# Patient Record
Sex: Male | Born: 1955 | Race: White | Hispanic: No | Marital: Married | Smoking: Current every day smoker
Health system: Southern US, Community
[De-identification: ages and names within clinical notes are randomized; demographics above are authoritative.]

## PROBLEM LIST (undated history)

## (undated) DIAGNOSIS — J45909 Unspecified asthma, uncomplicated: Secondary | ICD-10-CM

## (undated) DIAGNOSIS — G8929 Other chronic pain: Secondary | ICD-10-CM

## (undated) DIAGNOSIS — K219 Gastro-esophageal reflux disease without esophagitis: Secondary | ICD-10-CM

## (undated) DIAGNOSIS — I480 Paroxysmal atrial fibrillation: Secondary | ICD-10-CM

## (undated) DIAGNOSIS — R490 Dysphonia: Secondary | ICD-10-CM

## (undated) DIAGNOSIS — J449 Chronic obstructive pulmonary disease, unspecified: Secondary | ICD-10-CM

## (undated) DIAGNOSIS — D735 Infarction of spleen: Secondary | ICD-10-CM

## (undated) DIAGNOSIS — N433 Hydrocele, unspecified: Secondary | ICD-10-CM

## (undated) DIAGNOSIS — R001 Bradycardia, unspecified: Secondary | ICD-10-CM

## (undated) DIAGNOSIS — K635 Polyp of colon: Secondary | ICD-10-CM

## (undated) DIAGNOSIS — I236 Thrombosis of atrium, auricular appendage, and ventricle as current complications following acute myocardial infarction: Secondary | ICD-10-CM

## (undated) DIAGNOSIS — I2109 ST elevation (STEMI) myocardial infarction involving other coronary artery of anterior wall: Secondary | ICD-10-CM

## (undated) DIAGNOSIS — E785 Hyperlipidemia, unspecified: Secondary | ICD-10-CM

## (undated) DIAGNOSIS — L821 Other seborrheic keratosis: Secondary | ICD-10-CM

## (undated) DIAGNOSIS — K2270 Barrett's esophagus without dysplasia: Secondary | ICD-10-CM

## (undated) DIAGNOSIS — I5020 Unspecified systolic (congestive) heart failure: Secondary | ICD-10-CM

## (undated) DIAGNOSIS — R251 Tremor, unspecified: Secondary | ICD-10-CM

## (undated) DIAGNOSIS — I2510 Atherosclerotic heart disease of native coronary artery without angina pectoris: Secondary | ICD-10-CM

## (undated) DIAGNOSIS — I4891 Unspecified atrial fibrillation: Secondary | ICD-10-CM

## (undated) DIAGNOSIS — M791 Myalgia, unspecified site: Secondary | ICD-10-CM

## (undated) DIAGNOSIS — I739 Peripheral vascular disease, unspecified: Secondary | ICD-10-CM

## (undated) DIAGNOSIS — M5136 Other intervertebral disc degeneration, lumbar region: Secondary | ICD-10-CM

## (undated) DIAGNOSIS — M549 Dorsalgia, unspecified: Secondary | ICD-10-CM

## (undated) DIAGNOSIS — F341 Dysthymic disorder: Secondary | ICD-10-CM

## (undated) DIAGNOSIS — M1990 Unspecified osteoarthritis, unspecified site: Secondary | ICD-10-CM

## (undated) DIAGNOSIS — M51369 Other intervertebral disc degeneration, lumbar region without mention of lumbar back pain or lower extremity pain: Secondary | ICD-10-CM

## (undated) DIAGNOSIS — L570 Actinic keratosis: Secondary | ICD-10-CM

## (undated) DIAGNOSIS — F17200 Nicotine dependence, unspecified, uncomplicated: Secondary | ICD-10-CM

## (undated) DIAGNOSIS — I499 Cardiac arrhythmia, unspecified: Secondary | ICD-10-CM

## (undated) DIAGNOSIS — I255 Ischemic cardiomyopathy: Secondary | ICD-10-CM

## (undated) HISTORY — DX: Peripheral vascular disease, unspecified: I73.9

## (undated) HISTORY — DX: Unspecified systolic (congestive) heart failure: I50.20

## (undated) HISTORY — DX: Infarction of spleen: D73.5

## (undated) HISTORY — DX: Dorsalgia, unspecified: M54.9

## (undated) HISTORY — DX: Actinic keratosis: L57.0

## (undated) HISTORY — DX: Other intervertebral disc degeneration, lumbar region without mention of lumbar back pain or lower extremity pain: M51.369

## (undated) HISTORY — PX: COLONOSCOPY: SHX174

## (undated) HISTORY — DX: Atherosclerotic heart disease of native coronary artery without angina pectoris: I25.10

## (undated) HISTORY — DX: Ischemic cardiomyopathy: I25.5

## (undated) HISTORY — DX: Myalgia, unspecified site: M79.10

## (undated) HISTORY — DX: Hyperlipidemia, unspecified: E78.5

## (undated) HISTORY — DX: Barrett's esophagus without dysplasia: K22.70

## (undated) HISTORY — DX: Dysphonia: R49.0

## (undated) HISTORY — DX: Polyp of colon: K63.5

## (undated) HISTORY — DX: Hydrocele, unspecified: N43.3

## (undated) HISTORY — DX: Nicotine dependence, unspecified, uncomplicated: F17.200

## (undated) HISTORY — DX: Tremor, unspecified: R25.1

## (undated) HISTORY — DX: Unspecified asthma, uncomplicated: J45.909

## (undated) HISTORY — DX: Chronic obstructive pulmonary disease, unspecified: J44.9

## (undated) HISTORY — PX: NASAL SINUS SURGERY: SHX719

## (undated) HISTORY — DX: Dysthymic disorder: F34.1

## (undated) HISTORY — DX: Other seborrheic keratosis: L82.1

## (undated) HISTORY — DX: Gastro-esophageal reflux disease without esophagitis: K21.9

## (undated) HISTORY — DX: Other chronic pain: G89.29

## (undated) HISTORY — DX: Unspecified atrial fibrillation: I48.91

## (undated) HISTORY — DX: Other intervertebral disc degeneration, lumbar region: M51.36

## (undated) HISTORY — DX: Bradycardia, unspecified: R00.1

## (undated) HISTORY — PX: AORTOILIAC BYPASS: SHX6417

## (undated) HISTORY — DX: Unspecified osteoarthritis, unspecified site: M19.90

---
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
## 2005-04-07 ENCOUNTER — Emergency Department: Payer: Self-pay | Admitting: Unknown Physician Specialty

## 2017-01-17 HISTORY — PX: SPLENECTOMY: SUR1306

## 2017-02-06 DIAGNOSIS — R578 Other shock: Secondary | ICD-10-CM | POA: Insufficient documentation

## 2017-02-06 DIAGNOSIS — E872 Acidosis, unspecified: Secondary | ICD-10-CM | POA: Insufficient documentation

## 2017-02-08 DIAGNOSIS — S3609XA Other injury of spleen, initial encounter: Secondary | ICD-10-CM | POA: Insufficient documentation

## 2017-05-20 ENCOUNTER — Encounter (INDEPENDENT_AMBULATORY_CARE_PROVIDER_SITE_OTHER): Payer: Self-pay

## 2017-05-20 ENCOUNTER — Encounter: Payer: Self-pay | Admitting: Gastroenterology

## 2017-05-20 ENCOUNTER — Ambulatory Visit (INDEPENDENT_AMBULATORY_CARE_PROVIDER_SITE_OTHER): Payer: Non-veteran care | Admitting: Gastroenterology

## 2017-05-20 ENCOUNTER — Other Ambulatory Visit: Payer: Self-pay

## 2017-05-20 VITALS — BP 113/62 | HR 73 | Temp 98.7°F | Ht 75.0 in | Wt 178.4 lb

## 2017-05-20 DIAGNOSIS — R1013 Epigastric pain: Secondary | ICD-10-CM | POA: Diagnosis not present

## 2017-05-20 NOTE — Progress Notes (Addendum)
[name], MD [address], [place], [place], [place], [address] [address], [place], [place], [place], [address] Phone: [phone]  Fax: [phone]  Consultation  Referring Provider:     No ref. provider found Primary Care Physician:  [facility] [place] Primary Gastroenterologist:  Dr. Bonna Gains         Reason for Referral:     Abdominal Pain  Date of Consultation:  05/20/2017         HPI:   Brian Mcclure is a 61 y.o. male presents with abdominal pain of 4 month duration. It is located in the midpepigastric region, 3/10 in severity (8/10 when admitted to the hospital in Aug), dull in quality. Pt. 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 The pain was severe just prior to that admission but now he describes more of a dyspepsia intermittently with large meals. He takes one zantac at night that helps. No N/V, weight loss, melena, hematochezia, dysphagia and no alarm symptoms. 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 Had a colonoscopy 11 yrs ago that he states was normal. Reports a brother diagnosed with rectal or anal cancer.   Past Medical History:  Diagnosis Date  . Actinic keratosis   . Atrial fibrillation ([place])   . Chronic back pain   . COPD (chronic obstructive pulmonary disease) ([place])   . Degeneration of lumbar intervertebral disc   . Dysphonia   . Dysthymia   . GERD (gastroesophageal reflux disease)   . Hydrocele   . Hyperlipidemia   . Myalgia   . Osteoarthritis    C-spine  . Peripheral vascular disease ([place])   . Reactive airway disease   . Seborrheic keratosis   . Tobacco use disorder   . Tremor     History reviewed. No pertinent surgical history.  Prior to Admission medications   Medication Sig Start Date End Date Taking? Authorizing Provider  Cholecalciferol  (VITAMIN D-3) 1000 units CAPS Take by mouth 2 (two) times daily.   Yes [provider]  metoprolol tartrate (LOPRESSOR) 25 MG tablet Take 25 mg by mouth 2 (two) times daily.   Yes [provider]  varenicline (CHANTIX) 1 MG tablet Take 1 mg by mouth 2 (two) times daily.   Yes [provider]  acetaminophen (TYLENOL) 325 MG tablet Take 650 mg by mouth every 8 (eight) hours as needed.    [provider]  flecainide (TAMBOCOR) 100 MG tablet Take 100 mg by mouth 2 (two) times daily.    [provider]  gabapentin (NEURONTIN) 100 MG capsule Take 100 mg by mouth 3 (three) times daily.    [provider]  pravastatin (PRAVACHOL) 40 MG tablet Take 40 mg by mouth daily.    [provider]  ranitidine (ZANTAC) 150 MG tablet Take 150 mg by mouth 2 (two) times daily.    [provider]  rosuvastatin (CRESTOR) 40 MG tablet Take 40 mg by mouth daily.    [provider]    History reviewed. No pertinent family history.   Social History  Substance Use Topics  . Smoking status: Current Every Day Smoker    Packs/day: 1.00    Types: Cigarettes  . Smokeless tobacco: Never Used  . Alcohol use Yes     Comment: rare  Allergies as of 05/20/2017 - Review Complete 05/20/2017  Allergen Reaction Noted  . Atorvastatin  05/20/2017  . Flunisolide  05/20/2017  . Nicotine polacrilex [nicotine]  05/20/2017  . Pravastatin  05/20/2017  . Wellbutrin [bupropion]  05/20/2017    Review of Systems:    All systems reviewed and negative except where noted in HPI.   Physical Exam:  Vital signs in last 24 hours: @VSRANGES @   General:   Pleasant, cooperative in NAD Head:  Normocephalic and atraumatic. Eyes:   No icterus.   Conjunctiva pink. PERRLA. Ears:  Normal auditory acuity. Neck:  Supple; no masses or thyroidomegaly Lungs: Respirations even and unlabored. Lungs clear to auscultation bilaterally.   No wheezes, crackles, or rhonchi.   Heart:  Regular rate and rhythm;  Without murmur, clicks, rubs or gallops Abdomen:  Soft, nondistended, nontender. Normal bowel sounds. No appreciable masses or hepatomegaly.  No rebound or guarding.  Rectal:  Not performed. Msk:  Symmetrical without gross deformities.  Strength 5/5 b/l LE  Extremities:  Without edema, cyanosis or clubbing. Neurologic:  Alert and oriented x3;  grossly normal neurologically. Skin:  Intact without significant lesions or rashes. Abdominal scar from surgery present Cervical Nodes:  No significant cervical adenopathy. Psych:  Alert and cooperative. Normal affect.  LAB RESULTS: No results for input(s): WBC, HGB, HCT, PLT in the last 72 hours. BMET No results for input(s): NA, K, CL, CO2, GLUCOSE, BUN, CREATININE, CALCIUM in the last 72 hours. LFT No results for input(s): PROT, ALBUMIN, AST, ALT, ALKPHOS, BILITOT, BILIDIR, IBILI in the last 72 hours. PT/INR No results for input(s): LABPROT, INR in the last 72 hours.  Scanned note from referring provider reviewed along with labs that show normal Hgb and liver enzymes on 04/13/17 STUDIES: No results found.    Impression / Plan:   Brian Mcclure is a 61 y.o. y/o male with symptoms of dyspepsia and recent splenectomy due to splenic infarct and bleeding in July 2018 with severe abdominal pain at that time during presentation that has improved since then  Pt is due for screening colonoscopy and an EGD to evaluate his dyspepsia and chronic reflux can be scheduled with his colonoscopy He is agreeable with this plan and our staff will work on getting authorization from the [place] for these. It has been 3 months since his surgery. We will allow for another 1-2 months post surgery to prevent any complications given his recent surgery.  No alarm symptoms present Denies NSAID use, encouraged to continue to avoid Continue zantac and educated on lifestyle modifications to prevent reflux Will order stool for H Pylori in the  meantime as well   Thank you for involving me in the care of this patient.      Virgel Manifold, MD  05/20/2017, 11:32 AM

## 2017-05-21 ENCOUNTER — Other Ambulatory Visit
Admission: RE | Admit: 2017-05-21 | Discharge: 2017-05-21 | Disposition: A | Discharge status: Home / Self Care | Payer: No Typology Code available for payment source | Source: Ambulatory Visit | Attending: Gastroenterology | Admitting: Gastroenterology

## 2017-05-21 DIAGNOSIS — R1013 Epigastric pain: Secondary | ICD-10-CM | POA: Insufficient documentation

## 2017-05-23 LAB — H. PYLORI ANTIGEN, STOOL: H. PYLORI STOOL AG, EIA: NEGATIVE

## 2017-05-28 ENCOUNTER — Telehealth: Payer: Self-pay

## 2017-05-28 NOTE — Telephone Encounter (Signed)
Tried contacting pt to inform of results. No voicemail available to leave message.

## 2017-05-28 NOTE — Telephone Encounter (Signed)
-----   Message from Virgel Manifold, MD sent at 05/27/2017  8:01 AM EST ----- Please let patient know, H. Pylori testing was negative. Colonoscopy and EGD in 3 months as planned.

## 2017-06-09 NOTE — Telephone Encounter (Signed)
Spoke with pt regarding results. Will need to submit "additional services form" to the [place] for approval for EGD and Colonoscopy.

## 2017-06-16 NOTE — Telephone Encounter (Signed)
-----   Message from Virgel Manifold, MD sent at 05/27/2017  8:01 AM EST ----- Please let patient know, H. Pylori testing was negative. Colonoscopy and EGD in 3 months as planned.

## 2017-06-16 NOTE — Telephone Encounter (Signed)
Pt notified. Waiting on [place] approval to schedule procedures.

## 2017-06-21 ENCOUNTER — Ambulatory Visit (INDEPENDENT_AMBULATORY_CARE_PROVIDER_SITE_OTHER): Payer: Non-veteran care | Admitting: Gastroenterology

## 2017-06-21 ENCOUNTER — Encounter: Payer: Self-pay | Admitting: Gastroenterology

## 2017-06-21 ENCOUNTER — Other Ambulatory Visit
Admission: RE | Admit: 2017-06-21 | Discharge: 2017-06-21 | Disposition: A | Discharge status: Home / Self Care | Payer: Non-veteran care | Source: Ambulatory Visit | Attending: Gastroenterology | Admitting: Gastroenterology

## 2017-06-21 ENCOUNTER — Encounter (INDEPENDENT_AMBULATORY_CARE_PROVIDER_SITE_OTHER): Payer: Self-pay

## 2017-06-21 VITALS — BP 115/65 | HR 51 | Temp 97.5°F | Ht 75.0 in | Wt 178.4 lb

## 2017-06-21 DIAGNOSIS — R1013 Epigastric pain: Secondary | ICD-10-CM

## 2017-06-21 DIAGNOSIS — R1084 Generalized abdominal pain: Secondary | ICD-10-CM | POA: Diagnosis not present

## 2017-06-21 LAB — CBC WITH DIFFERENTIAL/PLATELET
BASOS ABS: 0.1 10*3/uL (ref 0–0.1)
Basophils Relative: 1 %
EOS PCT: 4 %
Eosinophils Absolute: 0.2 10*3/uL (ref 0–0.7)
HEMATOCRIT: 49.1 % (ref 40.0–52.0)
Hemoglobin: 16.4 g/dL (ref 13.0–18.0)
LYMPHS ABS: 2.8 10*3/uL (ref 1.0–3.6)
LYMPHS PCT: 40 %
MCH: 29.1 pg (ref 26.0–34.0)
MCHC: 33.4 g/dL (ref 32.0–36.0)
MCV: 87.2 fL (ref 80.0–100.0)
MONO ABS: 0.9 10*3/uL (ref 0.2–1.0)
Monocytes Relative: 12 %
NEUTROS ABS: 3 10*3/uL (ref 1.4–6.5)
Neutrophils Relative %: 43 %
PLATELETS: 238 10*3/uL (ref 150–440)
RBC: 5.63 MIL/uL (ref 4.40–5.90)
RDW: 16.9 % — AB (ref 11.5–14.5)
WBC: 7.1 10*3/uL (ref 3.8–10.6)

## 2017-06-21 LAB — COMPREHENSIVE METABOLIC PANEL
ALBUMIN: 4.3 g/dL (ref 3.5–5.0)
ALT: 15 U/L — ABNORMAL LOW (ref 17–63)
ANION GAP: 10 (ref 5–15)
AST: 17 U/L (ref 15–41)
Alkaline Phosphatase: 73 U/L (ref 38–126)
BUN: 13 mg/dL (ref 6–20)
CHLORIDE: 105 mmol/L (ref 101–111)
CO2: 25 mmol/L (ref 22–32)
Calcium: 9.4 mg/dL (ref 8.9–10.3)
Creatinine, Ser: 0.92 mg/dL (ref 0.61–1.24)
GFR calc Af Amer: >60 mL/min (ref >60)
GFR calc non Af Amer: >60 mL/min (ref >60)
GLUCOSE: 107 mg/dL — AB (ref 65–99)
POTASSIUM: 4.6 mmol/L (ref 3.5–5.1)
SODIUM: 140 mmol/L (ref 135–145)
TOTAL PROTEIN: 7.5 g/dL (ref 6.5–8.1)
Total Bilirubin: 0.7 mg/dL (ref 0.3–1.2)

## 2017-06-21 LAB — PROTIME-INR
INR: 1
PROTHROMBIN TIME: 13.1 s (ref 11.4–15.2)

## 2017-06-21 LAB — SEDIMENTATION RATE: SED RATE: 3 mm/h (ref 0–20)

## 2017-06-21 NOTE — Progress Notes (Signed)
[name], MD [address] [place], [place], [place], [address] [address], [place], [place], [place], [address] Phone: [phone]  Fax: [phone] Follow-up  Referring Provider:     [facility] [place] Physician:  [facility] [place] Primary Gastroenterologist:  Virgel Manifold, MD        Reason for Consultation:     Abdominal pain          HPI:   Brian Mcclure is a 61 y.o. male presents for follow-up of abdominal pain.  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  Patient was last seen in our clinic on November 1, and that time pain was already improving compared to his hospital admission.  Plan was to schedule his screening colonoscopy since last one was 11 years ago, and also an EGD to evaluate dyspepsia.  Patient states pain is much improved since the hospital admission.  Normal appetite, no weight loss, no nausea or vomiting, no altered bowel habits or blood in stool. Reports abdominal pain after eating, eats small meals. No heartburn. Describes it as a dull midepigastric 5/10 pain since July 2018. No pain prior to this splenic infarct episode. No fever/ chills or dysphagia  Past Medical History:  Diagnosis Date  . Actinic keratosis   . Atrial fibrillation ([place])   . Chronic back pain   . COPD (chronic obstructive pulmonary disease) ([place])   . Degeneration of lumbar intervertebral disc   . Dysphonia   . Dysthymia   . GERD (gastroesophageal reflux disease)   . Hydrocele   . Hyperlipidemia   . Myalgia   . Osteoarthritis    C-spine  . Peripheral vascular disease ([place])   . Reactive airway disease   . Seborrheic keratosis   . Tobacco use disorder   . Tremor     No past surgical history on file.  Prior to Admission medications   Medication Sig Start Date End Date Taking? Authorizing Provider  acetaminophen  (TYLENOL) 325 MG tablet Take 650 mg by mouth every 8 (eight) hours as needed.    [provider]  Cholecalciferol (VITAMIN D-3) 1000 units CAPS Take by mouth 2 (two) times daily.    [provider]  flecainide (TAMBOCOR) 100 MG tablet Take 100 mg by mouth 2 (two) times daily.    [provider]  gabapentin (NEURONTIN) 100 MG capsule Take 100 mg by mouth 3 (three) times daily.    [provider]  metoprolol tartrate (LOPRESSOR) 25 MG tablet Take 25 mg by mouth 2 (two) times daily.    [provider]  pravastatin (PRAVACHOL) 40 MG tablet Take 40 mg by mouth daily.    [provider]  ranitidine (ZANTAC) 150 MG tablet Take 150 mg by mouth 2 (two) times daily.    [provider]  rosuvastatin (CRESTOR) 40 MG tablet Take 40 mg by mouth daily.    [provider]  varenicline (CHANTIX) 1 MG tablet Take 1 mg by mouth 2 (two) times daily.    [provider]    No family history on file.   Social History   Tobacco Use  . Smoking status: Current Every Day Smoker    Packs/day: 1.00    Types: Cigarettes  . Smokeless tobacco: Never Used  Substance Use Topics  . Alcohol use: Yes    Comment: rare   .  Drug use: No    Allergies as of 06/21/2017 - Review Complete 05/20/2017  Allergen Reaction Noted  . Atorvastatin  05/20/2017  . Flunisolide  05/20/2017  . Nicotine polacrilex [nicotine]  05/20/2017  . Pravastatin  05/20/2017  . Wellbutrin [bupropion]  05/20/2017    Review of Systems:    All systems reviewed and negative except where noted in HPI.   Physical Exam:  Vital signs in last 24 hours: Vitals:   06/21/17 0834  BP: 115/65  Pulse: (!) 51  Temp: (!) 97.5 F (36.4 C)  TempSrc: Oral  Weight: 80.9 kg (178 lb 6.4 oz)  Height: 6\' 3"  (1.905 m)     General:   Pleasant, cooperative in NAD Head:  Normocephalic and atraumatic. Eyes:   No icterus.   Conjunctiva pink. PERRLA. Ears:  Normal auditory  acuity. Neck:  Supple; no masses or thyroidomegaly Lungs: Respirations even and unlabored. Lungs clear to auscultation bilaterally.   No wheezes, crackles, or rhonchi.  Heart:  Regular rate and rhythm;  Without murmur, clicks, rubs or gallops Abdomen:  Soft, nondistended, nontender. Normal bowel sounds. No appreciable masses or hepatomegaly.  No rebound or guarding.  Neurologic:  Alert and oriented x3;  grossly normal neurologically. Skin:  Intact without significant lesions or rashes. Cervical Nodes:  No significant cervical adenopathy. Psych:  Alert and cooperative. Normal affect.  LAB RESULTS: Scanned lab reports from July 2018 reviewed.  STUDIES: CT abdomen scan reports reviewed from July 2018.   Impression / Plan:   Brian Mcclure is a 62 y.o. y/o male with dull midepigastric abdominal pain, since his splenic infarct in July 2018 with much improved pain since splenectomy in July 2018.  His abdominal pain could be postsurgical Patient is awaiting paperwork from the [place] for approval of EGD and colonoscopy We will also obtain CT scan prior to the procedures given his splenectomy in July 2018, with CT scan on July 29, after splenectomy, showing chronic mild colonic thickening at the hepatic flexure. We will also update lab work since we do not have one since July 2018. Patient does not have any altered bowel habits or blood in stool. Stool for H. pylori has been negative. If EGD and colonoscopy are negative, and pain continues, can consider ultrasound with mesenteric Doppler to evaluate abdominal vasculature, or CTA. Patient agreeable with above plan.  I have discussed alternative options, risks & benefits,  which include, but are not limited to, bleeding, infection, perforation,respiratory complication & drug reaction.  The patient agrees with this plan & written consent will be obtained.     Thank you for involving me in the care of this patient.       Virgel Manifold, MD   06/21/2017, 8:35 AM

## 2017-06-21 NOTE — Addendum Note (Signed)
Addended by: Peggye Ley on: 06/21/2017 10:47 AM   Modules accepted: Orders, SmartSet

## 2017-06-21 NOTE — Addendum Note (Signed)
Addended by: Peggye Ley on: 06/21/2017 09:39 AM   Modules accepted: Orders

## 2017-06-23 ENCOUNTER — Encounter: Payer: Self-pay | Admitting: *Deleted

## 2017-06-23 ENCOUNTER — Encounter: Payer: Self-pay | Admitting: Anesthesiology

## 2017-06-23 ENCOUNTER — Other Ambulatory Visit: Payer: Self-pay

## 2017-06-24 ENCOUNTER — Telehealth: Payer: Self-pay

## 2017-06-24 ENCOUNTER — Telehealth: Payer: Self-pay | Admitting: Gastroenterology

## 2017-06-24 NOTE — Telephone Encounter (Signed)
Pt was concerned that you wanted the CT scan prior to procedures. CT scan is on Monday and procedures on on Wednesday. He doesn't have 4 wheel drive and doesn't feel comfortable driving in the snow to the hospital. I advised him they will not cancel the CT scan due to the snow. Please advise if you want the CT scan first or does it matter?

## 2017-06-24 NOTE — Telephone Encounter (Signed)
Pts voicemail has bot been set up yet unable to leave message.  Will try again later today.  Thanks [employer]

## 2017-06-24 NOTE — Telephone Encounter (Signed)
Dr. Michele Mcalpine pt Patient left voice message that he is having a CT scan Monday and with the snow what should he do? Please call

## 2017-06-24 NOTE — Telephone Encounter (Signed)
-----   Message from Virgel Manifold, MD sent at 06/24/2017 12:08 PM EST ----- Please let patient know his labwork did not show any anemia, inflammatory marker was normal. We can proceed with CT, and procedure as planned.

## 2017-06-25 ENCOUNTER — Other Ambulatory Visit: Payer: Self-pay

## 2017-06-25 DIAGNOSIS — R1084 Generalized abdominal pain: Secondary | ICD-10-CM

## 2017-06-28 ENCOUNTER — Ambulatory Visit: Admission: RE | Admit: 2017-06-28 | Payer: No Typology Code available for payment source | Source: Ambulatory Visit

## 2017-06-30 ENCOUNTER — Ambulatory Visit: Admit: 2017-06-30 | Payer: No Typology Code available for payment source | Admitting: Gastroenterology

## 2017-06-30 SURGERY — ESOPHAGOGASTRODUODENOSCOPY (EGD) WITH PROPOFOL
Anesthesia: Choice

## 2017-07-07 ENCOUNTER — Ambulatory Visit
Admission: RE | Admit: 2017-07-07 | Discharge: 2017-07-07 | Disposition: A | Discharge status: Home / Self Care | Payer: No Typology Code available for payment source | Source: Ambulatory Visit | Attending: Gastroenterology | Admitting: Gastroenterology

## 2017-07-07 DIAGNOSIS — Z9081 Acquired absence of spleen: Secondary | ICD-10-CM | POA: Diagnosis not present

## 2017-07-07 DIAGNOSIS — K429 Umbilical hernia without obstruction or gangrene: Secondary | ICD-10-CM | POA: Diagnosis not present

## 2017-07-07 DIAGNOSIS — D175 Benign lipomatous neoplasm of intra-abdominal organs: Secondary | ICD-10-CM | POA: Insufficient documentation

## 2017-07-07 DIAGNOSIS — R1084 Generalized abdominal pain: Secondary | ICD-10-CM | POA: Insufficient documentation

## 2017-07-07 MED ORDER — IOPAMIDOL (ISOVUE-300) INJECTION 61%
100.0000 mL | Freq: Once | INTRAVENOUS | Status: AC | PRN
  Administered 2017-07-07: 100 mL via INTRAVENOUS

## 2017-07-09 ENCOUNTER — Telehealth: Payer: Self-pay

## 2017-07-09 NOTE — Telephone Encounter (Signed)
Patient has been informed results as follows:   1. CT scan but there are the following findings that need follow up.   2. Areas of fat necrosis were seen and are likely related to his recent surgery related to his spleen. He should follow up with General surgery in this regard.   3. 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 These are benign lesions. However, it needs follow up with surgery to see if this would require resection in the future or can be followed over time with imaging. If he is following with surgeons at the [facility], they would be the one to see him in this regard.   4. Please contact his surgeons and forward them his CT report and referral for the "fat necrosis" and "lipoma" seen on his CT scan please.   I will forward results to patients PCP Dr. Lou Miner at [facility] for their office to arrange surgery follow up.  Dr. Lou Miner  Fax# [phone] Office# [phone]

## 2017-07-09 NOTE — Telephone Encounter (Signed)
Pt notified of the above results.  Results have been faxed to his PCP to arrange surgery follow up.  Thanks [employer]

## 2017-07-09 NOTE — Telephone Encounter (Signed)
  Results requested for CT please.  Patient informed me that he doesn't think the Plen-Vue bowel prep is working as it should.  He was scheduled today for his colonoscopy.  He said that he was just getting loose stools.  I explained that this is what is supposed to be happening and he should continue with the second prep.  He insisted that its not working and wanted to try a different prep.  Since he is having problems with Plen Vue Iwhich works similar to the [employer], I will call in Golytely or Nulytely which is simpler for him.  Pt has been explained how to go about the new prep and I will call it in to the pharmacy.  Thanks [employer]

## 2017-07-09 NOTE — Telephone Encounter (Signed)
Rx for Golytely has been called in to pharmacy with instructions as follows:  Begin drinking on January 16th evening time drink 8 oz every 32min until bowels are clear.  Thanks [employer]

## 2017-07-30 ENCOUNTER — Telehealth: Payer: Self-pay

## 2017-07-30 NOTE — Telephone Encounter (Signed)
[facility] to confirm they received patients CT Results and message regarding results. Fax was received.

## 2017-08-02 ENCOUNTER — Telehealth: Payer: Self-pay | Admitting: Gastroenterology

## 2017-08-02 NOTE — Telephone Encounter (Signed)
Brian Mcclure WITH THE [facility] CALLED, ASKING FOR TO CALL HIM AT [phone].THIS IS A CALL CENTER,SO ASK FOR Brian Mcclure.

## 2017-08-04 NOTE — Telephone Encounter (Signed)
Was on hold for 10 min. Will try again later.

## 2017-08-05 ENCOUNTER — Ambulatory Visit
Admission: RE | Admit: 2017-08-05 | Discharge: 2017-08-05 | Disposition: A | Discharge status: Home / Self Care | Payer: Non-veteran care | Source: Ambulatory Visit | Attending: Gastroenterology | Admitting: Gastroenterology

## 2017-08-05 ENCOUNTER — Ambulatory Visit: Payer: Non-veteran care | Admitting: Anesthesiology

## 2017-08-05 ENCOUNTER — Encounter
Admission: RE | Disposition: A | Discharge status: Home / Self Care | Payer: Self-pay | Source: Ambulatory Visit | Attending: Gastroenterology

## 2017-08-05 ENCOUNTER — Encounter: Payer: Self-pay | Admitting: *Deleted

## 2017-08-05 DIAGNOSIS — Z79899 Other long term (current) drug therapy: Secondary | ICD-10-CM | POA: Diagnosis not present

## 2017-08-05 DIAGNOSIS — K210 Gastro-esophageal reflux disease with esophagitis: Secondary | ICD-10-CM | POA: Diagnosis not present

## 2017-08-05 DIAGNOSIS — Z800 Family history of malignant neoplasm of digestive organs: Secondary | ICD-10-CM | POA: Diagnosis not present

## 2017-08-05 DIAGNOSIS — K649 Unspecified hemorrhoids: Secondary | ICD-10-CM | POA: Diagnosis not present

## 2017-08-05 DIAGNOSIS — K3189 Other diseases of stomach and duodenum: Secondary | ICD-10-CM | POA: Diagnosis not present

## 2017-08-05 DIAGNOSIS — Z888 Allergy status to other drugs, medicaments and biological substances status: Secondary | ICD-10-CM | POA: Diagnosis not present

## 2017-08-05 DIAGNOSIS — K5730 Diverticulosis of large intestine without perforation or abscess without bleeding: Secondary | ICD-10-CM

## 2017-08-05 DIAGNOSIS — D125 Benign neoplasm of sigmoid colon: Secondary | ICD-10-CM | POA: Diagnosis not present

## 2017-08-05 DIAGNOSIS — F17210 Nicotine dependence, cigarettes, uncomplicated: Secondary | ICD-10-CM | POA: Diagnosis not present

## 2017-08-05 DIAGNOSIS — R1084 Generalized abdominal pain: Secondary | ICD-10-CM

## 2017-08-05 DIAGNOSIS — Z1211 Encounter for screening for malignant neoplasm of colon: Secondary | ICD-10-CM

## 2017-08-05 DIAGNOSIS — I4891 Unspecified atrial fibrillation: Secondary | ICD-10-CM | POA: Insufficient documentation

## 2017-08-05 DIAGNOSIS — I739 Peripheral vascular disease, unspecified: Secondary | ICD-10-CM | POA: Insufficient documentation

## 2017-08-05 DIAGNOSIS — J449 Chronic obstructive pulmonary disease, unspecified: Secondary | ICD-10-CM | POA: Diagnosis not present

## 2017-08-05 DIAGNOSIS — K228 Other specified diseases of esophagus: Secondary | ICD-10-CM

## 2017-08-05 DIAGNOSIS — K2950 Unspecified chronic gastritis without bleeding: Secondary | ICD-10-CM | POA: Diagnosis not present

## 2017-08-05 DIAGNOSIS — K635 Polyp of colon: Secondary | ICD-10-CM

## 2017-08-05 DIAGNOSIS — K2289 Other specified disease of esophagus: Secondary | ICD-10-CM

## 2017-08-05 HISTORY — PX: COLONOSCOPY WITH PROPOFOL: SHX5780

## 2017-08-05 HISTORY — PX: ESOPHAGOGASTRODUODENOSCOPY (EGD) WITH PROPOFOL: SHX5813

## 2017-08-05 HISTORY — DX: Cardiac arrhythmia, unspecified: I49.9

## 2017-08-05 SURGERY — COLONOSCOPY WITH PROPOFOL
Anesthesia: General

## 2017-08-05 MED ORDER — LIDOCAINE HCL (PF) 2 % IJ SOLN
INTRAMUSCULAR | Status: AC
  Filled 2017-08-05: qty 10

## 2017-08-05 MED ORDER — PROPOFOL 10 MG/ML IV BOLUS
INTRAVENOUS | Status: DC | PRN
  Administered 2017-08-05: 39.3 mg via INTRAVENOUS

## 2017-08-05 MED ORDER — PROPOFOL 500 MG/50ML IV EMUL
INTRAVENOUS | Status: DC | PRN
  Administered 2017-08-05: 120 ug/kg/min via INTRAVENOUS

## 2017-08-05 MED ORDER — SODIUM CHLORIDE 0.9 % IV SOLN
INTRAVENOUS | Status: DC
  Administered 2017-08-05 (×2): via INTRAVENOUS

## 2017-08-05 MED ORDER — PROPOFOL 500 MG/50ML IV EMUL
INTRAVENOUS | Status: AC
  Filled 2017-08-05: qty 50

## 2017-08-05 NOTE — H&P (Signed)
[name], MD [address] [place], [place], [place], [address] [address], [place], [place], [place], [address] Phone: [phone]  Fax: [phone]  Primary Care Physician:  [place]   Pre-Procedure History & Physical: HPI:  Brian Mcclure is a 62 y.o. male is here for an EGD and colonoscopy.   Past Medical History:  Diagnosis Date  . Actinic keratosis   . Atrial fibrillation ([place])   . Chronic back pain   . COPD (chronic obstructive pulmonary disease) ([place])   . Degeneration of lumbar intervertebral disc   . Dysphonia   . Dysrhythmia    Atrial Fibrillation  . Dysthymia   . GERD (gastroesophageal reflux disease)   . Hydrocele   . Hyperlipidemia   . Myalgia   . Osteoarthritis    C-spine  . Peripheral vascular disease ([place])   . Reactive airway disease   . Seborrheic keratosis   . Tobacco use disorder   . Tremor     Past Surgical History:  Procedure Laterality Date  . [facility]  . COLONOSCOPY    . NASAL SINUS SURGERY     several  . SPLENECTOMY  01/2017    Prior to Admission medications   Medication Sig Start Date End Date Taking? Authorizing Provider  Cholecalciferol (VITAMIN D-3) 1000 units CAPS Take by mouth 2 (two) times daily.   Yes [provider]  metoprolol tartrate (LOPRESSOR) 25 MG tablet Take 25 mg by mouth 2 (two) times daily as needed (Takes when his atrial fibrillation 'acts up'). Takes prn   Yes [provider]  ranitidine (ZANTAC) 150 MG tablet Take 150 mg by mouth 2 (two) times daily. Takes prn   Yes [provider]  varenicline (CHANTIX) 1 MG tablet Take 1 mg by mouth 2 (two) times daily.    [provider]    Allergies as of 06/28/2017 - Review Complete 06/23/2017  Allergen Reaction Noted  . Statins Other (See Comments) 06/21/2017  . Flunisolide  05/20/2017  . Nicotine polacrilex [nicotine]  05/20/2017  . Wellbutrin [bupropion]  05/20/2017     History reviewed. No pertinent family history.  Social History   Socioeconomic History  . Marital status: Married    Spouse name: Not on file  . Number of children: Not on file  . Years of education: Not on file  . Highest education level: Not on file  Social Needs  . Financial resource strain: Not on file  . Food insecurity - worry: Not on file  . Food insecurity - inability: Not on file  . Transportation needs - medical: Not on file  . Transportation needs - non-medical: Not on file  Occupational History  . Not on file  Tobacco Use  . Smoking status: Current Every Day Smoker    Packs/day: 1.00    Years: 40.00    Pack years: 40.00    Types: Cigarettes  . Smokeless tobacco: Never Used  Substance and Sexual Activity  . Alcohol use: Yes    Comment: rare - 1-2x/yr  . Drug use: No  . Sexual activity: Yes  Other Topics Concern  . Not on file  Social History Narrative  . Not on file    Review of Systems: See HPI, otherwise negative ROS  Physical Exam: BP 130/70   Pulse (!) 57   Temp (!) 97.5 F (36.4 C) (Tympanic)   Resp 14   Ht 6\' 3"  (1.905 m)   Wt 173 lb (  78.5 kg)   SpO2 100%   BMI 21.62 kg/m  General:   Alert,  pleasant and cooperative in NAD Head:  Normocephalic and atraumatic. Neck:  Supple; no masses or thyromegaly. Lungs:  Clear throughout to auscultation, normal respiratory effort.    Heart:  +S1, +S2, Regular rate and rhythm, No edema. Abdomen:  Soft, nontender and nondistended. Normal bowel sounds, without guarding, and without rebound.   Neurologic:  Alert and  oriented x4;  grossly normal neurologically.  Impression/Plan: Brian Mcclure is here for an EGD and colonoscopy to be performed for abdominal pain   Risks, benefits, limitations, and alternatives regarding  colonoscopy have been reviewed with the patient.  Questions have been answered.  All parties agreeable.   Virgel Manifold, MD  08/05/2017, 8:09 AM

## 2017-08-05 NOTE — Anesthesia Postprocedure Evaluation (Signed)
Anesthesia Post Note  Patient: Brian Mcclure  Procedure(s) Performed: COLONOSCOPY WITH PROPOFOL (N/A ) ESOPHAGOGASTRODUODENOSCOPY (EGD) WITH PROPOFOL (N/A )  Patient location during evaluation: Endoscopy Anesthesia Type: General Level of consciousness: awake and alert Pain management: pain level controlled Vital Signs Assessment: post-procedure vital signs reviewed and stable Respiratory status: spontaneous breathing and respiratory function stable Cardiovascular status: stable Anesthetic complications: no     Last Vitals:  Vitals:   08/05/17 0723 08/05/17 0925  BP: 130/70   Pulse: (!) 57   Resp: 14   Temp: (!) 36.4 C (!) (P) 36.2 C  SpO2: 100%     Last Pain:  Vitals:   08/05/17 0925  TempSrc: (P) Tympanic                 Yuvan Medinger K

## 2017-08-05 NOTE — Op Note (Signed)
[facility] Patient Name: Brian Mcclure Procedure Date: 08/05/2017 8:13 AM MRN: [id number] Account #: [id number] Date of Birth: 1955-12-13 Admit Type: Outpatient Age: 62 Room: [facility] ENDO ROOM 3 Gender: Male Note Status: Finalized Procedure:            Upper GI endoscopy Indications:          Epigastric abdominal pain Providers:            Alfredia Desanctis B. Bonna Gains MD, MD Referring MD:         [facility], MD (Referring MD) Medicines:            Monitored Anesthesia Care Complications:        No immediate complications. Procedure:            Pre-Anesthesia Assessment:                       - The risks and benefits of the procedure and the                        sedation options and risks were discussed with the                        patient. All questions were answered and informed                        consent was obtained.                       - Patient identification and proposed procedure were                        verified prior to the procedure.                       - ASA Grade Assessment: III - A patient with severe                        systemic disease.                       After obtaining informed consent, the endoscope was                        passed under direct vision. Throughout the procedure,                        the patient's blood pressure, pulse, and oxygen                        saturations were monitored continuously. The Endoscope                        was introduced through the mouth, and advanced to the                        second part of duodenum. The upper GI endoscopy was                        accomplished with ease. The patient tolerated the  procedure well. Findings:      The Z-line was irregular and was found 37 cm from the incisors. Biopsies       were taken with a cold forceps for histology.      The examined esophagus was normal. This was biopsied with a cold forceps        for histology.      One tongue of salmon-colored mucosa was present. The maximum       longitudinal extent of these esophageal mucosal changes was 1 cm in       length. Biopsies were taken with a cold forceps for histology.      One localized, non-bleeding erosion was found in the gastric antrum.       There were no stigmata of recent bleeding. Biopsies were taken with a       cold forceps for histology.      Localized mild mucosal changes characterized by congestion were found in       the duodenal bulb. Biopsies were taken with a cold forceps for histology.      The second portion of the duodenum was normal. Impression:           - Z-line irregular, 37 cm from the incisors. Biopsied.                       - Normal esophagus. Biopsied.                       - Salmon-colored mucosa. Biopsied.                       - Non-bleeding erosive gastropathy. Biopsied.                       - Mucosal changes in the duodenum. Biopsied.                       - Normal second portion of the duodenum. Recommendation:       - Await pathology results.                       - Advance diet as tolerated.                       - Continue present medications.                       - Return to my office as previously scheduled.                       - Follow an antireflux regimen.                       - The findings and recommendations were discussed with                        the patient.                       - The findings and recommendations were discussed with                        the patient's family. Procedure Code(s):    --- Professional ---  25053, Esophagogastroduodenoscopy, flexible, transoral;                        with biopsy, single or multiple Diagnosis Code(s):    --- Professional ---                       K22.8, Other specified diseases of esophagus                       K31.89, Other diseases of stomach and duodenum                       R10.13, Epigastric  pain CPT copyright 2016 American Medical Association. All rights reserved. The codes documented in this report are preliminary and upon coder review may  be revised to meet current compliance requirements.  [name], MD Margretta Sidle B. Bonna Gains MD, MD 08/05/2017 8:39:38 AM This report has been signed electronically. Number of Addenda: 0 Note Initiated On: 08/05/2017 8:13 AM      [facility]

## 2017-08-05 NOTE — Anesthesia Post-op Follow-up Note (Signed)
Anesthesia QCDR form completed.        

## 2017-08-05 NOTE — Anesthesia Preprocedure Evaluation (Addendum)
Anesthesia Evaluation  Patient identified by MRN, date of birth, ID band Patient awake    Reviewed: Allergy & Precautions, NPO status , Patient's Chart, lab work & pertinent test results, reviewed documented beta blocker date and time   History of Anesthesia Complications Negative for: history of anesthetic complications  Airway Mallampati: II       Dental   Pulmonary COPD, Current Smoker,           Cardiovascular (-) hypertension+ Peripheral Vascular Disease  (-) Past MI + dysrhythmias Atrial Fibrillation (-) Valvular Problems/Murmurs     Neuro/Psych neg Seizures Depression    GI/Hepatic Neg liver ROS, GERD  Medicated,  Endo/Other  neg diabetes  Renal/GU negative Renal ROS     Musculoskeletal   Abdominal   Peds  Hematology   Anesthesia Other Findings   Reproductive/Obstetrics                            Anesthesia Physical Anesthesia Plan  ASA: III  Anesthesia Plan: General   Post-op Pain Management:    Induction: Intravenous  PONV Risk Score and Plan: 1 and Propofol infusion and TIVA  Airway Management Planned: Nasal Cannula  Additional Equipment:   Intra-op Plan:   Post-operative Plan:   Informed Consent: I have reviewed the patients History and Physical, chart, labs and discussed the procedure including the risks, benefits and alternatives for the proposed anesthesia with the patient or authorized representative who has indicated his/her understanding and acceptance.     Plan Discussed with:   Anesthesia Plan Comments:         Anesthesia Quick Evaluation

## 2017-08-05 NOTE — Op Note (Signed)
[facility] Patient Name: Brian Mcclure Procedure Date: 08/05/2017 8:12 AM MRN: [id number] Account #: [id number] Date of Birth: 1955-12-09 Admit Type: Outpatient Age: 62 Room: [facility] ENDO ROOM 3 Gender: Male Note Status: Finalized Procedure:            Colonoscopy Indications:          Screening for colorectal malignant neoplasm. Brother                        with rectal or anal cancer Providers:            Munir Victorian B. Bonna Gains MD, MD Referring MD:         [facility], MD (Referring MD) Medicines:            Monitored Anesthesia Care Complications:        No immediate complications. Procedure:            Pre-Anesthesia Assessment:                       - ASA Grade Assessment: III - A patient with severe                        systemic disease.                       - Prior to the procedure, a History and Physical was                        performed, and patient medications, allergies and                        sensitivities were reviewed. The patient's tolerance of                        previous anesthesia was reviewed.                       - The risks and benefits of the procedure and the                        sedation options and risks were discussed with the                        patient. All questions were answered and informed                        consent was obtained.                       - Patient identification and proposed procedure were                        verified prior to the procedure by the physician, the                        nurse, the anesthesiologist, the anesthetist and the                        technician. The procedure was verified in the procedure  room.                       After obtaining informed consent, the colonoscope was                        passed under direct vision. Throughout the procedure,                        the patient's blood pressure, pulse, and oxygen                         saturations were monitored continuously. The                        Colonoscope was introduced through the anus and                        advanced to the the cecum, identified by appendiceal                        orifice and ileocecal valve. The colonoscopy was                        performed with ease. The patient tolerated the                        procedure well. The quality of the bowel preparation                        was fair. Findings:      The perianal and digital rectal examinations were normal.      A 5 mm polyp was found in the sigmoid colon. The polyp was sessile.       Polypectomy was attempted, initially using a cold snare. Polyp resection       was incomplete with this device. This intervention then required a       different device and polypectomy technique. The polyp was removed with a       cold biopsy forceps. Resection and retrieval were complete.      Multiple small-mouthed diverticula were found in the sigmoid colon.      Non-bleeding hemorrhoids were found during endoscopy. The hemorrhoids       were small.      The exam was otherwise without abnormality.      The retroflexed view of the distal rectum and anal verge was normal and       showed no anal or rectal abnormalities. Impression:           - Preparation of the colon was fair.                       - One 5 mm polyp in the sigmoid colon, removed with a                        cold biopsy forceps. Resected and retrieved.                       - Diverticulosis in the sigmoid colon.                       -  Non-bleeding hemorrhoids.                       - The examination was otherwise normal.                       - The distal rectum and anal verge are normal on                        retroflexion view. Recommendation:       - Discharge patient to home (with escort).                       - Advance diet as tolerated.                       - Continue present medications.                        - Await pathology results.                       - Repeat colonoscopy date to be determined after                        pending pathology results are reviewed for                        surveillance. (Pt has family history of rectal/anal                        cancer and surveillance interval will atleast be 5 yrs)                       - The findings and recommendations were discussed with                        the patient.                       - The findings and recommendations were discussed with                        the patient's family.                       - Return to primary care physician as previously                        scheduled. Procedure Code(s):    --- Professional ---                       [phone], Colonoscopy, flexible; with biopsy, single or                        multiple Diagnosis Code(s):    --- Professional ---                       Z12.11, Encounter for screening for malignant neoplasm                        of colon  K64.9, Unspecified hemorrhoids                       D12.5, Benign neoplasm of sigmoid colon                       K57.30, Diverticulosis of large intestine without                        perforation or abscess without bleeding CPT copyright 2016 American Medical Association. All rights reserved. The codes documented in this report are preliminary and upon coder review may  be revised to meet current compliance requirements.  [name], MD Margretta Sidle B. Bonna Gains MD, MD 08/05/2017 9:27:16 AM This report has been signed electronically. Number of Addenda: 0 Note Initiated On: 08/05/2017 8:12 AM Scope Withdrawal Time: 0 hours 27 minutes 25 seconds  Total Procedure Duration: 0 hours 39 minutes 10 seconds  Estimated Blood Loss: Estimated blood loss: none.      [facility]

## 2017-08-05 NOTE — Transfer of Care (Signed)
Immediate Anesthesia Transfer of Care Note  Patient: Brian Mcclure  Procedure(s) Performed: COLONOSCOPY WITH PROPOFOL (N/A ) ESOPHAGOGASTRODUODENOSCOPY (EGD) WITH PROPOFOL (N/A )  Patient Location: PACU and Endoscopy Unit  Anesthesia Type:General  Level of Consciousness: sedated  Airway & Oxygen Therapy: Patient Spontanous Breathing  Post-op Assessment: Post -op Vital signs reviewed and stable  Post vital signs: stable  Last Vitals:  Vitals:   08/05/17 0723  BP: 130/70  Pulse: (!) 57  Resp: 14  Temp: (!) 36.4 C  SpO2: 100%    Last Pain:  Vitals:   08/05/17 0723  TempSrc: Tympanic         Complications: No apparent anesthesia complications

## 2017-08-06 ENCOUNTER — Encounter: Payer: Self-pay | Admitting: Gastroenterology

## 2017-08-06 LAB — SURGICAL PATHOLOGY

## 2017-09-13 ENCOUNTER — Ambulatory Visit: Payer: No Typology Code available for payment source | Admitting: Gastroenterology

## 2017-09-13 ENCOUNTER — Encounter (INDEPENDENT_AMBULATORY_CARE_PROVIDER_SITE_OTHER): Payer: Self-pay

## 2017-09-13 ENCOUNTER — Other Ambulatory Visit: Payer: Self-pay

## 2017-09-13 ENCOUNTER — Encounter: Payer: Self-pay | Admitting: Gastroenterology

## 2017-09-13 ENCOUNTER — Ambulatory Visit (INDEPENDENT_AMBULATORY_CARE_PROVIDER_SITE_OTHER): Payer: Non-veteran care | Admitting: Gastroenterology

## 2017-09-13 VITALS — BP 122/63 | HR 63 | Temp 98.1°F | Ht 75.0 in | Wt 170.8 lb

## 2017-09-13 DIAGNOSIS — R1013 Epigastric pain: Secondary | ICD-10-CM

## 2017-09-13 DIAGNOSIS — D175 Benign lipomatous neoplasm of intra-abdominal organs: Secondary | ICD-10-CM | POA: Diagnosis not present

## 2017-09-13 DIAGNOSIS — K2270 Barrett's esophagus without dysplasia: Secondary | ICD-10-CM

## 2017-09-13 DIAGNOSIS — G8929 Other chronic pain: Secondary | ICD-10-CM

## 2017-09-13 NOTE — Patient Instructions (Addendum)
1.F/U 6 months,  2. Buy bed wedge, and follow below recommendations for acid reflux. If heartburn or acid reflux continue, please inform us 3. Discuss having a Mesenteric Angiogram or Mesenteric Doppler with your Vascular Surgeon or PCP on your next appointment 4. Follow up with General surgery as scheduled in regard to the lipoma seen on CT in your small bowel, and the fat necrosis seen as well 5. See the FODMAP diet handout given to you, and avoid HIGH FODMAP foods to see if helps with your bloating  Gastroesophageal Reflux Disease, Adult Normally, food travels down the esophagus and stays in the stomach to be digested. If a person has gastroesophageal reflux disease (GERD), food and stomach acid move back up into the esophagus. When this happens, the esophagus becomes sore and swollen (inflamed). Over time, GERD can make small holes (ulcers) in the lining of the esophagus. Follow these instructions at home: Diet  Follow a diet as told by your doctor. You may need to avoid foods and drinks such as: ? Coffee and tea (with or without caffeine). ? Drinks that contain alcohol. ? Energy drinks and sports drinks. ? Carbonated drinks or sodas. ? Chocolate and cocoa. ? Peppermint and mint flavorings. ? Garlic and onions. ? Horseradish. ? Spicy and acidic foods, such as peppers, chili powder, curry powder, vinegar, hot sauces, and BBQ sauce. ? Citrus fruit juices and citrus fruits, such as oranges, lemons, and limes. ? Tomato-based foods, such as red sauce, chili, salsa, and pizza with red sauce. ? Fried and fatty foods, such as donuts, french fries, potato chips, and high-fat dressings. ? High-fat meats, such as hot dogs, rib eye steak, sausage, ham, and bacon. ? High-fat dairy items, such as whole milk, butter, and cream cheese.  Eat small meals often. Avoid eating large meals.  Avoid drinking large amounts of liquid with your meals.  Avoid eating meals during the 2-3 hours before  bedtime.  Avoid lying down right after you eat.  Do not exercise right after you eat. General instructions  Pay attention to any changes in your symptoms.  Take over-the-counter and prescription medicines only as told by your doctor. Do not take aspirin, ibuprofen, or other NSAIDs unless your doctor says it is okay.  Do not use any tobacco products, including cigarettes, chewing tobacco, and e-cigarettes. If you need help quitting, ask your doctor.  Wear loose clothes. Do not wear anything tight around your waist.  Raise (elevate) the head of your bed about 6 inches (15 cm).  Try to lower your stress. If you need help doing this, ask your doctor.  If you are overweight, lose an amount of weight that is healthy for you. Ask your doctor about a safe weight loss goal.  Keep all follow-up visits as told by your doctor. This is important. Contact a doctor if:  You have new symptoms.  You lose weight and you do not know why it is happening.  You have trouble swallowing, or it hurts to swallow.  You have wheezing or a cough that keeps happening.  Your symptoms do not get better with treatment.  You have a hoarse voice. Get help right away if:  You have pain in your arms, neck, jaw, teeth, or back.  You feel sweaty, dizzy, or light-headed.  You have chest pain or shortness of breath.  You throw up (vomit) and your throw up looks like blood or coffee grounds.  You pass out (faint).  Your poop (stool) is bloody  or black.  You cannot swallow, drink, or eat. This information is not intended to replace advice given to you by your health care provider. Make sure you discuss any questions you have with your health care provider. Document Released: 12/23/2007 Document Revised: 12/12/2015 Document Reviewed: 10/31/2014 Elsevier Interactive Patient Education  [employer].

## 2017-09-13 NOTE — Progress Notes (Signed)
Brian Antigua, MD [address]  [place], [place] [address]  Main: [phone]  Fax: [phone]   Primary Care Physician: [facility] [place]  Primary Gastroenterologist:  Dr. Vonda Mcclure  Chief Complaint  Patient presents with  . Follow-up    changes in esophageal tissues    HPI: Brian Mcclure is a 62 y.o. male who presents for follow-up of abdominal pain.  Abdominal pain has resolved.  At this time he reports bloating, flatulence, and belching after eating a heavy meal.  Reports heartburn 1-2 times a week.  No dysphagia.  No weight loss.  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  At that time he had severe abdominal pain when he went to the hospital.  Since then he has been followed up in our clinic due to abdominal pain after meals, dull midepigastric, 5/10.  This has resolved at this time and current symptoms are as per above.  He underwent EGD and colonoscopy on August 05, 2017.   Colonoscopy had a fair prep, showed a 5 mm sigmoid polyp that was removed and showed tubular adenoma, diverticulosis and internal hemorrhoids, otherwise normal.  EGD showed, a gastric erosion, duodenal bulb congestion, 1 cm salmon colored mucosa in the esophagus.  Biopsies showed minimal chronic gastritis, no H. 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    Current Outpatient Medications  Medication Sig Dispense Refill  . Cholecalciferol (VITAMIN D-3) 1000 units CAPS Take by mouth 2 (two) times daily.    . metoprolol tartrate (LOPRESSOR) 25 MG tablet Take 25 mg by mouth 2 (two) times daily as needed (Takes when his atrial fibrillation 'acts up'). Takes prn    . ranitidine (ZANTAC) 150 MG tablet Take 150 mg by mouth 2 (two) times daily. Takes prn     No current  facility-administered medications for this visit.     Allergies as of 09/13/2017 - Review Complete 09/13/2017  Allergen Reaction Noted  . Statins Other (See Comments) 06/21/2017  . Flunisolide Other (See Comments) 05/20/2017  . Nicotine polacrilex [nicotine] Other (See Comments) 05/20/2017  . Wellbutrin [bupropion] Other (See Comments) 05/20/2017    ROS:  General: Negative for anorexia, weight loss, fever, chills, fatigue, weakness. ENT: Negative for hoarseness, difficulty swallowing , nasal congestion. CV: Negative for chest pain, angina, palpitations, dyspnea on exertion, peripheral edema.  Respiratory: Negative for dyspnea at rest, dyspnea on exertion, cough, sputum, wheezing.  GI: See history of present illness. GU:  Negative for dysuria, hematuria, urinary incontinence, urinary frequency, nocturnal urination.  Endo: Negative for unusual weight change.    Physical Examination:   BP 122/63   Pulse 63   Temp 98.1 F (36.7 C) (Oral)   Ht 6\' 3"  (1.905 m)   Wt 170 lb 12.8 oz (77.5 kg)   BMI 21.35 kg/m   General: Well-nourished, well-developed in no acute distress.  Eyes: No icterus. Conjunctivae pink. Mouth: Oropharyngeal mucosa moist and pink , no lesions erythema or exudate. Neck: Supple, Trachea midline Pulmonary: CTA bilaterally, normal respiratory effort Abdomen: Bowel sounds are normal, nontender, nondistended, no hepatosplenomegaly or masses, no abdominal bruits or hernia , no rebound or guarding.   Extremities: No lower extremity edema. No clubbing or deformities. Neuro: Alert and oriented x 3.  Grossly intact. Skin: Warm and dry, no jaundice.   Psych: Alert and  cooperative, normal mood and affect.   Labs: CMP     Component Value Date/Time   NA 140 06/21/2017 1033   K 4.6 06/21/2017 1033   CL 105 06/21/2017 1033   CO2 25 06/21/2017 1033   GLUCOSE 107 (H) 06/21/2017 1033   BUN 13 06/21/2017 1033   CREATININE 0.92 06/21/2017 1033   CALCIUM 9.4 06/21/2017  1033   PROT 7.5 06/21/2017 1033   ALBUMIN 4.3 06/21/2017 1033   AST 17 06/21/2017 1033   ALT 15 (L) 06/21/2017 1033   ALKPHOS 73 06/21/2017 1033   BILITOT 0.7 06/21/2017 1033   GFRNONAA >60 06/21/2017 1033   GFRAA >60 06/21/2017 1033   Lab Results  Component Value Date   WBC 7.1 06/21/2017   HGB 16.4 06/21/2017   HCT 49.1 06/21/2017   MCV 87.2 06/21/2017   PLT 238 06/21/2017    Imaging Studies: No results found.  Assessment and Plan:   Brian Mcclure is a 62 y.o. y/o male here for follow-up of abdominal pain  Abdominal pain is resolved at this time The patient reports postprandial symptoms, which include belching, bloating, flatulence Would recommend evaluation for mesenteric ischemia with mesenteric Doppler or mesenteric angiogram. Patient follows up with vascular surgery at the [facility], due to history of ?Stenting for claudication I have asked him to inquire about ordering mesenteric angiogram or Doppler at the [facility] vascular surgeon or his PCP can order this He is seeing his vascular surgeon in 2 days, and will discuss this with them If he is unable to get this done there, we can order this outside the [place]  If this is negative, his symptoms might be related to high 5 map diet Handout for low fodmap versus high fodmap foods given to the patient, and he was asked to avoid high fodmap foods and see if it improves symptoms  He is hyper-astute to his symptoms, but so far the workup has not revealed any alarming findings.  And he was reassured about this.  He is following up with general surgery at the [place], in regard to the fat necrosis seen on his last CT scan.  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  He is not having any obstructive symptoms at this time.  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 Patient does alter his medications, and alter his medical care on his own We  discussed the finding of Barrett's, with no dysplasia, and discussed the risks of transformation into cancer is low, in the absence of dysplasia at this time.  However, we discussed that should be well controlled to avoid further any symptoms of acid reflux or heartburn progression. Patient would like to avoid PPI at this time, does not want to take it even for short-term He would rather take Zantac, which she is not taking at this time either We extensively discussed acid reflux lifestyle modifications, including buying a bed wedge Patient is willing to take Zantac for a few weeks to months.  He will start this now Heartburn is only occurring 1-2 times a week, if this worsens or does not improve he was asked to call us  We have discussed options of further surveillance with biopsies in 2-3 years versus no further surveillance.  He chooses to undergo EGD with repeat biopsies in 2-3 years.  His next colonoscopy is recommended in 3-5 years due to fair prep seen on last  procedure  Dr Brian Mcclure

## 2017-10-08 ENCOUNTER — Other Ambulatory Visit: Payer: Self-pay

## 2017-10-08 ENCOUNTER — Emergency Department: Payer: Non-veteran care

## 2017-10-08 ENCOUNTER — Emergency Department
Admission: EM | Admit: 2017-10-08 | Discharge: 2017-10-08 | Disposition: A | Discharge status: Home / Self Care | Payer: Non-veteran care | Attending: Emergency Medicine | Admitting: Emergency Medicine

## 2017-10-08 DIAGNOSIS — I4891 Unspecified atrial fibrillation: Secondary | ICD-10-CM

## 2017-10-08 DIAGNOSIS — Y939 Activity, unspecified: Secondary | ICD-10-CM | POA: Diagnosis not present

## 2017-10-08 DIAGNOSIS — J449 Chronic obstructive pulmonary disease, unspecified: Secondary | ICD-10-CM | POA: Insufficient documentation

## 2017-10-08 DIAGNOSIS — F17210 Nicotine dependence, cigarettes, uncomplicated: Secondary | ICD-10-CM | POA: Insufficient documentation

## 2017-10-08 DIAGNOSIS — W19XXXA Unspecified fall, initial encounter: Secondary | ICD-10-CM | POA: Insufficient documentation

## 2017-10-08 DIAGNOSIS — R42 Dizziness and giddiness: Secondary | ICD-10-CM | POA: Insufficient documentation

## 2017-10-08 DIAGNOSIS — R002 Palpitations: Secondary | ICD-10-CM | POA: Diagnosis present

## 2017-10-08 DIAGNOSIS — Y929 Unspecified place or not applicable: Secondary | ICD-10-CM | POA: Diagnosis not present

## 2017-10-08 DIAGNOSIS — Z79899 Other long term (current) drug therapy: Secondary | ICD-10-CM | POA: Diagnosis not present

## 2017-10-08 DIAGNOSIS — I480 Paroxysmal atrial fibrillation: Secondary | ICD-10-CM | POA: Insufficient documentation

## 2017-10-08 DIAGNOSIS — Y999 Unspecified external cause status: Secondary | ICD-10-CM | POA: Diagnosis not present

## 2017-10-08 LAB — CBC
HCT: 50.3 % (ref 40.0–52.0)
Hemoglobin: 16.7 g/dL (ref 13.0–18.0)
MCH: 30.3 pg (ref 26.0–34.0)
MCHC: 33.2 g/dL (ref 32.0–36.0)
MCV: 91.4 fL (ref 80.0–100.0)
PLATELETS: 278 10*3/uL (ref 150–440)
RBC: 5.51 MIL/uL (ref 4.40–5.90)
RDW: 15.3 % — AB (ref 11.5–14.5)
WBC: 13.2 10*3/uL — AB (ref 3.8–10.6)

## 2017-10-08 LAB — PROTIME-INR
INR: 0.98
Prothrombin Time: 12.9 seconds (ref 11.4–15.2)

## 2017-10-08 LAB — COMPREHENSIVE METABOLIC PANEL
ALBUMIN: 4.1 g/dL (ref 3.5–5.0)
ALT: 13 U/L — ABNORMAL LOW (ref 17–63)
ANION GAP: 8 (ref 5–15)
AST: 18 U/L (ref 15–41)
Alkaline Phosphatase: 69 U/L (ref 38–126)
BILIRUBIN TOTAL: 0.5 mg/dL (ref 0.3–1.2)
BUN: 14 mg/dL (ref 6–20)
CALCIUM: 9.5 mg/dL (ref 8.9–10.3)
CO2: 27 mmol/L (ref 22–32)
CREATININE: 1.09 mg/dL (ref 0.61–1.24)
Chloride: 104 mmol/L (ref 101–111)
GFR calc Af Amer: >60 mL/min (ref >60)
GFR calc non Af Amer: >60 mL/min (ref >60)
Glucose, Bld: 118 mg/dL — ABNORMAL HIGH (ref 65–99)
POTASSIUM: 4.3 mmol/L (ref 3.5–5.1)
SODIUM: 139 mmol/L (ref 135–145)
Total Protein: 7.3 g/dL (ref 6.5–8.1)

## 2017-10-08 LAB — TROPONIN I

## 2017-10-08 MED ORDER — METOPROLOL TARTRATE 5 MG/5ML IV SOLN
5.0000 mg | Freq: Once | INTRAVENOUS | Status: AC
  Administered 2017-10-08: 5 mg via INTRAVENOUS
  Filled 2017-10-08: qty 5

## 2017-10-08 MED ORDER — METOPROLOL TARTRATE 5 MG/5ML IV SOLN
2.5000 mg | Freq: Once | INTRAVENOUS | Status: AC
  Administered 2017-10-08: 2.5 mg via INTRAVENOUS
  Filled 2017-10-08: qty 5

## 2017-10-08 MED ORDER — METOPROLOL TARTRATE 25 MG PO TABS
25.0000 mg | ORAL_TABLET | Freq: Once | ORAL | Status: AC
  Administered 2017-10-08: 25 mg via ORAL
  Filled 2017-10-08: qty 1

## 2017-10-08 MED ORDER — SODIUM CHLORIDE 0.9 % IV BOLUS (SEPSIS)
1000.0000 mL | Freq: Once | INTRAVENOUS | Status: AC
  Administered 2017-10-08: 1000 mL via INTRAVENOUS

## 2017-10-08 NOTE — ED Notes (Signed)
Patient left for CT scan. 

## 2017-10-08 NOTE — Discharge Instructions (Addendum)
As we discussed if you remain in atrial fibrillation tomorrow or develop any shortness of breath or any chest pain please return to the emergency department immediately.

## 2017-10-08 NOTE — ED Notes (Signed)
Report given to Leanora Cover RN

## 2017-10-08 NOTE — ED Provider Notes (Signed)
[facility] Emergency Department Provider Note  Time seen: 3:49 PM  I have reviewed the triage vital signs and the nursing notes.   HISTORY  Chief Complaint Fall    HPI Brian Mcclure is a 62 y.o. male with a past medical history of paroxysmal atrial fibrillation, COPD, gastric reflux, hyperlipidemia, tobacco use, presents to the emergency department after a fall.  According to the patient several hours ago he states he was feeling palpitations like he might be going into atrial fibrillation, states he stood up again walking and felt very dizzy lightheaded, remembers reaching for the door and then remembers hearing his head hit the ground.  Denies any nausea or vomiting.  States he has felt a little lightheaded and continues to feel palpitations.  Denies any chest pain.  Denies trouble breathing.  Patient states he knows he is now in atrial fibrillation, states it comes and goes last time it occurred was several months ago.  States he usually takes metoprolol 25 mg tablet and that is enough to get him back out of atrial fibrillation per patient.  He has not taken any metoprolol today.  Denies any weakness or numbness confusion or slurred speech.  Overall patient appears extremely well, no distress answering questions without difficulty.   Past Medical History:  Diagnosis Date  . Actinic keratosis   . Atrial fibrillation ([place])   . Chronic back pain   . COPD (chronic obstructive pulmonary disease) ([place])   . Degeneration of lumbar intervertebral disc   . Dysphonia   . Dysrhythmia    Atrial Fibrillation  . Dysthymia   . GERD (gastroesophageal reflux disease)   . Hydrocele   . Hyperlipidemia   . Myalgia   . Osteoarthritis    C-spine  . Peripheral vascular disease ([place])   . Reactive airway disease   . Seborrheic keratosis   . Tobacco use disorder   . Tremor     Patient Active Problem List   Diagnosis Date Noted  . Abdominal pain, generalized   .  Stomach irritation   . Columnar epithelial-lined lower esophagus   . Special screening for malignant neoplasms, colon   . Polyp of sigmoid colon   . Diverticulosis of large intestine without diverticulitis   . Hemorrhoids without complication   . Splenic rupture 02/08/2017  . Hemorrhagic shock ([place]) 02/06/2017  . Hypocalcemia 02/06/2017  . Lactic acidosis 02/06/2017    Past Surgical History:  Procedure Laterality Date  . [facility]  [facility]    . COLONOSCOPY WITH PROPOFOL N/A 08/05/2017   Procedure: COLONOSCOPY WITH PROPOFOL;  Surgeon: Virgel Manifold, MD;  Location: [facility] ENDOSCOPY;  Service: Endoscopy;  Laterality: N/A;  . ESOPHAGOGASTRODUODENOSCOPY (EGD) WITH PROPOFOL N/A 08/05/2017   Procedure: ESOPHAGOGASTRODUODENOSCOPY (EGD) WITH PROPOFOL;  Surgeon: Virgel Manifold, MD;  Location: [facility] ENDOSCOPY;  Service: Endoscopy;  Laterality: N/A;  . NASAL SINUS SURGERY     several  . SPLENECTOMY  01/2017    Prior to Admission medications   Medication Sig Start Date End Date Taking? Authorizing Provider  Cholecalciferol (VITAMIN D-3) 1000 units CAPS Take by mouth 2 (two) times daily.    [provider]  metoprolol tartrate (LOPRESSOR) 25 MG tablet Take 25 mg by mouth 2 (two) times daily as needed (Takes when his atrial fibrillation 'acts up'). Takes prn    [provider]  ranitidine (ZANTAC) 150 MG tablet Take 150 mg by mouth 2 (two) times daily. Takes prn  [provider]    Allergies  Allergen Reactions  . Statins Other (See Comments)    Leg Cramps  . Flunisolide Other (See Comments)    didn't work  . Nicotine Polacrilex [Nicotine] Other (See Comments)    didn't help stop smoking  . Wellbutrin [Bupropion] Other (See Comments)    didn't help stop smoking, somnolent    No family history on file.  Social History Social History   Tobacco Use  . Smoking status: Current Every Day Smoker    Packs/day: 1.00     Years: 40.00    Pack years: 40.00    Types: Cigarettes  . Smokeless tobacco: Never Used  Substance Use Topics  . Alcohol use: Yes    Comment: rare - 1-2x/yr  . Drug use: No    Review of Systems Constitutional: Brief LOC. Eyes: Negative for visual complaints ENT: Negative for recent illness Cardiovascular: Negative for chest pain.  Positive for palpitations. Respiratory: Negative for shortness of breath. Gastrointestinal: Negative for abdominal pain, vomiting  Genitourinary: Negative for urinary compaints Musculoskeletal: Right-sided head pain, right buttock pain. Skin: Negative for skin complaints  Neurological: Mild headache All other ROS negative  ____________________________________________   PHYSICAL EXAM:  VITAL SIGNS: ED Triage Vitals  Enc Vitals Group     BP 10/08/17 1523 109/76     Pulse Rate 10/08/17 1523 (!) 139     Resp 10/08/17 1523 18     Temp 10/08/17 1523 98.7 F (37.1 C)     Temp src --      SpO2 10/08/17 1523 99 %     Weight 10/08/17 1538 173 lb (78.5 kg)     Height 10/08/17 1538 6\' 3"  (1.905 m)     Head Circumference --      Peak Flow --      Pain Score 10/08/17 1537 3     Pain Loc --      Pain Edu? --      Excl. in [place]? --     Constitutional: Alert and oriented. Well appearing and in no distress. Eyes: Normal exam ENT   Head: Normocephalic.  No hematomas erythematous abrasions lacerations noted.  Right-sided had tenderness to palpation.   Mouth/Throat: Mucous membranes are moist. Cardiovascular: Irregular rhythm, rate around 130 bpm. Respiratory: Normal respiratory effort without tachypnea nor retractions. Breath sounds are clear Gastrointestinal: Soft and nontender. No distention. Musculoskeletal: Nontender with normal range of motion in all extremities. No lower extremity tenderness or edema. Neurologic:  Normal speech and language. No gross focal neurologic deficits  Skin:  Skin is warm, dry and intact.  Psychiatric: Mood and  affect are normal.   ____________________________________________    EKG  EKG reviewed and interpreted by myself shows atrial fibrillation with rapid ventricular response at 139 bpm with a narrow QRS, normal axis, normal intervals, nonspecific ST changes.  ____________________________________________    RADIOLOGY  CT head negative  ____________________________________________   INITIAL IMPRESSION / ASSESSMENT AND PLAN / ED COURSE  Pertinent labs & imaging results that were available during my care of the patient were reviewed by me and considered in my medical decision making (see chart for details).  Patient presents to the emergency department after a fall with PVCs/palpitations found to be in rapid atrial fibrillation.  Differential would include arrhythmia, syncope, orthostatic syncope, head trauma leading to syncope, concussion, ICH.  We will obtain a CT scan of the head to rule out ICH, check labs, dose IV metoprolol, IV fluids and continue to  closely monitor in the emergency department.  Patient agreeable to plan of care.  Received 5 mg of IV metoprolol which brought the rate down to approximately 100 120 bpm, and additional 2.5 mg IV as well as 25 mg orally has brought the rate down anywhere between 80 and 105 bpm he remains in atrial fibrillation but will occasionally go through episodes of sinus rhythm and then goes back in atrial fibrillation.  I discussed admission to the hospital, patient adamantly wishes to go home.  States he always goes in and out of atrial fibrillation, that was not even the reason he came to the hospital he was just concerned about his head.  CT head is negative.  He states if he remains in atrial fibrillation tomorrow he will call his doctor or return to the emergency department.  Heart rate currently 80-100 bpm, variable rate.  ____________________________________________   FINAL CLINICAL IMPRESSION(S) / ED DIAGNOSES  Atrial fibrillation with  rapid ventricular response Closed head injury    Harvest Dark, MD 10/08/17 1919

## 2017-10-08 NOTE — ED Notes (Signed)
Patient c/o mild pain on right elbow and right hip.

## 2017-10-08 NOTE — ED Triage Notes (Signed)
Says fell after dizziness episode about 12 noon today.  Hit head on table.  Says he was aware after. Says he has history of a fib  And only takes med if it happens. Did not take med this time.  Is more concerned about his head

## 2017-10-08 NOTE — ED Notes (Signed)
Patient is calm, cooperative at this time. Family at bedside. NAD.

## 2017-11-19 NOTE — Telephone Encounter (Signed)
Unable to reach [place].

## 2018-03-14 ENCOUNTER — Ambulatory Visit: Payer: No Typology Code available for payment source | Admitting: Gastroenterology

## 2018-05-29 ENCOUNTER — Emergency Department: Payer: No Typology Code available for payment source

## 2018-05-29 ENCOUNTER — Inpatient Hospital Stay
Admission: EM | Admit: 2018-05-29 | Discharge: 2018-06-01 | DRG: 390 | Disposition: A | Discharge status: Home / Self Care | Payer: No Typology Code available for payment source | Attending: Surgery | Admitting: Surgery

## 2018-05-29 ENCOUNTER — Other Ambulatory Visit: Payer: Self-pay

## 2018-05-29 ENCOUNTER — Encounter: Payer: Self-pay | Admitting: Emergency Medicine

## 2018-05-29 DIAGNOSIS — E785 Hyperlipidemia, unspecified: Secondary | ICD-10-CM | POA: Diagnosis present

## 2018-05-29 DIAGNOSIS — K56609 Unspecified intestinal obstruction, unspecified as to partial versus complete obstruction: Principal | ICD-10-CM | POA: Diagnosis present

## 2018-05-29 DIAGNOSIS — J449 Chronic obstructive pulmonary disease, unspecified: Secondary | ICD-10-CM | POA: Diagnosis present

## 2018-05-29 DIAGNOSIS — I739 Peripheral vascular disease, unspecified: Secondary | ICD-10-CM | POA: Diagnosis present

## 2018-05-29 DIAGNOSIS — Z9081 Acquired absence of spleen: Secondary | ICD-10-CM

## 2018-05-29 DIAGNOSIS — I480 Paroxysmal atrial fibrillation: Secondary | ICD-10-CM | POA: Diagnosis present

## 2018-05-29 DIAGNOSIS — F17210 Nicotine dependence, cigarettes, uncomplicated: Secondary | ICD-10-CM | POA: Diagnosis present

## 2018-05-29 DIAGNOSIS — D72829 Elevated white blood cell count, unspecified: Secondary | ICD-10-CM | POA: Diagnosis present

## 2018-05-29 DIAGNOSIS — Z888 Allergy status to other drugs, medicaments and biological substances status: Secondary | ICD-10-CM

## 2018-05-29 DIAGNOSIS — K219 Gastro-esophageal reflux disease without esophagitis: Secondary | ICD-10-CM | POA: Diagnosis present

## 2018-05-29 DIAGNOSIS — Z79899 Other long term (current) drug therapy: Secondary | ICD-10-CM | POA: Diagnosis not present

## 2018-05-29 LAB — COMPREHENSIVE METABOLIC PANEL
ALT: 13 U/L (ref 0–44)
AST: 15 U/L (ref 15–41)
Albumin: 4.6 g/dL (ref 3.5–5.0)
Alkaline Phosphatase: 73 U/L (ref 38–126)
Anion gap: 12 (ref 5–15)
BILIRUBIN TOTAL: 1.2 mg/dL (ref 0.3–1.2)
BUN: 14 mg/dL (ref 8–23)
CO2: 31 mmol/L (ref 22–32)
CREATININE: 0.92 mg/dL (ref 0.61–1.24)
Calcium: 10 mg/dL (ref 8.9–10.3)
Chloride: 99 mmol/L (ref 98–111)
GFR calc Af Amer: >60 mL/min (ref >60)
Glucose, Bld: 119 mg/dL — ABNORMAL HIGH (ref 70–99)
Potassium: 4.1 mmol/L (ref 3.5–5.1)
Sodium: 142 mmol/L (ref 135–145)
TOTAL PROTEIN: 7.9 g/dL (ref 6.5–8.1)

## 2018-05-29 LAB — CBC
HEMATOCRIT: 47.9 % (ref 39.0–52.0)
HEMATOCRIT: 45.1 % (ref 39.0–52.0)
HEMOGLOBIN: 14.4 g/dL (ref 13.0–17.0)
Hemoglobin: 16.1 g/dL (ref 13.0–17.0)
MCH: 31 pg (ref 26.0–34.0)
MCH: 30.7 pg (ref 26.0–34.0)
MCHC: 33.6 g/dL (ref 30.0–36.0)
MCHC: 31.9 g/dL (ref 30.0–36.0)
MCV: 92.3 fL (ref 80.0–100.0)
MCV: 96.2 fL (ref 80.0–100.0)
Platelets: 253 10*3/uL (ref 150–400)
Platelets: 237 10*3/uL (ref 150–400)
RBC: 5.19 MIL/uL (ref 4.22–5.81)
RBC: 4.69 MIL/uL (ref 4.22–5.81)
RDW: 14.1 % (ref 11.5–15.5)
RDW: 14.4 % (ref 11.5–15.5)
WBC: 12.1 10*3/uL — ABNORMAL HIGH (ref 4.0–10.5)
WBC: 13.9 10*3/uL — ABNORMAL HIGH (ref 4.0–10.5)
nRBC: 0 % (ref 0.0–0.2)
nRBC: 0 % (ref 0.0–0.2)

## 2018-05-29 LAB — LIPASE, BLOOD: Lipase: 32 U/L (ref 11–51)

## 2018-05-29 MED ORDER — INFLUENZA VAC SPLIT QUAD 0.5 ML IM SUSY: 0.5000 mL | PREFILLED_SYRINGE | INTRAMUSCULAR | Status: DC

## 2018-05-29 MED ORDER — PANTOPRAZOLE SODIUM 40 MG IV SOLR
40.0000 mg | Freq: Every day | INTRAVENOUS | Status: DC
  Administered 2018-05-29 – 2018-05-31 (×3): 40 mg via INTRAVENOUS
  Filled 2018-05-29 (×3): qty 40

## 2018-05-29 MED ORDER — IOPAMIDOL (ISOVUE-300) INJECTION 61%
30.0000 mL | Freq: Once | INTRAVENOUS | Status: AC | PRN
  Administered 2018-05-29: 30 mL via ORAL

## 2018-05-29 MED ORDER — ONDANSETRON HCL 4 MG/2ML IJ SOLN
4.0000 mg | Freq: Once | INTRAMUSCULAR | Status: AC
  Administered 2018-05-29: 4 mg via INTRAVENOUS
  Filled 2018-05-29: qty 2

## 2018-05-29 MED ORDER — SODIUM CHLORIDE 0.9 % IV BOLUS
500.0000 mL | Freq: Once | INTRAVENOUS | Status: AC
  Administered 2018-05-29: 500 mL via INTRAVENOUS

## 2018-05-29 MED ORDER — SODIUM CHLORIDE 0.9 % IV BOLUS
1000.0000 mL | Freq: Once | INTRAVENOUS | Status: AC
  Administered 2018-05-29: 1000 mL via INTRAVENOUS

## 2018-05-29 MED ORDER — ONDANSETRON HCL 4 MG/2ML IJ SOLN: 4.0000 mg | Freq: Four times a day (QID) | INTRAMUSCULAR | Status: DC | PRN

## 2018-05-29 MED ORDER — IOPAMIDOL (ISOVUE-300) INJECTION 61%
100.0000 mL | Freq: Once | INTRAVENOUS | Status: AC | PRN
  Administered 2018-05-29: 100 mL via INTRAVENOUS

## 2018-05-29 MED ORDER — MORPHINE SULFATE (PF) 4 MG/ML IV SOLN
4.0000 mg | Freq: Once | INTRAVENOUS | Status: AC
  Administered 2018-05-29: 4 mg via INTRAVENOUS
  Filled 2018-05-29: qty 1

## 2018-05-29 MED ORDER — MORPHINE SULFATE (PF) 4 MG/ML IV SOLN: 4.0000 mg | INTRAVENOUS | Status: DC | PRN

## 2018-05-29 MED ORDER — ENOXAPARIN SODIUM 40 MG/0.4ML ~~LOC~~ SOLN
40.0000 mg | SUBCUTANEOUS | Status: DC
  Administered 2018-05-29 – 2018-05-31 (×3): 40 mg via SUBCUTANEOUS
  Filled 2018-05-29 (×3): qty 0.4

## 2018-05-29 MED ORDER — SODIUM CHLORIDE 0.9 % IV BOLUS: 1000.0000 mL | Freq: Once | INTRAVENOUS | Status: DC

## 2018-05-29 MED ORDER — METOPROLOL TARTRATE 5 MG/5ML IV SOLN: 5.0000 mg | Freq: Four times a day (QID) | INTRAVENOUS | Status: DC | PRN

## 2018-05-29 MED ORDER — DEXTROSE IN LACTATED RINGERS 5 % IV SOLN
INTRAVENOUS | Status: DC
  Administered 2018-05-29 – 2018-06-01 (×9): via INTRAVENOUS

## 2018-05-29 MED ORDER — KETOROLAC TROMETHAMINE 30 MG/ML IJ SOLN: 30.0000 mg | Freq: Four times a day (QID) | INTRAMUSCULAR | Status: DC | PRN

## 2018-05-29 MED ORDER — METOPROLOL TARTRATE 25 MG PO TABS: 12.5000 mg | ORAL_TABLET | Freq: Two times a day (BID) | ORAL | Status: DC

## 2018-05-29 MED ORDER — PROCHLORPERAZINE MALEATE 10 MG PO TABS
10.0000 mg | ORAL_TABLET | Freq: Four times a day (QID) | ORAL | Status: DC | PRN
  Filled 2018-05-29: qty 1

## 2018-05-29 MED ORDER — ONDANSETRON 4 MG PO TBDP: 4.0000 mg | ORAL_TABLET | Freq: Four times a day (QID) | ORAL | Status: DC | PRN

## 2018-05-29 MED ORDER — PROCHLORPERAZINE EDISYLATE 10 MG/2ML IJ SOLN
5.0000 mg | Freq: Four times a day (QID) | INTRAMUSCULAR | Status: DC | PRN
  Filled 2018-05-29: qty 2

## 2018-05-29 NOTE — ED Provider Notes (Signed)
[facility] Emergency Department Provider Note  Time seen: 8:03 AM  I have reviewed the triage vital signs and the nursing notes.   HISTORY  Chief Complaint Abdominal Pain    HPI Brian Mcclure is a 62 y.o. male with a past medical history approximately atrial fibrillation, COPD, gastric reflux, hyperlipidemia, presents to the emergency department for abdominal pain nausea vomiting.  According to the patient since yesterday evening he began experiencing mid abdominal pain.  Patient states a history of similar pains in the past, with no known cause.  States he has been worked up by GI medicine for the same including endoscopies colonoscopies and CT scans with no definitive findings per patient.  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  His last bowel movement was yesterday morning and it was small per patient.  States he has been nauseated all night but began vomiting this morning.  Denies any fever.  Denies any dysuria or hematuria but does state darker appearing urine.   Past Medical History:  Diagnosis Date  . Actinic keratosis   . Atrial fibrillation ([place])   . Chronic back pain   . COPD (chronic obstructive pulmonary disease) ([place])   . Degeneration of lumbar intervertebral disc   . Dysphonia   . Dysrhythmia    Atrial Fibrillation  . Dysthymia   . GERD (gastroesophageal reflux disease)   . Hydrocele   . Hyperlipidemia   . Myalgia   . Osteoarthritis    C-spine  . Peripheral vascular disease ([place])   . Reactive airway disease   . Seborrheic keratosis   . Tobacco use disorder   . Tremor     Patient Active Problem List   Diagnosis Date Noted  . Abdominal pain, generalized   . Stomach irritation   . Columnar epithelial-lined lower esophagus   . Special screening for malignant neoplasms, colon   . Polyp of sigmoid colon   . Diverticulosis of large intestine without  diverticulitis   . Hemorrhoids without complication   . Splenic rupture 02/08/2017  . Hemorrhagic shock ([place]) 02/06/2017  . Hypocalcemia 02/06/2017  . Lactic acidosis 02/06/2017    Past Surgical History:  Procedure Laterality Date  . [facility]  . [facility]    . COLONOSCOPY WITH PROPOFOL N/A 08/05/2017   Procedure: COLONOSCOPY WITH PROPOFOL;  Surgeon: Virgel Manifold, MD;  Location: [facility] ENDOSCOPY;  Service: Endoscopy;  Laterality: N/A;  . ESOPHAGOGASTRODUODENOSCOPY (EGD) WITH PROPOFOL N/A 08/05/2017   Procedure: ESOPHAGOGASTRODUODENOSCOPY (EGD) WITH PROPOFOL;  Surgeon: Virgel Manifold, MD;  Location: [facility] ENDOSCOPY;  Service: Endoscopy;  Laterality: N/A;  . NASAL SINUS SURGERY     several  . SPLENECTOMY  01/2017    Prior to Admission medications   Medication Sig Start Date End Date Taking? Authorizing Provider  Cholecalciferol (VITAMIN D-3) 1000 units CAPS Take by mouth 2 (two) times daily.    [provider]  metoprolol tartrate (LOPRESSOR) 25 MG tablet Take 25 mg by mouth 2 (two) times daily as needed (Takes when his atrial fibrillation 'acts up'). Takes prn    [provider]  ranitidine (ZANTAC) 150 MG tablet Take 150 mg by mouth 2 (two) times daily. Takes prn    [provider]    Allergies  Allergen Reactions  . Statins Other (See Comments)    Leg Cramps  . Flunisolide Other (See Comments)    didn't work  .  Nicotine Polacrilex [Nicotine] Other (See Comments)    didn't help stop smoking  . Wellbutrin [Bupropion] Other (See Comments)    didn't help stop smoking, somnolent    No family history on file.  Social History Social History   Tobacco Use  . Smoking status: Current Every Day Smoker    Packs/day: 1.00    Years: 40.00    Pack years: 40.00    Types: Cigarettes  . Smokeless tobacco: Never Used  Substance Use Topics  . Alcohol use: Yes    Comment: rare - 1-2x/yr  . Drug use: No    Review of  Systems Constitutional: Negative for fever. Cardiovascular: Negative for chest pain. Respiratory: Negative for shortness of breath. Gastrointestinal: Mid abdominal pain.  Positive for nausea vomiting.  Negative for diarrhea. Genitourinary: Negative for urinary compaints Musculoskeletal: Negative for musculoskeletal complaints Skin: Negative for skin complaints  Neurological: Negative for headache All other ROS negative  ____________________________________________   PHYSICAL EXAM:  VITAL SIGNS: ED Triage Vitals  Enc Vitals Group     BP 05/29/18 0743 124/68     Pulse Rate 05/29/18 0743 78     Resp 05/29/18 0743 18     Temp 05/29/18 0743 98.1 F (36.7 C)     Temp Source 05/29/18 0743 Oral     SpO2 05/29/18 0743 98 %     Weight 05/29/18 0739 173 lb 1 oz (78.5 kg)     Height --      Head Circumference --      Peak Flow --      Pain Score 05/29/18 0739 8     Pain Loc --      Pain Edu? --      Excl. in [place]? --    Constitutional: Alert and oriented. Well appearing and in no distress. Eyes: Normal exam ENT   Head: Normocephalic and atraumatic.   Mouth/Throat: Mucous membranes are moist. Cardiovascular: Normal rate, regular rhythm. No murmur Respiratory: Normal respiratory effort without tachypnea nor retractions. Breath sounds are clear  Gastrointestinal: Soft and nontender. No distention.  Musculoskeletal: Nontender with normal range of motion in all extremities.  Neurologic:  Normal speech and language. No gross focal neurologic deficits Skin:  Skin is warm, dry and intact.  Psychiatric: Mood and affect are normal.   ____________________________________________    EKG  EKG reviewed and interpreted by myself shows a normal sinus rhythm at 58 bpm with a narrow QRS, normal intervals, normal axis, no obvious concerning ST changes, electrical interference.  ____________________________________________    RADIOLOGY  CT scan consistent with small bowel obstruction  with transition point in the left upper quadrant.  ____________________________________________   INITIAL IMPRESSION / ASSESSMENT AND PLAN / ED COURSE  Pertinent labs & imaging results that were available during my care of the patient were reviewed by me and considered in my medical decision making (see chart for details).  Patient presents to the emergency department for abdominal pain nausea vomiting.  Differential is quite broad would include colitis, diverticulitis, gastritis, gastroenteritis given his recent surgery the differential would also include small bowel obstruction, internal hernia.  We will check labs, treat with IV fluids pain and nausea medication we will likely obtain CT imaging to further evaluate and rule out intra-abdominal pathology.  Patient states he has had this abdominal pain previously with a large work-up per patient that showed no findings.  Patient has proximal atrial fibrillation but is currently in normal sinus rhythm on the monitor.  CT shows a  small bowel obstruction with transition point in the left upper quadrant.  Patient had a abdominal hernia repair performed at the [facility]: [facility] 4 weeks ago.  We will attempt to transfer back to the [place].  We will place an NG tube.  [facility] and are on diversion.  We will admit the patient locally.  I discussed the patient with Dr. Dahlia Byes of general surgery who will be admitting to his service. ____________________________________________   FINAL CLINICAL IMPRESSION(S) / ED DIAGNOSES  Abdominal pain Nausea vomiting Small bowel obstruction   Harvest Dark, MD 05/29/18 1135

## 2018-05-29 NOTE — H&P (Signed)
Patient ID: Brian Mcclure, male   DOB: 05-13-1956, 62 y.o.   MRN: [id number]  HPI Brian Mcclure is a 62 y.o. male to the emergency room complaining of abdominal pain nausea and vomiting.  He reports that the pain is intermittent moderate intensity and colicky in nature.  Pain is diffuse.  No specific alleviating or aggravating factors.  Of note the patient had a laparoscopic ventral hernia repair with mesh at the [place].  So had a open splenectomy for a blunt trauma about a year ago.  Have a history of paroxysmal A. fib, COPD and GERD.  He is not anticoagulated.  He is able to perform more than 4 METS of activity without any shortness of breath or chest pain.  No fevers no chills no weight loss.  He does report that his urine is darker than usual. He also has a history of peripheral vascular disease and has an aortobi-iliac stent CT scan personal review showing evidence of dilated loops of small bowel is decompressed with normal bowel distally.  There is no free air there is no evidence of internal hernia or closed loop obstruction.  There is no pneumatosis.  There Is a transition point in the left upper quadrant NG tube was inserted and he feels much better. D/w Dr. Kerman Passey in detail. White count is 12 normal H&H.  Normal CMP.  HPI  Past Medical History:  Diagnosis Date  . Actinic keratosis   . Atrial fibrillation ([place])   . Chronic back pain   . COPD (chronic obstructive pulmonary disease) ([place])   . Degeneration of lumbar intervertebral disc   . Dysphonia   . Dysrhythmia    Atrial Fibrillation  . Dysthymia   . GERD (gastroesophageal reflux disease)   . Hydrocele   . Hyperlipidemia   . Myalgia   . Osteoarthritis    C-spine  . Peripheral vascular disease ([place])   . Reactive airway disease   . Seborrheic keratosis   . Tobacco use disorder   . Tremor     Past Surgical History:  Procedure Laterality Date  . [facility]  . [facility]    .  COLONOSCOPY WITH PROPOFOL N/A 08/05/2017   Procedure: COLONOSCOPY WITH PROPOFOL;  Surgeon: Virgel Manifold, MD;  Location: [facility] ENDOSCOPY;  Service: Endoscopy;  Laterality: N/A;  . ESOPHAGOGASTRODUODENOSCOPY (EGD) WITH PROPOFOL N/A 08/05/2017   Procedure: ESOPHAGOGASTRODUODENOSCOPY (EGD) WITH PROPOFOL;  Surgeon: Virgel Manifold, MD;  Location: [facility] ENDOSCOPY;  Service: Endoscopy;  Laterality: N/A;  . NASAL SINUS SURGERY     several  . SPLENECTOMY  01/2017    No family history on file.  Social History Social History   Tobacco Use  . Smoking status: Current Every Day Smoker    Packs/day: 1.00    Years: 40.00    Pack years: 40.00    Types: Cigarettes  . Smokeless tobacco: Never Used  Substance Use Topics  . Alcohol use: Yes    Comment: rare - 1-2x/yr  . Drug use: No    Allergies  Allergen Reactions  . Statins Other (See Comments)    Leg Cramps  . Flunisolide Other (See Comments)    didn't work  . Nicotine Polacrilex [Nicotine] Other (See Comments)    didn't help stop smoking  . Wellbutrin [Bupropion] Other (See Comments)    didn't help stop smoking, somnolent    Current Facility-Administered Medications  Medication Dose Route Frequency Provider Last Rate Last Dose  . sodium  chloride 0.9 % bolus 1,000 mL  1,000 mL Intravenous Once Harvest Dark, MD 999 mL/hr at 05/29/18 1132 1,000 mL at 05/29/18 1132  . sodium chloride 0.9 % bolus 1,000 mL  1,000 mL Intravenous Once Adanya Sosinski, Marjory Lies, MD       Current Outpatient Medications  Medication Sig Dispense Refill  . acetaminophen (TYLENOL) 325 MG tablet Take 650 mg by mouth every 4 (four) hours as needed for pain.    Marland Kitchen ibuprofen (ADVIL,MOTRIN) 400 MG tablet Take 400 mg by mouth every 6 (six) hours as needed for pain.    . metoprolol tartrate (LOPRESSOR) 25 MG tablet Take 12.5 mg by mouth 2 (two) times daily.     . ondansetron (ZOFRAN) 8 MG tablet Take 4-8 mg by mouth every 8 (eight) hours as needed for nausea/vomiting.     Marland Kitchen oxyCODONE (OXY IR/ROXICODONE) 5 MG immediate release tablet Take 5 mg by mouth 4 (four) times daily as needed for severe pain.    . rosuvastatin (CRESTOR) 20 MG tablet Take 10 mg by mouth at bedtime.     . senna (SENOKOT) 8.6 MG TABS tablet Take 2 tablets by mouth 2 (two) times daily as needed for constipation.       Review of Systems Full ROS  was asked and was negative except for the information on the HPI  Physical Exam Blood pressure 134/76, pulse 67, temperature 98.1 F (36.7 C), temperature source Oral, resp. rate 15, weight 78.5 kg, SpO2 99 %. CONSTITUTIONAL: NAD EYES: Pupils are equal, round, and reactive to light, Sclera are non-icteric. EARS, NOSE, MOUTH AND THROAT: The oropharynx is clear. The oral mucosa is pink and moist. Hearing is intact to voice. LYMPH NODES:  Lymph nodes in the neck are normal. RESPIRATORY:  Lungs are clear. There is normal respiratory effort, with equal breath sounds bilaterally, and without pathologic use of accessory muscles. CARDIOVASCULAR: Heart is regular without murmurs, gallops, or rubs. GI: The abdomen is  soft, nontender, and nondistended. There are no palpable masses. There is no hepatosplenomegaly. There are normal bowel sounds in all quadrants. Previous laparotomy scar and laparoscopy scars. No peritonitis GU: Rectal deferred.   MUSCULOSKELETAL: Normal muscle strength and tone. No cyanosis or edema.   SKIN: Turgor is good and there are no pathologic skin lesions or ulcers. NEUROLOGIC: Motor and sensation is grossly normal. Cranial nerves are grossly intact. PSYCH:  Oriented to person, place and time. Affect is normal.  Data Reviewed  I have personally reviewed the patient's imaging, laboratory findings and medical records.    Assessment/Plan 62 year old male with signs and symptoms consistent with bowel obstruction confirmed by CT scan.  Patient had a recent laparoscopic surgery with mesh insertion.  We will admit to the hospital and  hydrate him with crystalloids.  We will place an NG tube and perform serial abdominal exams as well as a x-rays in the morning.  No need for surgical intervention at this time.  Also discussed with the patient that if he does not improve he may need surgical intervention but given that he is got a recent mesh insertion and on recent surgery which is still in the worst timing to operate on him. Hopefully we will avoid an operation because it may lead to potential complications down the line. Extensive counseling provided. Caroleen Hamman, MD FACS General Surgeon 05/29/2018, 12:27 PM

## 2018-05-29 NOTE — ED Notes (Signed)
Wife leaving but son is at bedside. Wants [name] to have her number just in case. Clarene Critchley [phone]

## 2018-05-29 NOTE — ED Notes (Signed)
Pt not able to keep contrast down. MD ok with CT without contrast. CT made aware.

## 2018-05-29 NOTE — ED Triage Notes (Signed)
C/O abdominal pain and emesis.  States abdominal pain started yesterday evening at around 1730. Awoke this morning with vomiting.

## 2018-05-29 NOTE — ED Notes (Signed)
MD at bedside. 

## 2018-05-29 NOTE — ED Notes (Signed)
ED TO INPATIENT HANDOFF REPORT  Name/Age/Gender Brian Mcclure Pulse 62 y.o. male  Code Status   Home/SNF/Other Home  Chief Complaint Abdominal Pain  Level of Care/Admitting Diagnosis ED Disposition    ED Disposition Condition [facility] Area: [place] [[id number]]  Level of Care: Med-Surg [16]  Diagnosis: SBO (small bowel obstruction) [facility]) [[id number]]  Admitting Physician: Jules Husbands [[id number]]  Attending Physician: Jules Husbands [[id number]]  Estimated length of stay: 3 - 4 days  Certification:: I certify this patient will need inpatient services for at least 2 midnights  PT Class (Do Not Modify): Inpatient [101]  PT Acc Code (Do Not Modify): Private [1]       Medical History Past Medical History:  Diagnosis Date  . Actinic keratosis   . Atrial fibrillation ([place])   . Chronic back pain   . COPD (chronic obstructive pulmonary disease) ([place])   . Degeneration of lumbar intervertebral disc   . Dysphonia   . Dysrhythmia    Atrial Fibrillation  . Dysthymia   . GERD (gastroesophageal reflux disease)   . Hydrocele   . Hyperlipidemia   . Myalgia   . Osteoarthritis    C-spine  . Peripheral vascular disease ([place])   . Reactive airway disease   . Seborrheic keratosis   . Tobacco use disorder   . Tremor     Allergies Allergies  Allergen Reactions  . Statins Other (See Comments)    Leg Cramps  . Flunisolide Other (See Comments)    didn't work  . Nicotine Polacrilex [Nicotine] Other (See Comments)    didn't help stop smoking  . Wellbutrin [Bupropion] Other (See Comments)    didn't help stop smoking, somnolent    IV Location/Drains/Wounds Patient Lines/Drains/Airways Status   Active Line/Drains/Airways    Name:   Placement date:   Placement time:   Site:   Days:   Peripheral IV 05/29/18 Left Antecubital   05/29/18    0813    Antecubital   less than 1   NG/OG Tube Nasogastric 14 Fr. Left nare Xray;Aucultation   05/29/18    1106     Left nare   less than 1          Labs/Imaging Results for orders placed or performed during the hospital encounter of 05/29/18 (from the past 48 hour(s))  CBC     Status: Abnormal   Collection Time: 05/29/18  8:07 AM  Result Value Ref Range   WBC 12.1 (H) 4.0 - 10.5 K/uL   RBC 5.19 4.22 - 5.81 MIL/uL   Hemoglobin 16.1 13.0 - 17.0 g/dL   HCT 47.9 39.0 - 52.0 %   MCV 92.3 80.0 - 100.0 fL   MCH 31.0 26.0 - 34.0 pg   MCHC 33.6 30.0 - 36.0 g/dL   RDW 14.1 11.5 - 15.5 %   Platelets 253 150 - 400 K/uL   nRBC 0.0 0.0 - 0.2 %    Comment: Performed at [facility], [address] [place], [place] [address]  Comprehensive metabolic panel     Status: Abnormal   Collection Time: 05/29/18  8:07 AM  Result Value Ref Range   Sodium 142 135 - 145 mmol/L   Potassium 4.1 3.5 - 5.1 mmol/L   Chloride 99 98 - 111 mmol/L   CO2 31 22 - 32 mmol/L   Glucose, Bld 119 (H) 70 - 99 mg/dL   BUN 14 8 - 23 mg/dL   Creatinine, Ser  0.92 0.61 - 1.24 mg/dL   Calcium 10.0 8.9 - 10.3 mg/dL   Total Protein 7.9 6.5 - 8.1 g/dL   Albumin 4.6 3.5 - 5.0 g/dL   AST 15 15 - 41 U/L   ALT 13 0 - 44 U/L   Alkaline Phosphatase 73 38 - 126 U/L   Total Bilirubin 1.2 0.3 - 1.2 mg/dL   GFR calc non Af Amer >60 >60 mL/min   GFR calc Af Amer >60 >60 mL/min    Comment: (NOTE) The eGFR has been calculated using the CKD EPI equation. This calculation has not been validated in all clinical situations. eGFR's persistently <60 mL/min signify possible Chronic Kidney Disease.    Anion gap 12 5 - 15    Comment: Performed at [facility], [address] [place], [place] [address]  Lipase, blood     Status: None   Collection Time: 05/29/18  8:07 AM  Result Value Ref Range   Lipase 32 11 - 51 U/L    Comment: Performed at [facility], [address] [place], [place] [address]   Ct Abdomen Pelvis W Contrast  Result Date: 05/29/2018 CLINICAL DATA:  Patient with abdominal pain and vomiting.  Recent surgery to repair ventral wall hernia. EXAM: CT ABDOMEN AND PELVIS WITH CONTRAST TECHNIQUE: Multidetector CT imaging of the abdomen and pelvis was performed using the standard protocol following bolus administration of intravenous contrast. CONTRAST:  145m ISOVUE-300 IOPAMIDOL (ISOVUE-300) INJECTION 61% COMPARISON:  CT abdomen pelvis 07/07/2017 FINDINGS: Lower chest: Normal heart size. Dependent atelectasis bilateral lower lobes. No pleural effusion. Hepatobiliary: Liver is normal in size and contour. No focal hepatic lesions identified. Gallbladder is unremarkable. No intrahepatic or extrahepatic biliary ductal dilatation. Suspected focal adenomyomatosis of the gallbladder (image 32; series 5). Pancreas: Unremarkable Spleen: Status post splenectomy. Adrenals/Urinary Tract: Normal adrenal glands. Kidneys enhance symmetrically with contrast. Atrophy interpolar region right kidney. No hydronephrosis. Urinary bladder is unremarkable. Stomach/Bowel: The stomach, duodenum and proximal small bowel is fluid and gas distended. There is transition to decompressed small bowel within the left upper quadrant (image 39; series 2). The mid and distal small bowel is decompressed. Small amount of stool within the colon. Normal appendix. No free fluid or free intraperitoneal air. Vascular/Lymphatic: Normal caliber abdominal aorta. Aorta bi-iliac graft in place. No retroperitoneal lymphadenopathy. Reproductive: Heterogeneous prostate. Other: Interval placement of mesh within the anterior abdominal wall. Musculoskeletal: Lumbar spine degenerative changes. No aggressive or acute appearing osseous lesions. IMPRESSION: Findings compatible with small bowel obstruction with transition to decompressed small bowel within the left upper quadrant. Electronically Signed   By: DLovey NewcomerM.D.   On: 05/29/2018 10:02   Dg Abd Portable 1 View  Result Date: 05/29/2018 CLINICAL DATA:  Nasogastric tube placement. EXAM: PORTABLE ABDOMEN -  1 VIEW COMPARISON:  CT, 05/29/2018 at 9:30 a.m. FINDINGS: Nasogastric tube passes below the diaphragm into the proximal to mid stomach, well positioned. Dilated loops of small bowel are noted in the left upper quadrant. IMPRESSION: Well-positioned nasogastric tube. Electronically Signed   By: DLajean ManesM.D.   On: 05/29/2018 12:06    Pending Labs Unresulted Labs (From admission, onward)    Start     Ordered   05/29/18 0803  Urinalysis, Routine w reflex microscopic  Once,   STAT     05/29/18 0803   Signed and Held  HIV antibody (Routine Testing)  Once,   R     Signed and Held   Signed and Held  CBC  (  enoxaparin (LOVENOX)    CrCl >/= 30 ml/min)  Once,   R    Comments:  Baseline for enoxaparin therapy IF NOT ALREADY DRAWN.  Notify MD if PLT < 100 K.    Signed and Held   Signed and Held  Creatinine, serum  (enoxaparin (LOVENOX)    CrCl >/= 30 ml/min)  Once,   R    Comments:  Baseline for enoxaparin therapy IF NOT ALREADY DRAWN.    Signed and Held   Signed and Held  Creatinine, serum  (enoxaparin (LOVENOX)    CrCl >/= 30 ml/min)  Weekly,   R    Comments:  while on enoxaparin therapy    Signed and Held   Signed and Held  Comprehensive metabolic panel  Tomorrow morning,   R     Signed and Held   Signed and Held  Magnesium  Tomorrow morning,   R     Signed and Held   Signed and Held  Phosphorus  Tomorrow morning,   R     Signed and Held   Signed and Held  CBC  Tomorrow morning,   R     Signed and Held          Vitals/Pain Today's Vitals   05/29/18 1100 05/29/18 1109 05/29/18 1130 05/29/18 1200  BP: 134/76  120/72 129/65  Pulse: 67  65 61  Resp: 15  (!) 22 17  Temp:      TempSrc:      SpO2: 99%  98% 100%  Weight:      PainSc:  5       Isolation Precautions No active isolations  Medications Medications  sodium chloride 0.9 % bolus 1,000 mL (0 mLs Intravenous Stopped 05/29/18 0908)  ondansetron (ZOFRAN) injection 4 mg (4 mg Intravenous Given 05/29/18 0814)  morphine 4  MG/ML injection 4 mg (4 mg Intravenous Given 05/29/18 0814)  iopamidol (ISOVUE-300) 61 % injection 30 mL (30 mLs Oral Contrast Given 05/29/18 0820)  iopamidol (ISOVUE-300) 61 % injection 100 mL (100 mLs Intravenous Contrast Given 05/29/18 0925)  morphine 4 MG/ML injection 4 mg (4 mg Intravenous Given 05/29/18 1012)  sodium chloride 0.9 % bolus 1,000 mL (1,000 mLs Intravenous Bolus 05/29/18 1132)    Mobility walks

## 2018-05-29 NOTE — ED Notes (Addendum)
Pt wife at bedside. Pt taken to CT

## 2018-05-30 ENCOUNTER — Inpatient Hospital Stay: Payer: No Typology Code available for payment source

## 2018-05-30 LAB — COMPREHENSIVE METABOLIC PANEL
ALBUMIN: 3.4 g/dL — AB (ref 3.5–5.0)
ALK PHOS: 57 U/L (ref 38–126)
ALT: 10 U/L (ref 0–44)
AST: 12 U/L — ABNORMAL LOW (ref 15–41)
Anion gap: 5 (ref 5–15)
BILIRUBIN TOTAL: 0.8 mg/dL (ref 0.3–1.2)
BUN: 15 mg/dL (ref 8–23)
CO2: 34 mmol/L — AB (ref 22–32)
CREATININE: 0.81 mg/dL (ref 0.61–1.24)
Calcium: 8.5 mg/dL — ABNORMAL LOW (ref 8.9–10.3)
Chloride: 105 mmol/L (ref 98–111)
GFR calc Af Amer: >60 mL/min (ref >60)
GFR calc non Af Amer: >60 mL/min (ref >60)
GLUCOSE: 106 mg/dL — AB (ref 70–99)
Potassium: 3.7 mmol/L (ref 3.5–5.1)
Sodium: 144 mmol/L (ref 135–145)
Total Protein: 5.7 g/dL — ABNORMAL LOW (ref 6.5–8.1)

## 2018-05-30 LAB — CBC
HEMATOCRIT: 41.1 % (ref 39.0–52.0)
HEMOGLOBIN: 13 g/dL (ref 13.0–17.0)
MCH: 30.3 pg (ref 26.0–34.0)
MCHC: 31.6 g/dL (ref 30.0–36.0)
MCV: 95.8 fL (ref 80.0–100.0)
Platelets: 206 10*3/uL (ref 150–400)
RBC: 4.29 MIL/uL (ref 4.22–5.81)
RDW: 14.3 % (ref 11.5–15.5)
WBC: 11.7 10*3/uL — ABNORMAL HIGH (ref 4.0–10.5)
nRBC: 0 % (ref 0.0–0.2)

## 2018-05-30 LAB — MAGNESIUM: Magnesium: 2.4 mg/dL (ref 1.7–2.4)

## 2018-05-30 LAB — PHOSPHORUS: PHOSPHORUS: 2.6 mg/dL (ref 2.5–4.6)

## 2018-05-30 NOTE — Progress Notes (Signed)
[facility] Progress Note     Subjective: No acute events overnight. He notes that his abdominal pain has resolved since NGT placement. Minimal nausea this morning. Has been NPO. NGT with >2,000 ccs since placement. Does note small amount of flatus and diarrhea this morning.   Objective: Vital signs in last 24 hours: Temp:  [97.5 F (36.4 C)-98.7 F (37.1 C)] 97.5 F (36.4 C) (11/11 0408) Pulse Rate:  [48-67] 52 (11/11 0408) Resp:  [14-22] 22 (11/11 0408) BP: (116-138)/(62-76) 122/69 (11/11 0408) SpO2:  [95 %-100 %] 96 % (11/11 0408) Last BM Date: 05/29/18  Intake/Output from previous day: 11/10 0701 - 11/11 0700 In: 4089.8 [I.V.:3589.8; IV Piggyback:500] Out: 2290 [Emesis/NG output:2290] Intake/Output this shift: Total I/O In: 433.9 [I.V.:433.9] Out: 50 [Emesis/NG output:50]  PE: Gen:  Alert, NAD, pleasant Card:  Regular rate and rhythm, pedal pulses 2+ BL Pulm:  Normal effort, clear to auscultation bilaterally Abd: Soft, non-tender, non-distended, bowel sounds present in all 4 quadrants, no HSM, incisions C/D/I Skin: warm and dry, no rashes  Psych: A&Ox3   Lab Results:  Recent Labs    05/29/18 1405 05/30/18 0442  WBC 13.9* 11.7*  HGB 14.4 13.0  HCT 45.1 41.1  PLT 237 206   BMET Recent Labs    05/29/18 0807 05/30/18 0442  NA 142 144  K 4.1 3.7  CL 99 105  CO2 31 34*  GLUCOSE 119* 106*  BUN 14 15  CREATININE 0.92 0.81  CALCIUM 10.0 8.5*   PT/INR No results for input(s): LABPROT, INR in the last 72 hours. CMP     Component Value Date/Time   NA 144 05/30/2018 0442   K 3.7 05/30/2018 0442   CL 105 05/30/2018 0442   CO2 34 (H) 05/30/2018 0442   GLUCOSE 106 (H) 05/30/2018 0442   BUN 15 05/30/2018 0442   CREATININE 0.81 05/30/2018 0442   CALCIUM 8.5 (L) 05/30/2018 0442   PROT 5.7 (L) 05/30/2018 0442   ALBUMIN 3.4 (L) 05/30/2018 0442   AST 12 (L) 05/30/2018 0442   ALT 10 05/30/2018 0442   ALKPHOS 57 05/30/2018 0442   BILITOT 0.8  05/30/2018 0442   GFRNONAA >60 05/30/2018 0442   GFRAA >60 05/30/2018 0442   Lipase     Component Value Date/Time   LIPASE 32 05/29/2018 0807       Studies/Results: Ct Abdomen Pelvis W Contrast  Result Date: 05/29/2018 CLINICAL DATA:  Patient with abdominal pain and vomiting. Recent surgery to repair ventral wall hernia. EXAM: CT ABDOMEN AND PELVIS WITH CONTRAST TECHNIQUE: Multidetector CT imaging of the abdomen and pelvis was performed using the standard protocol following bolus administration of intravenous contrast. CONTRAST:  193mL ISOVUE-300 IOPAMIDOL (ISOVUE-300) INJECTION 61% COMPARISON:  CT abdomen pelvis 07/07/2017 FINDINGS: Lower chest: Normal heart size. Dependent atelectasis bilateral lower lobes. No pleural effusion. Hepatobiliary: Liver is normal in size and contour. No focal hepatic lesions identified. Gallbladder is unremarkable. No intrahepatic or extrahepatic biliary ductal dilatation. Suspected focal adenomyomatosis of the gallbladder (image 32; series 5). Pancreas: Unremarkable Spleen: Status post splenectomy. Adrenals/Urinary Tract: Normal adrenal glands. Kidneys enhance symmetrically with contrast. Atrophy interpolar region right kidney. No hydronephrosis. Urinary bladder is unremarkable. Stomach/Bowel: The stomach, duodenum and proximal small bowel is fluid and gas distended. There is transition to decompressed small bowel within the left upper quadrant (image 39; series 2). The mid and distal small bowel is decompressed. Small amount of stool within the colon. Normal appendix. No free fluid or free intraperitoneal air. Vascular/Lymphatic:  Normal caliber abdominal aorta. Aorta bi-iliac graft in place. No retroperitoneal lymphadenopathy. Reproductive: Heterogeneous prostate. Other: Interval placement of mesh within the anterior abdominal wall. Musculoskeletal: Lumbar spine degenerative changes. No aggressive or acute appearing osseous lesions. IMPRESSION: Findings compatible  with small bowel obstruction with transition to decompressed small bowel within the left upper quadrant. Electronically Signed   By: Lovey Newcomer M.D.   On: 05/29/2018 10:02   Dg Abd Portable 1 View  Result Date: 05/29/2018 CLINICAL DATA:  Nasogastric tube placement. EXAM: PORTABLE ABDOMEN - 1 VIEW COMPARISON:  CT, 05/29/2018 at 9:30 a.m. FINDINGS: Nasogastric tube passes below the diaphragm into the proximal to mid stomach, well positioned. Dilated loops of small bowel are noted in the left upper quadrant. IMPRESSION: Well-positioned nasogastric tube. Electronically Signed   By: Lajean Manes M.D.   On: 05/29/2018 12:06   Dg Abd Portable 2v  Result Date: 05/30/2018 CLINICAL DATA:  Follow up small bowel obstruction EXAM: PORTABLE ABDOMEN - 2 VIEW COMPARISON:  05/29/2018 FINDINGS: Nasogastric catheter is again noted within the stomach. Postsurgical changes are again seen. Scattered large and small bowel gas is noted. No obstructive changes are seen. No free air is noted. No bony abnormality is seen. IMPRESSION: Nasogastric catheter in satisfactory position. Resolution of previously seen small bowel dilatation. No free air is noted. Electronically Signed   By: Inez Catalina M.D.   On: 05/30/2018 07:48    Anti-infectives: Anti-infectives (From admission, onward)   None       Assessment/Plan  Small Bowel Obstruction (ICD 10: K56.52) Brian Mcclure is a 62 y.o. 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   - NPO, IVF  - NGT with >2,000 ccs out since placement, would like to see output slow prior to removal  - Continue to monitor on-going abdominal examination and bowel function  - KUB showing improvement.   - No  indication for emergent surgical intervention at this time. Patient understands this could always be a possibility should need arise. Will try to avoid given recent surgical history.   - Leukocytosis improved this morning, trend  - Will follow electrolytes (BMP) while NPO.   - Mobilize  - DVT Prophylaxis   Edison Simon , PA-C [facility] 05/30/2018, 9:11 AM [phone] M-F: 7am - 4pm

## 2018-05-30 NOTE — Progress Notes (Signed)
Initial Nutrition Assessment  DOCUMENTATION CODES:   Not applicable  INTERVENTION:   RD will monitor for diet advancement vs the need for nutrition support   NUTRITION DIAGNOSIS:   Inadequate oral intake related to altered GI function as evidenced by NPO status.  GOAL:   Patient will meet greater than or equal to 90% of their needs  MONITOR:   Diet advancement, Labs, Weight trends, Skin, I & O's  REASON FOR ASSESSMENT:   Malnutrition Screening Tool    ASSESSMENT:   62 y.o. 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   Met with pt in room today. Pt reports he is feeling much better today. Pt reports improved abdominal pain. Pt denies any nausea or vomiting. Pt reports passing some flatus today. NGT in place with 2280m output x 24 hrs. Patient pulling and twisting tube while RD in room as he reports it is hurting his throat; RN notified. Pt reports the last meal he ate was on Saturday; pt ate 2 hotdogs from the gas station. Per chart, pt is weight stable pta. RD will monitor for diet advancement vs the need for nutrition support. Pt reports he is willing to drink nutritional supplements when diet advanced.   Medications reviewed and include: lovenox, protonix, LRS w/ 5% dextrose '@125ml'$ /hr  Labs reviewed: K 3.7 wnl, P 2.6 wnl, Mg 2.4 wnl Wbc- 11.7(H)  NUTRITION - FOCUSED PHYSICAL EXAM:    Most Recent Value  Orbital Region  No depletion  Upper Arm Region  No depletion  Thoracic and Lumbar Region  No depletion  Buccal Region  No depletion  Temple Region  No depletion  Clavicle Bone Region  No depletion  Clavicle and Acromion Bone Region  No depletion  Scapular Bone Region  No depletion  Dorsal Hand  No depletion  Patellar Region  Mild depletion   Anterior Thigh Region  No depletion  Posterior Calf Region  No depletion  Edema (RD Assessment)  None  Hair  Reviewed  Eyes  Reviewed  Mouth  Reviewed  Skin  Reviewed  Nails  Reviewed     Diet Order:   Diet Order            Diet NPO time specified Except for: Ice Chips  Diet effective now             EDUCATION NEEDS:   Education needs have been addressed  Skin:  Skin Assessment: Reviewed RN Assessment  Last BM:  11/10  Height:   Ht Readings from Last 1 Encounters:  05/30/18 '6\' 3"'$  (1.905 m)    Weight:   Wt Readings from Last 1 Encounters:  05/29/18 78.5 kg    Ideal Body Weight:  89 kg  BMI:  Body mass index is 21.63 kg/m.  Estimated Nutritional Needs:   Kcal:  2000-2300kcal/day   Protein:  94-110g/day   Fluid:  >1.9L/day   CKoleen DistanceMS, RD, LDN Pager #- [phone]#- [phone] Hours Pager: [phone]

## 2018-05-30 NOTE — Progress Notes (Signed)
   05/30/18 1100  Clinical Encounter Type  Visited With Patient  Visit Type Initial;Spiritual support  Recommendations Follow-up, as requested.  Spiritual Encounters  Spiritual Needs Emotional   Chaplain and patient had a conversation about his [employer] service and his patience with his medical issue. Patient is missing Veteran's Day celebrations.Chaplain provided active listening and offered thanks for the patient's [place] service.

## 2018-05-30 NOTE — Care Management (Signed)
Noted that consent to transfer to [facility] and clinical were faxed to [facility] 11/10.  Hard copy and receipt of confirmation are on chart

## 2018-05-31 LAB — CBC
HCT: 39.1 % (ref 39.0–52.0)
Hemoglobin: 12.5 g/dL — ABNORMAL LOW (ref 13.0–17.0)
MCH: 30.7 pg (ref 26.0–34.0)
MCHC: 32 g/dL (ref 30.0–36.0)
MCV: 96.1 fL (ref 80.0–100.0)
Platelets: 189 10*3/uL (ref 150–400)
RBC: 4.07 MIL/uL — ABNORMAL LOW (ref 4.22–5.81)
RDW: 14.1 % (ref 11.5–15.5)
WBC: 9.1 10*3/uL (ref 4.0–10.5)
nRBC: 0 % (ref 0.0–0.2)

## 2018-05-31 LAB — BASIC METABOLIC PANEL
Anion gap: 8 (ref 5–15)
BUN: 14 mg/dL (ref 8–23)
CHLORIDE: 110 mmol/L (ref 98–111)
CO2: 30 mmol/L (ref 22–32)
CREATININE: 0.88 mg/dL (ref 0.61–1.24)
Calcium: 8.3 mg/dL — ABNORMAL LOW (ref 8.9–10.3)
GFR calc Af Amer: >60 mL/min (ref >60)
GFR calc non Af Amer: >60 mL/min (ref >60)
GLUCOSE: 106 mg/dL — AB (ref 70–99)
Potassium: 4.2 mmol/L (ref 3.5–5.1)
SODIUM: 144 mmol/L (ref 135–145)

## 2018-05-31 LAB — HIV ANTIBODY (ROUTINE TESTING W REFLEX): HIV Screen 4th Generation wRfx: NONREACTIVE

## 2018-05-31 MED ORDER — BOOST / RESOURCE BREEZE PO LIQD CUSTOM
1.0000 | Freq: Three times a day (TID) | ORAL | Status: DC
  Administered 2018-05-31 – 2018-06-01 (×4): 1 via ORAL

## 2018-05-31 NOTE — Progress Notes (Addendum)
SURGICAL PROGRESS NOTE (cpt: [phone])  Patient seen and examined as described below with surgical PA-C, Ardell Isaacs.  Assessment/Plan: (ICD-10's: K45.52) 62 y.o. 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  - NG tube removed this morning             - okay with clear liquids diet, decrease IV fluids             - monitor ongoing bowel function and abdominal exam              - medical management comorbidities  - smoking cessation encouraged             - ambulation encouraged              - DVT prophylaxis  I have personally reviewed the patient's chart, evaluated/examined the patient, proposed the recommended management, and discussed these recommendations with the patient to his expressed satisfaction as well as with patient's RN.  -- Marilynne Drivers. Rosana Hoes, MD, [place]: [facility] - Partnering for exceptional care. Office: [phone]      SURGICAL PROGRESS NOTE (cpt [phone])  Hospital Day(s): 2.   Post op day(s):  Marland Kitchen   Interval History: Patient seen and examined, no acute events or new complaints overnight. Patient reports he continues to have resolution in his abdominal pain, denies nausea or emesis. Has been passing flatus a "few times every hour." Mobilizing. NGT output slowed to 50 ccs in last 12 hours  Review of Systems:  Constitutional: denies fever, chills  HEENT: denies cough or congestion  Respiratory: denies any shortness of breath  Cardiovascular: denies chest pain or palpitations  Gastrointestinal: denies abdominal pain, N/V, or diarrhea/and bowel function as per  interval history Genitourinary: denies burning with urination or urinary frequency Musculoskeletal: denies pain, decreased motor or sensation Integumentary: denies any other rashes or skin discolorations Neurological: denies HA or vision/hearing changes   Vital signs in last 24 hours: [min-max] current  Temp:  [97.7 F (36.5 C)-98.2 F (36.8 C)] 98 F (36.7 C) (11/12 0447) Pulse Rate:  [43-54] 43 (11/12 0447) Resp:  [18-20] 18 (11/12 0447) BP: (125-136)/(69-72) 130/72 (11/12 0447) SpO2:  [97 %-98 %] 97 % (11/12 0447)     Height: 6\' 3"  (190.5 cm) Weight: 78.5 kg     Intake/Output this shift:  No intake/output data recorded.   Intake/Output last 2 shifts:  @IOLAST2SHIFTS @   Physical Exam:  Constitutional: alert, cooperative and no distress  HENT: normocephalic without obvious abnormality, NGT in place  Eyes: EOM's grossly intact and symmetric  Respiratory: breathing non-labored at rest  Gastrointestinal: soft, non-tender, and non-distended, previous well healed laparoscopic and midline laparotomy incisions Musculoskeletal: UE and LE FROM, no edema or wounds, motor and sensation grossly intact, NT   Labs:  CBC Latest Ref Rng & Units 05/31/2018 05/30/2018 05/29/2018  WBC 4.0 - 10.5 K/uL 9.1 11.7(H) 13.9(H)  Hemoglobin 13.0 - 17.0 g/dL 12.5(L) 13.0 14.4  Hematocrit 39.0 - 52.0 % 39.1 41.1 45.1  Platelets 150 - 400 K/uL 189 206 237   CMP Latest Ref Rng & Units 05/31/2018 05/30/2018 05/29/2018  Glucose 70 - 99 mg/dL 106(H) 106(H) 119(H)  BUN 8 - 23 mg/dL  14 15 14   Creatinine 0.61 - 1.24 mg/dL 0.88 0.81 0.92  Sodium 135 - 145 mmol/L 144 144 142  Potassium 3.5 - 5.1 mmol/L 4.2 3.7 4.1  Chloride 98 - 111 mmol/L 110 105 99  CO2 22 - 32 mmol/L 30 34(H) 31  Calcium 8.9 - 10.3 mg/dL 8.3(L) 8.5(L) 10.0  Total Protein 6.5 - 8.1 g/dL - 5.7(L) 7.9  Total Bilirubin 0.3 - 1.2 mg/dL - 0.8 1.2  Alkaline Phos 38 - 126 U/L - 57 73  AST 15 - 41 U/L - 12(L) 15  ALT 0 - 44 U/L - 10 13    Imaging studies: No new pertinent imaging studies  Assessment/Plan: (ICD-10's: K40.52) 62 y.o. 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  - Start on clears + Boost Breeze, IV fluids (wean as diet advances)             - Remove NG tube for nasogastric decompression, patient agreeable. Voiced understanding that there is always a chance we will need to re-place NGT if he develops distension or nausea/emesis             - monitor ongoing bowel function and abdominal exam  - Surgical intervention if doesn't improve was also discussed. Will try to avoid given recent surgical history.              - Will follow electrolytes (BMP) while NPO.              - ambulation encouraged              - DVT prophylaxis   All of the above findings and recommendations were discussed with the patient, patient's family, and the medical team, and all of patient's and family's questions were answered to their expressed satisfaction.  -- Edison Simon, PA-C [facility] 05/31/2018, 7:16 AM [phone] M-F: 7am - 4pm

## 2018-05-31 NOTE — Care Management (Signed)
Hassan Rowan from the [place] contacted RNCM to determine if patient still requires transfer to the [place].  Per PA patient to likely discharge on Thursday depending on progression.  This information provided to [place].  Hassan Rowan states that due to the pending discharge in 2 days, patient will remain at [facility].  Discharge summary to be faxed at discharge.

## 2018-06-01 MED ORDER — ENSURE ENLIVE PO LIQD: 237.0000 mL | Freq: Two times a day (BID) | ORAL | Status: DC

## 2018-06-01 NOTE — Progress Notes (Addendum)
[facility] Progress Note     Subjective: No acute events overnight. No complaints of abdominal pain, nausea, or emesis. Has been tolerating clear liquids without difficulty. Continues to endorse passing flatus. No BM. Mobilizing well.   Objective: Vital signs in last 24 hours: Temp:  [98.3 F (36.8 C)-98.8 F (37.1 C)] 98.5 F (36.9 C) (11/13 0434) Pulse Rate:  [44-45] 45 (11/13 0434) Resp:  [16-17] 16 (11/13 0434) BP: (111-128)/(64-75) 116/64 (11/13 0434) SpO2:  [96 %-100 %] 96 % (11/13 0434) Last BM Date: 05/28/18  Intake/Output from previous day: 11/12 0701 - 11/13 0700 In: 3406.4 [P.O.:660; I.V.:2746.4] Out: 1700 [Urine:1700] Intake/Output this shift: No intake/output data recorded.  PE: Gen:  Alert, NAD, pleasant Pulm:  Normal effort Abd: Soft, non-tender, non-distended Skin: warm and dry, no rashes  Psych: A&Ox3   Lab Results:  Recent Labs    05/30/18 0442 05/31/18 0355  WBC 11.7* 9.1  HGB 13.0 12.5*  HCT 41.1 39.1  PLT 206 189   BMET Recent Labs    05/30/18 0442 05/31/18 0355  NA 144 144  K 3.7 4.2  CL 105 110  CO2 34* 30  GLUCOSE 106* 106*  BUN 15 14  CREATININE 0.81 0.88  CALCIUM 8.5* 8.3*   PT/INR No results for input(s): LABPROT, INR in the last 72 hours. CMP     Component Value Date/Time   NA 144 05/31/2018 0355   K 4.2 05/31/2018 0355   CL 110 05/31/2018 0355   CO2 30 05/31/2018 0355   GLUCOSE 106 (H) 05/31/2018 0355   BUN 14 05/31/2018 0355   CREATININE 0.88 05/31/2018 0355   CALCIUM 8.3 (L) 05/31/2018 0355   PROT 5.7 (L) 05/30/2018 0442   ALBUMIN 3.4 (L) 05/30/2018 0442   AST 12 (L) 05/30/2018 0442   ALT 10 05/30/2018 0442   ALKPHOS 57 05/30/2018 0442   BILITOT 0.8 05/30/2018 0442   GFRNONAA >60 05/31/2018 0355   GFRAA >60 05/31/2018 0355   Lipase     Component Value Date/Time   LIPASE 32 05/29/2018 0807       Studies/Results: No results found.  Anti-infectives: Anti-infectives (From  admission, onward)   None       Assessment/Plan  Small Bowel Obstruction (ICD-10's: K33.52) 62 y.o. 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   - Advance to full liquids + Ensure, Decrease IVF  - monitor ongoing bowel function and abdominal exam             - Surgical intervention if doesn't improve was also discussed. Will try to avoid given recent surgical history.  - ambulation encouraged - DVT prophylaxis   - Discharge Planning: If tolerates fulls this morning/afternoon will advance to soft, hopefully home tomorrow  Edison Simon , PA-C [facility] 06/01/2018, 7:44 AM [phone] M-F: 7am - 4pm

## 2018-06-01 NOTE — Discharge Summary (Signed)
Discharge Summary  Patient ID: Brian Mcclure MRN: [id number] DOB/AGE: 62-Jul-1957 62 y.o.  Admit date: 05/29/2018 Discharge date: 06/01/2018  Discharge Diagnoses Small Bowel Obstruction   Consultants None  Procedures None  HPI: Brian Mcclure is a 62 y.o. male to the emergency room complaining of abdominal pain nausea and vomiting.  He reports that the pain is intermittent moderate intensity and colicky in nature.  Pain is diffuse.  No specific alleviating or aggravating factors.  Of note the patient had a laparoscopic ventral hernia repair with mesh at the [facility].  So had a open splenectomy for a blunt trauma about a year ago.  Have a history of paroxysmal A. fib, COPD and GERD. No fevers no chills no weight loss.  He does report that his urine is darker than usual. He also has a history of peripheral vascular disease and has an aortobi-iliac stent. CT scan personal review showing evidence of dilated loops of small bowel is decompressed with normal bowel distally. There is no free air there is no evidence of internal hernia or closed loop obstruction. There is no pneumatosis. There Is a transition point in the left upper quadrant  Hospital Course: Brian Mcclure was admitted to general surgery for conservative management of SBO. He had a NGT placed for 48 hours and noticed significant relief in his abdomina lpain and nausea/vomiting. His diet was advanced for the next 48 hours and he tolerated this without difficulty and continued to show clinical evidence of bowel function return. While in the hospital he continued to mobilize without difficulty. The remainder of patient's hospital course was essentially unremarkable, and discharge planning was initiated accordingly with patient safely able to be discharged home with appropriate discharge instructions and outpatient  follow-up after all of his and his family's questions were answered to their expressed  satisfaction.     Allergies as of 06/01/2018      Reactions   Statins Other (See Comments)   Leg Cramps   Flunisolide Other (See Comments)   didn't work   Nicotine Polacrilex [nicotine] Other (See Comments)   didn't help stop smoking   Wellbutrin [bupropion] Other (See Comments)   didn't help stop smoking, somnolent      Medication List    TAKE these medications   acetaminophen 325 MG tablet Commonly known as:  TYLENOL Take 650 mg by mouth every 4 (four) hours as needed for pain.   ibuprofen 400 MG tablet Commonly known as:  ADVIL,MOTRIN Take 400 mg by mouth every 6 (six) hours as needed for pain.   metoprolol tartrate 25 MG tablet Commonly known as:  LOPRESSOR Take 12.5 mg by mouth 2 (two) times daily.   ondansetron 8 MG tablet Commonly known as:  ZOFRAN Take 4-8 mg by mouth every 8 (eight) hours as needed for nausea/vomiting.   oxyCODONE 5 MG immediate release tablet Commonly known as:  Oxy IR/ROXICODONE Take 5 mg by mouth 4 (four) times daily as needed for severe pain.   rosuvastatin 20 MG tablet Commonly known as:  CRESTOR Take 10 mg by mouth at bedtime.   senna 8.6 MG Tabs tablet Commonly known as:  SENOKOT Take 2 tablets by mouth 2 (two) times daily as needed for constipation.        [place], [employer]. Call.   Specialty:  General Practice Why:  as needed for SBO follow up  Contact information: [address] [place] [address] [phone]  Jules Husbands, MD Follow up.   Specialty:  General Surgery Why:  Patient DOES NOT need to follow up with [name], this is to provide contact information Contact information: [address] [place] [address] [phone]           Signed: Edison Simon , PA-C [facility]  06/01/2018, 1:35 PM [phone] M-F: 7am - 4pm

## 2018-06-01 NOTE — Progress Notes (Signed)
Discharge instructions reviewed with patient including followup visits.  Understanding was verbalized and all questions were answered.  Patient discharged home ambulatory in stable condition.

## 2019-03-12 ENCOUNTER — Inpatient Hospital Stay (HOSPITAL_COMMUNITY): Payer: No Typology Code available for payment source

## 2019-03-12 ENCOUNTER — Emergency Department (HOSPITAL_COMMUNITY): Payer: No Typology Code available for payment source

## 2019-03-12 ENCOUNTER — Other Ambulatory Visit: Payer: Self-pay

## 2019-03-12 ENCOUNTER — Encounter (HOSPITAL_COMMUNITY)
Admission: EM | Disposition: A | Discharge status: Home / Self Care | Payer: Self-pay | Source: Home / Self Care | Attending: Cardiology

## 2019-03-12 ENCOUNTER — Encounter (HOSPITAL_COMMUNITY): Payer: Self-pay | Admitting: Emergency Medicine

## 2019-03-12 ENCOUNTER — Inpatient Hospital Stay (HOSPITAL_COMMUNITY)
Admission: EM | Admit: 2019-03-12 | Discharge: 2019-03-14 | DRG: 247 | Disposition: A | Discharge status: Home / Self Care | Payer: No Typology Code available for payment source | Attending: Cardiology | Admitting: Cardiology

## 2019-03-12 DIAGNOSIS — I236 Thrombosis of atrium, auricular appendage, and ventricle as current complications following acute myocardial infarction: Secondary | ICD-10-CM | POA: Clinically undetermined

## 2019-03-12 DIAGNOSIS — Z8249 Family history of ischemic heart disease and other diseases of the circulatory system: Secondary | ICD-10-CM

## 2019-03-12 DIAGNOSIS — M549 Dorsalgia, unspecified: Secondary | ICD-10-CM | POA: Diagnosis present

## 2019-03-12 DIAGNOSIS — Z20828 Contact with and (suspected) exposure to other viral communicable diseases: Secondary | ICD-10-CM | POA: Diagnosis present

## 2019-03-12 DIAGNOSIS — I2109 ST elevation (STEMI) myocardial infarction involving other coronary artery of anterior wall: Secondary | ICD-10-CM | POA: Diagnosis present

## 2019-03-12 DIAGNOSIS — I25119 Atherosclerotic heart disease of native coronary artery with unspecified angina pectoris: Secondary | ICD-10-CM | POA: Diagnosis present

## 2019-03-12 DIAGNOSIS — G8929 Other chronic pain: Secondary | ICD-10-CM | POA: Diagnosis present

## 2019-03-12 DIAGNOSIS — I739 Peripheral vascular disease, unspecified: Secondary | ICD-10-CM | POA: Diagnosis present

## 2019-03-12 DIAGNOSIS — Z79899 Other long term (current) drug therapy: Secondary | ICD-10-CM

## 2019-03-12 DIAGNOSIS — J449 Chronic obstructive pulmonary disease, unspecified: Secondary | ICD-10-CM | POA: Diagnosis present

## 2019-03-12 DIAGNOSIS — I2102 ST elevation (STEMI) myocardial infarction involving left anterior descending coronary artery: Secondary | ICD-10-CM | POA: Insufficient documentation

## 2019-03-12 DIAGNOSIS — K30 Functional dyspepsia: Secondary | ICD-10-CM | POA: Diagnosis not present

## 2019-03-12 DIAGNOSIS — M5136 Other intervertebral disc degeneration, lumbar region: Secondary | ICD-10-CM | POA: Diagnosis present

## 2019-03-12 DIAGNOSIS — Z955 Presence of coronary angioplasty implant and graft: Secondary | ICD-10-CM

## 2019-03-12 DIAGNOSIS — F17210 Nicotine dependence, cigarettes, uncomplicated: Secondary | ICD-10-CM | POA: Diagnosis present

## 2019-03-12 DIAGNOSIS — R001 Bradycardia, unspecified: Secondary | ICD-10-CM | POA: Diagnosis present

## 2019-03-12 DIAGNOSIS — Z888 Allergy status to other drugs, medicaments and biological substances status: Secondary | ICD-10-CM | POA: Diagnosis not present

## 2019-03-12 DIAGNOSIS — I480 Paroxysmal atrial fibrillation: Secondary | ICD-10-CM | POA: Diagnosis present

## 2019-03-12 DIAGNOSIS — E785 Hyperlipidemia, unspecified: Secondary | ICD-10-CM | POA: Diagnosis present

## 2019-03-12 DIAGNOSIS — I959 Hypotension, unspecified: Secondary | ICD-10-CM | POA: Diagnosis not present

## 2019-03-12 DIAGNOSIS — Z95820 Peripheral vascular angioplasty status with implants and grafts: Secondary | ICD-10-CM | POA: Diagnosis not present

## 2019-03-12 DIAGNOSIS — I25110 Atherosclerotic heart disease of native coronary artery with unstable angina pectoris: Secondary | ICD-10-CM | POA: Diagnosis present

## 2019-03-12 DIAGNOSIS — I2510 Atherosclerotic heart disease of native coronary artery without angina pectoris: Secondary | ICD-10-CM | POA: Diagnosis not present

## 2019-03-12 DIAGNOSIS — K219 Gastro-esophageal reflux disease without esophagitis: Secondary | ICD-10-CM | POA: Diagnosis present

## 2019-03-12 DIAGNOSIS — Z720 Tobacco use: Secondary | ICD-10-CM | POA: Diagnosis present

## 2019-03-12 HISTORY — PX: LEFT HEART CATH AND CORONARY ANGIOGRAPHY: CATH118249

## 2019-03-12 HISTORY — PX: CORONARY/GRAFT ACUTE MI REVASCULARIZATION: CATH118305

## 2019-03-12 HISTORY — DX: Paroxysmal atrial fibrillation: I48.0

## 2019-03-12 HISTORY — DX: ST elevation (STEMI) myocardial infarction involving other coronary artery of anterior wall: I21.09

## 2019-03-12 HISTORY — DX: Thrombosis of atrium, auricular appendage, and ventricle as current complications following acute myocardial infarction: I23.6

## 2019-03-12 LAB — PROTIME-INR
INR: 1 (ref 0.8–1.2)
Prothrombin Time: 12.6 seconds (ref 11.4–15.2)

## 2019-03-12 LAB — CBC WITH DIFFERENTIAL/PLATELET
Abs Immature Granulocytes: 0.05 10*3/uL (ref 0.00–0.07)
Basophils Absolute: 0.1 10*3/uL (ref 0.0–0.1)
Basophils Relative: 1 %
Eosinophils Absolute: 0.3 10*3/uL (ref 0.0–0.5)
Eosinophils Relative: 2 %
HCT: 46.2 % (ref 39.0–52.0)
Hemoglobin: 15.6 g/dL (ref 13.0–17.0)
Immature Granulocytes: 0 %
Lymphocytes Relative: 38 %
Lymphs Abs: 4.8 10*3/uL — ABNORMAL HIGH (ref 0.7–4.0)
MCH: 31.1 pg (ref 26.0–34.0)
MCHC: 33.8 g/dL (ref 30.0–36.0)
MCV: 92 fL (ref 80.0–100.0)
Monocytes Absolute: 1.5 10*3/uL — ABNORMAL HIGH (ref 0.1–1.0)
Monocytes Relative: 12 %
Neutro Abs: 6.1 10*3/uL (ref 1.7–7.7)
Neutrophils Relative %: 47 %
Platelets: 235 10*3/uL (ref 150–400)
RBC: 5.02 MIL/uL (ref 4.22–5.81)
RDW: 13.4 % (ref 11.5–15.5)
WBC: 12.8 10*3/uL — ABNORMAL HIGH (ref 4.0–10.5)
nRBC: 0 % (ref 0.0–0.2)

## 2019-03-12 LAB — LIPID PANEL
Cholesterol: 104 mg/dL (ref 0–200)
HDL: 31 mg/dL — ABNORMAL LOW (ref >40)
LDL Cholesterol: 59 mg/dL (ref 0–99)
Total CHOL/HDL Ratio: 3.4 RATIO
Triglycerides: 70 mg/dL (ref <150)
VLDL: 14 mg/dL (ref 0–40)

## 2019-03-12 LAB — COMPREHENSIVE METABOLIC PANEL
ALT: 17 U/L (ref 0–44)
AST: 19 U/L (ref 15–41)
Albumin: 4.1 g/dL (ref 3.5–5.0)
Alkaline Phosphatase: 59 U/L (ref 38–126)
Anion gap: 15 (ref 5–15)
BUN: 17 mg/dL (ref 8–23)
CO2: 18 mmol/L — ABNORMAL LOW (ref 22–32)
Calcium: 9.5 mg/dL (ref 8.9–10.3)
Chloride: 107 mmol/L (ref 98–111)
Creatinine, Ser: 1.16 mg/dL (ref 0.61–1.24)
GFR calc Af Amer: >60 mL/min (ref >60)
GFR calc non Af Amer: >60 mL/min (ref >60)
Glucose, Bld: 149 mg/dL — ABNORMAL HIGH (ref 70–99)
Potassium: 3.6 mmol/L (ref 3.5–5.1)
Sodium: 140 mmol/L (ref 135–145)
Total Bilirubin: 0.7 mg/dL (ref 0.3–1.2)
Total Protein: 6.7 g/dL (ref 6.5–8.1)

## 2019-03-12 LAB — TROPONIN I (HIGH SENSITIVITY)
Troponin I (High Sensitivity): 79 ng/L — ABNORMAL HIGH (ref <18)
Troponin I (High Sensitivity): 1296 ng/L (ref <18)
Troponin I (High Sensitivity): 8818 ng/L (ref <18)

## 2019-03-12 LAB — HEMOGLOBIN A1C
Hgb A1c MFr Bld: 6 % — ABNORMAL HIGH (ref 4.8–5.6)
Mean Plasma Glucose: 125.5 mg/dL

## 2019-03-12 LAB — SARS CORONAVIRUS 2 BY RT PCR (HOSPITAL ORDER, PERFORMED IN ~~LOC~~ HOSPITAL LAB): SARS Coronavirus 2: NEGATIVE

## 2019-03-12 LAB — MRSA PCR SCREENING: MRSA by PCR: NEGATIVE

## 2019-03-12 LAB — APTT: aPTT: 25 seconds (ref 24–36)

## 2019-03-12 SURGERY — CORONARY/GRAFT ACUTE MI REVASCULARIZATION
Anesthesia: LOCAL

## 2019-03-12 MED ORDER — NITROGLYCERIN 0.4 MG SL SUBL: 0.4000 mg | SUBLINGUAL_TABLET | SUBLINGUAL | Status: DC | PRN

## 2019-03-12 MED ORDER — TICAGRELOR 90 MG PO TABS
ORAL_TABLET | ORAL | Status: AC
  Filled 2019-03-12: qty 2

## 2019-03-12 MED ORDER — TIROFIBAN HCL IN NACL 5-0.9 MG/100ML-% IV SOLN: 0.1500 ug/kg/min | INTRAVENOUS | Status: AC

## 2019-03-12 MED ORDER — ALUM & MAG HYDROXIDE-SIMETH 200-200-20 MG/5ML PO SUSP
30.0000 mL | Freq: Once | ORAL | Status: AC
  Administered 2019-03-12: 30 mL via ORAL
  Filled 2019-03-12: qty 30

## 2019-03-12 MED ORDER — TIROFIBAN HCL IN NACL 5-0.9 MG/100ML-% IV SOLN
INTRAVENOUS | Status: AC | PRN
  Administered 2019-03-12: 0.15 ug/kg/min via INTRAVENOUS

## 2019-03-12 MED ORDER — HEPARIN (PORCINE) IN NACL 1000-0.9 UT/500ML-% IV SOLN
INTRAVENOUS | Status: DC | PRN
  Administered 2019-03-12 (×2): 500 mL

## 2019-03-12 MED ORDER — ASPIRIN 81 MG PO CHEW
81.0000 mg | CHEWABLE_TABLET | Freq: Every day | ORAL | Status: DC
  Administered 2019-03-13 – 2019-03-14 (×2): 81 mg via ORAL
  Filled 2019-03-12 (×2): qty 1

## 2019-03-12 MED ORDER — TICAGRELOR 90 MG PO TABS
ORAL_TABLET | ORAL | Status: DC | PRN
  Administered 2019-03-12: 180 mg via ORAL

## 2019-03-12 MED ORDER — HEPARIN (PORCINE) IN NACL 1000-0.9 UT/500ML-% IV SOLN
INTRAVENOUS | Status: AC
  Filled 2019-03-12: qty 1000

## 2019-03-12 MED ORDER — SODIUM CHLORIDE 0.9% FLUSH
3.0000 mL | Freq: Two times a day (BID) | INTRAVENOUS | Status: DC
  Administered 2019-03-12 – 2019-03-13 (×2): 3 mL via INTRAVENOUS

## 2019-03-12 MED ORDER — SODIUM CHLORIDE 0.9 % IV SOLN: 250.0000 mL | INTRAVENOUS | Status: DC | PRN

## 2019-03-12 MED ORDER — TICAGRELOR 90 MG PO TABS
90.0000 mg | ORAL_TABLET | Freq: Two times a day (BID) | ORAL | Status: AC
  Administered 2019-03-12 – 2019-03-13 (×3): 90 mg via ORAL
  Filled 2019-03-12 (×3): qty 1

## 2019-03-12 MED ORDER — ASPIRIN 81 MG PO CHEW: 324.0000 mg | CHEWABLE_TABLET | Freq: Once | ORAL | Status: AC

## 2019-03-12 MED ORDER — SODIUM CHLORIDE 0.9 % WEIGHT BASED INFUSION: 1.0000 mL/kg/h | INTRAVENOUS | Status: AC

## 2019-03-12 MED ORDER — VERAPAMIL HCL 2.5 MG/ML IV SOLN
INTRAVENOUS | Status: AC
  Filled 2019-03-12: qty 2

## 2019-03-12 MED ORDER — IOHEXOL 350 MG/ML SOLN
INTRAVENOUS | Status: DC | PRN
  Administered 2019-03-12: 10:00:00 125 mL via INTRA_ARTERIAL

## 2019-03-12 MED ORDER — SODIUM CHLORIDE 0.9% FLUSH: 3.0000 mL | INTRAVENOUS | Status: DC | PRN

## 2019-03-12 MED ORDER — ACETAMINOPHEN 325 MG PO TABS: 650.0000 mg | ORAL_TABLET | ORAL | Status: DC | PRN

## 2019-03-12 MED ORDER — HEPARIN SODIUM (PORCINE) 1000 UNIT/ML IJ SOLN
INTRAMUSCULAR | Status: DC | PRN
  Administered 2019-03-12: 5000 [IU] via INTRAVENOUS

## 2019-03-12 MED ORDER — VERAPAMIL HCL 2.5 MG/ML IV SOLN
INTRAVENOUS | Status: DC | PRN
  Administered 2019-03-12: 10 mL via INTRA_ARTERIAL

## 2019-03-12 MED ORDER — ONDANSETRON HCL 4 MG/2ML IJ SOLN: 4.0000 mg | Freq: Four times a day (QID) | INTRAMUSCULAR | Status: DC | PRN

## 2019-03-12 MED ORDER — SODIUM CHLORIDE 0.9 % IV SOLN: INTRAVENOUS | Status: DC

## 2019-03-12 MED ORDER — CARVEDILOL 6.25 MG PO TABS: 6.2500 mg | ORAL_TABLET | Freq: Two times a day (BID) | ORAL | Status: DC

## 2019-03-12 MED ORDER — HEPARIN (PORCINE) IN NACL 1000-0.9 UT/500ML-% IV SOLN
INTRAVENOUS | Status: DC | PRN
  Administered 2019-03-12: 500 mL

## 2019-03-12 MED ORDER — NITROGLYCERIN 1 MG/10 ML FOR IR/CATH LAB
INTRA_ARTERIAL | Status: AC
  Filled 2019-03-12: qty 10

## 2019-03-12 MED ORDER — TIROFIBAN (AGGRASTAT) BOLUS VIA INFUSION
INTRAVENOUS | Status: DC | PRN
  Administered 2019-03-12: 2040 ug via INTRAVENOUS

## 2019-03-12 MED ORDER — ENOXAPARIN SODIUM 40 MG/0.4ML ~~LOC~~ SOLN
40.0000 mg | SUBCUTANEOUS | Status: DC
  Administered 2019-03-13: 40 mg via SUBCUTANEOUS
  Filled 2019-03-12: qty 0.4

## 2019-03-12 MED ORDER — ENOXAPARIN SODIUM 40 MG/0.4ML ~~LOC~~ SOLN: 40.0000 mg | SUBCUTANEOUS | Status: DC

## 2019-03-12 MED ORDER — LIDOCAINE HCL (PF) 1 % IJ SOLN
INTRAMUSCULAR | Status: DC | PRN
  Administered 2019-03-12: 2 mL

## 2019-03-12 MED ORDER — POTASSIUM CHLORIDE CRYS ER 20 MEQ PO TBCR
40.0000 meq | EXTENDED_RELEASE_TABLET | Freq: Once | ORAL | Status: AC
  Administered 2019-03-12: 40 meq via ORAL
  Filled 2019-03-12: qty 2

## 2019-03-12 MED ORDER — TIROFIBAN HCL IN NACL 5-0.9 MG/100ML-% IV SOLN
INTRAVENOUS | Status: AC
  Filled 2019-03-12: qty 100

## 2019-03-12 MED ORDER — SODIUM CHLORIDE 0.9 % IV SOLN
INTRAVENOUS | Status: AC | PRN
  Administered 2019-03-12: 50 mL/h via INTRAVENOUS

## 2019-03-12 MED ORDER — ROSUVASTATIN CALCIUM 20 MG PO TABS
20.0000 mg | ORAL_TABLET | Freq: Every day | ORAL | Status: DC
  Administered 2019-03-12: 20 mg via ORAL
  Filled 2019-03-12: qty 1

## 2019-03-12 MED ORDER — HEPARIN SODIUM (PORCINE) 1000 UNIT/ML IJ SOLN
INTRAMUSCULAR | Status: AC
  Filled 2019-03-12: qty 1

## 2019-03-12 MED ORDER — CHLORHEXIDINE GLUCONATE CLOTH 2 % EX PADS
6.0000 | MEDICATED_PAD | Freq: Every day | CUTANEOUS | Status: DC
  Administered 2019-03-12 – 2019-03-13 (×2): 6 via TOPICAL

## 2019-03-12 MED ORDER — HEPARIN SODIUM (PORCINE) 5000 UNIT/ML IJ SOLN
60.0000 [IU]/kg | Freq: Once | INTRAMUSCULAR | Status: AC
  Administered 2019-03-12: 09:00:00 via INTRAVENOUS

## 2019-03-12 MED ORDER — NITROGLYCERIN 1 MG/10 ML FOR IR/CATH LAB
INTRA_ARTERIAL | Status: DC | PRN
  Administered 2019-03-12: 200 ug

## 2019-03-12 MED ORDER — LIDOCAINE HCL (PF) 1 % IJ SOLN
INTRAMUSCULAR | Status: AC
  Filled 2019-03-12: qty 30

## 2019-03-12 SURGICAL SUPPLY — 17 items
BALLN SAPPHIRE 2.0X12 (BALLOONS) ×2
BALLN SAPPHIRE ~~LOC~~ 4.0X15 (BALLOONS) ×1 IMPLANT
BALLOON SAPPHIRE 2.0X12 (BALLOONS) IMPLANT
CATH 5FR JL3.5 JR4 ANG PIG MP (CATHETERS) ×1 IMPLANT
CATH VISTA GUIDE 6FR XBLAD3.5 (CATHETERS) ×1 IMPLANT
DEVICE RAD COMP TR BAND LRG (VASCULAR PRODUCTS) ×1 IMPLANT
GLIDESHEATH SLEND SS 6F .021 (SHEATH) ×1 IMPLANT
GUIDEWIRE INQWIRE 1.5J.035X260 (WIRE) IMPLANT
INQWIRE 1.5J .035X260CM (WIRE) ×2
KIT ENCORE 26 ADVANTAGE (KITS) ×1 IMPLANT
KIT HEART LEFT (KITS) ×2 IMPLANT
PACK CARDIAC CATHETERIZATION (CUSTOM PROCEDURE TRAY) ×2 IMPLANT
STENT RESOLUTE ONYX 4.0X18 (Permanent Stent) ×1 IMPLANT
SYR MEDRAD MARK 7 150ML (SYRINGE) ×2 IMPLANT
TRANSDUCER W/STOPCOCK (MISCELLANEOUS) ×2 IMPLANT
TUBING CIL FLEX 10 FLL-RA (TUBING) ×2 IMPLANT
WIRE ASAHI PROWATER 180CM (WIRE) ×1 IMPLANT

## 2019-03-12 NOTE — Consult Note (Signed)
Responded to page on pt's arrival, no family present, staff will page again if chaplain services desired.  Rev. Eloise Levels Chaplain

## 2019-03-12 NOTE — ED Triage Notes (Signed)
Pt arrives to ED with [facility] as a Code STEMI with complaints of centralized chest pain starting this morning. Patient received 324 aspirin and x2 nitro with EMS. Patient is diaphoretic, pale, lethargic, and has CP 10/10 on arrival.

## 2019-03-12 NOTE — H&P (Signed)
Physician History and Physical    Patient ID: Brian Mcclure MRN: [id number] DOB/AGE: [birth year] 63 y.o. Admit date: 03/12/2019  Primary Care Physician: [facility] New  HPI: 63 yo WM with history of paroxysmal AFib, COPD, GERD, PAD s/p aorto-iliac stent graft 2 years ago presents with acute anterior STEMI. No prior coronary history. At 7:15 am today developed acute onset severe precordial chest pain radiating to his right arm and associated with diaphoresis. EMS called. Ecg showed marked ST elevation in the anterior leads c/w STEMI. He had junctional rhythm. Given ASA and sl Ntg without relief. Given IV heparin in ED.   Patient is primarily cared for by the [place] in [place]. Active smoker. History of HLD. Father had angina and died in his 10s.   Reports taking Diltiazem for AFib. Was no metoprolol but says this didn't help.  Review of systems complete and found to be negative unless listed above  Past Medical History:  Diagnosis Date  . Actinic keratosis   . Acute ST elevation myocardial infarction (STEMI) of anterior wall ([place]) 03/12/2019  . Atrial fibrillation ([place])   . Chronic back pain   . COPD (chronic obstructive pulmonary disease) ([place])   . Degeneration of lumbar intervertebral disc   . Dysphonia   . Dysrhythmia    Atrial Fibrillation  . Dysthymia   . GERD (gastroesophageal reflux disease)   . Hydrocele   . Hyperlipidemia   . Myalgia   . Osteoarthritis    C-spine  . Peripheral vascular disease ([place])   . Reactive airway disease   . Seborrheic keratosis   . Tobacco use disorder   . Tremor     History reviewed. No pertinent family history.  Social History   Socioeconomic History  . Marital status: Married    Spouse name: Not on file  . Number of children: Not on file  . Years of education: Not on file  . Highest education level: Not on file  Occupational History  . Not on file  Social Needs  . Financial resource strain: Not  hard at all  . Food insecurity    Worry: Never true    Inability: Never true  . Transportation needs    Medical: No    Non-medical: No  Tobacco Use  . Smoking status: Current Every Day Smoker    Packs/day: 1.00    Years: 40.00    Pack years: 40.00    Types: Cigarettes  . Smokeless tobacco: Never Used  Substance and Sexual Activity  . Alcohol use: Yes    Comment: rare - 1-2x/yr  . Drug use: No  . Sexual activity: Yes  Lifestyle  . Physical activity    Days per week: 7 days    Minutes per session: 120 min  . Stress: Not at all  Relationships  . Social connections    Talks on phone: More than three times a week    Gets together: More than three times a week    Attends religious service: Patient refused    Active member of club or organization: Patient refused    Attends meetings of clubs or organizations: Patient refused    Relationship status: Married  . Intimate partner violence    Fear of current or ex partner: No    Emotionally abused: No    Physically abused: No    Forced sexual activity: No  Other Topics Concern  . Not on file  Social History Narrative  . Not on  file    Past Surgical History:  Procedure Laterality Date  . [facility]  . COLONOSCOPY    . COLONOSCOPY WITH PROPOFOL N/A 08/05/2017   Procedure: COLONOSCOPY WITH PROPOFOL;  Surgeon: Virgel Manifold, MD;  Location: [facility] ENDOSCOPY;  Service: Endoscopy;  Laterality: N/A;  . ESOPHAGOGASTRODUODENOSCOPY (EGD) WITH PROPOFOL N/A 08/05/2017   Procedure: ESOPHAGOGASTRODUODENOSCOPY (EGD) WITH PROPOFOL;  Surgeon: Virgel Manifold, MD;  Location: [facility] ENDOSCOPY;  Service: Endoscopy;  Laterality: N/A;  . NASAL SINUS SURGERY     several  . SPLENECTOMY  01/2017    Family history: Father died in 34s and had chronic angina. Mother died at age 86 and had Afib.   Medications Prior to Admission  Medication Sig Dispense Refill Last Dose  . acetaminophen (TYLENOL) 325 MG tablet Take 650  mg by mouth every 4 (four) hours as needed for pain.     Marland Kitchen ibuprofen (ADVIL,MOTRIN) 400 MG tablet Take 400 mg by mouth every 6 (six) hours as needed for pain.     . metoprolol tartrate (LOPRESSOR) 25 MG tablet Take 12.5 mg by mouth 2 (two) times daily.      . ondansetron (ZOFRAN) 8 MG tablet Take 4-8 mg by mouth every 8 (eight) hours as needed for nausea/vomiting.     Marland Kitchen oxyCODONE (OXY IR/ROXICODONE) 5 MG immediate release tablet Take 5 mg by mouth 4 (four) times daily as needed for severe pain.     . rosuvastatin (CRESTOR) 20 MG tablet Take 10 mg by mouth at bedtime.      . senna (SENOKOT) 8.6 MG TABS tablet Take 2 tablets by mouth 2 (two) times daily as needed for constipation.       Physical Exam: Blood pressure 110/66, pulse 62, temperature (!) 97.5 F (36.4 C), temperature source Axillary, resp. rate 11, height 6\' 3"  (1.905 m), weight 81.6 kg, SpO2 95 %.  Current Weight  03/12/19 81.6 kg  05/29/18 78.5 kg  10/08/17 78.5 kg    GENERAL:  Well appearing WM in a lot of pain. HEENT:  PERRL, EOMI, sclera are clear. Oropharynx is clear. NECK:  No jugular venous distention, carotid upstroke brisk and symmetric, no bruits, no thyromegaly or adenopathy LUNGS:  Clear to auscultation bilaterally CHEST:  Unremarkable HEART:  RRR,  PMI not displaced or sustained,S1 and S2 within normal limits, no S3, no S4: no clicks, no rubs, no murmurs ABD:  Soft, nontender. BS +, no masses or bruits. No hepatomegaly, no splenomegaly EXT:  2 + pulses throughout, no edema, no cyanosis no clubbing SKIN:  Warm and dry.  No rashes NEURO:  Alert and oriented x 3. Cranial nerves II through XII intact. PSYCH:  Cognitively intact    Labs:   Lab Results  Component Value Date   WBC 12.8 (H) 03/12/2019   HGB 15.6 03/12/2019   HCT 46.2 03/12/2019   MCV 92.0 03/12/2019   PLT 235 03/12/2019    Recent Labs  Lab 03/12/19 0906  NA 140  K 3.6  CL 107  CO2 18*  BUN 17  CREATININE 1.16  CALCIUM 9.5  PROT 6.7   BILITOT 0.7  ALKPHOS 59  ALT 17  AST 19  GLUCOSE 149*   Lab Results  Component Value Date   TROPONINI <0.03 10/08/2017    Lab Results  Component Value Date   CHOL 104 03/12/2019   Lab Results  Component Value Date   HDL 31 (L) 03/12/2019   Lab Results  Component  Value Date   LDLCALC 59 03/12/2019   Lab Results  Component Value Date   TRIG 70 03/12/2019   Lab Results  Component Value Date   CHOLHDL 3.4 03/12/2019   No results found for: LDLDIRECT  No results found for: PROBNP No results found for: TSH No results found for: HGBA1C  Radiology: No results found.  EKG: NSR with 4-5 mm ST elevation in the anterior leads. I have personally reviewed and interpreted this study.   ASSESSMENT AND PLAN:  1. Acute anterior STEMI. Plan emergent cardiac cath and PCI. 2. P afib. On rate control therapy. Apparently has not been on anticoagulation.  3. PAD s/p aorto-iliac stent graft 4. Tobacco abuse 5. COPD 6. Hyperlipidemia on Crestor.  7. COPD   Signed:  [place], [place]  03/12/2019, 10:21 AM

## 2019-03-12 NOTE — Progress Notes (Signed)
CRITICAL VALUE ALERT  Critical Value:  Troponin   Date & Time Notied:  03/12/19 1200  Provider Notified: [place] MD  Orders Received/Actions taken: No new orders given

## 2019-03-12 NOTE — ED Notes (Signed)
Peter Martinique, MD cardiology at bedside

## 2019-03-12 NOTE — Care Management (Signed)
[facility] to [facility].  Called to [phone] and given notification ID # [id number].

## 2019-03-12 NOTE — ED Provider Notes (Signed)
[facility]ERGENCY DEPARTMENT Provider Note   CSN: [id number] Arrival date & time: 03/12/19  [id number]     History   Chief Complaint Chief Complaint  Patient presents with  . Code STEMI    HPI Brian Mcclure is a 63 y.o. male.     Patient with hx copd, vascular disease, c/o acute onset mid chest pain this AM at 7 AM. Pain acute onset, dull, mod-severe, persistent, non radiating, not pleuritic. EMS gave asa and ntg x 2. Pain persistent, constant. Mild sob. +diaphoretic. Nausea, no vomiting. No leg pain or swelling. No cough or fever.   The history is provided by the patient and the EMS personnel.    Past Medical History:  Diagnosis Date  . Actinic keratosis   . Acute ST elevation myocardial infarction (STEMI) of anterior wall ([place]) 03/12/2019  . Atrial fibrillation ([place])   . Chronic back pain   . COPD (chronic obstructive pulmonary disease) ([place])   . Degeneration of lumbar intervertebral disc   . Dysphonia   . Dysrhythmia    Atrial Fibrillation  . Dysthymia   . GERD (gastroesophageal reflux disease)   . Hydrocele   . Hyperlipidemia   . Myalgia   . Osteoarthritis    C-spine  . Peripheral vascular disease ([place])   . Reactive airway disease   . Seborrheic keratosis   . Tobacco use disorder   . Tremor     Patient Active Problem List   Diagnosis Date Noted  . Acute ST elevation myocardial infarction (STEMI) of anterior wall ([place]) 03/12/2019  . SBO (small bowel obstruction) ([place]) 05/29/2018  . Abdominal pain, generalized   . Stomach irritation   . Columnar epithelial-lined lower esophagus   . Special screening for malignant neoplasms, colon   . Polyp of sigmoid colon   . Diverticulosis of large intestine without diverticulitis   . Hemorrhoids without complication   . Splenic rupture 02/08/2017  . Hemorrhagic shock ([place]) 02/06/2017  . Hypocalcemia 02/06/2017  . Lactic acidosis 02/06/2017    Past Surgical History:  Procedure Laterality Date   . [facility]    [facility]  . COLONOSCOPY    . COLONOSCOPY WITH PROPOFOL N/A 08/05/2017   Procedure: COLONOSCOPY WITH PROPOFOL;  Surgeon: Virgel Manifold, MD;  Location: [facility] ENDOSCOPY;  Service: Endoscopy;  Laterality: N/A;  . ESOPHAGOGASTRODUODENOSCOPY (EGD) WITH PROPOFOL N/A 08/05/2017   Procedure: ESOPHAGOGASTRODUODENOSCOPY (EGD) WITH PROPOFOL;  Surgeon: Virgel Manifold, MD;  Location: [facility] ENDOSCOPY;  Service: Endoscopy;  Laterality: N/A;  . NASAL SINUS SURGERY     several  . SPLENECTOMY  01/2017        Home Medications    Prior to Admission medications   Medication Sig Start Date End Date Taking? Authorizing Provider  acetaminophen (TYLENOL) 325 MG tablet Take 650 mg by mouth every 4 (four) hours as needed for pain. 04/27/18   [provider]  ibuprofen (ADVIL,MOTRIN) 400 MG tablet Take 400 mg by mouth every 6 (six) hours as needed for pain. 04/28/18   [provider]  metoprolol tartrate (LOPRESSOR) 25 MG tablet Take 12.5 mg by mouth 2 (two) times daily.     [provider]  ondansetron (ZOFRAN) 8 MG tablet Take 4-8 mg by mouth every 8 (eight) hours as needed for nausea/vomiting. 04/27/18   [provider]  oxyCODONE (OXY IR/ROXICODONE) 5 MG immediate release tablet Take 5 mg by mouth 4 (four) times daily as needed for severe pain. 05/01/18   [provider]  rosuvastatin (CRESTOR) 20 MG tablet Take 10 mg by mouth at bedtime.     [provider]  senna (SENOKOT) 8.6 MG TABS tablet Take 2 tablets by mouth 2 (two) times daily as needed for constipation. 04/27/18   [provider]    Family History History reviewed. No pertinent family history.  Social History Social History   Tobacco Use  . Smoking status: Current Every Day Smoker    Packs/day: 1.00    Years: 40.00    Pack years: 40.00    Types: Cigarettes  . Smokeless tobacco: Never Used  Substance Use Topics  . Alcohol use: Yes    Comment:  rare - 1-2x/yr  . Drug use: No     Allergies   Statins, Flunisolide, Nicotine polacrilex [nicotine], and Wellbutrin [bupropion]   Review of Systems Review of Systems  Constitutional: Positive for diaphoresis. Negative for fever.  HENT: Negative for sore throat.   Eyes: Negative for redness.  Respiratory: Positive for shortness of breath.   Cardiovascular: Positive for chest pain. Negative for leg swelling.  Gastrointestinal: Negative for abdominal pain and vomiting.  Genitourinary: Negative for flank pain.  Musculoskeletal: Negative for back pain and neck pain.  Skin: Negative for rash.  Neurological: Negative for headaches.  Hematological: Does not bruise/bleed easily.  Psychiatric/Behavioral: Negative for confusion.     Physical Exam Updated Vital Signs BP 103/76   Pulse (!) 36   Temp (!) 97.5 F (36.4 C) (Axillary)   Resp 18   Ht 1.905 m (6\' 3" )   Wt 81.6 kg   SpO2 99%   BMI 22.50 kg/m   Physical Exam Vitals signs and nursing note reviewed.  Constitutional:      Appearance: Normal appearance. He is well-developed.  HENT:     Head: Atraumatic.     Nose: Nose normal.     Mouth/Throat:     Mouth: Mucous membranes are moist.     Pharynx: Oropharynx is clear.  Eyes:     General: No scleral icterus.    Conjunctiva/sclera: Conjunctivae normal.     Pupils: Pupils are equal, round, and reactive to light.  Neck:     Musculoskeletal: Normal range of motion and neck supple. No neck rigidity.     Trachea: No tracheal deviation.  Cardiovascular:     Rate and Rhythm: Normal rate. Rhythm irregular.     Pulses: Normal pulses.     Heart sounds: Normal heart sounds. No murmur. No friction rub. No gallop.   Pulmonary:     Effort: Pulmonary effort is normal. No accessory muscle usage or respiratory distress.     Breath sounds: Normal breath sounds.  Abdominal:     General: Bowel sounds are normal. There is no distension.     Palpations: Abdomen is soft.     Tenderness:  There is no abdominal tenderness. There is no guarding.  Genitourinary:    Comments: No cva tenderness. Musculoskeletal:        General: No swelling or tenderness.  Skin:    General: Skin is warm and dry.     Findings: No rash.  Neurological:     Mental Status: He is alert.     Comments: Alert, speech clear.   Psychiatric:        Mood and Affect: Mood normal.      ED Treatments / Results  Labs (all labs ordered are listed, but only abnormal results are displayed) Results for orders placed or performed during the hospital  encounter of 03/12/19  CBC with Differential/Platelet  Result Value Ref Range   WBC 12.8 (H) 4.0 - 10.5 K/uL   RBC 5.02 4.22 - 5.81 MIL/uL   Hemoglobin 15.6 13.0 - 17.0 g/dL   HCT 46.2 39.0 - 52.0 %   MCV 92.0 80.0 - 100.0 fL   MCH 31.1 26.0 - 34.0 pg   MCHC 33.8 30.0 - 36.0 g/dL   RDW 13.4 11.5 - 15.5 %   Platelets 235 150 - 400 K/uL   nRBC 0.0 0.0 - 0.2 %   Neutrophils Relative % 47 %   Neutro Abs 6.1 1.7 - 7.7 K/uL   Lymphocytes Relative 38 %   Lymphs Abs 4.8 (H) 0.7 - 4.0 K/uL   Monocytes Relative 12 %   Monocytes Absolute 1.5 (H) 0.1 - 1.0 K/uL   Eosinophils Relative 2 %   Eosinophils Absolute 0.3 0.0 - 0.5 K/uL   Basophils Relative 1 %   Basophils Absolute 0.1 0.0 - 0.1 K/uL   Immature Granulocytes 0 %   Abs Immature Granulocytes 0.05 0.00 - 0.07 K/uL  Protime-INR  Result Value Ref Range   Prothrombin Time 12.6 11.4 - 15.2 seconds   INR 1.0 0.8 - 1.2  APTT  Result Value Ref Range   aPTT 25 24 - 36 seconds    EKG EKG Interpretation  Date/Time:  Sunday March 12 2019 09:02:47 EDT Ventricular Rate:  81 PR Interval:    QRS Duration: 115 QT Interval:  301 QTC Calculation: 264 R Axis:   -23 Text Interpretation:  Sinus or ectopic atrial bradycardia Premature ventricular complexes Nonspecific intraventricular conduction delay Anterior infarct, acute (LAD) Lateral leads are also involved >>> Acute MI <<< ** ** ACUTE MI / STEMI ** **  Confirmed by Lajean Saver [phone]) on 03/12/2019 9:37:54 AM   Radiology No results found.  Procedures Procedures (including critical care time)  Medications Ordered in ED Medications  0.9 %  sodium chloride infusion (has no administration in time range)  lidocaine (PF) (XYLOCAINE) 1 % injection (2 mLs Infiltration Given 03/12/19 0931)  Radial Cocktail/Verapamil only (10 mLs Intra-arterial Given 03/12/19 0931)  aspirin chewable tablet 324 mg (324 mg Oral Given by EMS 03/12/19 0913)  heparin injection 60 Units/kg ( Intravenous Given 03/12/19 0902)     Initial Impression / Assessment and Plan / ED Course  I have reviewed the triage vital signs and the nursing notes.  Pertinent labs & imaging results that were available during my care of the patient were reviewed by me and considered in my medical decision making (see chart for details).  Iv ns. Continuous pulse ox and monitor. Stat ecg. Stat labs.   Code stemi activation prior to arrival, awaiting cath lab.  Pt has had asa pta. Heparin given per pharmacy.   Cardiology stat consulted w code stemi activation.   Dr Martinique in ED with pt - patient to be taken emergently to cath lab.  Recheck pt, conscious and alert, pain improved but persists. Radial pulse 2+ bil. Chest cta.   Labs reviewed by me - hgb normal. Trop pending.   CRITICAL CARE RE: STEMI, emergent cath lab activation/stat cardiology eval/consult/cath lab.  Performed by: Mirna Mires Total critical care time: 35 minutes Critical care time was exclusive of separately billable procedures and treating other patients. Critical care was necessary to treat or prevent imminent or life-threatening deterioration. Critical care was time spent personally by me on the following activities: development of treatment plan with patient and/or surrogate  as well as nursing, discussions with consultants, evaluation of patient's response to treatment, examination of patient, obtaining history  from patient or surrogate, ordering and performing treatments and interventions, ordering and review of laboratory studies, ordering and review of radiographic studies, pulse oximetry and re-evaluation of patient's condition.   Final Clinical Impressions(s) / ED Diagnoses   Final diagnoses:  None    ED Discharge Orders    None       Lajean Saver, MD 03/12/19 [phone]

## 2019-03-13 ENCOUNTER — Encounter (HOSPITAL_COMMUNITY): Payer: Self-pay | Admitting: Cardiology

## 2019-03-13 ENCOUNTER — Inpatient Hospital Stay (HOSPITAL_COMMUNITY): Payer: No Typology Code available for payment source

## 2019-03-13 DIAGNOSIS — Z955 Presence of coronary angioplasty implant and graft: Secondary | ICD-10-CM

## 2019-03-13 DIAGNOSIS — I2109 ST elevation (STEMI) myocardial infarction involving other coronary artery of anterior wall: Secondary | ICD-10-CM

## 2019-03-13 DIAGNOSIS — R001 Bradycardia, unspecified: Secondary | ICD-10-CM | POA: Diagnosis present

## 2019-03-13 DIAGNOSIS — Z720 Tobacco use: Secondary | ICD-10-CM | POA: Diagnosis present

## 2019-03-13 DIAGNOSIS — I25110 Atherosclerotic heart disease of native coronary artery with unstable angina pectoris: Secondary | ICD-10-CM | POA: Diagnosis present

## 2019-03-13 DIAGNOSIS — I236 Thrombosis of atrium, auricular appendage, and ventricle as current complications following acute myocardial infarction: Secondary | ICD-10-CM | POA: Clinically undetermined

## 2019-03-13 DIAGNOSIS — I25119 Atherosclerotic heart disease of native coronary artery with unspecified angina pectoris: Secondary | ICD-10-CM | POA: Diagnosis present

## 2019-03-13 DIAGNOSIS — I480 Paroxysmal atrial fibrillation: Secondary | ICD-10-CM | POA: Diagnosis present

## 2019-03-13 LAB — POCT I-STAT, CHEM 8
BUN: 18 mg/dL (ref 8–23)
Calcium, Ion: 1.13 mmol/L — ABNORMAL LOW (ref 1.15–1.40)
Chloride: 108 mmol/L (ref 98–111)
Creatinine, Ser: 0.8 mg/dL (ref 0.61–1.24)
Glucose, Bld: 168 mg/dL — ABNORMAL HIGH (ref 70–99)
HCT: 41 % (ref 39.0–52.0)
Hemoglobin: 13.9 g/dL (ref 13.0–17.0)
Potassium: 3.5 mmol/L (ref 3.5–5.1)
Sodium: 141 mmol/L (ref 135–145)
TCO2: 18 mmol/L — ABNORMAL LOW (ref 22–32)

## 2019-03-13 LAB — CBC
HCT: 41 % (ref 39.0–52.0)
Hemoglobin: 13.8 g/dL (ref 13.0–17.0)
MCH: 31.4 pg (ref 26.0–34.0)
MCHC: 33.7 g/dL (ref 30.0–36.0)
MCV: 93.2 fL (ref 80.0–100.0)
Platelets: 189 10*3/uL (ref 150–400)
RBC: 4.4 MIL/uL (ref 4.22–5.81)
RDW: 13.7 % (ref 11.5–15.5)
WBC: 16.4 10*3/uL — ABNORMAL HIGH (ref 4.0–10.5)
nRBC: 0 % (ref 0.0–0.2)

## 2019-03-13 LAB — ECHOCARDIOGRAM COMPLETE
Height: 75 in
Weight: 2895.96 oz

## 2019-03-13 LAB — BASIC METABOLIC PANEL
Anion gap: 8 (ref 5–15)
BUN: 9 mg/dL (ref 8–23)
CO2: 22 mmol/L (ref 22–32)
Calcium: 8.6 mg/dL — ABNORMAL LOW (ref 8.9–10.3)
Chloride: 108 mmol/L (ref 98–111)
Creatinine, Ser: 0.94 mg/dL (ref 0.61–1.24)
GFR calc Af Amer: >60 mL/min (ref >60)
GFR calc non Af Amer: >60 mL/min (ref >60)
Glucose, Bld: 106 mg/dL — ABNORMAL HIGH (ref 70–99)
Potassium: 4.2 mmol/L (ref 3.5–5.1)
Sodium: 138 mmol/L (ref 135–145)

## 2019-03-13 LAB — POCT ACTIVATED CLOTTING TIME: Activated Clotting Time: 428 seconds

## 2019-03-13 MED ORDER — PERFLUTREN LIPID MICROSPHERE
1.0000 mL | INTRAVENOUS | Status: AC | PRN
  Administered 2019-03-13: 2 mL via INTRAVENOUS
  Filled 2019-03-13: qty 10

## 2019-03-13 MED ORDER — ENOXAPARIN SODIUM 80 MG/0.8ML ~~LOC~~ SOLN
1.0000 mg/kg | Freq: Two times a day (BID) | SUBCUTANEOUS | Status: DC
  Administered 2019-03-13 – 2019-03-14 (×2): 80 mg via SUBCUTANEOUS
  Filled 2019-03-13 (×2): qty 0.8

## 2019-03-13 MED ORDER — CLOPIDOGREL BISULFATE 75 MG PO TABS
300.0000 mg | ORAL_TABLET | Freq: Once | ORAL | Status: AC
  Administered 2019-03-14: 300 mg via ORAL
  Filled 2019-03-13: qty 4

## 2019-03-13 MED ORDER — CARVEDILOL 3.125 MG PO TABS: 3.1250 mg | ORAL_TABLET | Freq: Two times a day (BID) | ORAL | Status: DC

## 2019-03-13 MED ORDER — CLOPIDOGREL BISULFATE 75 MG PO TABS: 75.0000 mg | ORAL_TABLET | Freq: Every day | ORAL | Status: DC

## 2019-03-13 MED ORDER — WARFARIN SODIUM 7.5 MG PO TABS
7.5000 mg | ORAL_TABLET | Freq: Once | ORAL | Status: AC
  Administered 2019-03-13: 7.5 mg via ORAL
  Filled 2019-03-13: qty 1

## 2019-03-13 MED ORDER — WARFARIN - PHARMACIST DOSING INPATIENT: Freq: Every day | Status: DC

## 2019-03-13 MED ORDER — ROSUVASTATIN CALCIUM 20 MG PO TABS
40.0000 mg | ORAL_TABLET | Freq: Every day | ORAL | Status: DC
  Administered 2019-03-13: 40 mg via ORAL
  Filled 2019-03-13: qty 2

## 2019-03-13 NOTE — Progress Notes (Signed)
Pt just walked with RN. Sts he felt tired but overall well.  BP and HR are stable, he sts he has always had a low HR and episodic afib. Discussed MI, stent, Brilinta, smoking cessation, diet, and CRPII. Will refer to [place]. He does not have internet at his house.  [place] CES, ACSM 2:46 PM 03/13/2019

## 2019-03-13 NOTE — Progress Notes (Addendum)
Progress Note  Patient Name: Brian Mcclure Date of Encounter: 03/13/2019  Primary Cardiologist: No primary care provider on file.  Need to [place].  Dr. Martinique  Subjective   Resting well.  Had a little bit of indigestion yesterday.  Also notes a little dyspnea issues that may be related to Brilinta. No further anginal symptoms.  He also indicated that while he was in the setting of having his MI, he basically had a general sensation of full lack of body control and although he could hear everything was unable to see or was very relatively unconscious of things going on around him.  Inpatient Medications    Scheduled Meds: . aspirin  81 mg Oral Daily  . carvedilol  3.125 mg Oral BID WC  . Chlorhexidine Gluconate Cloth  6 each Topical Daily  . enoxaparin (LOVENOX) injection  40 mg Subcutaneous Q24H  . rosuvastatin  20 mg Oral q1800  . sodium chloride flush  3 mL Intravenous Q12H  . ticagrelor  90 mg Oral BID   Continuous Infusions: . sodium chloride Stopped (03/12/19 2323)  . sodium chloride     PRN Meds: sodium chloride, acetaminophen, nitroGLYCERIN, ondansetron (ZOFRAN) IV, sodium chloride flush   Vital Signs    Vitals:   03/13/19 0200 03/13/19 0400 03/13/19 0500 03/13/19 0600  BP: 115/63 (!) 106/59  (!) 96/47  Pulse: (!) 42 (!) 46 (!) 59 (!) 43  Resp: (!) 24 20 (!) 30 18  Temp:  98.5 F (36.9 C)    TempSrc:  Oral    SpO2: 99% 99% 99% 99%  Weight:   82.1 kg   Height:        Intake/Output Summary (Last 24 hours) at 03/13/2019 0749 Last data filed at 03/13/2019 0500 Gross per 24 hour  Intake 997.06 ml  Output 1675 ml  Net -677.94 ml   Last 3 Weights 03/13/2019 03/12/2019 03/12/2019  Weight (lbs) 181 lb 184 lb 1.4 oz 180 lb  Weight (kg) 82.1 kg 83.5 kg 81.647 kg      Telemetry    Upon initial evaluation sinus bradycardia in the 40s, with sitting up heart rate in the 50s.- Personally Reviewed  ECG    Sinus bradycardia with occasional PVCs.  Rate  42 bpm.  Evolutionary changes of anterior MI noted with minimal ST elevation, biphasic ST and T waves with T wave inversion in V2-V5.- Personally Reviewed  Physical Exam   Physical Exam  Constitutional: He is oriented to person, place, and time. He appears well-developed and well-nourished.  Resting comfortably in bed.  Appears to be in no acute distress.  HENT:  Head: Normocephalic and atraumatic.  Neck: Normal range of motion. Neck supple. No hepatojugular reflux and no JVD present. Carotid bruit is not present.  Cardiovascular: Intact distal pulses.  Occasional extrasystoles are present. Bradycardia present. PMI is not displaced. Exam reveals decreased pulses (Mildly decreased pedal pulses). Exam reveals no gallop and no friction rub.  No murmur heard. Pulmonary/Chest: Effort normal and breath sounds normal. No respiratory distress. He has no wheezes. He has no rales.  Abdominal: Soft. Bowel sounds are normal. He exhibits no distension. There is no abdominal tenderness. There is no rebound.  Musculoskeletal: Normal range of motion.        General: No edema.  Neurological: He is alert and oriented to person, place, and time.  Psychiatric: He has a normal mood and affect. His behavior is normal. Judgment and thought content normal.  Nursing note  and vitals reviewed.  Labs    High Sensitivity Troponin:   Recent Labs  Lab 03/12/19 0906 03/12/19 1054 03/12/19 1642  TROPONINIHS 79* 1,296* 8,818*      Cardiac EnzymesNo results for input(s): TROPONINI in the last 168 hours. No results for input(s): TROPIPOC in the last 168 hours.   Chemistry Recent Labs  Lab 03/12/19 0906 03/13/19 0241  NA 140 138  K 3.6 4.2  CL 107 108  CO2 18* 22  GLUCOSE 149* 106*  BUN 17 9  CREATININE 1.16 0.94  CALCIUM 9.5 8.6*  PROT 6.7  --   ALBUMIN 4.1  --   AST 19  --   ALT 17  --   ALKPHOS 59  --   BILITOT 0.7  --   GFRNONAA >60 >60  GFRAA >60 >60  ANIONGAP 15 8     Hematology Recent  Labs  Lab 03/12/19 0906 03/13/19 0241  WBC 12.8* 16.4*  RBC 5.02 4.40  HGB 15.6 13.8  HCT 46.2 41.0  MCV 92.0 93.2  MCH 31.1 31.4  MCHC 33.8 33.7  RDW 13.4 13.7  PLT 235 189    BNPNo results for input(s): BNP, PROBNP in the last 168 hours.   DDimer No results for input(s): DDIMER in the last 168 hours.   Radiology    Dg Chest Port 1 View  Result Date: 03/12/2019 CLINICAL DATA:  Chest pain. EXAM: PORTABLE CHEST 1 VIEW COMPARISON:  None FINDINGS: Heart size is accentuated by the portable AP technique. There are no focal consolidations or pleural effusions. No pulmonary edema. Surgical clips are seen in the LEFT UPPER QUADRANT of the abdomen. Remote rib fractures. IMPRESSION: No active disease. Electronically Signed   By: Nolon Nations M.D.   On: 03/12/2019 11:45    Cardiac Studies    Cardiac cath and PCI 03/12/2019: pLAD 100% (thrombotic) -- DES PCI Resolute Onyx DES 4 x 18 (~4.1 mm). EF 40-45% with Ant HK.  2D echo pending  Patient Profile     63 y.o. male with PMH notable for A. fib (apparently on diltiazem), COPD, GERD and PAD (aortoiliac stent in 2018) presented with acute anterior STEMI -> onset of chest pain 7:15 AM 03/12/2019. -> Taken to Cath Lab, found to have proximal LAD occlusion--DES PCI with resolute Onyx DES 4 mm 18 mm  Assessment & Plan    Principal Problem:   Acute ST elevation myocardial infarction (STEMI) of anterior wall (HCC) Active Problems:   Coronary artery disease involving native coronary artery of native heart with unstable angina pectoris (HCC)   Presence of drug coated stent in LAD coronary artery   PAF (paroxysmal atrial fibrillation) (HCC)   Tobacco abuse     Acute ST elevation myocardial infarction (STEMI) of anterior wall (HCC) ->Coronary artery disease involving native coronary artery of native heart with unstable angina pectoris (HCC) ->  Presence of drug coated stent in LAD coronary artery  Currently on aspirin and Brilinta -> we  will need to determine the A. fib burden -> had not recently been on anticoagulation.  On moderate dose Crestor, will increase to 40 mg.  Currently written for 6.25 mg Coreg, but with bradycardia and hypotension, will hold beta-blocker for now. I suspect that he may not tolerate beta-blocker.->  Hold parameters in place. ->  He tells me needing low-dose metoprolol because his heart rate going to the 40s.  Anticipate stopping beta-blocker for now.  Seems euvolemic despite mildly reduced EF.  Will monitor.  2D  echo pending    PAF (paroxysmal atrial fibrillation) (HCC) -> not sure of his A. fib burden.  May consider outpatient event monitor  to better delineate his A. fib burden. ->  He is currently on aspirin and Brilinta, however may need to consider DOAC and conversion to Plavix if there is evidence of A. fib.  This patients CHA2DS2-VASc Score and unadjusted Ischemic Stroke Rate (% per year) is equal to 2.2 % stroke rate/year from a score of 2 Above score calculated as 1 point each if present [CHF, HTN, DM, Vascular=MI/PAD/Aortic Plaque, Age if 65-74, or Male] Above score calculated as 2 points each if present [Age > 75, or Stroke/TIA/TE]    Tobacco abuse -tobacco use counseling provided  Current lipid panel shows LDL of 59.  Continue with Crestor but increase to 40 mg peri-infarct.  Reevaluate in the outpatient setting.  If stable today with heart rate and blood pressure, can consider transfer to telemetry.   For questions or updates, please contact [place] Please consult [email or web address] for contact info under    --Addendum: Echocardiogram reviewed shows small LV apical thrombus.  In light of the new finding of LV thrombus, we will go ahead and start treatment dose Lovenox tonight along with warfarin.  We will convert from Brilinta to Plavix tomorrow with 300 mg load.  After 1 month would stop aspirin and continue Plavix plus warfarin.     Signed, Glenetta Hew, MD   03/13/2019, 7:49 AM

## 2019-03-13 NOTE — Progress Notes (Signed)
  Echocardiogram 2D Echocardiogram with definity has been performed.  Numan Zylstra L Androw 03/13/2019, 8:49 AM

## 2019-03-13 NOTE — Assessment & Plan Note (Signed)
pLAD 100% - DES PCI - Resolute Oxynx DES 4 x 18

## 2019-03-13 NOTE — Progress Notes (Signed)
ANTICOAGULATION CONSULT NOTE - Initial Consult  Pharmacy Consult for Lovenox and Warfarin  Indication: LV Thrombus and h/o PAF  Allergies  Allergen Reactions  . Statins Other (See Comments)    Leg Cramps  . Flunisolide Other (See Comments)    didn't work  . Nicotine Polacrilex [Nicotine] Other (See Comments)    didn't help stop smoking  . Wellbutrin [Bupropion] Other (See Comments)    Caused somnolence (and didn't help to stop smoking)    Patient Measurements: Height: 6\' 3"  (190.5 cm) Weight: 181 lb (82.1 kg) IBW/kg (Calculated) : 84.5 Heparin Dosing Weight: 82.1 kg  Vital Signs: Temp: 98 F (36.7 C) (08/24 1706) Temp Source: Oral (08/24 1706) BP: 105/64 (08/24 1706) Pulse Rate: 53 (08/24 1706)  Labs: Recent Labs    03/12/19 0906 03/12/19 0937 03/12/19 1054 03/12/19 1642 03/13/19 0241  HGB 15.6 13.9  --   --  13.8  HCT 46.2 41.0  --   --  41.0  PLT 235  --   --   --  189  APTT 25  --   --   --   --   LABPROT 12.6  --   --   --   --   INR 1.0  --   --   --   --   CREATININE 1.16 0.80  --   --  0.94  TROPONINIHS 79*  --  1,296* 8,818*  --     Estimated Creatinine Clearance: 93.4 mL/min (by C-G formula based on SCr of 0.94 mg/dL).   Medical History: Past Medical History:  Diagnosis Date  . Actinic keratosis   . Acute ST elevation myocardial infarction (STEMI) of anterior wall ([place]) 03/12/2019   Prox LAD 100% thrombotic --> 4 x 18 Resolute Onyx DES  . Chronic back pain   . COPD (chronic obstructive pulmonary disease) ([place])   . Coronary artery disease involving native coronary artery of native heart with unstable angina pectoris ([place]) 03/13/2019   pLAD 100% - DES PCI - Resolute Oxynx DES 4 x 18; otw minimal CAD  . Degeneration of lumbar intervertebral disc   . Dysphonia   . GERD (gastroesophageal reflux disease)   . Hydrocele   . Hyperlipidemia   . Myalgia   . Osteoarthritis    C-spine  . PAF (paroxysmal atrial fibrillation) ([place])   . Peripheral  vascular disease ([place])   . Reactive airway disease   . Seborrheic keratosis   . Tobacco use disorder   . Tremor     Medications:  Medications Prior to Admission  Medication Sig Dispense Refill Last Dose  . Ca Carbonate-Mag Hydroxide (ROLAIDS PO) Take 1 tablet by mouth at bedtime as needed (heartburn/acid reflux).   couple nights ago  . cholecalciferol (VITAMIN D) 25 MCG (1000 UT) tablet Take 1,000 Units by mouth 2 (two) times daily.   03/11/2019 at pm  . diclofenac sodium (VOLTAREN) 1 % GEL Apply 1 application topically daily as needed (pain).   few weeks ago  . diltiazem (TIAZAC) 120 MG 24 hr capsule Take 120 mg by mouth every morning.   03/11/2019 at am  . oxymetazoline (AFRIN) 0.05 % nasal spray Place 1 spray into both nostrils 2 (two) times daily as needed for congestion.   few days ago  . rosuvastatin (CRESTOR) 20 MG tablet Take 10 mg by mouth at bedtime.    03/11/2019 at pm  . Simethicone (GAS-X PO) Take 1 tablet by mouth 3 (three) times daily as needed (heartburn).  couple days ago   Scheduled:  . aspirin  81 mg Oral Daily  . Chlorhexidine Gluconate Cloth  6 each Topical Daily  . [START ON 03/14/2019] clopidogrel  300 mg Oral Once  . [START ON 03/14/2019] clopidogrel  75 mg Oral Daily  . enoxaparin (LOVENOX) injection  40 mg Subcutaneous Q24H  . rosuvastatin  40 mg Oral q1800  . sodium chloride flush  3 mL Intravenous Q12H  . ticagrelor  90 mg Oral BID    Assessment: 63 y.o male admitted 03/12/19 with acute onset severe precordial chest pain.  Echocardiogram reviewed shows small LV apical thrombus. Pharmacy consulted to start Lovenox and Warfarin for LV thrombus and h/o PAF.   Patient was not on anticoagulation PTA.  CBC wnl.  Scr 0.94 Lovenox 40mg  sq dose  given 8/24 at 0930   Goal of Therapy:  Anti-Xa level 0.6-1 units/ml 4hrs after LMWH dose given  Monitor platelets by anticoagulation protocol: Yes   Plan:  Lovenox 80 mg SQ q12h  Warfarin 7.5 mg po x1 tonight Daily  Protime /INR  Monitor CBC q72 hours  Thank you for allowing pharmacy to be part of this patients care team.  Nicole Cella, RPh Clinical Pharmacist Please check AMION for all [facility] phone numbers After 10:00 PM, call [place] [phone] 03/13/2019,7:22 PM

## 2019-03-14 ENCOUNTER — Encounter (HOSPITAL_COMMUNITY): Payer: Self-pay | Admitting: Physician Assistant

## 2019-03-14 DIAGNOSIS — Z720 Tobacco use: Secondary | ICD-10-CM

## 2019-03-14 DIAGNOSIS — I236 Thrombosis of atrium, auricular appendage, and ventricle as current complications following acute myocardial infarction: Secondary | ICD-10-CM

## 2019-03-14 DIAGNOSIS — Z955 Presence of coronary angioplasty implant and graft: Secondary | ICD-10-CM

## 2019-03-14 DIAGNOSIS — I480 Paroxysmal atrial fibrillation: Secondary | ICD-10-CM

## 2019-03-14 DIAGNOSIS — I25110 Atherosclerotic heart disease of native coronary artery with unstable angina pectoris: Secondary | ICD-10-CM

## 2019-03-14 DIAGNOSIS — R001 Bradycardia, unspecified: Secondary | ICD-10-CM

## 2019-03-14 LAB — PROTIME-INR
INR: 1.1 (ref 0.8–1.2)
Prothrombin Time: 13.9 seconds (ref 11.4–15.2)

## 2019-03-14 MED ORDER — COUMADIN BOOK
Freq: Once | Status: AC
  Administered 2019-03-14: 1
  Filled 2019-03-14: qty 1

## 2019-03-14 MED ORDER — ROSUVASTATIN CALCIUM 40 MG PO TABS: 40.0000 mg | ORAL_TABLET | Freq: Every day | ORAL | 6 refills | Status: DC

## 2019-03-14 MED ORDER — NITROGLYCERIN 0.4 MG SL SUBL: 0.4000 mg | SUBLINGUAL_TABLET | SUBLINGUAL | 12 refills | Status: AC | PRN

## 2019-03-14 MED ORDER — ASPIRIN EC 81 MG PO TBEC: 81.0000 mg | DELAYED_RELEASE_TABLET | Freq: Every day | ORAL | 0 refills | Status: AC

## 2019-03-14 MED ORDER — CLOPIDOGREL BISULFATE 75 MG PO TABS: 75.0000 mg | ORAL_TABLET | Freq: Every day | ORAL | 11 refills | Status: DC

## 2019-03-14 MED ORDER — WARFARIN SODIUM 7.5 MG PO TABS: 7.5000 mg | ORAL_TABLET | Freq: Once | ORAL | Status: DC

## 2019-03-14 MED ORDER — ENOXAPARIN SODIUM 80 MG/0.8ML ~~LOC~~ SOLN: 1.0000 mg/kg | Freq: Two times a day (BID) | SUBCUTANEOUS | 0 refills | Status: DC

## 2019-03-14 MED ORDER — WARFARIN SODIUM 5 MG PO TABS: ORAL_TABLET | ORAL | 0 refills | Status: DC

## 2019-03-14 MED FILL — ASPIRIN LOW DOSE 81 MG TBEC: 81 | 30 days supply | Qty: 30 | Fill #0

## 2019-03-14 MED FILL — NITROGLYCERIN 0.4 MG TAB SL: 0.4 | 8 days supply | Qty: 25 | Fill #0

## 2019-03-14 MED FILL — WARFARIN SODIUM 5 MG TABLET: 5 | 23 days supply | Qty: 30 | Fill #0

## 2019-03-14 MED FILL — ENOXAPARIN SODIUM 80 MG/0.8: 80 | 14 days supply | Qty: 22 | Fill #0

## 2019-03-14 MED FILL — ROSUVASTATIN CALCIUM 40 MG: 40 | 30 days supply | Qty: 30 | Fill #0

## 2019-03-14 MED FILL — CLOPIDOGREL 75 MG TABLET: 75 | 30 days supply | Qty: 30 | Fill #0

## 2019-03-14 NOTE — Progress Notes (Signed)
Pt QTC on EKG this am 561 (from 410 on previous EKG).  Dr. Jeannette Corpus notified via [place].  Will continue to monitor pt closely.

## 2019-03-14 NOTE — TOC Initial Note (Signed)
Transition of Care [facility]) - Initial/Assessment Note    Patient Details  Name: Brian Mcclure MRN: [id number] Date of Birth: 01/27/1956  Transition of Care [facility]) CM/SW Contact:    Bethena Roys, RN Phone Number: 03/14/2019, 11:39 AM  Clinical Narrative: Pt presented for [facility]- plan for home on Plavix, Coumadin and Lovenox injections. CM discussed with patient regarding medications via [place]. Medications have been filled here via the TOC-Pharmacy. CM will send information to [facility]. No further needs from CM at this time.               Expected Discharge Plan: Home/Self Care Barriers to Discharge: No Barriers Identified   Patient Goals and CMS Choice Patient states their goals for this hospitalization and ongoing recovery are:: "to return home with wife"   Choice offered to / list presented to : NA  Expected Discharge Plan and Services Expected Discharge Plan: Home/Self Care In-house Referral: NA Discharge Planning Services: CM Consult Post Acute Care Choice: NA Living arrangements for the past 2 months: Single Family Home Expected Discharge Date: 03/14/19               [facility] Arranged: NA    Prior Living Arrangements/Services Living arrangements for the past 2 months: Single Family Home Lives with:: Spouse Patient language and need for interpreter reviewed:: Yes Do you feel safe going back to the place where you live?: Yes      Need for Family Participation in Patient Care: Yes (Comment) Care giver support system in place?: Yes (comment)   Criminal Activity/Legal Involvement Pertinent to Current Situation/Hospitalization: No - Comment as needed  Activities of Daily Living Home Assistive Devices/Equipment: Walker (specify type)(seated) ADL Screening (condition at time of admission) Patient's cognitive ability adequate to safely complete daily activities?: Yes Is the patient deaf or have difficulty hearing?: Yes(left ear) Does the  patient have difficulty seeing, even when wearing glasses/contacts?: No Does the patient have difficulty concentrating, remembering, or making decisions?: No Patient able to express need for assistance with ADLs?: No Does the patient have difficulty dressing or bathing?: No Independently performs ADLs?: Yes (appropriate for developmental age) Communication: Independent Dressing (OT): Independent Grooming: Independent Feeding: Independent Bathing: Independent Toileting: Independent In/Out Bed: Independent Walks in Home: Independent Does the patient have difficulty walking or climbing stairs?: No Weakness of Legs: None Weakness of Arms/Hands: None  Permission Sought/Granted Permission sought to share information with : Family Supports, Chartered certified accountant granted to share information with : Yes, Verbal Permission Granted     Permission granted to share info w AGENCY: [place]        Emotional Assessment Appearance:: Appears stated age Attitude/Demeanor/Rapport: Engaged Affect (typically observed): Accepting Orientation: : Oriented to Situation, Oriented to  Time, Oriented to Place, Oriented to Self Alcohol / Substance Use: Not Applicable Psych Involvement: No (comment)  Admission diagnosis:  code stemi Patient Active Problem List   Diagnosis Date Noted  . Coronary artery disease involving native coronary artery of native heart with unstable angina pectoris ([place]) 03/13/2019  . Presence of drug coated stent in LAD coronary artery 03/13/2019  . Tobacco abuse 03/13/2019  . Left ventricular thrombus following MI ([place]) 03/13/2019  . Sinus bradycardia, persistent 03/13/2019  . PAF (paroxysmal atrial fibrillation) ([place])   . Acute ST elevation myocardial infarction (STEMI) of anterior wall ([place]) 03/12/2019  . SBO (small bowel obstruction) ([place]) 05/29/2018  . Columnar epithelial-lined lower esophagus   . Special screening for malignant  neoplasms, colon    . Polyp of sigmoid colon   . Diverticulosis of large intestine without diverticulitis   . Hypocalcemia 02/06/2017   PCP:  [facility] [place], [place] - [address] [place] [address] Phone: [phone] Fax: [phone]  [facility] [place], [place] - [address] [place] [address] Phone: [phone] Fax: [phone]   Social Determinants of Health (SDOH) Interventions    Readmission Risk Interventions No flowsheet data found.

## 2019-03-14 NOTE — Progress Notes (Addendum)
[place] for Lovenox and Warfarin  Indication: LV Thrombus and h/o PAF  Allergies  Allergen Reactions  . Statins Other (See Comments)    Leg Cramps  . Flunisolide Other (See Comments)    didn't work  . Nicotine Polacrilex [Nicotine] Other (See Comments)    didn't help stop smoking  . Wellbutrin [Bupropion] Other (See Comments)    Caused somnolence (and didn't help to stop smoking)    Patient Measurements: Height: 6\' 3"  (190.5 cm) Weight: 178 lb 12.8 oz (81.1 kg) IBW/kg (Calculated) : 84.5 Heparin Dosing Weight: 82.1 kg  Vital Signs: Temp: 98.2 F (36.8 C) (08/25 0533) Temp Source: Oral (08/25 0533) BP: 108/64 (08/25 0533) Pulse Rate: 51 (08/25 0533)  Labs: Recent Labs    03/12/19 0906 03/12/19 0937 03/12/19 1054 03/12/19 1642 03/13/19 0241 03/14/19 0315  HGB 15.6 13.9  --   --  13.8  --   HCT 46.2 41.0  --   --  41.0  --   PLT 235  --   --   --  189  --   APTT 25  --   --   --   --   --   LABPROT 12.6  --   --   --   --  13.9  INR 1.0  --   --   --   --  1.1  CREATININE 1.16 0.80  --   --  0.94  --   TROPONINIHS 79*  --  1,296* 8,818*  --   --     Estimated Creatinine Clearance: 92.3 mL/min (by C-G formula based on SCr of 0.94 mg/dL).   Assessment: 63 y.o male admitted 03/12/19 with acute onset severe precordial chest pain.  Echocardiogram reviewed shows small LV apical thrombus. Pharmacy consulted to start Lovenox and Warfarin for LV thrombus and h/o PAF.   Patient was not on anticoagulation PTA.   INR 1.1 today after 1 dose of warfarin   Goal of Therapy:  Anti-Xa level 0.6-1 units/ml 4hrs after LMWH dose given  Monitor platelets by anticoagulation protocol: Yes  INR 2 to 3   Plan:  Lovenox 80 mg SQ q12h  Repeat Warfarin 7.5 mg po x1 tonight Daily Protime /INR  Monitor CBC q72 hours  Warfarin and Lovenox education completed  Thank you Anette Guarneri, PharmD [phone] 03/14/2019,9:01 AM

## 2019-03-14 NOTE — Discharge Summary (Addendum)
Discharge Summary    Patient ID: Brian Mcclure MRN: [id number]; DOB: 09/07/1955  Admit date: 03/12/2019 Discharge date: 03/14/2019  Primary Care Provider: [facility] [place]  Primary Cardiologist: Dr. Martinique (He is planning to establish care at [facility] until likely follow up at [facility] office)  Discharge Diagnoses    Principal Problem:   Acute ST elevation myocardial infarction (STEMI) of anterior wall [facility]) Active Problems:   Coronary artery disease involving native coronary artery of native heart with unstable angina pectoris ([place])   Presence of drug coated stent in LAD coronary artery   PAF (paroxysmal atrial fibrillation) (H[facility])   Tobacco abuse   Left ventricular thrombus following MI ([facility])   Sinus bradycardia, persistent   Allergies Allergies  Allergen Reactions  . Statins Other (See Comments)    Leg Cramps  . Flunisolide Other (See Comments)    didn't work  . Nicotine Polacrilex [Nicotine] Other (See Comments)    didn't help stop smoking  . Wellbutrin [Bupropion] Other (See Comments)    Caused somnolence (and didn't help to stop smoking)    Diagnostic Studies/Procedures    Echocardiogram 03/13/2019  1. The left ventricle has mild-moderately reduced systolic function, with an ejection fraction of 40-45%. The cavity size was normal. There is apical septal, inferior and inferolateral akinesis. There is mid and apical anterior and mid anteroseptal  akinesis. Small, fixed thrombus on the apical wall of the left ventricle. Left ventricular diastolic Doppler parameters are consistent with pseudonormalization. Normal left ventricular filling pressures  2. The right ventricle has normal systolic function. The cavity was normal. There is no increase in right ventricular wall thickness. Right ventricular systolic pressure could not be assessed.  3. Left atrial size was mildly dilated.  4. The aortic valve is grossly normal. Mild sclerosis of the aortic valve.  Poorly visualized but likely trileaflet.  5. The aorta is normal unless otherwise noted.  6. The inferior vena cava was normal in size with <50% respiratory variability.  Coronary/Graft Acute MI Revascularization  03/12/2019  LEFT HEART CATH AND CORONARY ANGIOGRAPHY  Conclusion    Prox LAD to Mid LAD lesion is 100% stenosed.  There is moderate left ventricular systolic dysfunction.  LV end diastolic pressure is mildly elevated.  The left ventricular ejection fraction is 35-45% by visual estimate.   1. Single vessel occlusive CAD involving the proximal LAD 2. Moderate LV dysfunction EF 40-45% 3. Mildly elevated LVEDP 4. Successful PCI with stenting of the proximal LAD with DES  Plan; will continue IV aggrastat for 2 hours. DAPT for one year. Smoking cessation.     Diagnostic Dominance: Right   History of Present Illness     63 yo WM with history of paroxysmal AFib, COPD, GERD, PAD s/p aorto-iliac stent graft 2 years ago and tobacco smoking presented with acute anterior STEMI.  No prior coronary history. Reported taking Diltiazem for AFib. Was no metoprolol but says this didn't help. Not on anticoagulation. Patient is primarily followed by the [facility] in [place]. Father had angina and died in his 53s. He is active smoker.   He developed acute onset chest pain radiating to his right arm with associated diaphoresis. EKG was called and found to have ST elevated in anterior leads. He had junctional rhythm. Given ASA and SL nitro without relief. Taken to cath alb.   Hospital Course     Consultants: None  1. STEMI - Cath showed single vessel occlusive CAD involving the proximal LAD s/p successful PCI with  stenting with DES. Moderate LV dysfunction EF 40-45%.  Mildly elevated LVEDP. He was treated with ASA and Brillinta. However, Brililnta changed to Plavix with loading dose 2nd to small LV apical thrombus. Triple therapy for 1 month and then stop aspirin and continue Plavix and  warfarin. Ambulated well. CRPII at [facility].   2. LV thrombus - Placed on coumadin. INR 1.1. He will be discharged on warfarin with Lovenox bridge. INR check on Friday at [place] office. He will try to establish long term care at [place]. Echo in few months.  3. Chronic Systolic CHF - Echo shoed  LVEF 40-45%. He is euvolemic. No BB given bradycardia. BP soft to add ACE/ARB. Consider as outpatient. Advise low sodium diet.   4. HLD - 03/12/2019: Cholesterol 104; HDL 31; LDL Cholesterol 59; Triglycerides 70; VLDL 14  - Crestor increase to 40mg  this admission. Hx of statin intolerance but willing to try higher dose.   5. Sinus Bradycardia - Junctional rhythm on arrival. HR was in 40s post PCI which improved to 50s on following day. Ambulated well. Held home Cardizem. Avoid AV blocking agent.   6. PAF - Patient reported hx of afib and on Cardizem which held 2nd to bradycardia. Not on anticoaguauation prior to arrival.  Started coumadin for LV thrombus. No arrhythmias while here. May consider outpatient monitor but he has [employer] insurance.   7. Tobacco abuse - Encouraged cessation. Education given.  He will follow up in [place] until established care at [facility].   Discharge Vitals Blood pressure 108/64, pulse (!) 51, temperature 98.2 F (36.8 C), temperature source Oral, resp. rate 15, height 6\' 3"  (1.905 m), weight 81.1 kg, SpO2 98 %.  Filed Weights   03/12/19 1830 03/13/19 0500 03/14/19 0533  Weight: 83.5 kg 82.1 kg 81.1 kg   Physical Exam  Constitutional: He is well-developed, well-nourished, and in no distress.  HENT:  Head: Normocephalic and atraumatic.  Eyes: Pupils are equal, round, and reactive to light. Conjunctivae are normal.  Neck: Normal range of motion. Neck supple.  Cardiovascular: Normal rate and regular rhythm.  Pulmonary/Chest: Effort normal and breath sounds normal.  Abdominal: Soft. Bowel sounds are normal.  Musculoskeletal: Normal range of motion.  Neurological: He is  alert.  Skin: Skin is warm and dry.  Psychiatric: Affect normal.   Labs & Radiologic Studies    CBC Recent Labs    03/12/19 0906 03/12/19 0937 03/13/19 0241  WBC 12.8*  --  16.4*  NEUTROABS 6.1  --   --   HGB 15.6 13.9 13.8  HCT 46.2 41.0 41.0  MCV 92.0  --  93.2  PLT 235  --  99991111   Basic Metabolic Panel Recent Labs    03/12/19 0906 03/12/19 0937 03/13/19 0241  NA 140 141 138  K 3.6 3.5 4.2  CL 107 108 108  CO2 18*  --  22  GLUCOSE 149* 168* 106*  BUN 17 18 9   CREATININE 1.16 0.80 0.94  CALCIUM 9.5  --  8.6*   Liver Function Tests Recent Labs    03/12/19 0906  AST 19  ALT 17  ALKPHOS 59  BILITOT 0.7  PROT 6.7  ALBUMIN 4.1   No results for input(s): LIPASE, AMYLASE in the last 72 hours. High Sensitivity Troponin:   Recent Labs  Lab 03/12/19 0906 03/12/19 1054 03/12/19 1642  TROPONINIHS 79* 1,296* 8,818*    Hemoglobin A1C Recent Labs    03/12/19 1054  HGBA1C 6.0*   Fasting Lipid Panel Recent  Labs    03/12/19 0906  CHOL 104  HDL 31*  LDLCALC 59  TRIG 70  CHOLHDL 3.4   _____________  Dg Chest Port 1 View  Result Date: 03/12/2019 CLINICAL DATA:  Chest pain. EXAM: PORTABLE CHEST 1 VIEW COMPARISON:  None FINDINGS: Heart size is accentuated by the portable AP technique. There are no focal consolidations or pleural effusions. No pulmonary edema. Surgical clips are seen in the LEFT UPPER QUADRANT of the abdomen. Remote rib fractures. IMPRESSION: No active disease. Electronically Signed   By: Nolon Nations M.D.   On: 03/12/2019 11:45   Disposition   Pt is being discharged home today in good condition.  Follow-up Plans & Appointments    Follow-up Information    Theora Gianotti, NP. Go on 03/28/2019.   Specialties: Nurse Practitioner, Cardiology, Radiology Why: @10am  for hospital follow up   Contact information: [place] [address] [phone]        [place]. Go on  03/17/2019.   Specialty: Cardiology Why: for INR check @10 :30am Contact information: [address] [place] [phone]         Discharge Instructions    Amb Referral to Cardiac Rehabilitation   Complete by: As directed    Diagnosis:  Coronary Stents STEMI PTCA     After initial evaluation and assessments completed: Virtual Based Care may be provided alone or in conjunction with Phase 2 Cardiac Rehab based on patient barriers.: Yes      Discharge Medications   Allergies as of 03/14/2019      Reactions   Statins Other (See Comments)   Leg Cramps   Flunisolide Other (See Comments)   didn't work   Nicotine Polacrilex [nicotine] Other (See Comments)   didn't help stop smoking   Wellbutrin [bupropion] Other (See Comments)   Caused somnolence (and didn't help to stop smoking)      Medication List    STOP taking these medications   diltiazem 120 MG 24 hr capsule Commonly known as: TIAZAC     TAKE these medications   aspirin EC 81 MG tablet Take 1 tablet (81 mg total) by mouth daily.   cholecalciferol 25 MCG (1000 UT) tablet Commonly known as: VITAMIN D Take 1,000 Units by mouth 2 (two) times daily.   clopidogrel 75 MG tablet Commonly known as: PLAVIX Take 1 tablet (75 mg total) by mouth daily. Start taking on: March 15, 2019   diclofenac sodium 1 % Gel Commonly known as: VOLTAREN Apply 1 application topically daily as needed (pain).   enoxaparin 80 MG/0.8ML injection Commonly known as: LOVENOX Inject 0.8 mLs (80 mg total) into the skin every 12 (twelve) hours.   GAS-X PO Take 1 tablet by mouth 3 (three) times daily as needed (heartburn).   nitroGLYCERIN 0.4 MG SL tablet Commonly known as: NITROSTAT Place 1 tablet (0.4 mg total) under the tongue every 5 (five) minutes x 3 doses as needed for chest pain.   oxymetazoline 0.05 % nasal spray Commonly known as: AFRIN Place 1 spray into both nostrils 2 (two) times  daily as needed for congestion.   ROLAIDS PO Take 1 tablet by mouth at bedtime as needed (heartburn/acid reflux).   rosuvastatin 40 MG tablet Commonly known as: CRESTOR Take 1 tablet (40 mg total) by mouth at bedtime. What changed:   medication strength  how much to take   warfarin 5 MG tablet Commonly known as:  COUMADIN Take 7.5 mg ( 1 1/2 tables) every night with dinner until INR check on Friday)        Acute coronary syndrome (MI, NSTEMI, STEMI, etc) this admission?: Yes.     AHA/ACC Clinical Performance & Quality Measures: 1. Aspirin prescribed? - Yes 2. ADP Receptor Inhibitor (Plavix/Clopidogrel, Brilinta/Ticagrelor or Effient/Prasugrel) prescribed (includes medically managed patients)? - Yes 3. Beta Blocker prescribed? - No - due to bradycardia 4. High Intensity Statin (Lipitor 40-80mg  or Crestor 20-40mg ) prescribed? - Yes 5. EF assessed during THIS hospitalization? - Yes 6. For EF <40%, was ACEI/ARB prescribed? - No - Reason:  Plan to add as outpatient 7. For EF <40%, Aldosterone Antagonist (Spironolactone or Eplerenone) prescribed? - Not Applicable (EF >/= [id number]) 8. Cardiac Rehab Phase II ordered (Included Medically managed Patients)? - Yes     Outstanding Labs/Studies   Lipid panel and LFTS as outpatient   Duration of Discharge Encounter   Greater than 30 minutes including physician time.  Signed, Leanor Kail, PA 03/14/2019, 11:35 AM  ATTENDING ATTESTATION  I have seen, examined and evaluated the patient prior to discharge.  After reviewing all the available data and chart, we discussed the patients laboratory, study & physical findings as well as symptoms in detail. I agree with the findings, examination as well as discharge summary and recommendations as per our discussion.    Was doing well post PCI.  Unfortunately he did not tolerate beta-blocker because of bradycardia, nor with his blood pressure tolerate addition of ARB.  This can be considered in  the outpatient setting.  LV apical thrombus noted on echocardiogram, was started on warfarin with Lovenox bridging.  Would recommend a minimum 3 months of warfarin at which time would probably could consider converting to [place] for long-term anticoagulation for A. fib given his now only risk factors of both CAD and PAD.  Would discontinue aspirin after 1 month, and continue Plavix  He was fine for discharge, clinically stable.   Glenetta Hew, M.D., M.S. Interventional Cardiologist   Pager # [phone] Phone # [phone] [address] [place], [place] [address]

## 2019-03-14 NOTE — Progress Notes (Signed)
CARDIAC REHAB PHASE I   PRE:  Rate/Rhythm: 6 SB (no ectopy or afib)    BP: sitting 109/64    SaO2:   MODE:  Ambulation: 750 ft   POST:  Rate/Rhythm: 71 SR    BP: sitting 113/72     SaO2:   Tolerated well, no c/o. Sts he feels well. HR stable, no afib or arrhythmias. Discussed exercise, NTG, and Plavix/Coumadin with pt. Gave him Coumadin video to watch. It would be beneficial if he received the Coumadin book and pharmacist education. Encouraged more walking independently. [place], ACSM 03/14/2019 9:18 AM

## 2019-03-17 ENCOUNTER — Other Ambulatory Visit: Payer: Self-pay

## 2019-03-17 ENCOUNTER — Ambulatory Visit (INDEPENDENT_AMBULATORY_CARE_PROVIDER_SITE_OTHER): Payer: No Typology Code available for payment source | Admitting: *Deleted

## 2019-03-17 DIAGNOSIS — I480 Paroxysmal atrial fibrillation: Secondary | ICD-10-CM | POA: Diagnosis not present

## 2019-03-17 DIAGNOSIS — Z5181 Encounter for therapeutic drug level monitoring: Secondary | ICD-10-CM | POA: Diagnosis not present

## 2019-03-17 DIAGNOSIS — I236 Thrombosis of atrium, auricular appendage, and ventricle as current complications following acute myocardial infarction: Secondary | ICD-10-CM

## 2019-03-17 LAB — PROTIME-INR
INR: 4 — ABNORMAL HIGH (ref 0.8–1.2)
Prothrombin Time: 38 seconds — ABNORMAL HIGH (ref 11.4–15.2)

## 2019-03-17 LAB — POCT INR: INR: 6 — AB (ref 2.0–3.0)

## 2019-03-17 NOTE — Patient Instructions (Addendum)
A full discussion of the nature of anticoagulants has been carried out.  A benefit risk analysis has been presented to the patient, so that they understand the justification for choosing anticoagulation at this time. The need for frequent and regular monitoring, precise dosage adjustment and compliance is stressed.  Side effects of potential bleeding are discussed.  The patient should avoid any OTC items containing aspirin or ibuprofen, and should avoid great swings in general diet.  Avoid alcohol consumption.  Call if any signs of abnormal bleeding.   Description   Stop the Lovenox injections. Spoke with pt and instructed to hold today and hold tomorrow's dose then take 1 tablet Sunday and recheck INR on Monday in [place] office. Report to ER with any bleeding problems or falls. Coumadin [place]

## 2019-03-20 ENCOUNTER — Other Ambulatory Visit
Admission: RE | Admit: 2019-03-20 | Discharge: 2019-03-20 | Disposition: A | Discharge status: Home / Self Care | Payer: Non-veteran care | Source: Ambulatory Visit | Attending: Interventional Cardiology | Admitting: Interventional Cardiology

## 2019-03-20 ENCOUNTER — Ambulatory Visit (INDEPENDENT_AMBULATORY_CARE_PROVIDER_SITE_OTHER): Payer: No Typology Code available for payment source

## 2019-03-20 ENCOUNTER — Other Ambulatory Visit: Payer: Self-pay

## 2019-03-20 DIAGNOSIS — I236 Thrombosis of atrium, auricular appendage, and ventricle as current complications following acute myocardial infarction: Secondary | ICD-10-CM | POA: Diagnosis not present

## 2019-03-20 DIAGNOSIS — Z5181 Encounter for therapeutic drug level monitoring: Secondary | ICD-10-CM

## 2019-03-20 DIAGNOSIS — I480 Paroxysmal atrial fibrillation: Secondary | ICD-10-CM | POA: Diagnosis not present

## 2019-03-20 LAB — POCT INR: INR: 3.6 — AB (ref 2.0–3.0)

## 2019-03-20 NOTE — Patient Instructions (Signed)
Please skip coumadin tonight, then START NEW DOSAGE of 5 mg every day EXCEPT 2.5 mg on MONDAYS, [place].   Recheck in 1 week.  Report to ER with any bleeding problems or falls. Coumadin [place]

## 2019-03-28 ENCOUNTER — Encounter: Payer: Self-pay | Admitting: Nurse Practitioner

## 2019-03-28 ENCOUNTER — Ambulatory Visit (INDEPENDENT_AMBULATORY_CARE_PROVIDER_SITE_OTHER): Payer: No Typology Code available for payment source | Admitting: General Practice

## 2019-03-28 ENCOUNTER — Emergency Department
Admission: EM | Admit: 2019-03-28 | Discharge: 2019-03-28 | Disposition: A | Discharge status: Home / Self Care | Payer: No Typology Code available for payment source | Attending: Emergency Medicine | Admitting: Emergency Medicine

## 2019-03-28 ENCOUNTER — Other Ambulatory Visit: Payer: Self-pay

## 2019-03-28 ENCOUNTER — Emergency Department: Payer: No Typology Code available for payment source

## 2019-03-28 VITALS — BP 118/62 | HR 52 | Ht 75.0 in | Wt 180.8 lb

## 2019-03-28 DIAGNOSIS — I480 Paroxysmal atrial fibrillation: Secondary | ICD-10-CM | POA: Diagnosis not present

## 2019-03-28 DIAGNOSIS — I4892 Unspecified atrial flutter: Secondary | ICD-10-CM | POA: Insufficient documentation

## 2019-03-28 DIAGNOSIS — Z5181 Encounter for therapeutic drug level monitoring: Secondary | ICD-10-CM | POA: Diagnosis not present

## 2019-03-28 DIAGNOSIS — Z7982 Long term (current) use of aspirin: Secondary | ICD-10-CM | POA: Diagnosis not present

## 2019-03-28 DIAGNOSIS — Z7901 Long term (current) use of anticoagulants: Secondary | ICD-10-CM | POA: Insufficient documentation

## 2019-03-28 DIAGNOSIS — Z79899 Other long term (current) drug therapy: Secondary | ICD-10-CM | POA: Diagnosis not present

## 2019-03-28 DIAGNOSIS — R000 Tachycardia, unspecified: Secondary | ICD-10-CM | POA: Diagnosis present

## 2019-03-28 DIAGNOSIS — I5023 Acute on chronic systolic (congestive) heart failure: Secondary | ICD-10-CM | POA: Diagnosis not present

## 2019-03-28 DIAGNOSIS — E7800 Pure hypercholesterolemia, unspecified: Secondary | ICD-10-CM

## 2019-03-28 DIAGNOSIS — F17210 Nicotine dependence, cigarettes, uncomplicated: Secondary | ICD-10-CM | POA: Diagnosis not present

## 2019-03-28 DIAGNOSIS — I252 Old myocardial infarction: Secondary | ICD-10-CM | POA: Insufficient documentation

## 2019-03-28 DIAGNOSIS — I236 Thrombosis of atrium, auricular appendage, and ventricle as current complications following acute myocardial infarction: Secondary | ICD-10-CM

## 2019-03-28 DIAGNOSIS — I2510 Atherosclerotic heart disease of native coronary artery without angina pectoris: Secondary | ICD-10-CM | POA: Insufficient documentation

## 2019-03-28 LAB — CBC
HCT: 45.8 % (ref 39.0–52.0)
Hemoglobin: 15.4 g/dL (ref 13.0–17.0)
MCH: 31 pg (ref 26.0–34.0)
MCHC: 33.6 g/dL (ref 30.0–36.0)
MCV: 92.2 fL (ref 80.0–100.0)
Platelets: 333 10*3/uL (ref 150–400)
RBC: 4.97 MIL/uL (ref 4.22–5.81)
RDW: 13.7 % (ref 11.5–15.5)
WBC: 12.7 10*3/uL — ABNORMAL HIGH (ref 4.0–10.5)
nRBC: 0 % (ref 0.0–0.2)

## 2019-03-28 LAB — BASIC METABOLIC PANEL
Anion gap: 10 (ref 5–15)
BUN: 14 mg/dL (ref 8–23)
CO2: 25 mmol/L (ref 22–32)
Calcium: 9.4 mg/dL (ref 8.9–10.3)
Chloride: 104 mmol/L (ref 98–111)
Creatinine, Ser: 1 mg/dL (ref 0.61–1.24)
GFR calc Af Amer: >60 mL/min (ref >60)
GFR calc non Af Amer: >60 mL/min (ref >60)
Glucose, Bld: 98 mg/dL (ref 70–99)
Potassium: 4 mmol/L (ref 3.5–5.1)
Sodium: 139 mmol/L (ref 135–145)

## 2019-03-28 LAB — TROPONIN I (HIGH SENSITIVITY)
Troponin I (High Sensitivity): 48 ng/L — ABNORMAL HIGH (ref <18)
Troponin I (High Sensitivity): 74 ng/L — ABNORMAL HIGH (ref <18)

## 2019-03-28 LAB — PROTIME-INR
INR: 4.8 (ref 0.8–1.2)
Prothrombin Time: 44.1 seconds — ABNORMAL HIGH (ref 11.4–15.2)

## 2019-03-28 LAB — MAGNESIUM: Magnesium: 2.4 mg/dL (ref 1.7–2.4)

## 2019-03-28 MED ORDER — LOSARTAN POTASSIUM 25 MG PO TABS: 12.5000 mg | ORAL_TABLET | Freq: Every day | ORAL | 2 refills | Status: DC

## 2019-03-28 MED ORDER — SODIUM CHLORIDE 0.9% FLUSH: 3.0000 mL | Freq: Once | INTRAVENOUS | Status: DC

## 2019-03-28 MED ORDER — METOPROLOL SUCCINATE ER 50 MG PO TB24
25.0000 mg | ORAL_TABLET | Freq: Every day | ORAL | Status: DC
  Filled 2019-03-28: qty 1

## 2019-03-28 NOTE — ED Notes (Signed)
Pt reports irregular heart rate and rapid heart rate today.  No chest pain or sob.  stemi 2 weeks ago.  Pt was in cardiac rehab today and saw the PA-C and started on new meds today.  Pt alert.  nsr on monitor.  Skin warm and dry  Iv in place

## 2019-03-28 NOTE — Addendum Note (Signed)
Addended by: Deberah Pelton on: 03/28/2019 01:27 PM   Modules accepted: Level of Service

## 2019-03-28 NOTE — ED Provider Notes (Signed)
[facility] Emergency Department Provider Note   ____________________________________________   First MD Initiated Contact with Patient 03/28/19 1923     (approximate)  I have reviewed the triage vital signs and the nursing notes.   HISTORY  Chief Complaint Tachycardia    HPI ISMAEL MASEDA is a 63 y.o. male with past medical history of CAD and paroxysmal A. fib on Coumadin presents to the ED complaining of palpitations.  Patient reports that he had sudden onset of feeling like his heart was racing earlier today.  He has had this occasionally in the past, but states that he will usually go away on its own.  He was worried because it was more sustained this evening and he checked his blood pressure at home with it being low.  He states he takes Coumadin for his A. fib, but does not currently take any medicine for rate control.  He reports previously being on metoprolol and Cardizem, but recently had his Cardizem stopped for reasons he is unclear on.  He states he has been doing well since recent admission for MI.  With episodes of palpitations, he does describe some mild chest discomfort that seems to resolve when palpitations resolved.        Past Medical History:  Diagnosis Date  . Actinic keratosis   . Acute ST elevation myocardial infarction (STEMI) of anterior wall ([place])    a. 02/2019 s/p PCI/DES ->LAD.  Marland Kitchen Apical mural thrombus following MI ([place])    a. 02/2019 s/p Ant MI-->Echo: Small, fixed thrombus on the apical wall of LV-->coumadin.  . Barrett's esophagus   . CAD (coronary artery disease)    a. 02/2019 Ant STEMI/PCI: LM nl, LAD 100p (4x18 Resolute Oxynx DES), LCX/RCA min irregs, EF 35-45%.  . Chronic back pain   . Colon polyps    a. 07/2017 Colonoscopy: 13mm sigmoid polyp, tubular adenoma, diverticulosis, int hemorrhoids.  Marland Kitchen COPD (chronic obstructive pulmonary disease) ([place])   . Degeneration of lumbar intervertebral disc   . Dysphonia   . GERD  (gastroesophageal reflux disease)   . HFrEF (heart failure with reduced ejection fraction) ([place])    a. 02/2019 Echo: EF 40-45%, apical, septal, inf, and inforlateral AK. Mid and apical anterior/mid anteroseptal AK. Mildly dil LA.  Marland Kitchen Hydrocele   . Hyperlipidemia   . Ischemic cardiomyopathy    a. 02/2019 Echo: EF 40-45%.  . Myalgia   . Osteoarthritis    C-spine  . PAF (paroxysmal atrial fibrillation) ([facility])    a. CHA2DS2VASc = 2.  . Peripheral vascular disease ([place])    a. 2018 s/p aorto-iliac stent graft.  . Reactive airway disease   . Seborrheic keratosis   . Sinus bradycardia    a. 02/2019 Jxnl rhythm and sinus brady in setting of Ant MI-->[facility] d/c'd.  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  . Tobacco use disorder   . Tremor     Patient Active Problem List   Diagnosis Date Noted  . Encounter for therapeutic drug monitoring 03/17/2019  . Coronary artery disease involving native coronary artery of native heart with unstable angina pectoris ([place]) 03/13/2019  . Presence of drug coated stent in LAD coronary artery 03/13/2019  . Tobacco abuse 03/13/2019  . Left ventricular thrombus following MI ([place]) 03/13/2019  . Sinus bradycardia, persistent 03/13/2019  . PAF (paroxysmal atrial fibrillation) ([place])   . Acute ST elevation myocardial infarction (STEMI) of anterior wall ([place]) 03/12/2019  . SBO (small bowel  obstruction) ([place]) 05/29/2018  . Columnar epithelial-lined lower esophagus   . Special screening for malignant neoplasms, colon   . Polyp of sigmoid colon   . Diverticulosis of large intestine without diverticulitis   . Hypocalcemia 02/06/2017    Past Surgical History:  Procedure Laterality Date  . [facility]  . [facility]ON[facility]COPY    . COLONOSCOPY WITH PROPOFOL N/A 08/05/2017   Procedure: COLONOSCOPY WITH PROPOFOL;  Surgeon: Virgel Manifold, MD;  Location: [facility] ENDOSCOPY;  Service: Endoscopy;  Laterality: N/A;  . CORONARY/GRAFT ACUTE MI  REVASCULARIZATION N/A 03/12/2019   Procedure: Coronary/Graft Acute MI Revascularization;  Surgeon: Martinique, Peter M, MD;  Location: [place] [facility];  Service: Cardiovascular;  Laterality: N/A;  . ESOPHAGOGASTRODUODENOSCOPY (EGD) WITH PROPOFOL N/A 08/05/2017   Procedure: ESOPHAGOGASTRODUODENOSCOPY (EGD) WITH PROPOFOL;  Surgeon: Virgel Manifold, MD;  Location: [facility] ENDOSCOPY;  Service: Endoscopy;  Laterality: N/A;  . LEFT HEART CATH AND CORONARY ANGIOGRAPHY N/A 03/12/2019   Procedure: LEFT HEART CATH AND CORONARY ANGIOGRAPHY;  Surgeon: Martinique, Peter M, MD;  Location: [place] [facility];  Service: Cardiovascular;  Laterality: N/A;  . NASAL SINUS SURGERY     several  . SPLENECTOMY  01/2017    Prior to Admission medications   Medication Sig Start Date End Date Taking? Authorizing Provider  aspirin EC 81 MG tablet Take 1 tablet (81 mg total) by mouth daily. 03/14/19 04/13/19  Bhagat, Crista Luria, PA  Ca Carbonate-Mag Hydroxide (ROLAIDS PO) Take 1 tablet by mouth at bedtime as needed (heartburn/acid reflux).    [provider]  cholecalciferol (VITAMIN D) 25 MCG (1000 UT) tablet Take 1,000 Units by mouth 2 (two) times daily. 03/08/19   [provider]  clopidogrel (PLAVIX) 75 MG tablet Take 1 tablet (75 mg total) by mouth daily. 03/15/19   Bhagat, Crista Luria, PA  diclofenac sodium (VOLTAREN) 1 % GEL Apply 1 application topically daily as needed (pain).    [provider]  losartan (COZAAR) 25 MG tablet Take 0.5 tablets (12.5 mg total) by mouth daily. 03/28/19 06/26/19  Theora Gianotti, NP  nitroGLYCERIN (NITROSTAT) 0.4 MG SL tablet Place 1 tablet (0.4 mg total) under the tongue every 5 (five) minutes x 3 doses as needed for chest pain. 03/14/19   Bhagat, Crista Luria, PA  oxymetazoline (AFRIN) 0.05 % nasal spray Place 1 spray into both nostrils 2 (two) times daily as needed for congestion.    [provider]  rosuvastatin (CRESTOR) 40 MG tablet Take 1 tablet  (40 mg total) by mouth at bedtime. 03/14/19   Bhagat, Crista Luria, PA  Simethicone (GAS-X PO) Take 1 tablet by mouth 3 (three) times daily as needed (heartburn).    [provider]  warfarin (COUMADIN) 5 MG tablet Take 7.5 mg ( 1 1/2 tables) every night with dinner until INR check on Friday) 03/14/19   Bhagat, Crista Luria, PA    Allergies Statins, Flunisolide, Nicotine polacrilex [nicotine], and Wellbutrin [bupropion]  No family history on file.  Social History Social History   Tobacco Use  . Smoking status: Current Every Day Smoker    Packs/day: 1.00    Years: 40.00    Pack years: 40.00    Types: Cigarettes  . Smokeless tobacco: Never Used  Substance Use Topics  . Alcohol use: Yes    Comment: rare - 1-2x/yr  . Drug use: No    Review of Systems  Constitutional: No fever/chills Eyes: No visual changes. ENT: No sore throat. Cardiovascular: Positive for chest pain and  palpitations. Respiratory: Denies shortness of breath. Gastrointestinal: No abdominal pain.  No nausea, no vomiting.  No diarrhea.  No constipation. Genitourinary: Negative for dysuria. Musculoskeletal: Negative for back pain. Skin: Negative for rash. Neurological: Negative for headaches, focal weakness or numbness.  ____________________________________________   PHYSICAL EXAM:  VITAL SIGNS: ED Triage Vitals  Enc Vitals Group     BP 03/28/19 1913 104/74     Pulse Rate 03/28/19 1913 (!) 152     Resp 03/28/19 1913 16     Temp 03/28/19 1913 98 F (36.7 C)     Temp Source 03/28/19 1913 Oral     SpO2 --      Weight 03/28/19 1914 180 lb (81.6 kg)     Height 03/28/19 1914 6\' 3"  (1.905 m)     Head Circumference --      Peak Flow --      Pain Score 03/28/19 1914 2     Pain Loc --      Pain Edu? --      Excl. in [place]? --     Constitutional: Alert and oriented. Eyes: Conjunctivae are normal. Head: Atraumatic. Nose: No congestion/rhinnorhea. Mouth/Throat: Mucous membranes are moist. Neck:  Normal ROM Cardiovascular: Normal rate, regular rhythm. Grossly normal heart sounds. Respiratory: Normal respiratory effort.  No retractions. Lungs CTAB. Gastrointestinal: Soft and nontender. No distention. Genitourinary: deferred Musculoskeletal: No lower extremity tenderness nor edema. Neurologic:  Normal speech and language. No gross focal neurologic deficits are appreciated. Skin:  Skin is warm, dry and intact. No rash noted. Psychiatric: Mood and affect are normal. Speech and behavior are normal.  ____________________________________________   LABS (all labs ordered are listed, but only abnormal results are displayed)  Labs Reviewed  CBC - Abnormal; Notable for the following components:      Result Value   WBC 12.7 (*)    All other components within normal limits  PROTIME-INR - Abnormal; Notable for the following components:   Prothrombin Time 44.1 (*)    INR 4.8 (*)    All other components within normal limits  TROPONIN I (HIGH SENSITIVITY) - Abnormal; Notable for the following components:   Troponin I (High Sensitivity) 48 (*)    All other components within normal limits  TROPONIN I (HIGH SENSITIVITY) - Abnormal; Notable for the following components:   Troponin I (High Sensitivity) 74 (*)    All other components within normal limits  BASIC METABOLIC PANEL  MAGNESIUM   ____________________________________________  EKG  ED ECG REPORT I, Blake Divine, the attending physician, personally viewed and interpreted this ECG.   Date: 03/29/2019  Rate: 152  Rhythm: atrial fibrillation, rate 152  Axis: Normal  Intervals:none  ST&T Change: None   PROCEDURES  Procedure(s) performed (including Critical Care):  Procedures   ____________________________________________   INITIAL IMPRESSION / ASSESSMENT AND PLAN / ED COURSE       63 year old male with history of CAD and paroxysmal A. fib presents to the ED complaining of episodes of palpitations occurring  intermittently this evening.  Initial EKG shows atrial flutter, however this resolved by the time patient was placed in her room.  He states he had some chest discomfort with the flutter, but denies any ongoing chest pain after this has resolved.  Initial troponin mildly elevated but significantly improved following his recent MI, doubt ACS given only mild chest discomfort with palpitations.  Case discussed with Dr. Fletcher Anon of cardiology, who recommends starting patient on low-dose metoprolol.  Unfortunately, patient reports having significant bradycardia with  metoprolol in the past.  Discussed possibly starting patient on Cardizem with Dr. Fletcher Anon, however at this point he recommends having patient follow-up with cardiology in the office to determine need for rate control given he currently remains in sinus rhythm.  Repeat troponin is again elevated and slightly higher than previous, however patient remains chest pain-free at this time, doubt ACS.  Offered patient to remain in the ED for check of third set troponin, however he declines this and wishes to follow-up as an outpatient with cardiology.      ____________________________________________   FINAL CLINICAL IMPRESSION(S) / ED DIAGNOSES  Final diagnoses:  Atrial flutter, unspecified type ([place])  Coronary artery disease involving native coronary artery of native heart without angina pectoris     ED Discharge Orders    None       Note:  This document was prepared using Dragon voice recognition software and may include unintentional dictation errors.   Blake Divine, MD 03/29/19 0030

## 2019-03-28 NOTE — Progress Notes (Addendum)
Cardiology Clinic Note   Patient Name: Brian Mcclure Date of Encounter: 03/28/2019  Primary Care Provider:  [place] Primary Cardiologist:  Kate Sable, MD  Patient Profile  Brian Mcclure 63 year old male presents for follow-up status post STEMI on 03/12/2019 with DES x1 to his proximal LAD.   Past Medical History    Past Medical History:  Diagnosis Date   Actinic keratosis    Acute ST elevation myocardial infarction (STEMI) of anterior wall ([place])    a. 02/2019 s/p PCI/DES ->LAD.   Apical mural thrombus following MI ([place])    a. 02/2019 s/p Ant MI-->Echo: Small, fixed thrombus on the apical wall of LV-->coumadin.   Barrett's esophagus    CAD (coronary artery disease)    a. 02/2019 Ant STEMI/PCI: LM nl, LAD 100p (4x18 Resolute Oxynx DES), LCX/RCA min irregs, EF 35-45%.   Chronic back pain    Colon polyps    a. 07/2017 Colonoscopy: 58mm sigmoid polyp, tubular adenoma, diverticulosis, int hemorrhoids.   COPD (chronic obstructive pulmonary disease) (HCC)    Degeneration of lumbar intervertebral disc    Dysphonia    GERD (gastroesophageal reflux disease)    HFrEF (heart failure with reduced ejection fraction) ([place])    a. 02/2019 Echo: EF 40-45%, apical, septal, inf, and inforlateral AK. Mid and apical anterior/mid anteroseptal AK. Mildly dil LA.   Hydrocele    Hyperlipidemia    Ischemic cardiomyopathy    a. 02/2019 Echo: EF 40-45%.   Myalgia    Osteoarthritis    C-spine   PAF (paroxysmal atrial fibrillation) (HCC)    a. CHA2DS2VASc = 2.   Peripheral vascular disease ([place])    a. 2018 s/p aorto-iliac stent graft.   Reactive airway disease    Seborrheic keratosis    Sinus bradycardia    a. 02/2019 Jxnl rhythm and sinus brady in setting of Ant MI-->CCB d/c'd.   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   Tobacco use disorder    Tremor    Past Surgical History:  Procedure Laterality Date   AORTOILIAC BYPASS  Bilateral    [facility]   COLONOSCOPY     COLONOSCOPY WITH PROPOFOL N/A 08/05/2017   Procedure: COLONOSCOPY WITH PROPOFOL;  Surgeon: Virgel Manifold, MD;  Location: [facility] ENDOSCOPY;  Service: Endoscopy;  Laterality: N/A;   CORONARY/GRAFT ACUTE MI REVASCULARIZATION N/A 03/12/2019   Procedure: Coronary/Graft Acute MI Revascularization;  Surgeon: Martinique, Peter M, MD;  Location: [place] [facility];  Service: Cardiovascular;  Laterality: N/A;   ESOPHAGOGASTRODUODENOSCOPY (EGD) WITH PROPOFOL N/A 08/05/2017   Procedure: ESOPHAGOGASTRODUODENOSCOPY (EGD) WITH PROPOFOL;  Surgeon: Virgel Manifold, MD;  Location: [facility] ENDOSCOPY;  Service: Endoscopy;  Laterality: N/A;   LEFT HEART CATH AND CORONARY ANGIOGRAPHY N/A 03/12/2019   Procedure: LEFT HEART CATH AND CORONARY ANGIOGRAPHY;  Surgeon: Martinique, Peter M, MD;  Location: [place] [facility];  Service: Cardiovascular;  Laterality: N/A;   NASAL SINUS SURGERY     several   SPLENECTOMY  01/2017    Allergies  Allergies  Allergen Reactions   Statins Other (See Comments)    Leg Cramps   Flunisolide Other (See Comments)    didn't work   Nicotine Polacrilex [Nicotine] Other (See Comments)    didn't help stop smoking   Wellbutrin [Bupropion] Other (See Comments)    Caused somnolence (and didn't help to stop smoking)    History of Present Illness  Brian Mcclure sustained an anterior STEMI and underwent cardiac catheterization on 03/12/2019 .  He received a drug-eluting stentx1 to his proximal LAD.  His estimated LVEF at that time was 40 to 45% and he had mildly elevated LVEDP.  His echocardiogram showed mild to moderate reduced systolic function and ejection fraction of 40 to 45%, apical septal inferior and inferior lateral akinesis.  Mid and apical anterior and mid anterior septal akinesis.  Small fixed thrombus on the apical wall of the left ventricle, grade 1 diastolic dysfunction and left atrial mild dilation.  Plan to continue warfarin for minimum of 3  months and convert to [place] for long-term anticoagulation given his A. fib.  Plan to continue aspirin for 1 month and continue Plavix.  Sinus bradycardia at discharge therefore beta blocking agent was held at that time.  His PMH also includes paroxysmal atrial fibrillation, left ventricular thrombus following MI, sinus bradycardia, mall bowel obstruction, hypocalcemia, diverticulosis, and tobacco abuse.  He presents to the clinic today and states he feels much better.  He is back to his normal activities and has been walking and shooting at the local hunting club.  He is retired but continues to maintain his property and work outside.  He states that for the first 2 days after his cardiac catheterization he felt mild chest discomfort which is resolved.  He states that he is a veteran but would like to continue his care with [facility] until he is ready for [facility] cardiologist to take over his care.  He states he has always been bradycardic even in the [employer] he states they would make him do physical activity prior to EKG so they would not have to explain a heart rate less than 60.  He is concerned about his reduced LVEF and states that he came into the hospital within a few hours of feeling chest tightness and arm pain.  He denies chest pain, shortness of breath, lower extremity edema, fatigue, palpitations, melena, hematuria, hemoptysis, diaphoresis, weakness, presyncope, syncope, orthopnea, and PND.   Home Medications    Prior to Admission medications   Medication Sig Start Date End Date Taking? Authorizing Provider  aspirin EC 81 MG tablet Take 1 tablet (81 mg total) by mouth daily. 03/14/19 04/13/19  Bhagat, Crista Luria, PA  Ca Carbonate-Mag Hydroxide (ROLAIDS PO) Take 1 tablet by mouth at bedtime as needed (heartburn/acid reflux).    [provider]  cholecalciferol (VITAMIN D) 25 MCG (1000 UT) tablet Take 1,000 Units by mouth 2 (two) times daily. 03/08/19   [provider]  clopidogrel  (PLAVIX) 75 MG tablet Take 1 tablet (75 mg total) by mouth daily. 03/15/19   Bhagat, Crista Luria, PA  diclofenac sodium (VOLTAREN) 1 % GEL Apply 1 application topically daily as needed (pain).    [provider]  enoxaparin (LOVENOX) 80 MG/0.8ML injection Inject 0.8 mLs (80 mg total) into the skin every 12 (twelve) hours. 03/14/19   Bhagat, Crista Luria, PA  nitroGLYCERIN (NITROSTAT) 0.4 MG SL tablet Place 1 tablet (0.4 mg total) under the tongue every 5 (five) minutes x 3 doses as needed for chest pain. 03/14/19   Bhagat, Crista Luria, PA  oxymetazoline (AFRIN) 0.05 % nasal spray Place 1 spray into both nostrils 2 (two) times daily as needed for congestion.    [provider]  rosuvastatin (CRESTOR) 40 MG tablet Take 1 tablet (40 mg total) by mouth at bedtime. 03/14/19   Bhagat, Crista Luria, PA  Simethicone (GAS-X PO) Take 1 tablet by mouth 3 (three) times daily as needed (heartburn).    [provider]  warfarin (COUMADIN) 5  MG tablet Take 7.5 mg ( 1 1/2 tables) every night with dinner until INR check on Friday) 03/14/19   Leanor Kail, PA    Family History    History reviewed. No pertinent family history. has no family status information on file.   Social History    Social History   Socioeconomic History   Marital status: Married    Spouse name: Not on file   Number of children: Not on file   Years of education: Not on file   Highest education level: Not on file  Occupational History   Not on file  Social Needs   Financial resource strain: Not hard at all   Food insecurity    Worry: Never true    Inability: Never true   Transportation needs    Medical: No    Non-medical: No  Tobacco Use   Smoking status: Current Every Day Smoker    Packs/day: 1.00    Years: 40.00    Pack years: 40.00    Types: Cigarettes   Smokeless tobacco: Never Used  Substance and Sexual Activity   Alcohol use: Yes    Comment: rare - 1-2x/yr   Drug use: No    Sexual activity: Yes  Lifestyle   Physical activity    Days per week: 7 days    Minutes per session: 120 min   Stress: Not at all  Relationships   Social connections    Talks on phone: More than three times a week    Gets together: More than three times a week    Attends religious service: Patient refused    Active member of club or organization: Patient refused    Attends meetings of clubs or organizations: Patient refused    Relationship status: Married   Intimate partner violence    Fear of current or ex partner: No    Emotionally abused: No    Physically abused: No    Forced sexual activity: No  Other Topics Concern   Not on file  Social History Narrative   Not on file     Review of Systems    General:  No chills, fever, night sweats or weight changes.  Cardiovascular:  No chest pain, dyspnea on exertion, edema, orthopnea, palpitations, paroxysmal nocturnal dyspnea. Dermatological: No rash, lesions/masses Respiratory: No cough, dyspnea Urologic: No hematuria, dysuria Abdominal:   No nausea, vomiting, diarrhea, bright red blood per rectum, melena, or hematemesis Neurologic:  No visual changes, wkns, changes in mental status. All other systems reviewed and are otherwise negative except as noted above.  Physical Exam    VS:  BP 118/62 (BP Location: Left Arm, Patient Position: Sitting, Cuff Size: Normal)    Pulse (!) 52    Ht 6\' 3"  (1.905 m)    Wt 180 lb 12 oz (82 kg)    BMI 22.59 kg/m  , BMI Body mass index is 22.59 kg/m. GEN: Well nourished, well developed, in no acute distress. HEENT: normal. Neck: Supple, no JVD, carotid bruits, or masses. Cardiac: RRR, no murmurs, rubs, or gallops. No clubbing, cyanosis, edema.  Radials/DP/PT 2+ and equal bilaterally.  Respiratory:  Respirations regular and unlabored, clear to auscultation bilaterally. GI: Soft, nontender, nondistended, BS + x 4. MS: no deformity or atrophy. Skin: warm and dry, no rash. Neuro:  Strength and  sensation are intact. Psych: Normal affect.  Accessory Clinical Findings    ECG personally reviewed by me today-sinus bradycardia with premature atrial complexes 52 bpm- No acute changes  EKG 03/14/2019 Sinus bradycardia 54 bpm QTC 561  EKG 03/12/2019 Sinus bradycardia 45 bpm  Echocardiogram 03/13/2019 IMPRESSIONS   1. The left ventricle has mild-moderately reduced systolic function, with an ejection fraction of 40-45%. The cavity size was normal. There is apical septal, inferior and inferolateral akinesis. There is mid and apical anterior and mid anteroseptal  akinesis. Small, fixed thrombus on the apical wall of the left ventricle. Left ventricular diastolic Doppler parameters are consistent with pseudonormalization. Normal left ventricular filling pressures  2. The right ventricle has normal systolic function. The cavity was normal. There is no increase in right ventricular wall thickness. Right ventricular systolic pressure could not be assessed.  3. Left atrial size was mildly dilated.  4. The aortic valve is grossly normal. Mild sclerosis of the aortic valve. Poorly visualized but likely trileaflet.  5. The aorta is normal unless otherwise noted.  6. The inferior vena cava was normal in size with <50% respiratory variability.  Cardiac catheterization 03/12/2019   Prox LAD to Mid LAD lesion is 100% stenosed.  There is moderate left ventricular systolic dysfunction.  LV end diastolic pressure is mildly elevated.  The left ventricular ejection fraction is 35-45% by visual estimate.   1. Single vessel occlusive CAD involving the proximal LAD 2. Moderate LV dysfunction EF 40-45% 3. Mildly elevated LVEDP 4. Successful PCI with stenting of the proximal LAD with DES  Plan; will continue IV aggrastat for 2 hours. DAPT for one year. Smoking cessation.  Assessment & Plan   1.  Anterior STEMI- cardiac catheterization on 03/12/2019 with DES x1 to proximal LAD.  Apical thrombus, and  bradycardia.  His EKG today shows sinus bradycardia with premature atrial complexes 52 bpm.  No chest pain and has started walking around his local shooting club.Wishes to continue care with [facility] for not and will transfer care to [facility] at a later date.  Recommend repeat echocardiogram in 3 months  Continue aspirin 81 mg daily-Stop 9/23 Continue Plavix 75 mg daily Continue nitroglycerin 0.4 mg sublingual tablet as needed Continue rosuvastatin 40 mg tablet daily Low-sodium heart healthy diet Increase physical activity as tolerated Start losartan 12.5 mg daily BMP in 1 week Recommend CMP/LFTs in 1 month Start cardiac rehab  2.LV thrombus- Placed on Warfarin 7.5 mg daily Monitor INR-has INR recheck tomorrow 03/29/2019 Stop aspirin on 04/12/2019  3.  Chronic systolic congestive heart failure-echocardiogram LVEF 40 to 45%, bradycardic at discharge no beta-blocker added. Start losartan 12.5 mg daily Low-sodium heart healthy diet Increase physical activity as tolerated Recommend repeat echocardiogram in 3 months   4.  Hyperlipidemia- 03/12/2019: Cholesterol 104; HDL 31; LDL Cholesterol 59; Triglycerides 70; VLDL 14 Continue rosuvastatin 40 mg tablet daily Low-sodium heart healthy diet Increase physical activity as tolerated Recommend CMP/LFTs in 1 month   Disposition: Follow-up with Dr.Agbor-Etang  in 1 month.  Deberah Pelton, NP-C 03/28/2019, 11:41 AM

## 2019-03-28 NOTE — ED Notes (Signed)
Pt signed esignature.  D/c  inst to pt.  

## 2019-03-28 NOTE — ED Notes (Signed)
Pt refused meds.  md aware.

## 2019-03-28 NOTE — ED Notes (Signed)
ED Provider at bedside. 

## 2019-03-28 NOTE — ED Triage Notes (Signed)
Pt to the er for tachycardia and low BP. Pt reports pain midsternal with deep inspiration. Vital signs in triage ar e stable however pt heart then increased from 77 to 152. Pt had a stemi 16 days ago and received one stent. Pt followed up with cardiologist today.

## 2019-03-28 NOTE — Patient Instructions (Signed)
Medication Instructions:  Your physician has recommended you make the following change in your medication:  1- STOP taking Aspirin on April 12, 2019. 2- START Losartan 12.5 mg by mouth once a day.  If you need a refill on your cardiac medications before your next appointment, please call your pharmacy.   Lab work: Your physician recommends that you return for lab work in: [place] 04/04/19 AT [place]. (BMET). - No appointment is needed. Please go anytime between 7 am and 6 pm.  - Please go to the [facility]. You will check in at the front desk to the right as you walk into the atrium. Valet Parking is offered if needed.   If you have labs (blood work) drawn today and your tests are completely normal, you will receive your results only by: Marland Kitchen MyChart Message (if you have MyChart) OR . A paper copy in the mail If you have any lab test that is abnormal or we need to change your treatment, we will call you to review the results.  Testing/Procedures: - None ordered.   Follow-Up: At [facility], you and your health needs are our priority.  As part of our continuing mission to provide you with exceptional heart care, we have created designated Provider Care Teams.  These Care Teams include your primary Cardiologist (physician) and Advanced Practice Providers (APPs -  Physician Assistants and Nurse Practitioners) who all work together to provide you with the care you need, when you need it. You will need a follow up appointment in 1 months.  Please call our office 2 months in advance to schedule this appointment.  You may see Dr Kate Sable or one of the following Advanced Practice Providers on your designated Care Team:   Murray Hodgkins, NP Christell Faith, PA-C . Marrianne Mood, PA-C

## 2019-03-29 ENCOUNTER — Ambulatory Visit (INDEPENDENT_AMBULATORY_CARE_PROVIDER_SITE_OTHER): Payer: No Typology Code available for payment source

## 2019-03-29 ENCOUNTER — Encounter: Payer: Self-pay | Admitting: Cardiology

## 2019-03-29 ENCOUNTER — Telehealth: Payer: Self-pay | Admitting: Cardiology

## 2019-03-29 DIAGNOSIS — I480 Paroxysmal atrial fibrillation: Secondary | ICD-10-CM | POA: Diagnosis not present

## 2019-03-29 DIAGNOSIS — Z5181 Encounter for therapeutic drug level monitoring: Secondary | ICD-10-CM | POA: Diagnosis not present

## 2019-03-29 DIAGNOSIS — I236 Thrombosis of atrium, auricular appendage, and ventricle as current complications following acute myocardial infarction: Secondary | ICD-10-CM

## 2019-03-29 LAB — POCT INR: INR: 5.8 — AB (ref 2.0–3.0)

## 2019-03-29 NOTE — Telephone Encounter (Signed)
Spoke with the patient. Patient was seen in the ED yesterday for PAF/ Aflutter. Patient sts that once there he converted to NSR.  He was on Diltiazem for years for his PAF. Diltiazem was d/c after his recent MI due bradycardia..  Patient is currently asymptomatic and denies any reoccurrence of AFIB/Flutter since being seen in the ED yesterday. He is concerned that he will have increased episodes of Afib not being on diltiazem or a BB. He is concerned about the effect Afib will have on his ventriculs.  Attempted to explain the reasoning for d/c of diltiazem and not initiating a BB, also explained the reasoning for initiating the low dose ARB (losartan).   Patient is scheduled to see Christell Faith, PA on 9/18 to discuss management of rate/rhythm control. Patient has several questions regarding the plan of care. Phone call lasting >62min. Adv the patient that it would be more beneficial for him to discuss his questions and his concerns with the provider at his upcoming appt.  Patient seems quite anxious .He is concerned about what to do in the interim. Adv the patient that I will fwd the update to [facility] and we will call back with his recommendation.  Patient agreeable with the plan.

## 2019-03-29 NOTE — Patient Instructions (Signed)
Please skip coumadin tonight & tomorrow, then START NEW DOSAGE of 2.5 mg every day. Recheck in 1 week.  Report to ER with any bleeding problems or falls. Coumadin [place]

## 2019-03-29 NOTE — Telephone Encounter (Signed)
Returned call to patient to make him aware that we are unable to make RN appt at this time. Reviewed suggestions again from Ignacia Bayley, NP. Pt continues to refuse POC. He would like to wait until appt on 9/18 to discuss further. Pt agreed that he would call back if he has any further sx, qtns or concerns.   Routing to Ignacia Bayley, NP as Juluis Rainier.

## 2019-03-29 NOTE — Telephone Encounter (Signed)
HR 52 in sinus rhythm yesterday in clinic. EF ECG not available.  Was also bradycardic during recent hospitalization, thus preventing resumption of  blocker or dilt.  Case was discussed w/ Dr. Fletcher Anon last night (ER doc called him). Difficult situation due to baseline bradycardia.  Could try a very low dose of metoprolol xl 12.5mg  daily but would have to place zio AT for a week to assess rhythm and rates. Diltiazem not ideal in setting LV dysfunction following MI.   I think he will benefit most from referral to Afib clinic in [place] to determine what other alternatives exist and explore antiarrhythmic options if necessary.

## 2019-03-29 NOTE — Telephone Encounter (Signed)
EKG from yesterdays visit is scanned in.

## 2019-03-29 NOTE — Telephone Encounter (Signed)
I will route to Coletta Memos, Np and Ignacia Bayley NP as they saw the patient yesterday. Please try and get the EKG scanned into Epic as he was sinus brady at his visit with them.

## 2019-03-29 NOTE — Telephone Encounter (Signed)
Spoke to patient, he reports that he "was doing fine on diltiazem" and metoprolol makes him feel poorly. "I might as well just be ready to nap for the rest of the day".  Pt refusing a fib clinic at this time.  Pt reports that he will be coming into clinic next week for coumadin check, would like nurse visit at that time for possible monitor placement at that time. I will check with admin to see if that is possible as nurse visits were put on hold d/t CV19 restrictions.  Advised pt to call for any further questions or concerns, otherwise, I will call with updates.

## 2019-03-29 NOTE — Telephone Encounter (Signed)
Pt c/o medication issue:  1. Name of Medication: Diltiazem   2. How are you currently taking this medication (dosage and times per day)? Holding because someone dc when patient in hospital for MI  3. Are you having a reaction (difficulty breathing--STAT)? no  4. What is your medication issue? See ER visit note for [place] .  Per note patient to come to office to discuss rate control.  Patient wants asap .  Scheduled 9/18 and added to waitlist

## 2019-04-05 ENCOUNTER — Ambulatory Visit (INDEPENDENT_AMBULATORY_CARE_PROVIDER_SITE_OTHER): Payer: No Typology Code available for payment source

## 2019-04-05 ENCOUNTER — Other Ambulatory Visit: Payer: Self-pay

## 2019-04-05 DIAGNOSIS — I236 Thrombosis of atrium, auricular appendage, and ventricle as current complications following acute myocardial infarction: Secondary | ICD-10-CM | POA: Diagnosis not present

## 2019-04-05 DIAGNOSIS — I480 Paroxysmal atrial fibrillation: Secondary | ICD-10-CM | POA: Diagnosis not present

## 2019-04-05 DIAGNOSIS — Z5181 Encounter for therapeutic drug level monitoring: Secondary | ICD-10-CM | POA: Diagnosis not present

## 2019-04-05 LAB — POCT INR: INR: 2.7 (ref 2.0–3.0)

## 2019-04-05 NOTE — Progress Notes (Deleted)
Cardiology Office Note    Date:  04/05/2019   ID:  [name], DOB April 07, 1956, MRN [id number]  PCP:  [facility] [place]  Cardiologist:  Brian Sable, MD  Electrophysiologist:  None   Chief Complaint: Follow up  History of Present Illness:   Brian Mcclure is a 63 y.o. male with history of CAD with recent anterior ST elevation MI status post PCI/DES to the LAD in 02/2019, HFrEF secondary to ICM, apical mural thrombus following MI on Coumadin, reported PAF, bradycardia/junctional rhythm, PAD status post aortoiliac stenting/grafting in 2018 COPD secondary to tobacco abuse, hyperlipidemia, Barrett's esophagus, and chronic back pain who presents for follow-up of his CAD/ICM/PAF.  Patient has a reported history of PAF though was not on anticoagulation upon his recent admission to [facility].  Patient was recently mated to [facility] in 02/2019 with anterior ST elevation MI with noted junctional rhythm.  Emergent cardiac cath showed single-vessel occlusive CAD involving the proximal LAD status post successful PCI/DES.  LV gram showed an EF estimated at 35 to 45% with mildly elevated LVEDP.  Post procedure echo showed an EF of 40 to 45% with apical septal, inferior, inferolateral, mid apical, anterior, mid anteroseptal akinesis, small fixed thrombus on the apical wall of the left ventricle, diastolic dysfunction, normal RV systolic function and cavity size, mildly dilated left atrium, aortic valve likely trileaflet with mild sclerosis.  High-sensitivity troponin peaked at 8818.  Post intervention patient's heart rate improved though remains mildly bradycardic in the 50s bpm leading to recommendation to hold AV nodal blocking agents.  There was no evidence of A. fib during his admission.  She was placed on Coumadin given her apical mural thrombus with a discharge INR 1.1 and was placed on Lovenox bridge.  He was seen in follow-up on 03/28/2019 and reported he was feeling much better.   However, later that day he developed palpitations and was seen in the [facility] ED with EKG showing atrial flutter with RVR heart rate of 153 bpm, and nonspecific ST-T changes.  High-sensitivity troponin initially 48 with a delta of 74.  Otherwise, labs were unrevealing.  It was recommended the patient start low-dose metoprolol though given his history of bradycardia at the patient declined this.  In telephone follow-up, the patient has refused evaluation at the A. fib clinic.  He continued to refuse beta-blocker.  He has been followed by the Coumadin clinic recent supratherapeutic INRs, though most recently checked on 04/04/2028 and felt to be at goal with a value of of 2.7.  ***   Labs: 04/05/2019 - INR 2.7 03/2019 - Hgb 15.4, PLT 333, potassium 4.0, serum creatinine 1.00, magnesium 2.4 02/2019 - A1c 6.0, total cholesterol 1 4, triglycerides 70, HDL 31, LDL 59, albumin 4.1, AST/LT normal  Past Medical History:  Diagnosis Date  . Actinic keratosis   . Acute ST elevation myocardial infarction (STEMI) of anterior wall ([place])    a. 02/2019 s/p PCI/DES ->LAD.  Marland Kitchen Apical mural thrombus following MI ([place])    a. 02/2019 s/p Ant MI-->Echo: Small, fixed thrombus on the apical wall of LV-->coumadin.  . Barrett's esophagus   . CAD (coronary artery disease)    a. 02/2019 Ant STEMI/PCI: LM nl, LAD 100p (4x18 Resolute Onyx DES), LCX/RCA min irregs, EF 35-45%.  . Chronic back pain   . Colon polyps    a. 07/2017 Colonoscopy: 71mm sigmoid polyp, tubular adenoma, diverticulosis, int hemorrhoids.  Marland Kitchen COPD (chronic obstructive pulmonary disease) ([place])   . Degeneration  of lumbar intervertebral disc   . Dysphonia   . GERD (gastroesophageal reflux disease)   . HFrEF (heart failure with reduced ejection fraction) ([place])    a. 02/2019 Echo: EF 40-45%, apical, septal, inf, and inforlateral AK. Mid and apical anterior/mid anteroseptal AK. Mildly dil LA.  Marland Kitchen Hydrocele   . Hyperlipidemia   . Ischemic cardiomyopathy    a. 02/2019  Echo: EF 40-45%.  . Myalgia   . Osteoarthritis    C-spine  . PAF (paroxysmal atrial fibrillation) ([facility])    a. CHA2DS2VASc = 2.  . Peripheral vascular disease ([place])    a. 2018 s/p aorto-iliac stent graft.  . Reactive airway disease   . Seborrheic keratosis   . Sinus bradycardia    a. 02/2019 Jxnl rhythm and sinus brady in setting of Ant MI-->[facility] d/c'd.  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  . Tobacco use disorder   . Tremor     Past Surgical History:  Procedure Laterality Date  . [facility]    [facility]  . COLONOSCOPY    . COLONOSCOPY WITH PROPOFOL N/A 08/05/2017   Procedure: COLONOSCOPY WITH PROPOFOL;  Surgeon: Brian Manifold, MD;  Location: [facility] ENDOSCOPY;  Service: Endoscopy;  Laterality: N/A;  . CORONARY/GRAFT ACUTE MI REVASCULARIZATION N/A 03/12/2019   Procedure: Coronary/Graft Acute MI Revascularization;  Surgeon: Martinique, Peter M, MD;  Location: [facility] CV LAB;  Service: Cardiovascular;  Laterality: N/A;  . ESOPHAGOGASTRODUODENOSCOPY (EGD) WITH PROPOFOL N/A 08/05/2017   Procedure: ESOPHAGOGASTRODUODENOSCOPY (EGD) WITH PROPOFOL;  Surgeon: Brian Manifold, MD;  Location: [facility] ENDOSCOPY;  Service: Endoscopy;  Laterality: N/A;  . LEFT HEART CATH AND CORONARY ANGIOGRAPHY N/A 03/12/2019   Procedure: LEFT HEART CATH AND CORONARY ANGIOGRAPHY;  Surgeon: Martinique, Peter M, MD;  Location: [place] CV LAB;  Service: Cardiovascular;  Laterality: N/A;  . NASAL SINUS SURGERY     several  . SPLENECTOMY  01/2017    Current Medications: No outpatient medications have been marked as taking for the 04/07/19 encounter (Appointment) with Rise Mu, PA-C.    Allergies:   Statins, Flunisolide, Nicotine polacrilex [nicotine], and Wellbutrin [bupropion]   Social History   Socioeconomic History  . Marital status: Married    Spouse name: Not on file  . Number of children: Not on file  . Years of education: Not on file  . Highest education  level: Not on file  Occupational History  . Not on file  Social Needs  . Financial resource strain: Not hard at all  . Food insecurity    Worry: Never true    Inability: Never true  . Transportation needs    Medical: No    Non-medical: No  Tobacco Use  . Smoking status: Current Every Day Smoker    Packs/day: 1.00    Years: 40.00    Pack years: 40.00    Types: Cigarettes  . Smokeless tobacco: Never Used  Substance and Sexual Activity  . Alcohol use: Yes    Comment: rare - 1-2x/yr  . Drug use: No  . Sexual activity: Yes  Lifestyle  . Physical activity    Days per week: 7 days    Minutes per session: 120 min  . Stress: Not at all  Relationships  . Social connections    Talks on phone: More than three times a week    Gets together: More than three times a week    Attends religious service: Patient refused    Active  member of club or organization: Patient refused    Attends meetings of clubs or organizations: Patient refused    Relationship status: Married  Other Topics Concern  . Not on file  Social History Narrative  . Not on file     Family History:  The patient's family history is not on file.  ROS:   ROS   EKGs/Labs/Other Studies Reviewed:    Studies reviewed were summarized above. The additional studies were reviewed today:  LHC 03/12/2019:  Prox LAD to Mid LAD lesion is 100% stenosed.  There is moderate left ventricular systolic dysfunction.  LV end diastolic pressure is mildly elevated.  The left ventricular ejection fraction is 35-45% by visual estimate.   1. Single vessel occlusive CAD involving the proximal LAD 2. Moderate LV dysfunction EF 40-45% 3. Mildly elevated LVEDP 4. Successful PCI with stenting of the proximal LAD with DES  Plan; will continue IV aggrastat for 2 hours. DAPT for one year. Smoking cessation. __________  2D Echo 03/13/2019: 1. The left ventricle has mild-moderately reduced systolic function, with an ejection fraction  of 40-45%. The cavity size was normal. There is apical septal, inferior and inferolateral akinesis. There is mid and apical anterior and mid anteroseptal  akinesis. Small, fixed thrombus on the apical wall of the left ventricle. Left ventricular diastolic Doppler parameters are consistent with pseudonormalization. Normal left ventricular filling pressures  2. The right ventricle has normal systolic function. The cavity was normal. There is no increase in right ventricular wall thickness. Right ventricular systolic pressure could not be assessed.  3. Left atrial size was mildly dilated.  4. The aortic valve is grossly normal. Mild sclerosis of the aortic valve. Poorly visualized but likely trileaflet.  5. The aorta is normal unless otherwise noted.  6. The inferior vena cava was normal in size with <50% respiratory variability.   EKG:  EKG is ordered today.  The EKG ordered today demonstrates ***  Recent Labs: 03/12/2019: ALT 17 03/28/2019: BUN 14; Creatinine, Ser 1.00; Hemoglobin 15.4; Magnesium 2.4; Platelets 333; Potassium 4.0; Sodium 139  Recent Lipid Panel    Component Value Date/Time   CHOL 104 03/12/2019 0906   TRIG 70 03/12/2019 0906   HDL 31 (L) 03/12/2019 0906   CHOLHDL 3.4 03/12/2019 0906   VLDL 14 03/12/2019 0906   LDLCALC 59 03/12/2019 0906    PHYSICAL EXAM:    VS:  There were no vitals taken for this visit.  BMI: There is no height or weight on file to calculate BMI.  Physical Exam  Wt Readings from Last 3 Encounters:  03/28/19 180 lb (81.6 kg)  03/28/19 180 lb 12 oz (82 kg)  03/14/19 178 lb 12.8 oz (81.1 kg)     ASSESSMENT & PLAN:   1. ***  Disposition: F/u with Dr. Marland Kitchen or an APP in ***.   Medication Adjustments/Labs and Tests Ordered: Current medicines are reviewed at length with the patient today.  Concerns regarding medicines are outlined above. Medication changes, Labs and Tests ordered today are summarized above and listed in the Patient Instructions  accessible in Encounters.   Signed, Christell Faith, PA-C 04/05/2019 9:17 AM     [address] [place], [place] [address] [phone]

## 2019-04-05 NOTE — Patient Instructions (Signed)
Please continue dosage of warfarin 2.5 mg every day. Recheck in 2 weeks.  Report to ER with any bleeding problems or falls. Coumadin [place]

## 2019-04-07 ENCOUNTER — Ambulatory Visit: Payer: Non-veteran care | Admitting: Physician Assistant

## 2019-04-07 ENCOUNTER — Encounter

## 2019-04-17 ENCOUNTER — Other Ambulatory Visit: Payer: Self-pay

## 2019-04-17 ENCOUNTER — Encounter: Payer: No Typology Code available for payment source | Attending: Internal Medicine | Admitting: *Deleted

## 2019-04-17 DIAGNOSIS — I213 ST elevation (STEMI) myocardial infarction of unspecified site: Secondary | ICD-10-CM

## 2019-04-17 NOTE — Progress Notes (Signed)
Virtual Initial Oriention completed. Diagnosis can be found in [facility] 8/23. EP/RD orientation scheduled fro 9/29 at 11 am

## 2019-04-18 ENCOUNTER — Encounter: Payer: No Typology Code available for payment source | Admitting: *Deleted

## 2019-04-18 ENCOUNTER — Telehealth: Payer: Self-pay

## 2019-04-18 DIAGNOSIS — I213 ST elevation (STEMI) myocardial infarction of unspecified site: Secondary | ICD-10-CM

## 2019-04-18 MED ORDER — CLOPIDOGREL BISULFATE 75 MG PO TABS: 75.0000 mg | ORAL_TABLET | Freq: Every day | ORAL | 0 refills | Status: DC

## 2019-04-18 NOTE — Telephone Encounter (Signed)
Estill Bamberg reports that she will hold off on rehab today since he has been off plavix for 3 days and risk of r/t L ventricular thrombus.   Routing to Dr. Garen Lah as Juluis Rainier.

## 2019-04-18 NOTE — Telephone Encounter (Signed)
Incoming staff message from Margaretmary Bayley, with cardiac rehab that patient arrived this am and reported that he had not taken Plavix in 3 days bc medication comes on mail order and has not arrived yet.  She requested medication be sent to local pharmacy to bridge gap until his arrives.   [facility]   Shirlean Mylar made aware via staff message.

## 2019-04-18 NOTE — Progress Notes (Signed)
completed RD initial eval

## 2019-04-19 ENCOUNTER — Ambulatory Visit (INDEPENDENT_AMBULATORY_CARE_PROVIDER_SITE_OTHER): Payer: No Typology Code available for payment source

## 2019-04-19 ENCOUNTER — Other Ambulatory Visit: Payer: Self-pay

## 2019-04-19 DIAGNOSIS — Z5181 Encounter for therapeutic drug level monitoring: Secondary | ICD-10-CM

## 2019-04-19 DIAGNOSIS — I236 Thrombosis of atrium, auricular appendage, and ventricle as current complications following acute myocardial infarction: Secondary | ICD-10-CM | POA: Diagnosis not present

## 2019-04-19 DIAGNOSIS — I480 Paroxysmal atrial fibrillation: Secondary | ICD-10-CM

## 2019-04-19 LAB — POCT INR: INR: 2.6 (ref 2.0–3.0)

## 2019-04-19 NOTE — Patient Instructions (Signed)
Please continue dosage of warfarin 2.5 mg every day. Recheck in 3 weeks.   Report to ER with any bleeding problems or falls. Coumadin [place]

## 2019-04-24 ENCOUNTER — Encounter: Payer: No Typology Code available for payment source | Attending: Internal Medicine

## 2019-04-24 DIAGNOSIS — I213 ST elevation (STEMI) myocardial infarction of unspecified site: Secondary | ICD-10-CM | POA: Insufficient documentation

## 2019-04-26 ENCOUNTER — Encounter: Payer: Self-pay | Admitting: *Deleted

## 2019-04-26 DIAGNOSIS — I213 ST elevation (STEMI) myocardial infarction of unspecified site: Secondary | ICD-10-CM

## 2019-04-26 NOTE — Progress Notes (Signed)
Cardiac Individual Treatment Plan  Patient Details  Name: Brian Mcclure MRN: [id number] Date of Birth: 08-20-1955 Referring Provider:    Initial Encounter Date:   Visit Diagnosis: ST elevation myocardial infarction (STEMI), unspecified artery ([place])  Patient's Home Medications on Admission:  Current Outpatient Medications:  .  Ca Carbonate-Mag Hydroxide (ROLAIDS PO), Take 1 tablet by mouth at bedtime as needed (heartburn/acid reflux)., Disp: , Rfl:  .  cholecalciferol (VITAMIN D) 25 MCG (1000 UT) tablet, Take 1,000 Units by mouth 2 (two) times daily., Disp: , Rfl:  .  clopidogrel (PLAVIX) 75 MG tablet, Take 1 tablet (75 mg total) by mouth daily., Disp: 30 tablet, Rfl: 11 .  clopidogrel (PLAVIX) 75 MG tablet, Take 1 tablet (75 mg total) by mouth daily., Disp: 30 tablet, Rfl: 0 .  diclofenac sodium (VOLTAREN) 1 % GEL, Apply 1 application topically daily as needed (pain)., Disp: , Rfl:  .  famotidine (PEPCID) 20 MG tablet, , Disp: , Rfl:  .  losartan (COZAAR) 25 MG tablet, Take 0.5 tablets (12.5 mg total) by mouth daily., Disp: 45 tablet, Rfl: 2 .  nitroGLYCERIN (NITROSTAT) 0.4 MG SL tablet, Place 1 tablet (0.4 mg total) under the tongue every 5 (five) minutes x 3 doses as needed for chest pain., Disp: 25 tablet, Rfl: 12 .  oxymetazoline (AFRIN) 0.05 % nasal spray, Place 1 spray into both nostrils 2 (two) times daily as needed for congestion., Disp: , Rfl:  .  rosuvastatin (CRESTOR) 40 MG tablet, Take 1 tablet (40 mg total) by mouth at bedtime., Disp: 30 tablet, Rfl: 6 .  Simethicone (GAS-X PO), Take 1 tablet by mouth 3 (three) times daily as needed (heartburn)., Disp: , Rfl:  .  warfarin (COUMADIN) 5 MG tablet, Take 7.5 mg ( 1 1/2 tables) every night with dinner until INR check on Friday), Disp: 30 tablet, Rfl: 0  Past Medical History: Past Medical History:  Diagnosis Date  . Actinic keratosis   . Acute ST elevation myocardial infarction (STEMI) of anterior wall ([place])    a. 02/2019  s/p PCI/DES ->LAD.  Marland Kitchen Apical mural thrombus following MI ([place])    a. 02/2019 s/p Ant MI-->Echo: Small, fixed thrombus on the apical wall of LV-->coumadin.  . Barrett's esophagus   . CAD (coronary artery disease)    a. 02/2019 Ant STEMI/PCI: LM nl, LAD 100p (4x18 Resolute Onyx DES), LCX/RCA min irregs, EF 35-45%.  . Chronic back pain   . Colon polyps    a. 07/2017 Colonoscopy: 109m sigmoid polyp, tubular adenoma, diverticulosis, int hemorrhoids.  .Marland KitchenCOPD (chronic obstructive pulmonary disease) ([facility]   . Degeneration of lumbar intervertebral disc   . Dysphonia   . GERD (gastroesophageal reflux disease)   . HFrEF (heart failure with reduced ejection fraction) ([facility]    a. 02/2019 Echo: EF 40-45%, apical, septal, inf, and inforlateral AK. Mid and apical anterior/mid anteroseptal AK. Mildly dil LA.  .Marland KitchenHydrocele   . Hyperlipidemia   . Ischemic cardiomyopathy    a. 02/2019 Echo: EF 40-45%.  . Myalgia   . Osteoarthritis    C-spine  . PAF (paroxysmal atrial fibrillation) ([facility])    a. CHA2DS2VASc = 2.  . Peripheral vascular disease ([facility]    a. 2018 s/p aorto-iliac stent graft.  . Reactive airway disease   . Seborrheic keratosis   . Sinus bradycardia    a. 02/2019 Jxnl rhythm and sinus brady in setting of Ant MI-->[facility] d/c'd.  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  . Tobacco use disorder   . Tremor     Tobacco Use: Social History   Tobacco Use  Smoking Status Current Every Day Smoker  . Packs/day: 1.00  . Years: 40.00  . Pack years: 40.00  . Types: Cigarettes  Smokeless Tobacco Never Used    Labs: Recent Review Flowsheet Data    Labs for ITP Cardiac and Pulmonary Rehab Latest Ref Rng & Units 03/12/2019   Cholestrol 0 - 200 mg/dL 104   LDLCALC 0 - 99 mg/dL 59   HDL >40 mg/dL 31(L)   Trlycerides <150 mg/dL 70   Hemoglobin A1c 4.8 - 5.6 % 6.0(H)   TCO2 22 - 32 mmol/L 18(L)       Exercise Target Goals: Exercise Program Goal: Individual exercise prescription  set using results from initial 6 min walk test and THRR while considering  patient's activity barriers and safety.   Exercise Prescription Goal: Initial exercise prescription builds to 30-45 minutes a day of aerobic activity, 2-3 days per week.  Home exercise guidelines will be given to patient during program as part of exercise prescription that the participant will acknowledge.  Activity Barriers & Risk Stratification: Activity Barriers & Cardiac Risk Stratification - 04/17/19 1406      Activity Barriers & Cardiac Risk Stratification   Activity Barriers  Joint Problems;Back Problems    Cardiac Risk Stratification  High       6 Minute Walk:   Oxygen Initial Assessment:   Oxygen Re-Evaluation:   Oxygen Discharge (Final Oxygen Re-Evaluation):   Initial Exercise Prescription:   Perform Capillary Blood Glucose checks as needed.  Exercise Prescription Changes:   Exercise Comments:   Exercise Goals and Review:   Exercise Goals Re-Evaluation :   Discharge Exercise Prescription (Final Exercise Prescription Changes):   Nutrition:  Target Goals: Understanding of nutrition guidelines, daily intake of sodium '1500mg'$ , cholesterol '200mg'$ , calories 30% from fat and 7% or less from saturated fats, daily to have 5 or more servings of fruits and vegetables.  Biometrics:    Nutrition Therapy Plan and Nutrition Goals: Nutrition Therapy & Goals - 04/18/19 1209      Nutrition Therapy   Diet  low Na, HH diet    Drug/Food Interactions  Coumadin/Vit K    Protein (specify units)  65g    Fiber  30 grams    Whole Grain Foods  3 servings    Saturated Fats  12 max. grams    Fruits and Vegetables  5 servings/day    Sodium  1.5 grams      Personal Nutrition Goals   Nutrition Goal  ST: eat salads 2x/week LT: feel better    Comments  Pt reports working all day doing chores. Pt reports that he sometimes does eat breakfast, but if he does he will have a bagel and cream cheese or ham  biscuit. L and D is usually chicken and a starch, pt reports liking vegetables. Dessert is usually some chocolate and ~10 vanilla wafers with peanut butter. Discussed HH eating. Pt reports he thinks his diet is ok but he should eat more salad. Discussed consistency and vitaminK and medication.      Intervention Plan   Intervention  Prescribe, educate and counsel regarding individualized specific dietary modifications aiming towards targeted core components such as weight, hypertension, lipid management, diabetes, heart failure and other comorbidities.;Nutrition handout(s) given to patient.    Expected Outcomes  Short Term Goal: Understand basic principles of dietary content, such as calories, fat,  sodium, cholesterol and nutrients.;Short Term Goal: A plan has been developed with personal nutrition goals set during dietitian appointment.;Long Term Goal: Adherence to prescribed nutrition plan.       Nutrition Assessments:   Nutrition Goals Re-Evaluation:   Nutrition Goals Discharge (Final Nutrition Goals Re-Evaluation):   Psychosocial: Target Goals: Acknowledge presence or absence of significant depression and/or stress, maximize coping skills, provide positive support system. Participant is able to verbalize types and ability to use techniques and skills needed for reducing stress and depression.   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 He is hoping being here will help him get stronger and increase his stamina      Family Dynamics   Good Support System?  Yes      Barriers   Psychosocial barriers to participate in program  There are no identifiable barriers or psychosocial needs.;The patient should benefit from training in  stress management and relaxation.      Screening Interventions   Interventions  Encouraged to exercise;Program counselor consult;To provide support and resources with identified psychosocial needs    Expected Outcomes  Short Term goal: Utilizing psychosocial counselor, staff and physician to assist with identification of specific Stressors or current issues interfering with healing process. Setting desired goal for each stressor or current issue identified.;Long Term Goal: Stressors or current issues are controlled or eliminated.;Short Term goal: Identification and review with participant of any Quality of Life or Depression concerns found by scoring the questionnaire.;Long Term goal: The participant improves quality of Life and PHQ9 Scores as seen by post scores and/or verbalization of changes       Quality of Life Scores:   Scores of 19 and below usually indicate a poorer quality of life in these areas.  A difference of  2-3 points is a clinically meaningful difference.  A difference of 2-3 points in the total score of the Quality of Life Index has been associated with significant improvement in overall quality of life, self-image, physical symptoms, and general health in studies assessing change in quality of life.  PHQ-9: Recent Review Flowsheet Data    There is no flowsheet data to display.     Interpretation of Total Score  Total Score Depression Severity:  1-4 = Minimal depression, 5-9 = Mild depression, 10-14 = Moderate depression, 15-19 = Moderately severe depression, 20-27 = Severe depression   Psychosocial Evaluation and Intervention: Psychosocial Evaluation - 04/17/19 1416      Psychosocial Evaluation & Interventions   Comments  Feels behind of doing things around the house and 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 He is hoping being here will help him get stronger and increase his stamina    Expected Outcomes  Short: attend [facility]  on consistent basis to achieve goals. Long: develop self care habits    Continue Psychosocial Services   Follow up required by staff       Psychosocial Re-Evaluation:   Psychosocial Discharge (Final Psychosocial Re-Evaluation):   Vocational Rehabilitation: Provide vocational rehab assistance to qualifying candidates.   Vocational Rehab Evaluation & Intervention: Vocational Rehab - 04/17/19 1406      Initial Vocational Rehab  Evaluation & Intervention   Assessment shows need for Vocational Rehabilitation  No       Education: Education Goals: Education classes will be provided on a variety of topics geared toward better understanding of heart health and risk factor modification. Participant will state understanding/return demonstration of topics presented as noted by education test scores.  Learning Barriers/Preferences: Learning Barriers/Preferences - 04/17/19 1406      Learning Barriers/Preferences   Learning Barriers  None    Learning Preferences  Individual Instruction       Education Topics:  AED/CPR: - Group verbal and written instruction with the use of models to demonstrate the basic use of the AED with the basic ABC's of resuscitation.   General Nutrition Guidelines/Fats and Fiber: -Group instruction provided by verbal, written material, models and posters to present the general guidelines for heart healthy nutrition. Gives an explanation and review of dietary fats and fiber.   Controlling Sodium/Reading Food Labels: -Group verbal and written material supporting the discussion of sodium use in heart healthy nutrition. Review and explanation with models, verbal and written materials for utilization of the food label.   Exercise Physiology & General Exercise Guidelines: - Group verbal and written instruction with models to review the exercise physiology of the cardiovascular system and associated critical values. Provides general exercise guidelines with specific  guidelines to those with heart or lung disease.    Aerobic Exercise & Resistance Training: - Gives group verbal and written instruction on the various components of exercise. Focuses on aerobic and resistive training programs and the benefits of this training and how to safely progress through these programs..   Flexibility, Balance, Mind/Body Relaxation: Provides group verbal/written instruction on the benefits of flexibility and balance training, including mind/body exercise modes such as yoga, pilates and tai chi.  Demonstration and skill practice provided.   Stress and Anxiety: - Provides group verbal and written instruction about the health risks of elevated stress and causes of high stress.  Discuss the correlation between heart/lung disease and anxiety and treatment options. Review healthy ways to manage with stress and anxiety.   Depression: - Provides group verbal and written instruction on the correlation between heart/lung disease and depressed mood, treatment options, and the stigmas associated with seeking treatment.   Anatomy & Physiology of the Heart: - Group verbal and written instruction and models provide basic cardiac anatomy and physiology, with the coronary electrical and arterial systems. Review of Valvular disease and Heart Failure   Cardiac Procedures: - Group verbal and written instruction to review commonly prescribed medications for heart disease. Reviews the medication, class of the drug, and side effects. Includes the steps to properly store meds and maintain the prescription regimen. (beta blockers and nitrates)   Cardiac Medications I: - Group verbal and written instruction to review commonly prescribed medications for heart disease. Reviews the medication, class of the drug, and side effects. Includes the steps to properly store meds and maintain the prescription regimen.   Cardiac Medications II: -Group verbal and written instruction to review commonly  prescribed medications for heart disease. Reviews the medication, class of the drug, and side effects. (all other drug classes)    Go Sex-Intimacy & Heart Disease, Get SMART - Goal Setting: - Group verbal and written instruction through game format to discuss heart disease and the return to sexual intimacy. Provides group verbal and written material to discuss and apply goal setting through the application of the S.M.A.R.T. Method.   Other Matters of the Heart: -  Provides group verbal, written materials and models to describe Stable Angina and Peripheral Artery. Includes description of the disease process and treatment options available to the cardiac patient.   Exercise & Equipment Safety: - Individual verbal instruction and demonstration of equipment use and safety with use of the equipment.   Infection Prevention: - Provides verbal and written material to individual with discussion of infection control including proper hand washing and proper equipment cleaning during exercise session.   Falls Prevention: - Provides verbal and written material to individual with discussion of falls prevention and safety.   Diabetes: - Individual verbal and written instruction to review signs/symptoms of diabetes, desired ranges of glucose level fasting, after meals and with exercise. Acknowledge that pre and post exercise glucose checks will be done for 3 sessions at entry of program.   Know Your Numbers and Risk Factors: -Group verbal and written instruction about important numbers in your health.  Discussion of what are risk factors and how they play a role in the disease process.  Review of Cholesterol, Blood Pressure, Diabetes, and BMI and the role they play in your overall health.   Sleep Hygiene: -Provides group verbal and written instruction about how sleep can affect your health.  Define sleep hygiene, discuss sleep cycles and impact of sleep habits. Review good sleep hygiene tips.     Other: -Provides group and verbal instruction on various topics (see comments)   Knowledge Questionnaire Score:   Core Components/Risk Factors/Patient Goals at Admission: Personal Goals and Risk Factors at Admission - 04/17/19 1405      Core Components/Risk Factors/Patient Goals on Admission   Tobacco Cessation  Yes    Number of packs per day  1 pack/ day    Intervention  Assist the participant in steps to quit. Provide individualized education and counseling about committing to Tobacco Cessation, relapse prevention, and pharmacological support that can be provided by physician.;Advice worker, assist with locating and accessing local/national Quit Smoking programs, and support quit date choice.    Expected Outcomes  Short Term: Will demonstrate readiness to quit, by selecting a quit date.;Short Term: Will quit all tobacco product use, adhering to prevention of relapse plan.;Long Term: Complete abstinence from all tobacco products for at least 12 months from quit date.    Lipids  Yes    Intervention  Provide education and support for participant on nutrition & aerobic/resistive exercise along with prescribed medications to achieve LDL '70mg'$ , HDL >'40mg'$ .    Expected Outcomes  Short Term: Participant states understanding of desired cholesterol values and is compliant with medications prescribed. Participant is following exercise prescription and nutrition guidelines.;Long Term: Cholesterol controlled with medications as prescribed, with individualized exercise RX and with personalized nutrition plan. Value goals: LDL < '70mg'$ , HDL > 40 mg.       Core Components/Risk Factors/Patient Goals Review:    Core Components/Risk Factors/Patient Goals at Discharge (Final Review):    ITP Comments: ITP Comments    Row Name 04/17/19 1401 04/18/19 1211 04/18/19 1311 04/26/19 1112     ITP Comments  Virtual Initial Oriention completed. Diagnosis [facility] in [facility] 8/[facility]. EP/RD  orientation scheduled fro 9/29 at 11 am  completed RD initial eval  When patient arrived today patient reported to Nada Maclachlan, EP, that he had not taken his Plavix in 3 days due to being out of the medication.  The reason reported was he had not yet received his medications in the mail from the [place].  Patient reported this  is the only medication he has missed.  Patient no longer taking baby aspirin as he was advised to stop Aspirin after one month.  He is also on Coumadin due to afib / flutter.  Sent secure chat message to Ronda Fairly, RN and Ignacia Bayley, NP at [facility] to request Plavix prescription be called in to [facility] in [place].  Patient has [employer] in addition to the [place]. Verlon Au, RN responded on [employer] letting me know she had called the prescription in.  Patient instructed to go pick up Plavix prescription and take medication today.  Note:  Patient did not exercise today.     Patient will return on Monday, April 24, 2019 to start Cardiac Rehab.  30 day review completed. ITP sent to [name], Medical Director of Cardiac and Pulmonary Rehab. Continue with ITP unless changes are made by physician.  Department closed starting 10/2 until further notice by infection prevention and Health at Work teams for [place].       Comments: 30 day review

## 2019-05-01 ENCOUNTER — Other Ambulatory Visit: Payer: Self-pay

## 2019-05-01 ENCOUNTER — Encounter: Payer: No Typology Code available for payment source | Admitting: *Deleted

## 2019-05-01 VITALS — Ht 73.5 in | Wt 183.2 lb

## 2019-05-01 DIAGNOSIS — I213 ST elevation (STEMI) myocardial infarction of unspecified site: Secondary | ICD-10-CM

## 2019-05-01 NOTE — Progress Notes (Signed)
Cardiac Individual Treatment Plan  Patient Details  Name: Brian Mcclure MRN: [id number] Date of Birth: 17-Mar-1956 Referring Provider:     Cardiac Rehab from 05/01/2019 in [facility] Cardiac and Pulmonary Rehab  Referring Provider  Lynda Rainwater, MD      Initial Encounter Date:    Cardiac Rehab from 05/01/2019 in [facility] Cardiac and Pulmonary Rehab  Date  05/01/19      Visit Diagnosis: ST elevation myocardial infarction (STEMI), unspecified artery ([place])  Patient's Home Medications on Admission:  Current Outpatient Medications:  .  Ca Carbonate-Mag Hydroxide (ROLAIDS PO), Take 1 tablet by mouth at bedtime as needed (heartburn/acid reflux)., Disp: , Rfl:  .  cholecalciferol (VITAMIN D) 25 MCG (1000 UT) tablet, Take 1,000 Units by mouth 2 (two) times daily., Disp: , Rfl:  .  clopidogrel (PLAVIX) 75 MG tablet, Take 1 tablet (75 mg total) by mouth daily., Disp: 30 tablet, Rfl: 11 .  clopidogrel (PLAVIX) 75 MG tablet, Take 1 tablet (75 mg total) by mouth daily., Disp: 30 tablet, Rfl: 0 .  diclofenac sodium (VOLTAREN) 1 % GEL, Apply 1 application topically daily as needed (pain)., Disp: , Rfl:  .  famotidine (PEPCID) 20 MG tablet, , Disp: , Rfl:  .  losartan (COZAAR) 25 MG tablet, Take 0.5 tablets (12.5 mg total) by mouth daily., Disp: 45 tablet, Rfl: 2 .  nitroGLYCERIN (NITROSTAT) 0.4 MG SL tablet, Place 1 tablet (0.4 mg total) under the tongue every 5 (five) minutes x 3 doses as needed for chest pain., Disp: 25 tablet, Rfl: 12 .  oxymetazoline (AFRIN) 0.05 % nasal spray, Place 1 spray into both nostrils 2 (two) times daily as needed for congestion., Disp: , Rfl:  .  rosuvastatin (CRESTOR) 40 MG tablet, Take 1 tablet (40 mg total) by mouth at bedtime., Disp: 30 tablet, Rfl: 6 .  Simethicone (GAS-X PO), Take 1 tablet by mouth 3 (three) times daily as needed (heartburn)., Disp: , Rfl:  .  warfarin (COUMADIN) 5 MG tablet, Take 7.5 mg ( 1 1/2 tables) every night with dinner until INR check on  Friday), Disp: 30 tablet, Rfl: 0  Past Medical History: Past Medical History:  Diagnosis Date  . Actinic keratosis   . Acute ST elevation myocardial infarction (STEMI) of anterior wall ([place])    a. 02/2019 s/p PCI/DES ->LAD.  Marland Kitchen Apical mural thrombus following MI ([place])    a. 02/2019 s/p Ant MI-->Echo: Small, fixed thrombus on the apical wall of LV-->coumadin.  . Barrett's esophagus   . CAD (coronary artery disease)    a. 02/2019 Ant STEMI/PCI: LM nl, LAD 100p (4x18 Resolute Onyx DES), LCX/RCA min irregs, EF 35-45%.  . Chronic back pain   . Colon polyps    a. 07/2017 Colonoscopy: 61m sigmoid polyp, tubular adenoma, diverticulosis, int hemorrhoids.  .Marland KitchenCOPD (chronic obstructive pulmonary disease) ([place]   . Degeneration of lumbar intervertebral disc   . Dysphonia   . GERD (gastroesophageal reflux disease)   . HFrEF (heart failure with reduced ejection fraction) ([place]    a. 02/2019 Echo: EF 40-45%, apical, septal, inf, and inforlateral AK. Mid and apical anterior/mid anteroseptal AK. Mildly dil LA.  .Marland KitchenHydrocele   . Hyperlipidemia   . Ischemic cardiomyopathy    a. 02/2019 Echo: EF 40-45%.  . Myalgia   . Osteoarthritis    C-spine  . PAF (paroxysmal atrial fibrillation) ([facility])    a. CHA2DS2VASc = 2.  . Peripheral vascular disease ([place]    a. 2018 s/p aorto-iliac stent graft.  .Marland Kitchen  Reactive airway disease   . Seborrheic keratosis   . Sinus bradycardia    a. 02/2019 Jxnl rhythm and sinus brady in setting of Ant MI-->[facility] d/c'd.  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  . Tobacco use disorder   . Tremor     Tobacco Use: Social History   Tobacco Use  Smoking Status Current Every Day Smoker  . Packs/day: 1.00  . Years: 40.00  . Pack years: 40.00  . Types: Cigarettes  Smokeless Tobacco Never Used    Labs: Recent Review Flowsheet Data    Labs for ITP Cardiac and Pulmonary Rehab Latest Ref Rng & Units 03/12/2019   Cholestrol 0 - 200 mg/dL 104   LDLCALC 0 - 99 mg/dL  59   HDL >40 mg/dL 31(L)   Trlycerides <150 mg/dL 70   Hemoglobin A1c 4.8 - 5.6 % 6.0(H)   TCO2 22 - 32 mmol/L 18(L)       Exercise Target Goals: Exercise Program Goal: Individual exercise prescription set using results from initial 6 min walk test and THRR while considering  patient's activity barriers and safety.   Exercise Prescription Goal: Initial exercise prescription builds to 30-45 minutes a day of aerobic activity, 2-3 days per week.  Home exercise guidelines will be given to patient during program as part of exercise prescription that the participant will acknowledge.  Activity Barriers & Risk Stratification: Activity Barriers & Cardiac Risk Stratification - 05/01/19 1017      Activity Barriers & Cardiac Risk Stratification   Activity Barriers  Joint Problems;Back Problems;Muscular Weakness;Deconditioning;Other (comment)    Comments  claudication pain    Cardiac Risk Stratification  High       6 Minute Walk: 6 Minute Walk    Row Name 05/01/19 1017         6 Minute Walk   Phase  Initial     Distance  1765 feet     Walk Time  6 minutes     # of Rest Breaks  0     MPH  3.34     METS  4.37     RPE  10     Perceived Dyspnea   1     VO2 Peak  15.3     Symptoms  Yes (comment)     Comments  claudication pain 3/5, SOB     Resting HR  52 bpm     Resting BP  118/64     Resting Oxygen Saturation   99 %     Exercise Oxygen Saturation  during 6 min walk  100 %     Max Ex. HR  92 bpm     Max Ex. BP  132/74     2 Minute Post BP  124/62        Oxygen Initial Assessment:   Oxygen Re-Evaluation:   Oxygen Discharge (Final Oxygen Re-Evaluation):   Initial Exercise Prescription: Initial Exercise Prescription - 05/01/19 1000      Date of Initial Exercise RX and Referring Provider   Date  05/01/19    Referring Provider  Lynda Rainwater, MD      Treadmill   MPH  3    Grade  1    Minutes  15    METs  3.71      Recumbant Bike   Level  3    RPM  50    Watts   63    Minutes  15  METs  3      NuStep   Level  4    SPM  80    Minutes  15    METs  3      Prescription Details   Frequency (times per week)  2    Duration  Progress to 30 minutes of continuous aerobic without signs/symptoms of physical distress      Intensity   THRR 40-80% of Max Heartrate  94-136    Ratings of Perceived Exertion  11-13    Perceived Dyspnea  0-4      Progression   Progression  Continue to progress workloads to maintain intensity without signs/symptoms of physical distress.      Resistance Training   Training Prescription  Yes    Weight  4 lbs    Reps  10-15       Perform Capillary Blood Glucose checks as needed.  Exercise Prescription Changes: Exercise Prescription Changes    Row Name 05/01/19 1000             Response to Exercise   Blood Pressure (Admit)  118/64       Blood Pressure (Exercise)  132/74       Blood Pressure (Exit)  124/62       Heart Rate (Admit)  52 bpm       Heart Rate (Exercise)  92 bpm       Oxygen Saturation (Admit)  99 %       Oxygen Saturation (Exercise)  100 %       Rating of Perceived Exertion (Exercise)  10       Perceived Dyspnea (Exercise)  1       Symptoms  claudication pain 3/5, SOB       Comments  walk test results          Exercise Comments: Exercise Comments    Row Name 05/01/19 0948           Exercise Comments  First full day of exercise!  Patient was oriented to gym and equipment including functions, settings, policies, and procedures.  Patient's individual exercise prescription and treatment plan were reviewed.  All starting workloads were established based on the results of the 6 minute walk test done at initial orientation visit.  The plan for exercise progression was also introduced and progression will be customized based on patient's performance and goals.          Exercise Goals and Review: Exercise Goals    Row Name 05/01/19 1021             Exercise Goals   Increase Physical  Activity  Yes       Intervention  Provide advice, education, support and counseling about physical activity/exercise needs.;Develop an individualized exercise prescription for aerobic and resistive training based on initial evaluation findings, risk stratification, comorbidities and participant's personal goals.       Expected Outcomes  Short Term: Attend rehab on a regular basis to increase amount of physical activity.;Long Term: Add in home exercise to make exercise part of routine and to increase amount of physical activity.;Long Term: Exercising regularly at least 3-5 days a week.       Increase Strength and Stamina  Yes       Intervention  Provide advice, education, support and counseling about physical activity/exercise needs.;Develop an individualized exercise prescription for aerobic and resistive training based on initial evaluation findings, risk stratification, comorbidities and participant's personal goals.  Expected Outcomes  Short Term: Increase workloads from initial exercise prescription for resistance, speed, and METs.;Short Term: Perform resistance training exercises routinely during rehab and add in resistance training at home;Long Term: Improve cardiorespiratory fitness, muscular endurance and strength as measured by increased METs and functional capacity (6MWT)       Able to understand and use rate of perceived exertion (RPE) scale  Yes       Intervention  Provide education and explanation on how to use RPE scale       Expected Outcomes  Short Term: Able to use RPE daily in rehab to express subjective intensity level;Long Term:  Able to use RPE to guide intensity level when exercising independently       Able to understand and use Dyspnea scale  Yes       Intervention  Provide education and explanation on how to use Dyspnea scale       Expected Outcomes  Short Term: Able to use Dyspnea scale daily in rehab to express subjective sense of shortness of breath during exertion;Long  Term: Able to use Dyspnea scale to guide intensity level when exercising independently       Knowledge and understanding of Target Heart Rate Range (THRR)  Yes       Intervention  Provide education and explanation of THRR including how the numbers were predicted and where they are located for reference       Expected Outcomes  Short Term: Able to state/look up THRR;Short Term: Able to use daily as guideline for intensity in rehab;Long Term: Able to use THRR to govern intensity when exercising independently       Able to check pulse independently  Yes       Intervention  Provide education and demonstration on how to check pulse in carotid and radial arteries.;Review the importance of being able to check your own pulse for safety during independent exercise       Expected Outcomes  Short Term: Able to explain why pulse checking is important during independent exercise;Long Term: Able to check pulse independently and accurately       Understanding of Exercise Prescription  Yes       Intervention  Provide education, explanation, and written materials on patient's individual exercise prescription       Expected Outcomes  Short Term: Able to explain program exercise prescription;Long Term: Able to explain home exercise prescription to exercise independently       Improve claudication pain toleration; Improve walking ability  Yes       Intervention  Participate in PAD/SET Rehab 2-3 days a week, walking at home as part of exercise prescription;Attend education sessions to aid in risk factor modification and understanding of disease process       Expected Outcomes  Short Term: Improve walking distance/time to onset of claudication pain;Long Term: Improve walking ability and toleration to claudication          Exercise Goals Re-Evaluation : Exercise Goals Re-Evaluation    Row Name 05/01/19 1026             Exercise Goal Re-Evaluation   Exercise Goals Review  Increase Physical Activity;Increase  Strength and Stamina;Understanding of Exercise Prescription       Comments  Reviewed RPE scale, THR and program prescription with pt today.  Pt voiced understanding and was given a copy of goals to take home.       Expected Outcomes  Short: Use RPE daily to regulate intensity. Long: Follow  program prescription in THR.          Discharge Exercise Prescription (Final Exercise Prescription Changes): Exercise Prescription Changes - 05/01/19 1000      Response to Exercise   Blood Pressure (Admit)  118/64    Blood Pressure (Exercise)  132/74    Blood Pressure (Exit)  124/62    Heart Rate (Admit)  52 bpm    Heart Rate (Exercise)  92 bpm    Oxygen Saturation (Admit)  99 %    Oxygen Saturation (Exercise)  100 %    Rating of Perceived Exertion (Exercise)  10    Perceived Dyspnea (Exercise)  1    Symptoms  claudication pain 3/5, SOB    Comments  walk test results       Nutrition:  Target Goals: Understanding of nutrition guidelines, daily intake of sodium '1500mg'$ , cholesterol '200mg'$ , calories 30% from fat and 7% or less from saturated fats, daily to have 5 or more servings of fruits and vegetables.  Biometrics: Pre Biometrics - 05/01/19 1021      Pre Biometrics   Height  6' 1.5" (1.867 m)    Weight  183 lb 3.2 oz (83.1 kg)    BMI (Calculated)  23.84    Single Leg Stand  30 seconds        Nutrition Therapy Plan and Nutrition Goals: Nutrition Therapy & Goals - 04/18/19 1209      Nutrition Therapy   Diet  low Na, HH diet    Drug/Food Interactions  Coumadin/Vit K    Protein (specify units)  65g    Fiber  30 grams    Whole Grain Foods  3 servings    Saturated Fats  12 max. grams    Fruits and Vegetables  5 servings/day    Sodium  1.5 grams      Personal Nutrition Goals   Nutrition Goal  ST: eat salads 2x/week LT: feel better    Comments  Pt reports working all day doing chores. Pt reports that he sometimes does eat breakfast, but if he does he will have a bagel and cream cheese  or ham biscuit. L and D is usually chicken and a starch, pt reports liking vegetables. Dessert is usually some chocolate and ~10 vanilla wafers with peanut butter. Discussed HH eating. Pt reports he thinks his diet is ok but he should eat more salad. Discussed consistency and vitaminK and medication.      Intervention Plan   Intervention  Prescribe, educate and counsel regarding individualized specific dietary modifications aiming towards targeted core components such as weight, hypertension, lipid management, diabetes, heart failure and other comorbidities.;Nutrition handout(s) given to patient.    Expected Outcomes  Short Term Goal: Understand basic principles of dietary content, such as calories, fat, sodium, cholesterol and nutrients.;Short Term Goal: A plan has been developed with personal nutrition goals set during dietitian appointment.;Long Term Goal: Adherence to prescribed nutrition plan.       Nutrition Assessments: Nutrition Assessments - 05/01/19 1023      MEDFICTS Scores   Pre Score  38       Nutrition Goals Re-Evaluation:   Nutrition Goals Discharge (Final Nutrition Goals Re-Evaluation):   Psychosocial: Target Goals: Acknowledge presence or absence of significant depression and/or stress, maximize coping skills, provide positive support system. Participant is able to verbalize types and ability to use techniques and skills needed for reducing stress and depression.   Initial Review & Psychosocial Screening: Initial Psych Review & Screening - 04/17/19  1408      Initial Review   Current issues with  Current Stress Concerns    Comments  Feels behind of doing things around the house and 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 He is hoping being here will help him get stronger and increase his stamina      Family Dynamics   Good Support System?  Yes      Barriers   Psychosocial barriers to participate in program  There are no  identifiable barriers or psychosocial needs.;The patient should benefit from training in stress management and relaxation.      Screening Interventions   Interventions  Encouraged to exercise;Program counselor consult;To provide support and resources with identified psychosocial needs    Expected Outcomes  Short Term goal: Utilizing psychosocial counselor, staff and physician to assist with identification of specific Stressors or current issues interfering with healing process. Setting desired goal for each stressor or current issue identified.;Long Term Goal: Stressors or current issues are controlled or eliminated.;Short Term goal: Identification and review with participant of any Quality of Life or Depression concerns found by scoring the questionnaire.;Long Term goal: The participant improves quality of Life and PHQ9 Scores as seen by post scores and/or verbalization of changes       Quality of Life Scores:  Quality of Life - 05/01/19 1022      Quality of Life   Select  Quality of Life      Quality of Life Scores   Health/Function Pre  23.53 %    Socioeconomic Pre  24.42 %    Psych/Spiritual Pre  27 %    Family Pre  27.1 %    GLOBAL Pre  24.97 %      Scores of 19 and below usually indicate a poorer quality of life in these areas.  A difference of  2-3 points is a clinically meaningful difference.  A difference of 2-3 points in the total score of the Quality of Life Index has been associated with significant improvement in overall quality of life, self-image, physical symptoms, and general health in studies assessing change in quality of life.  PHQ-9: Recent Review Flowsheet Data    Depression screen [facility] 2/9 05/01/2019   Decreased Interest 0   Down, Depressed, Hopeless 0   PHQ - 2 Score 0   Altered sleeping 0   Tired, decreased energy 1   Change in appetite 1   Feeling bad or failure about yourself  0   Trouble concentrating 0   Moving slowly or fidgety/restless 0   Suicidal  thoughts 0   PHQ-9 Score 2   Difficult doing work/chores Somewhat difficult     Interpretation of Total Score  Total Score Depression Severity:  1-4 = Minimal depression, 5-9 = Mild depression, 10-14 = Moderate depression, 15-19 = Moderately severe depression, 20-27 = Severe depression   Psychosocial Evaluation and Intervention: Psychosocial Evaluation - 04/17/19 1416      Psychosocial Evaluation & Interventions   Comments  Feels behind of doing things around the house and 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 He is hoping being here will help him get stronger and increase his stamina    Expected Outcomes  Short: attend [facility] on consistent basis to achieve goals. Long: develop self care habits    Continue Psychosocial Services   Follow up required by staff  Psychosocial Re-Evaluation:   Psychosocial Discharge (Final Psychosocial Re-Evaluation):   Vocational Rehabilitation: Provide vocational rehab assistance to qualifying candidates.   Vocational Rehab Evaluation & Intervention: Vocational Rehab - 04/17/19 1406      Initial Vocational Rehab Evaluation & Intervention   Assessment shows need for Vocational Rehabilitation  No       Education: Education Goals: Education classes will be provided on a variety of topics geared toward better understanding of heart health and risk factor modification. Participant will state understanding/return demonstration of topics presented as noted by education test scores.  Learning Barriers/Preferences: Learning Barriers/Preferences - 04/17/19 1406      Learning Barriers/Preferences   Learning Barriers  None    Learning Preferences  Individual Instruction       Education Topics:  AED/CPR: - Group verbal and written instruction with the use of models to demonstrate the basic use of the AED with the basic ABC's of resuscitation.   General Nutrition Guidelines/Fats and  Fiber: -Group instruction provided by verbal, written material, models and posters to present the general guidelines for heart healthy nutrition. Gives an explanation and review of dietary fats and fiber.   Controlling Sodium/Reading Food Labels: -Group verbal and written material supporting the discussion of sodium use in heart healthy nutrition. Review and explanation with models, verbal and written materials for utilization of the food label.   Exercise Physiology & General Exercise Guidelines: - Group verbal and written instruction with models to review the exercise physiology of the cardiovascular system and associated critical values. Provides general exercise guidelines with specific guidelines to those with heart or lung disease.    Aerobic Exercise & Resistance Training: - Gives group verbal and written instruction on the various components of exercise. Focuses on aerobic and resistive training programs and the benefits of this training and how to safely progress through these programs..   Flexibility, Balance, Mind/Body Relaxation: Provides group verbal/written instruction on the benefits of flexibility and balance training, including mind/body exercise modes such as yoga, pilates and tai chi.  Demonstration and skill practice provided.   Stress and Anxiety: - Provides group verbal and written instruction about the health risks of elevated stress and causes of high stress.  Discuss the correlation between heart/lung disease and anxiety and treatment options. Review healthy ways to manage with stress and anxiety.   Depression: - Provides group verbal and written instruction on the correlation between heart/lung disease and depressed mood, treatment options, and the stigmas associated with seeking treatment.   Anatomy & Physiology of the Heart: - Group verbal and written instruction and models provide basic cardiac anatomy and physiology, with the coronary electrical and arterial  systems. Review of Valvular disease and Heart Failure   Cardiac Procedures: - Group verbal and written instruction to review commonly prescribed medications for heart disease. Reviews the medication, class of the drug, and side effects. Includes the steps to properly store meds and maintain the prescription regimen. (beta blockers and nitrates)   Cardiac Medications I: - Group verbal and written instruction to review commonly prescribed medications for heart disease. Reviews the medication, class of the drug, and side effects. Includes the steps to properly store meds and maintain the prescription regimen.   Cardiac Medications II: -Group verbal and written instruction to review commonly prescribed medications for heart disease. Reviews the medication, class of the drug, and side effects. (all other drug classes)    Go Sex-Intimacy & Heart Disease, Get SMART - Goal Setting: - Group verbal and written instruction  through game format to discuss heart disease and the return to sexual intimacy. Provides group verbal and written material to discuss and apply goal setting through the application of the S.M.A.R.T. Method.   Other Matters of the Heart: - Provides group verbal, written materials and models to describe Stable Angina and Peripheral Artery. Includes description of the disease process and treatment options available to the cardiac patient.   Exercise & Equipment Safety: - Individual verbal instruction and demonstration of equipment use and safety with use of the equipment.   Infection Prevention: - Provides verbal and written material to individual with discussion of infection control including proper hand washing and proper equipment cleaning during exercise session.   Falls Prevention: - Provides verbal and written material to individual with discussion of falls prevention and safety.   Diabetes: - Individual verbal and written instruction to review signs/symptoms of  diabetes, desired ranges of glucose level fasting, after meals and with exercise. Acknowledge that pre and post exercise glucose checks will be done for 3 sessions at entry of program.   Know Your Numbers and Risk Factors: -Group verbal and written instruction about important numbers in your health.  Discussion of what are risk factors and how they play a role in the disease process.  Review of Cholesterol, Blood Pressure, Diabetes, and BMI and the role they play in your overall health.   Sleep Hygiene: -Provides group verbal and written instruction about how sleep can affect your health.  Define sleep hygiene, discuss sleep cycles and impact of sleep habits. Review good sleep hygiene tips.    Other: -Provides group and verbal instruction on various topics (see comments)   Knowledge Questionnaire Score: Knowledge Questionnaire Score - 05/01/19 1023      Knowledge Questionnaire Score   Pre Score  21/26 education focus: A&P, PAD, Nutrition, Exercise       Core Components/Risk Factors/Patient Goals at Admission: Personal Goals and Risk Factors at Admission - 05/01/19 1024      Core Components/Risk Factors/Patient Goals on Admission    Weight Management  Yes;Weight Maintenance    Intervention  Weight Management: Develop a combined nutrition and exercise program designed to reach desired caloric intake, while maintaining appropriate intake of nutrient and fiber, sodium and fats, and appropriate energy expenditure required for the weight goal.;Weight Management: Provide education and appropriate resources to help participant work on and attain dietary goals.    Admit Weight  183 lb 3.2 oz (83.1 kg)    Goal Weight: Short Term  183 lb (83 kg)    Goal Weight: Long Term  183 lb (83 kg)    Expected Outcomes  Short Term: Continue to assess and modify interventions until short term weight is achieved;Long Term: Adherence to nutrition and physical activity/exercise program aimed toward attainment  of established weight goal;Weight Maintenance: Understanding of the daily nutrition guidelines, which includes 25-35% calories from fat, 7% or less cal from saturated fats, less than '200mg'$  cholesterol, less than 1.5gm of sodium, & 5 or more servings of fruits and vegetables daily    Number of packs per day  1 pack/ day    Intervention  Assist the participant in steps to quit. Provide individualized education and counseling about committing to Tobacco Cessation, relapse prevention, and pharmacological support that can be provided by physician.;Advice worker, assist with locating and accessing local/national Quit Smoking programs, and support quit date choice.    Expected Outcomes  Short Term: Will demonstrate readiness to quit, by selecting a quit date.;Short  Term: Will quit all tobacco product use, adhering to prevention of relapse plan.;Long Term: Complete abstinence from all tobacco products for at least 12 months from quit date.    Lipids  Yes    Intervention  Provide education and support for participant on nutrition & aerobic/resistive exercise along with prescribed medications to achieve LDL '70mg'$ , HDL >'40mg'$ .    Expected Outcomes  Short Term: Participant states understanding of desired cholesterol values and is compliant with medications prescribed. Participant is following exercise prescription and nutrition guidelines.;Long Term: Cholesterol controlled with medications as prescribed, with individualized exercise RX and with personalized nutrition plan. Value goals: LDL < '70mg'$ , HDL > 40 mg.       Core Components/Risk Factors/Patient Goals Review:    Core Components/Risk Factors/Patient Goals at Discharge (Final Review):    ITP Comments: ITP Comments    Row Name 04/17/19 1401 04/18/19 1211 04/18/19 1311 04/26/19 1112 05/01/19 0947   ITP Comments  Virtual Initial Oriention completed. Diagnosis can be found in [facility] 8/23. EP/RD orientation scheduled fro 9/29 at 11 am  completed  RD initial eval  When patient arrived today patient reported to Nada Maclachlan, EP, that he had not taken his Plavix in 3 days due to being out of the medication.  The reason reported was he had not yet received his medications in the mail from the [place].  Patient reported this is the only medication he has missed.  Patient no longer taking baby aspirin as he was advised to stop Aspirin after one month.  He is also on Coumadin due to afib / flutter.  Sent secure chat message to Ronda Fairly, RN and Ignacia Bayley, NP at [facility] to request Plavix prescription be called in to [facility] in [place].  Patient has [employer] in addition to the [place]. Verlon Au, RN responded on [employer] letting me know she had called the prescription in.  Patient instructed to go pick up Plavix prescription and take medication today.  Note:  Patient did not exercise today.     Patient will return on Monday, April 24, 2019 to start Cardiac Rehab.  30 day review completed. ITP sent to [name], Medical Director of Cardiac and Pulmonary Rehab. Continue with ITP unless changes are made by physician.  Department closed starting 10/2 until further notice by infection prevention and Health at Work teams for [place].  First full day of exercise!  Patient was oriented to gym and equipment including functions, settings, policies, and procedures.  Patient's individual exercise prescription and treatment plan were reviewed.  All starting workloads were established based on the results of the 6 minute walk test done at initial orientation visit.  The plan for exercise progression was also introduced and progression will be customized based on patient's performance and goals.      Comments: Initial ITp

## 2019-05-01 NOTE — Progress Notes (Signed)
Incomplete Session Note  Patient Details  Name: Brian Mcclure MRN: [id number] Date of Birth: [birth year] Referring Provider:    [name] did not complete his rehab session.  When patient arrived today patient reported to Nada Maclachlan, EP, that he had not taken his Plavix in 3 days due to being out of the medication. The reason reported was he had not yet received his medications in the mail from the [place]. Patient reported this is the only medication he has missed. Patient no longer taking baby aspirin as he was advised to stop Aspirin after one month. He is also on Coumadin due to afib / flutter. Sent secure chat message to Ronda Fairly, RN and Ignacia Bayley, NP at [facility] to request Plavix prescription be called in to [facility] in [place]. Patient has [employer] in addition to the [place]. Verlon Au, RN responded on [employer] letting me know she had called the prescription in. Patient instructed to go pick up Plavix prescription and take medication today. Note: Patient did not exercise today. Patient will return on Monday, April 24, 2019 to start Cardiac Rehab.

## 2019-05-01 NOTE — Patient Instructions (Signed)
Patient Instructions  Patient Details  Name: Brian Mcclure MRN: [id number] Date of Birth: August 12, 1955 Referring Provider:  [place] are your personal goals for exercise, nutrition, and risk factors. Our goal is to help you stay on track towards obtaining and maintaining these goals. We will be discussing your progress on these goals with you throughout the program.  Initial Exercise Prescription: Initial Exercise Prescription - 05/01/19 1000      Date of Initial Exercise RX and Referring Provider   Date  05/01/19    Referring Provider  Lynda Rainwater, MD      Treadmill   MPH  3    Grade  1    Minutes  15    METs  3.71      Recumbant Bike   Level  3    RPM  50    Watts  63    Minutes  15    METs  3      NuStep   Level  4    SPM  80    Minutes  15    METs  3      Prescription Details   Frequency (times per week)  2    Duration  Progress to 30 minutes of continuous aerobic without signs/symptoms of physical distress      Intensity   THRR 40-80% of Max Heartrate  94-136    Ratings of Perceived Exertion  11-13    Perceived Dyspnea  0-4      Progression   Progression  Continue to progress workloads to maintain intensity without signs/symptoms of physical distress.      Resistance Training   Training Prescription  Yes    Weight  4 lbs    Reps  10-15       Exercise Goals: Frequency: Be able to perform aerobic exercise two to three times per week in program working toward 2-5 days per week of home exercise.  Intensity: Work with a perceived exertion of 11 (fairly light) - 15 (hard) while following your exercise prescription.  We will make changes to your prescription with you as you progress through the program.   Duration: Be able to do 30 to 45 minutes of continuous aerobic exercise in addition to a 5 minute warm-up and a 5 minute cool-down routine.   Nutrition Goals: Your personal nutrition goals will be established when you do your  nutrition analysis with the dietician.  The following are general nutrition guidelines to follow: Cholesterol < 200mg /day Sodium < 1500mg /day Fiber: Men over 50 yrs - 30 grams per day  Personal Goals: Personal Goals and Risk Factors at Admission - 05/01/19 1024      Core Components/Risk Factors/Patient Goals on Admission    Weight Management  Yes;Weight Maintenance    Intervention  Weight Management: Develop a combined nutrition and exercise program designed to reach desired caloric intake, while maintaining appropriate intake of nutrient and fiber, sodium and fats, and appropriate energy expenditure required for the weight goal.;Weight Management: Provide education and appropriate resources to help participant work on and attain dietary goals.    Admit Weight  183 lb 3.2 oz (83.1 kg)    Goal Weight: Short Term  183 lb (83 kg)    Goal Weight: Long Term  183 lb (83 kg)    Expected Outcomes  Short Term: Continue to assess and modify interventions until short term weight is achieved;Long Term: Adherence to nutrition and physical activity/exercise program aimed toward  attainment of established weight goal;Weight Maintenance: Understanding of the daily nutrition guidelines, which includes 25-35% calories from fat, 7% or less cal from saturated fats, less than 200mg  cholesterol, less than 1.5gm of sodium, & 5 or more servings of fruits and vegetables daily    Number of packs per day  1 pack/ day    Intervention  Assist the participant in steps to quit. Provide individualized education and counseling about committing to Tobacco Cessation, relapse prevention, and pharmacological support that can be provided by physician.;Advice worker, assist with locating and accessing local/national Quit Smoking programs, and support quit date choice.    Expected Outcomes  Short Term: Will demonstrate readiness to quit, by selecting a quit date.;Short Term: Will quit all tobacco product use, adhering to  prevention of relapse plan.;Long Term: Complete abstinence from all tobacco products for at least 12 months from quit date.    Lipids  Yes    Intervention  Provide education and support for participant on nutrition & aerobic/resistive exercise along with prescribed medications to achieve LDL 70mg , HDL >40mg .    Expected Outcomes  Short Term: Participant states understanding of desired cholesterol values and is compliant with medications prescribed. Participant is following exercise prescription and nutrition guidelines.;Long Term: Cholesterol controlled with medications as prescribed, with individualized exercise RX and with personalized nutrition plan. Value goals: LDL < 70mg , HDL > 40 mg.       Tobacco Use Initial Evaluation: Social History   Tobacco Use  Smoking Status Current Every Day Smoker  . Packs/day: 1.00  . Years: 40.00  . Pack years: 40.00  . Types: Cigarettes  Smokeless Tobacco Never Used    Exercise Goals and Review: Exercise Goals    Row Name 05/01/19 1021             Exercise Goals   Increase Physical Activity  Yes       Intervention  Provide advice, education, support and counseling about physical activity/exercise needs.;Develop an individualized exercise prescription for aerobic and resistive training based on initial evaluation findings, risk stratification, comorbidities and participant's personal goals.       Expected Outcomes  Short Term: Attend rehab on a regular basis to increase amount of physical activity.;Long Term: Add in home exercise to make exercise part of routine and to increase amount of physical activity.;Long Term: Exercising regularly at least 3-5 days a week.       Increase Strength and Stamina  Yes       Intervention  Provide advice, education, support and counseling about physical activity/exercise needs.;Develop an individualized exercise prescription for aerobic and resistive training based on initial evaluation findings, risk  stratification, comorbidities and participant's personal goals.       Expected Outcomes  Short Term: Increase workloads from initial exercise prescription for resistance, speed, and METs.;Short Term: Perform resistance training exercises routinely during rehab and add in resistance training at home;Long Term: Improve cardiorespiratory fitness, muscular endurance and strength as measured by increased METs and functional capacity (6MWT)       Able to understand and use rate of perceived exertion (RPE) scale  Yes       Intervention  Provide education and explanation on how to use RPE scale       Expected Outcomes  Short Term: Able to use RPE daily in rehab to express subjective intensity level;Long Term:  Able to use RPE to guide intensity level when exercising independently       Able to understand and use Dyspnea  scale  Yes       Intervention  Provide education and explanation on how to use Dyspnea scale       Expected Outcomes  Short Term: Able to use Dyspnea scale daily in rehab to express subjective sense of shortness of breath during exertion;Long Term: Able to use Dyspnea scale to guide intensity level when exercising independently       Knowledge and understanding of Target Heart Rate Range (THRR)  Yes       Intervention  Provide education and explanation of THRR including how the numbers were predicted and where they are located for reference       Expected Outcomes  Short Term: Able to state/look up THRR;Short Term: Able to use daily as guideline for intensity in rehab;Long Term: Able to use THRR to govern intensity when exercising independently       Able to check pulse independently  Yes       Intervention  Provide education and demonstration on how to check pulse in carotid and radial arteries.;Review the importance of being able to check your own pulse for safety during independent exercise       Expected Outcomes  Short Term: Able to explain why pulse checking is important during independent  exercise;Long Term: Able to check pulse independently and accurately       Understanding of Exercise Prescription  Yes       Intervention  Provide education, explanation, and written materials on patient's individual exercise prescription       Expected Outcomes  Short Term: Able to explain program exercise prescription;Long Term: Able to explain home exercise prescription to exercise independently       Improve claudication pain toleration; Improve walking ability  Yes       Intervention  Participate in PAD/SET Rehab 2-3 days a week, walking at home as part of exercise prescription;Attend education sessions to aid in risk factor modification and understanding of disease process       Expected Outcomes  Short Term: Improve walking distance/time to onset of claudication pain;Long Term: Improve walking ability and toleration to claudication          Copy of goals given to participant.

## 2019-05-01 NOTE — Progress Notes (Signed)
Daily Session Note  Patient Details  Name: Brian Mcclure MRN: [id number] Date of Birth: 12/13/1955 Referring Provider:     Cardiac Rehab from 05/01/2019 in [facility]  Referring Provider  Lynda Rainwater, MD      Encounter Date: 05/01/2019  Check In: Session Check In - 05/01/19 0945      Check-In   Supervising physician immediately available to respond to emergencies  See telemetry face sheet for immediately available ER MD    Location  [facility]-Cardiac & Pulmonary Rehab    Staff Present  Heath Lark, RN, BSN, CCRP;Jeanna Durrell BS, Exercise Physiologist;Joseph Hood RCP,RRT,BSRT    Virtual Visit  No    Medication changes reported      No    Fall or balance concerns reported     No    Warm-up and Cool-down  Performed on first and last piece of equipment    Resistance Training Performed  Yes    VAD Patient?  No    PAD/SET Patient?  No      Pain Assessment   Currently in Pain?  No/denies        Exercise Prescription Changes - 05/01/19 1000      Response to Exercise   Blood Pressure (Admit)  118/64    Blood Pressure (Exercise)  132/74    Blood Pressure (Exit)  124/62    Heart Rate (Admit)  52 bpm    Heart Rate (Exercise)  92 bpm    Oxygen Saturation (Admit)  99 %    Oxygen Saturation (Exercise)  100 %    Rating of Perceived Exertion (Exercise)  10    Perceived Dyspnea (Exercise)  1    Symptoms  claudication pain 3/5, SOB    Comments  walk test results       Social History   Tobacco Use  Smoking Status Current Every Day Smoker  . Packs/day: 1.00  . Years: 40.00  . Pack years: 40.00  . Types: Cigarettes  Smokeless Tobacco Never Used    Goals Met:  Exercise tolerated well Personal goals reviewed No report of cardiac concerns or symptoms  Goals Unmet:  Not Applicable  Comments: First full day of exercise!  Patient was oriented to gym and equipment including functions, settings, policies, and procedures.  Patient's individual exercise  prescription and treatment plan were reviewed.  All starting workloads were established based on the results of the 6 minute walk test done at initial orientation visit.  The plan for exercise progression was also introduced and progression will be customized based on patient's performance and goals.  [place] Name 05/01/19 1017         6 Minute Walk   Phase  Initial     Distance  1765 feet     Walk Time  6 minutes     # of Rest Breaks  0     MPH  3.34     METS  4.37     RPE  10     Perceived Dyspnea   1     VO2 Peak  15.3     Symptoms  Yes (comment)     Comments  claudication pain 3/5, SOB     Resting HR  52 bpm     Resting BP  118/64     Resting Oxygen Saturation   99 %     Exercise Oxygen Saturation  during 6 min walk  100 %  Max Ex. HR  92 bpm     Max Ex. BP  132/74     2 Minute Post BP  124/62         [name] is Medical Director for [name].

## 2019-05-03 ENCOUNTER — Encounter

## 2019-05-03 ENCOUNTER — Encounter: Payer: No Typology Code available for payment source | Admitting: *Deleted

## 2019-05-03 ENCOUNTER — Other Ambulatory Visit: Payer: Self-pay

## 2019-05-03 ENCOUNTER — Ambulatory Visit (INDEPENDENT_AMBULATORY_CARE_PROVIDER_SITE_OTHER): Payer: No Typology Code available for payment source | Admitting: Nurse Practitioner

## 2019-05-03 ENCOUNTER — Ambulatory Visit: Payer: Non-veteran care | Admitting: Nurse Practitioner

## 2019-05-03 ENCOUNTER — Encounter: Payer: Self-pay | Admitting: Nurse Practitioner

## 2019-05-03 VITALS — BP 120/80 | HR 59 | Temp 97.9°F | Ht 75.0 in | Wt 184.2 lb

## 2019-05-03 DIAGNOSIS — I2510 Atherosclerotic heart disease of native coronary artery without angina pectoris: Secondary | ICD-10-CM

## 2019-05-03 DIAGNOSIS — I213 ST elevation (STEMI) myocardial infarction of unspecified site: Secondary | ICD-10-CM | POA: Diagnosis not present

## 2019-05-03 DIAGNOSIS — I513 Intracardiac thrombosis, not elsewhere classified: Secondary | ICD-10-CM

## 2019-05-03 DIAGNOSIS — I10 Essential (primary) hypertension: Secondary | ICD-10-CM | POA: Diagnosis not present

## 2019-05-03 DIAGNOSIS — E785 Hyperlipidemia, unspecified: Secondary | ICD-10-CM

## 2019-05-03 DIAGNOSIS — I480 Paroxysmal atrial fibrillation: Secondary | ICD-10-CM

## 2019-05-03 DIAGNOSIS — I5020 Unspecified systolic (congestive) heart failure: Secondary | ICD-10-CM | POA: Diagnosis not present

## 2019-05-03 DIAGNOSIS — Z720 Tobacco use: Secondary | ICD-10-CM

## 2019-05-03 DIAGNOSIS — I739 Peripheral vascular disease, unspecified: Secondary | ICD-10-CM

## 2019-05-03 MED ORDER — METOPROLOL SUCCINATE ER 25 MG PO TB24: 12.5000 mg | ORAL_TABLET | Freq: Every day | ORAL | 3 refills | Status: AC

## 2019-05-03 NOTE — Progress Notes (Signed)
Office Visit    Patient Name: Brian Mcclure Date of Encounter: 05/03/2019  Primary Care Provider:  [facility] [place] Primary Cardiologist:  Kate Sable, MD  Chief Complaint    63 year old male with a history of CAD status post anterior MI and LAD stenting in August 2020, ischemic cardiomyopathy with an EF of 35 to 45%, HFrEF, LV apical thrombus on Coumadin, Paroxysmal atrial fibrillation, peripheral vascular disease status post prior aortoiliac stent graft, sinus and junctional bradycardia in the setting of anterior MI, hyperlipidemia, COPD, and tobacco abuse, who presents for follow-up of CAD and afib/flutter.  Past Medical History    Past Medical History:  Diagnosis Date   Actinic keratosis    Acute ST elevation myocardial infarction (STEMI) of anterior wall ([place])    a. 02/2019 s/p PCI/DES ->LAD.   Apical mural thrombus following MI ([place])    a. 02/2019 s/p Ant MI-->Echo: Small, fixed thrombus on the apical wall of LV-->coumadin.   Barrett's esophagus    CAD (coronary artery disease)    a. 02/2019 Ant STEMI/PCI: LM nl, LAD 100p (4x18 Resolute Onyx DES), LCX/RCA min irregs, EF 35-45%.   Chronic back pain    Colon polyps    a. 07/2017 Colonoscopy: 35mm sigmoid polyp, tubular adenoma, diverticulosis, int hemorrhoids.   COPD (chronic obstructive pulmonary disease) (HCC)    Degeneration of lumbar intervertebral disc    Dysphonia    GERD (gastroesophageal reflux disease)    HFrEF (heart failure with reduced ejection fraction) ([place])    a. 02/2019 Echo: EF 40-45%, apical, septal, inf, and inforlateral AK. Mid and apical anterior/mid anteroseptal AK. Mildly dil LA.   Hydrocele    Hyperlipidemia    Ischemic cardiomyopathy    a. 02/2019 Echo: EF 40-45%.   Myalgia    Osteoarthritis    C-spine   PAF (paroxysmal atrial fibrillation) (HCC)    a. CHA2DS2VASc = 2.   Peripheral vascular disease ([place])    a. 2018 s/p aorto-iliac stent graft.   Reactive  airway disease    Seborrheic keratosis    Sinus bradycardia    a. 02/2019 Jxnl rhythm and sinus brady in setting of Ant MI-->CCB d/c'd.   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   Tobacco use disorder    Tremor    Past Surgical History:  Procedure Laterality Date   [facility]   COLON[facility]COPY     COLONOSCOPY WITH PROPOFOL N/A 08/05/2017   Procedure: COLONOSCOPY WITH PROPOFOL;  Surgeon: Virgel Manifold, MD;  Location: [facility] ENDOSCOPY;  Service: Endoscopy;  Laterality: N/A;   CORONARY/GRAFT ACUTE MI REVASCULARIZATION N/A 03/12/2019   Procedure: Coronary/Graft Acute MI Revascularization;  Surgeon: Martinique, Peter M, MD;  Location: [place] CV LAB;  Service: Cardiovascular;  Laterality: N/A;   ESOPHAGOGASTRODUODENOSCOPY (EGD) WITH PROPOFOL N/A 08/05/2017   Procedure: ESOPHAGOGASTRODUODENOSCOPY (EGD) WITH PROPOFOL;  Surgeon: Virgel Manifold, MD;  Location: [facility] ENDOSCOPY;  Service: Endoscopy;  Laterality: N/A;   LEFT HEART CATH AND CORONARY ANGIOGRAPHY N/A 03/12/2019   Procedure: LEFT HEART CATH AND CORONARY ANGIOGRAPHY;  Surgeon: Martinique, Peter M, MD;  Location: [place] [facility] LAB;  Service: Cardiovascular;  Laterality: N/A;   NASAL SINUS SURGERY     several   SPLENECTOMY  01/2017    Allergies  Allergies  Allergen Reactions   Statins Other (See Comments)    Leg Cramps   Flunisolide Other (See Comments)    didn't work   Nicotine  Polacrilex [Nicotine] Other (See Comments)    didn't help stop smoking   Wellbutrin [Bupropion] Other (See Comments)    Caused somnolence (and didn't help to stop smoking)    History of Present Illness    63 year old male with the above complex past medical history including paroxysmal atrial fibrillation, peripheral vascular disease status post prior aortoiliac stent grafting, hyperlipidemia, tobacco abuse, COPD, and hyperlipidemia.  In August of this year, he presented with chest pain and  anterior ST segment elevation.  Diagnostic catheterization revealed a total occlusion of the proximal LAD with otherwise nonobstructive disease.  EF was 35 to 45% by left ventriculography.  He underwent successful PCI and drug-eluting stent placement to the proximal LAD.  Follow-up echo showed an EF of 40 to 45% with a small, fixed thrombus on the apical wall of the left ventricle.  As result, he was placed on Coumadin.  During his hospitalization he had junctional rhythm and sinus bradycardia resulting in discontinuation of his previous dose of diltiazem therapy.  He followed up in clinic on September 8, at which time he was doing well.  Unfortunately, he was seen in the emergency department later that evening secondary to palpitations and atrial flutter at 153 bpm.  This resolved prior to intervention.  Case was discussed with our office and low-dose metoprolol was added however, patient was reluctant due to prior history of fatigue on any dose of metoprolol higher than 12.5 mg.  Since his ER visit, he has had intermittent palpitations occurring about twice a week and lasting anywhere between an hour and 6 hours.  He thinks he can tell the difference between A. fib and a flutter as his flutter is usually more fast and regular and his fib is usually in the 80s to 90s.  Despite being faster, he actually feels better in the flutter than he does the fib.  He sometimes notes mild chest discomfort.  In questioning him further about his history of atrial fibrillation and flutter, he notes that this is been going on for greater than a year.  He has been [facility] at the [facility] for this.  He had previously tried long-acting and short acting metoprolol with little improvement.  He was on flecainide at one point with no change.  Diltiazem is the only thing that seemed to impact his frequency of symptoms.  [facility] and it sounds like there was a discussion about possible catheter ablation  but this never ended up happening.  At this point, he is not currently interested in rereferral to EP.  He has been exercising at cardiac rehabilitation without any chest pain or dyspnea.  He continues to smoke a pack a day and does not have a plan for quitting currently.  He denies PND, orthopnea, dizziness, syncope, edema, or early satiety.  He has been compliant with his medications and came off of aspirin at the end of September as planned.  Home Medications    Prior to Admission medications   Medication Sig Start Date End Date Taking? Authorizing Provider  Ca Carbonate-Mag Hydroxide (ROLAIDS PO) Take 1 tablet by mouth at bedtime as needed (heartburn/acid reflux).   Yes [provider]  cholecalciferol (VITAMIN D) 25 MCG (1000 UT) tablet Take 1,000 Units by mouth 2 (two) times daily. 03/08/19  Yes [provider]  clopidogrel (PLAVIX) 75 MG tablet Take 1 tablet (75 mg total) by mouth daily. 04/18/19  Yes Minna Merritts, MD  diclofenac sodium (VOLTAREN)  1 % GEL Apply 1 application topically daily as needed (pain).   Yes [provider]  famotidine (PEPCID) 20 MG tablet Take 20 mg by mouth daily.  04/06/19  Yes [provider]  losartan (COZAAR) 25 MG tablet Take 0.5 tablets (12.5 mg total) by mouth daily. 03/28/19 06/26/19 Yes Theora Gianotti, NP  nitroGLYCERIN (NITROSTAT) 0.4 MG SL tablet Place 1 tablet (0.4 mg total) under the tongue every 5 (five) minutes x 3 doses as needed for chest pain. 03/14/19  Yes Bhagat, Bhavinkumar, PA  oxymetazoline (AFRIN) 0.05 % nasal spray Place 1 spray into both nostrils 2 (two) times daily as needed for congestion.   Yes [provider]  rosuvastatin (CRESTOR) 40 MG tablet Take 1 tablet (40 mg total) by mouth at bedtime. 03/14/19  Yes Bhagat, Bhavinkumar, PA  Simethicone (GAS-X PO) Take 1 tablet by mouth 3 (three) times daily as needed (heartburn).   Yes [provider]  warfarin (COUMADIN) 5 MG tablet  Take 7.5 mg ( 1 1/2 tables) every night with dinner until INR check on Friday) 03/14/19  Yes Bhagat, Bhavinkumar, PA    Review of Systems    Ongoing intermittent palpitations which are longstanding but more frequent since coming off of diltiazem after his hospitalization for MI.  He occasionally notes mild chest pressure in the setting of rapid atrial flutter or fib.  He denies dyspnea, PND, orthopnea, dizziness, syncope, edema, or early satiety.  All other systems reviewed and are otherwise negative except as noted above.  Physical Exam    VS:  BP 120/80 (BP Location: Left Arm, Patient Position: Sitting, Cuff Size: Normal)    Pulse (!) 59    Temp 97.9 F (36.6 C)    Ht 6\' 3"  (1.905 m)    Wt 184 lb 4 oz (83.6 kg)    SpO2 98%    BMI 23.03 kg/m  , BMI Body mass index is 23.03 kg/m. GEN: Well nourished, well developed, in no acute distress. HEENT: normal. Neck: Supple, no JVD, carotid bruits, or masses. Cardiac: RRR, no murmurs, rubs, or gallops. No clubbing, cyanosis, edema.  Radials/PT 2+ and equal bilaterally.  Respiratory:  Respirations regular and unlabored, diminished breath sounds bilaterally. GI: Soft, nontender, nondistended, BS + x 4. MS: no deformity or atrophy. Skin: warm and dry, no rash. Neuro:  Strength and sensation are intact. Psych: Normal affect.  Accessory Clinical Findings    ECG personally reviewed by me today -sinus bradycardia, 59, anterior T wave inversion- no acute changes.  Lab Results  Component Value Date   WBC 12.7 (H) 03/28/2019   HGB 15.4 03/28/2019   HCT 45.8 03/28/2019   MCV 92.2 03/28/2019   PLT 333 03/28/2019   Lab Results  Component Value Date   CREATININE 1.00 03/28/2019   BUN 14 03/28/2019   NA 139 03/28/2019   K 4.0 03/28/2019   CL 104 03/28/2019   CO2 25 03/28/2019   Lab Results  Component Value Date   ALT 17 03/12/2019   AST 19 03/12/2019   ALKPHOS 59 03/12/2019   BILITOT 0.7 03/12/2019   Lab Results  Component Value Date    CHOL 104 03/12/2019   HDL 31 (L) 03/12/2019   LDLCALC 59 03/12/2019   TRIG 70 03/12/2019   CHOLHDL 3.4 03/12/2019    Lab Results  Component Value Date   INR 2.6 04/19/2019   INR 2.7 04/05/2019   INR 5.8 (A) 03/29/2019    Assessment & Plan  1.  Coronary artery disease: Status post anterior STEMI with drug-eluting stent placement to the LAD in late August of this year.  He has done well since his last visit and has been participating in cardiac rehab without symptoms or limitations.  He sometimes notes mild chest pressure in the setting of rapid atrial fibrillation or flutter though notes good exercise tolerance otherwise.  He remains on ARB, statin, and Plavix therapy.  No aspirin in the setting of ongoing warfarin.  I will follow-up lipids and LFTs today as it has been roughly 6 weeks since his dose was titrated.  I will also add low-dose metoprolol succinate-12.5 mg daily in the setting of ischemic cardiomyopathy and ongoing paroxysmal A. fib and flutter.  2.  Ischemic cardiomyopathy/HFrEF: EF 40 to 45% by echocardiogram post MI.  He is euvolemic on examination and doing well at home.  He remains on ARB therapy and as outlined above, I am adding low-dose metoprolol succinate.  Plan to follow-up an echo in late November to reevaluate LV function.  3.  Paroxysmal atrial fibrillation and flutter: He was previously managed with diltiazem after not being adequately controlled on low-dose beta-blocker or flecainide in the past (has been seen by EP [facility]).  Diltiazem was discontinued during hospitalization post MI in the setting of bradycardia and junctional rhythm as well as LV dysfunction.  Over the past year, he has been accustomed to experiencing paroxysms of A. fib or flutter a couple times a week though while he was on diltiazem, this was reduced to once a month or less.  Since his MI, he has been having symptoms up to 2-3 times per week, lasting from an hour to about 6 hours.  He  thinks his A. fib rates are usually in the 80s to 90s and atrial flutter can get into the 150s, as it did in September, when he was seen in the ER.  Of the 2, he thinks his A. fib is the more likely to occur.  We discussed potential options for management including adding back a low-dose of Toprol XL 12.5 mg daily.  He says though higher doses of beta-blocker made him fatigued, that he thinks he knows that he can tolerate that dose.  He follows his heart rate and blood pressure closely and will continue to do so.  I advised that if he develops bradycardia with rates less than 50, we will need to discontinue.  In the long run, I think his A. fib and flutter will need repeat evaluation by electrophysiology (? Candidacy for catheter ablation vs AAD) and I offered to make him a referral today however, he says he is not as concerned about his A. fib and flutter as he is about his recent heart attack and cardiomyopathy and would like to hold off on EP referral.  He remains anticoagulated on warfarin in the setting of an LV thrombus at the time of his anterior MI.  Plan for follow-up echo in November and presuming thrombus has resolved, we could likely switch him from warfarin to Eliquis.  4.  LV apical thrombus: He is anticoagulated on warfarin and is followed closely in our warfarin clinic.  INRs have been therapeutic.  As outlined above, follow-up echo in November to reevaluate LV function.  Provided that LV thrombus has resolved, would plan to switch him over to Eliquis for long-term anticoagulation in the setting of paroxysmal atrial arrhythmias.  5.  Essential hypertension: Stable.  6.  Hyperlipidemia: Follow-up lipids  and LFTs as his Crestor dose was escalated in August.  7.  Ongoing tobacco abuse: Still smoking a pack a day.  He does not currently have any plans to quit.  Complete cessation advised.  8.  Peripheral arterial disease: Status post prior aortoiliac stent grafting at the [place].  This is followed  by vascular surgery over there.  9.  Disposition: 45 minutes discussing diagnoses and plan of care.  Follow-up lipids and LFTs today.  Follow-up echo in late November with office follow-up in December.  Patient will continue to consider EP referral.  Murray Hodgkins, NP 05/03/2019, 3:53 PM

## 2019-05-03 NOTE — Patient Instructions (Signed)
Medication Instructions:  1- START Toprol Take 0.5 tablets (12.5 mg total) by mouth daily. If you need a refill on your cardiac medications before your next appointment, please call your pharmacy.   Lab work: 1- Your physician recommends that you have lab work today(LFT, lipids)  If you have labs (blood work) drawn today and your tests are completely normal, you will receive your results only by: Marland Kitchen MyChart Message (if you have MyChart) OR . A paper copy in the mail If you have any lab test that is abnormal or we need to change your treatment, we will call you to review the results.  Testing/Procedures: 1- Echo  Please return to [facility] on __Late Nov/early Dec____________ at _______________ AM/PM for an Echocardiogram. Your physician has requested that you have an echocardiogram. Echocardiography is a painless test that uses sound waves to create images of your heart. It provides your doctor with information about the size and shape of your heart and how well your heart's chambers and valves are working. This procedure takes approximately one hour. There are no restrictions for this procedure. Please note; depending on visual quality an IV may need to be placed.    Follow-Up: At [facility], you and your health needs are our priority.  As part of our continuing mission to provide you with exceptional heart care, we have created designated Provider Care Teams.  These Care Teams include your primary Cardiologist (physician) and Advanced Practice Providers (APPs -  Physician Assistants and Nurse Practitioners) who all work together to provide you with the care you need, when you need it. You will need a follow up appointment in 2 months.  You may see Kate Sable, MD or Murray Hodgkins, NP.

## 2019-05-03 NOTE — Progress Notes (Signed)
Daily Session Note  Patient Details  Name: Brian Mcclure MRN: [id number] Date of Birth: [birth year] Referring Provider:     Cardiac Rehab from 05/01/2019 in [facility]iac and Pulmonary Re[facility]  Referring Provider  Lynda Rainwater, MD      Encounter Date: 05/03/2019  Check In: Session Check In - 05/03/19 0819      Check-In   Supervising physician immediately available to respond to emergencies  See telemetry face sheet for immediately available ER MD    Location  [facility]-Cardiac & Pulmonary Rehab    Staff Present  Gerlene Burdock, RN, BSN-BC, CCRP;Joseph Yates [place]amp [place], [place], RCEP, CCRP, CCET    Virtual Visit  No    Medication changes reported      No    Fall or balance concerns reported     No    Warm-up and Cool-down  Performed on first and last piece of equipment    Resistance Training Performed  Yes    VAD Patient?  No    PAD/SET Patient?  No      Pain Assessment   Currently in Pain?  No/denies          Social History   Tobacco Use  Smoking Status Current Every Day Smoker  . Packs/day: 1.00  . Years: 40.00  . Pack years: 40.00  . Types: Cigarettes  Smokeless Tobacco Never Used    Goals Met:  Independence with exercise equipment Exercise tolerated well No report of cardiac concerns or symptoms Strength training completed today  Goals Unmet:  Not Applicable  Comments: Pt able to follow exercise prescription today without complaint.  Will continue to monitor for progression.    [name] is Medical Director for [place] and [name].

## 2019-05-04 ENCOUNTER — Telehealth: Payer: Self-pay

## 2019-05-04 LAB — HEPATIC FUNCTION PANEL
ALT: 24 IU/L (ref 0–44)
AST: 17 IU/L (ref 0–40)
Albumin: 4.7 g/dL (ref 3.8–4.8)
Alkaline Phosphatase: 73 IU/L (ref 39–117)
Bilirubin Total: 0.4 mg/dL (ref 0.0–1.2)
Bilirubin, Direct: 0.11 mg/dL (ref 0.00–0.40)
Total Protein: 6.9 g/dL (ref 6.0–8.5)

## 2019-05-04 LAB — LIPID PANEL
Chol/HDL Ratio: 2.8 ratio (ref 0.0–5.0)
Cholesterol, Total: 96 mg/dL — ABNORMAL LOW (ref 100–199)
HDL: 34 mg/dL — ABNORMAL LOW (ref >39)
LDL Chol Calc (NIH): 46 mg/dL (ref 0–99)
Triglycerides: 79 mg/dL (ref 0–149)
VLDL Cholesterol Cal: 16 mg/dL (ref 5–40)

## 2019-05-04 NOTE — Telephone Encounter (Signed)
-----   Message from Theora Gianotti, NP sent at 05/04/2019  7:57 AM EDT ----- LDL @ goal - 46.  LFTs wnl.

## 2019-05-04 NOTE — Telephone Encounter (Signed)
Results released to mychart. Pt not viewed at this time. Made call to patient to make him aware that labs WNL. No new orders at this time.   Advised pt to call for any further questions or concerns.

## 2019-05-08 ENCOUNTER — Encounter: Payer: No Typology Code available for payment source | Admitting: *Deleted

## 2019-05-08 ENCOUNTER — Ambulatory Visit (INDEPENDENT_AMBULATORY_CARE_PROVIDER_SITE_OTHER): Payer: No Typology Code available for payment source

## 2019-05-08 ENCOUNTER — Other Ambulatory Visit: Payer: Self-pay

## 2019-05-08 DIAGNOSIS — Z5181 Encounter for therapeutic drug level monitoring: Secondary | ICD-10-CM

## 2019-05-08 DIAGNOSIS — I236 Thrombosis of atrium, auricular appendage, and ventricle as current complications following acute myocardial infarction: Secondary | ICD-10-CM

## 2019-05-08 DIAGNOSIS — I213 ST elevation (STEMI) myocardial infarction of unspecified site: Secondary | ICD-10-CM

## 2019-05-08 DIAGNOSIS — I480 Paroxysmal atrial fibrillation: Secondary | ICD-10-CM

## 2019-05-08 LAB — POCT INR: INR: 2.3 (ref 2.0–3.0)

## 2019-05-08 NOTE — Patient Instructions (Signed)
Please continue dosage of warfarin 2.5 mg every day. Recheck in 4 weeks.

## 2019-05-08 NOTE — Progress Notes (Signed)
Daily Session Note  Patient Details  Name: Brian Mcclure MRN: [id number] Date of Birth: 1955-09-11 Referring Provider:     Cardiac Rehab from 05/01/2019 in [facility] Cardiac and Pulmonary Rehab  Referring Provider  Lynda Rainwater, MD      Encounter Date: 05/08/2019  Check In: Session Check In - 05/08/19 0920      Check-In   Supervising physician immediately available to respond to emergencies  See telemetry face sheet for immediately available ER MD    Location  [facility]-Cardiac & Pulmonary Re[facility]    Staff Present  Heath Lark, RN, BSN, Laveda Norman, BS, ACSM CEP, Exercise Physiologist;Joseph Tessie Fass RCP,RRT,BSRT    Virtual Visit  No    Medication changes reported      No    Fall or balance concerns reported     No    Warm-up and Cool-down  Performed on first and last piece of equipment    Resistance Training Performed  Yes    VAD Patient?  No    PAD/SET Patient?  No      Pain Assessment   Currently in Pain?  No/denies          Social History   Tobacco Use  Smoking Status Current Every Day Smoker  . Packs/day: 1.00  . Years: 40.00  . Pack years: 40.00  . Types: Cigarettes  Smokeless Tobacco Never Used    Goals Met:  Independence with exercise equipment Exercise tolerated well Personal goals reviewed No report of cardiac concerns or symptoms  Goals Unmet:  Not Applicable  Comments: Pt able to follow exercise prescription today without complaint.  Will continue to monitor for progression.  Reviewed home exercise with pt today.  Pt plans to walk and do yard work for exercise.  Reviewed THR, pulse, RPE, sign and symptoms, NTG use, and when to call 911 or MD.  Also discussed weather considerations and indoor options.  Pt voiced understanding.    [name] is Medical Director for [place] and [name].

## 2019-05-10 ENCOUNTER — Other Ambulatory Visit: Payer: Self-pay

## 2019-05-10 ENCOUNTER — Encounter: Payer: No Typology Code available for payment source | Admitting: *Deleted

## 2019-05-10 DIAGNOSIS — I213 ST elevation (STEMI) myocardial infarction of unspecified site: Secondary | ICD-10-CM

## 2019-05-10 NOTE — Progress Notes (Signed)
Daily Session Note  Patient Details  Name: Brian Mcclure MRN: [id number] Date of Birth: 02/09/1956 Referring Provider:     Cardiac Rehab from 05/01/2019 in [facility] Cardiac and Pulmonary Rehab  Referring Provider  Lynda Rainwater, MD      Encounter Date: 05/10/2019  Check In: Session Check In - 05/10/19 0844      Check-In   Supervising physician immediately available to respond to emergencies  See telemetry face sheet for immediately available ER MD    Staff Present  Darel Hong, RN BSN;Jessica Luan Pulling, MA, RCEP, CCRP, CCET;Joseph Hood RCP,RRT,BSRT    Virtual Visit  No    Medication changes reported      No    Fall or balance concerns reported     No    Warm-up and Cool-down  Performed on first and last piece of equipment    Resistance Training Performed  Yes    VAD Patient?  No    PAD/SET Patient?  No      Pain Assessment   Currently in Pain?  No/denies          Social History   Tobacco Use  Smoking Status Current Every Day Smoker  . Packs/day: 1.00  . Years: 40.00  . Pack years: 40.00  . Types: Cigarettes  Smokeless Tobacco Never Used    Goals Met:  Independence with exercise equipment Exercise tolerated well Personal goals reviewed No report of cardiac concerns or symptoms Strength training completed today  Goals Unmet:  Not Applicable  Comments: Pt able to follow exercise prescription today without complaint.  Will continue to monitor for progression.    [name] is Medical Director for [place] and [name].

## 2019-05-15 ENCOUNTER — Other Ambulatory Visit: Payer: Self-pay

## 2019-05-15 ENCOUNTER — Encounter: Payer: No Typology Code available for payment source | Admitting: *Deleted

## 2019-05-15 DIAGNOSIS — I213 ST elevation (STEMI) myocardial infarction of unspecified site: Secondary | ICD-10-CM | POA: Diagnosis not present

## 2019-05-15 NOTE — Progress Notes (Signed)
Daily Session Note  Patient Details  Name: Brian Mcclure MRN: [id number] Date of Birth: July 11, 1956 Referring Provider:     Cardiac Rehab from 05/01/2019 in [facility] Cardiac and Pulmonary Rehab  Referring Provider  Lynda Rainwater, MD      Encounter Date: 05/15/2019  Check In: Session Check In - 05/15/19 0955      Check-In   Supervising physician immediately available to respond to emergencies  See telemetry face sheet for immediately available ER MD    Location  [facility] Pulmonary Rehab    Staff Present  Heath Lark, RN, BSN, Laveda Norman, BS, ACSM CEP, Exercise Physiologist;Joseph Tessie Fass RCP,RRT,BSRT    Virtual Visit  No    Medication changes reported      No    Fall or balance concerns reported     No    Warm-up and Cool-down  Performed on first and last piece of equipment    Resistance Training Performed  Yes    VAD Patient?  No    PAD/SET Patient?  No      Pain Assessment   Currently in Pain?  No/denies          Social History   Tobacco Use  Smoking Status Current Every Day Smoker  . Packs/day: 1.00  . Years: 40.00  . Pack years: 40.00  . Types: Cigarettes  Smokeless Tobacco Never Used    Goals Met:  Independence with exercise equipment Exercise tolerated well No report of cardiac concerns or symptoms  Goals Unmet:  Not Applicable  Comments: Pt able to follow exercise prescription today without complaint.  Will continue to monitor for progression.    [name] is Medical Director for [place] and [name].

## 2019-05-18 ENCOUNTER — Other Ambulatory Visit: Payer: Self-pay

## 2019-05-18 ENCOUNTER — Encounter: Payer: No Typology Code available for payment source | Admitting: *Deleted

## 2019-05-18 DIAGNOSIS — I213 ST elevation (STEMI) myocardial infarction of unspecified site: Secondary | ICD-10-CM | POA: Diagnosis not present

## 2019-05-18 NOTE — Progress Notes (Signed)
Daily Session Note  Patient Details  Name: FAIZAAN FALLS MRN: [id number] Date of Birth: 1956-03-21 Referring Provider:     Cardiac Rehab from 05/01/2019 in [facility] Cardiac and Pulmonary Rehab  Referring Provider  Lynda Rainwater, MD      Encounter Date: 05/18/2019  Check In: Session Check In - 05/18/19 1012      Check-In   Supervising physician immediately available to respond to emergencies  See telemetry face sheet for immediately available ER MD    Location  [facility]-Cardiac & Pulmonary Rehab    Staff Present  Heath Lark, RN, BSN, CCRP;Jeanna Durrell BS, Exercise Physiologist;Amanda Oletta Darter, BA, ACSM CEP, Exercise Physiologist    Virtual Visit  No    Medication changes reported      No    Fall or balance concerns reported     No    Warm-up and Cool-down  Performed on first and last piece of equipment    Resistance Training Performed  Yes    VAD Patient?  No    PAD/SET Patient?  No      Pain Assessment   Currently in Pain?  No/denies          Social History   Tobacco Use  Smoking Status Current Every Day Smoker  . Packs/day: 1.00  . Years: 40.00  . Pack years: 40.00  . Types: Cigarettes  Smokeless Tobacco Never Used    Goals Met:  Independence with exercise equipment Exercise tolerated well No report of cardiac concerns or symptoms  Goals Unmet:  Not Applicable  Comments: Pt able to follow exercise prescription today without complaint.  Will continue to monitor for progression.    [name] is Medical Director for [name].

## 2019-05-22 ENCOUNTER — Other Ambulatory Visit: Payer: Self-pay

## 2019-05-22 ENCOUNTER — Encounter: Payer: No Typology Code available for payment source | Attending: Internal Medicine | Admitting: *Deleted

## 2019-05-22 DIAGNOSIS — I213 ST elevation (STEMI) myocardial infarction of unspecified site: Secondary | ICD-10-CM | POA: Insufficient documentation

## 2019-05-22 NOTE — Progress Notes (Signed)
Daily Session Note  Patient Details  Name: Brian Mcclure MRN: [id number] Date of Birth: 1956/01/26 Referring Provider:     Cardiac Rehab from 05/01/2019 in [facility] Cardiac and Pulmonary Rehab  Referring Provider  Lynda Rainwater, MD      Encounter Date: 05/22/2019  Check In: Session Check In - 05/22/19 0946      Check-In   Supervising physician immediately available to respond to emergencies  See telemetry face sheet for immediately available ER MD    Location  [facility]-Cardiac & Pulmonary Rehab    Staff Present  Heath Lark, RN, BSN, CCRP;Joseph Hood RCP,RRT,BSRT;Kelly [place], [place], ACSM CEP, Exercise Physiologist    Virtual Visit  No    Medication changes reported      No    Fall or balance concerns reported     No    Warm-up and Cool-down  Performed on first and last piece of equipment    Resistance Training Performed  Yes    VAD Patient?  No      Pain Assessment   Currently in Pain?  No/denies          Social History   Tobacco Use  Smoking Status Current Every Day Smoker  . Packs/day: 1.00  . Years: 40.00  . Pack years: 40.00  . Types: Cigarettes  Smokeless Tobacco Never Used    Goals Met:  Independence with exercise equipment Exercise tolerated well No report of cardiac concerns or symptoms  Goals Unmet:  Not Applicable  Comments: Pt able to follow exercise prescription today without complaint.  Will continue to monitor for progression.    [name] is Medical Director for [name].

## 2019-05-24 ENCOUNTER — Encounter: Payer: Self-pay | Admitting: *Deleted

## 2019-05-24 DIAGNOSIS — I213 ST elevation (STEMI) myocardial infarction of unspecified site: Secondary | ICD-10-CM

## 2019-05-24 NOTE — Progress Notes (Signed)
Cardiac Individual Treatment Plan  Patient Details  Name: Brian Mcclure MRN: [id number] Date of Birth: [birth year] Referring Provider:     Cardiac Rehab from 05/01/2019 in [facility] Cardiac and Pulmonary Rehab  Referring Provider  Lynda Rainwater, MD      Initial Encounter Date:    Cardiac Rehab from 05/01/2019 in [facility] Cardiac and Pulmonary Rehab  Date  05/01/19      Visit Diagnosis: ST elevation myocardial infarction (STEMI), unspecified artery ([place])  Patient's Home Medications on Admission:  Current Outpatient Medications:  .  Ca Carbonate-Mag Hydroxide (ROLAIDS PO), Take 1 tablet by mouth at bedtime as needed (heartburn/acid reflux)., Disp: , Rfl:  .  cholecalciferol (VITAMIN D) 25 MCG (1000 UT) tablet, Take 1,000 Units by mouth 2 (two) times daily., Disp: , Rfl:  .  clopidogrel (PLAVIX) 75 MG tablet, Take 1 tablet (75 mg total) by mouth daily., Disp: 30 tablet, Rfl: 0 .  diclofenac sodium (VOLTAREN) 1 % GEL, Apply 1 application topically daily as needed (pain)., Disp: , Rfl:  .  famotidine (PEPCID) 20 MG tablet, Take 20 mg by mouth daily. , Disp: , Rfl:  .  losartan (COZAAR) 25 MG tablet, Take 0.5 tablets (12.5 mg total) by mouth daily., Disp: 45 tablet, Rfl: 2 .  metoprolol succinate (TOPROL XL) 25 MG 24 hr tablet, Take 0.5 tablets (12.5 mg total) by mouth daily., Disp: 45 tablet, Rfl: 3 .  nitroGLYCERIN (NITROSTAT) 0.4 MG SL tablet, Place 1 tablet (0.4 mg total) under the tongue every 5 (five) minutes x 3 doses as needed for chest pain., Disp: 25 tablet, Rfl: 12 .  oxymetazoline (AFRIN) 0.05 % nasal spray, Place 1 spray into both nostrils 2 (two) times daily as needed for congestion., Disp: , Rfl:  .  rosuvastatin (CRESTOR) 40 MG tablet, Take 1 tablet (40 mg total) by mouth at bedtime., Disp: 30 tablet, Rfl: 6 .  Simethicone (GAS-X PO), Take 1 tablet by mouth 3 (three) times daily as needed (heartburn)., Disp: , Rfl:  .  warfarin (COUMADIN) 5 MG tablet, Take 7.5 mg ( 1 1/2 tables)  every night with dinner until INR check on Friday), Disp: 30 tablet, Rfl: 0  Past Medical History: Past Medical History:  Diagnosis Date  . Actinic keratosis   . Acute ST elevation myocardial infarction (STEMI) of anterior wall ([place])    a. 02/2019 s/p PCI/DES ->LAD.  Marland Kitchen Apical mural thrombus following MI ([place])    a. 02/2019 s/p Ant MI-->Echo: Small, fixed thrombus on the apical wall of LV-->coumadin.  . Barrett's esophagus   . CAD (coronary artery disease)    a. 02/2019 Ant STEMI/PCI: LM nl, LAD 100p (4x18 Resolute Onyx DES), LCX/RCA min irregs, EF 35-45%.  . Chronic back pain   . Colon polyps    a. 07/2017 Colonoscopy: 74m sigmoid polyp, tubular adenoma, diverticulosis, int hemorrhoids.  .Mar[place] KitchenCOPD (chronic obstructive pulmonary disease) ([place]   . Degeneration of lumbar intervertebral disc   . Dysphonia   . GERD (gastroesophageal reflux disease)   . HFrEF (heart failure with reduced ejection fraction) ([place]    a. 02/2019 Echo: EF 40-45%, apical, septal, inf, and inforlateral AK. Mid and apical anterior/mid anteroseptal AK. Mildly dil LA.  .Marland KitchenHydrocele   . Hyperlipidemia   . Ischemic cardiomyopathy    a. 02/2019 Echo: EF 40-45%.  . Myalgia   . Osteoarthritis    C-spine  . PAF (paroxysmal atrial fibrillation) ([facility])    a. CHA2DS2VASc = 2.  . Peripheral vascular disease (  [place])    a. 2018 s/p aorto-iliac stent graft.  . Reactive airway disease   . Seborrheic keratosis   . Sinus bradycardia    a. 02/2019 Jxnl rhythm and sinus brady in setting of Ant MI-->[facility] d/c'd.  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  . Tobacco use disorder   . Tremor     Tobacco Use: Social History   Tobacco Use  Smoking Status Current Every Day Smoker  . Packs/day: 1.00  . Years: 40.00  . Pack years: 40.00  . Types: Cigarettes  Smokeless Tobacco Never Used    Labs: Recent Review Flowsheet Data    Labs for ITP Cardiac and Pulmonary Rehab Latest Ref Rng & Units 03/12/2019 05/03/2019    Cholestrol 100 - 199 mg/dL 104 96(L)   LDLCALC 0 - 99 mg/dL 59 46   HDL >39 mg/dL 31(L) 34(L)   Trlycerides 0 - 149 mg/dL 70 79   Hemoglobin A1c 4.8 - 5.6 % 6.0(H) -   TCO2 22 - 32 mmol/L 18(L) -       Exercise Target Goals: Exercise Program Goal: Individual exercise prescription set using results from initial 6 min walk test and THRR while considering  patient's activity barriers and safety.   Exercise Prescription Goal: Initial exercise prescription builds to 30-45 minutes a day of aerobic activity, 2-3 days per week.  Home exercise guidelines will be given to patient during program as part of exercise prescription that the participant will acknowledge.  Activity Barriers & Risk Stratification: Activity Barriers & Cardiac Risk Stratification - 05/01/19 1017      Activity Barriers & Cardiac Risk Stratification   Activity Barriers  Joint Problems;Back Problems;Muscular Weakness;Deconditioning;Other (comment)    Comments  claudication pain    Cardiac Risk Stratification  High       6 Minute Walk: 6 Minute Walk    Row Name 05/01/19 1017         6 Minute Walk   Phase  Initial     Distance  1765 feet     Walk Time  6 minutes     # of Rest Breaks  0     MPH  3.34     METS  4.37     RPE  10     Perceived Dyspnea   1     VO2 Peak  15.3     Symptoms  Yes (comment)     Comments  claudication pain 3/5, SOB     Resting HR  52 bpm     Resting BP  118/64     Resting Oxygen Saturation   99 %     Exercise Oxygen Saturation  during 6 min walk  100 %     Max Ex. HR  92 bpm     Max Ex. BP  132/74     2 Minute Post BP  124/62        Oxygen Initial Assessment:   Oxygen Re-Evaluation:   Oxygen Discharge (Final Oxygen Re-Evaluation):   Initial Exercise Prescription: Initial Exercise Prescription - 05/01/19 1000      Date of Initial Exercise RX and Referring Provider   Date  05/01/19    Referring Provider  Lynda Rainwater, MD      Treadmill   MPH  3    Grade  1     Minutes  15    METs  3.71      Recumbant Bike   Level  3    RPM  50    Watts  63    Minutes  15    METs  3      NuStep   Level  4    SPM  80    Minutes  15    METs  3      Prescription Details   Frequency (times per week)  2    Duration  Progress to 30 minutes of continuous aerobic without signs/symptoms of physical distress      Intensity   THRR 40-80% of Max Heartrate  94-136    Ratings of Perceived Exertion  11-13    Perceived Dyspnea  0-4      Progression   Progression  Continue to progress workloads to maintain intensity without signs/symptoms of physical distress.      Resistance Training   Training Prescription  Yes    Weight  4 lbs    Reps  10-15       Perform Capillary Blood Glucose checks as needed.  Exercise Prescription Changes: Exercise Prescription Changes    Row Name 05/01/19 1000 05/18/19 1400           Response to Exercise   Blood Pressure (Admit)  118/64  122/64      Blood Pressure (Exercise)  132/74  134/64      Blood Pressure (Exit)  124/62  96/56      Heart Rate (Admit)  52 bpm  83 bpm      Heart Rate (Exercise)  92 bpm  117 bpm      Heart Rate (Exit)  -  82 bpm      Oxygen Saturation (Admit)  99 %  -      Oxygen Saturation (Exercise)  100 %  -      Rating of Perceived Exertion (Exercise)  10  -      Perceived Dyspnea (Exercise)  1  -      Symptoms  claudication pain 3/5, SOB  none      Comments  walk test results  -      Duration  -  Continue with 30 min of aerobic exercise without signs/symptoms of physical distress.      Intensity  -  THRR unchanged        Progression   Progression  -  Continue to progress workloads to maintain intensity without signs/symptoms of physical distress.      Average METs  -  4.05        Resistance Training   Training Prescription  -  Yes      Weight  -  0 ROM only due to shoulder problem      Reps  -  10-15        Treadmill   MPH  -  3.5      Grade  -  1      Minutes  -  15      METs  -  4.16         T5 Nustep   Level  -  7      SPM  -  80      Minutes  -  15      METs  -  4         Exercise Comments: Exercise Comments    Row Name 05/01/19 0948           Exercise Comments  First full day of exercise!  Patient was oriented to gym and equipment including functions, settings, policies, and procedures.  Patient's individual exercise prescription and treatment plan were reviewed.  All starting workloads were established based on the results of the 6 minute walk test done at initial orientation visit.  The plan for exercise progression was also introduced and progression will be customized based on patient's performance and goals.          Exercise Goals and Review: Exercise Goals    Row Name 05/01/19 1021             Exercise Goals   Increase Physical Activity  Yes       Intervention  Provide advice, education, support and counseling about physical activity/exercise needs.;Develop an individualized exercise prescription for aerobic and resistive training based on initial evaluation findings, risk stratification, comorbidities and participant's personal goals.       Expected Outcomes  Short Term: Attend rehab on a regular basis to increase amount of physical activity.;Long Term: Add in home exercise to make exercise part of routine and to increase amount of physical activity.;Long Term: Exercising regularly at least 3-5 days a week.       Increase Strength and Stamina  Yes       Intervention  Provide advice, education, support and counseling about physical activity/exercise needs.;Develop an individualized exercise prescription for aerobic and resistive training based on initial evaluation findings, risk stratification, comorbidities and participant's personal goals.       Expected Outcomes  Short Term: Increase workloads from initial exercise prescription for resistance, speed, and METs.;Short Term: Perform resistance training exercises routinely during rehab and add in  resistance training at home;Long Term: Improve cardiorespiratory fitness, muscular endurance and strength as measured by increased METs and functional capacity (6MWT)       Able to understand and use rate of perceived exertion (RPE) scale  Yes       Intervention  Provide education and explanation on how to use RPE scale       Expected Outcomes  Short Term: Able to use RPE daily in rehab to express subjective intensity level;Long Term:  Able to use RPE to guide intensity level when exercising independently       Able to understand and use Dyspnea scale  Yes       Intervention  Provide education and explanation on how to use Dyspnea scale       Expected Outcomes  Short Term: Able to use Dyspnea scale daily in rehab to express subjective sense of shortness of breath during exertion;Long Term: Able to use Dyspnea scale to guide intensity level when exercising independently       Knowledge and understanding of Target Heart Rate Range (THRR)  Yes       Intervention  Provide education and explanation of THRR including how the numbers were predicted and where they are located for reference       Expected Outcomes  Short Term: Able to state/look up THRR;Short Term: Able to use daily as guideline for intensity in rehab;Long Term: Able to use THRR to govern intensity when exercising independently       Able to check pulse independently  Yes       Intervention  Provide education and demonstration on how to check pulse in carotid and radial arteries.;Review the importance of being able to check your own pulse for safety during independent exercise       Expected Outcomes  Short Term: Able to explain why pulse checking is  important during independent exercise;Long Term: Able to check pulse independently and accurately       Understanding of Exercise Prescription  Yes       Intervention  Provide education, explanation, and written materials on patient's individual exercise prescription       Expected Outcomes  Short  Term: Able to explain program exercise prescription;Long Term: Able to explain home exercise prescription to exercise independently       Improve claudication pain toleration; Improve walking ability  Yes       Intervention  Participate in PAD/SET Rehab 2-3 days a week, walking at home as part of exercise prescription;Attend education sessions to aid in risk factor modification and understanding of disease process       Expected Outcomes  Short Term: Improve walking distance/time to onset of claudication pain;Long Term: Improve walking ability and toleration to claudication          Exercise Goals Re-Evaluation : Exercise Goals Re-Evaluation    Row Name 05/01/19 1026 05/08/19 1022 05/10/19 0847 05/18/19 1413       Exercise Goal Re-Evaluation   Exercise Goals Review  Increase Physical Activity;Increase Strength and Stamina;Understanding of Exercise Prescription  Increase Physical Activity;Increase Strength and Stamina;Understanding of Exercise Prescription;Able to understand and use rate of perceived exertion (RPE) scale;Knowledge and understanding of Target Heart Rate Range (THRR);Able to check pulse independently  Understanding of Exercise Prescription;Increase Strength and Stamina;Increase Physical Activity  Increase Physical Activity;Increase Strength and Stamina;Able to understand and use rate of perceived exertion (RPE) scale;Knowledge and understanding of Target Heart Rate Range (THRR);Able to check pulse independently;Understanding of Exercise Prescription    Comments  Reviewed RPE scale, THR and program prescription with pt today.  Pt voiced understanding and was given a copy of goals to take home.  Reviewed home exercise with pt today.  Pt plans to walk and do yard work for exercise.  Reviewed THR, pulse, RPE, sign and symptoms, NTG use, and when to call 911 or MD.  Also discussed weather considerations and indoor options.  Pt voiced understanding.  Patient states that he will walk when he is  done with rehab. Patient has a pulse ox to chech is heart rate and oxygen. John will have home exercise when he has had a little more time to get use to rehab. Explained to patient why it is important to continue exercising when he is done with the program. He wants to be able to jog more.  Jon tolerates exercise well and worked at around 4 METS.  Staff will work with him on getting back to jogging    Expected Outcomes  Short: Use RPE daily to regulate intensity. Long: Follow program prescription in THR.  Short: add 1-2 days of walking at home following home exercise guidlines. Long: Become indepenedent with exercise program.  Short: increase strength and stamina. Long: be able to jog.  Short - attend consistently Long - incorporate joggging into program       Discharge Exercise Prescription (Final Exercise Prescription Changes): Exercise Prescription Changes - 05/18/19 1400      Response to Exercise   Blood Pressure (Admit)  122/64    Blood Pressure (Exercise)  134/64    Blood Pressure (Exit)  96/56    Heart Rate (Admit)  83 bpm    Heart Rate (Exercise)  117 bpm    Heart Rate (Exit)  82 bpm    Symptoms  none    Duration  Continue with 30 min of aerobic exercise without  signs/symptoms of physical distress.    Intensity  THRR unchanged      Progression   Progression  Continue to progress workloads to maintain intensity without signs/symptoms of physical distress.    Average METs  4.05      Resistance Training   Training Prescription  Yes    Weight  0   ROM only due to shoulder problem   Reps  10-15      Treadmill   MPH  3.5    Grade  1    Minutes  15    METs  4.16      T5 Nustep   Level  7    SPM  80    Minutes  15    METs  4       Nutrition:  Target Goals: Understanding of nutrition guidelines, daily intake of sodium '1500mg'$ , cholesterol '200mg'$ , calories 30% from fat and 7% or less from saturated fats, daily to have 5 or more servings of fruits and  vegetables.  Biometrics: Pre Biometrics - 05/01/19 1021      Pre Biometrics   Height  6' 1.5" (1.867 m)    Weight  183 lb 3.2 oz (83.1 kg)    BMI (Calculated)  23.84    Single Leg Stand  30 seconds        Nutrition Therapy Plan and Nutrition Goals: Nutrition Therapy & Goals - 04/18/19 1209      Nutrition Therapy   Diet  low Na, HH diet    Drug/Food Interactions  Coumadin/Vit K    Protein (specify units)  65g    Fiber  30 grams    Whole Grain Foods  3 servings    Saturated Fats  12 max. grams    Fruits and Vegetables  5 servings/day    Sodium  1.5 grams      Personal Nutrition Goals   Nutrition Goal  ST: eat salads 2x/week LT: feel better    Comments  Pt reports working all day doing chores. Pt reports that he sometimes does eat breakfast, but if he does he will have a bagel and cream cheese or ham biscuit. L and D is usually chicken and a starch, pt reports liking vegetables. Dessert is usually some chocolate and ~10 vanilla wafers with peanut butter. Discussed HH eating. Pt reports he thinks his diet is ok but he should eat more salad. Discussed consistency and vitaminK and medication.      Intervention Plan   Intervention  Prescribe, educate and counsel regarding individualized specific dietary modifications aiming towards targeted core components such as weight, hypertension, lipid management, diabetes, heart failure and other comorbidities.;Nutrition handout(s) given to patient.    Expected Outcomes  Short Term Goal: Understand basic principles of dietary content, such as calories, fat, sodium, cholesterol and nutrients.;Short Term Goal: A plan has been developed with personal nutrition goals set during dietitian appointment.;Long Term Goal: Adherence to prescribed nutrition plan.       Nutrition Assessments: Nutrition Assessments - 05/01/19 1023      MEDFICTS Scores   Pre Score  38       Nutrition Goals Re-Evaluation: Nutrition Goals Re-Evaluation    Row Name  05/22/19 1024             Goals   Nutrition Goal  ST: eat salads 2x/week LT: feel better       Comment  Continue with current changes       Expected Outcome  ST: eat salads  2x/week LT: feel better          Nutrition Goals Discharge (Final Nutrition Goals Re-Evaluation): Nutrition Goals Re-Evaluation - 05/22/19 1024      Goals   Nutrition Goal  ST: eat salads 2x/week LT: feel better    Comment  Continue with current changes    Expected Outcome  ST: eat salads 2x/week LT: feel better       Psychosocial: Target Goals: Acknowledge presence or absence of significant depression and/or stress, maximize coping skills, provide positive support system. Participant is able to verbalize types and ability to use techniques and skills needed for reducing stress and depression.   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 He is hoping being here will help him get stronger and increase his stamina      Family Dynamics   Good Support System?  Yes      Barriers   Psychosocial barriers to participate in program  There are no identifiable barriers or psychosocial needs.;The patient should benefit from training in stress management and relaxation.      Screening Interventions   Interventions  Encouraged to exercise;Program counselor consult;To provide support and resources with identified psychosocial needs    Expected Outcomes  Short Term goal: Utilizing psychosocial counselor, staff and physician to assist with identification of specific Stressors or current issues interfering with healing process. Setting desired goal for each stressor or current issue identified.;Long Term Goal: Stressors or current issues are controlled or  eliminated.;Short Term goal: Identification and review with participant of any Quality of Life or Depression concerns found by scoring the questionnaire.;Long Term goal: The participant improves quality of Life and PHQ9 Scores as seen by post scores and/or verbalization of changes       Quality of Life Scores:  Quality of Life - 05/01/19 1022      Quality of Life   Select  Quality of Life      Quality of Life Scores   Health/Function Pre  23.53 %    Socioeconomic Pre  24.42 %    Psych/Spiritual Pre  27 %    Family Pre  27.1 %    GLOBAL Pre  24.97 %      Scores of 19 and below usually indicate a poorer quality of life in these areas.  A difference of  2-3 points is a clinically meaningful difference.  A difference of 2-3 points in the total score of the Quality of Life Index has been associated with significant improvement in overall quality of life, self-image, physical symptoms, and general health in studies assessing change in quality of life.  PHQ-9: Recent Review Flowsheet Data    Depression screen [facility] 2/9 05/01/2019   Decreased Interest 0   Down, Depressed, Hopeless 0   PHQ - 2 Score 0   Altered sleeping 0   Tired, decreased energy 1   Change in appetite 1   Feeling bad or failure about yourself  0   Trouble concentrating 0   Moving slowly or fidgety/restless 0   Suicidal thoughts 0   PHQ-9 Score 2   Difficult doing work/chores Somewhat difficult     Interpretation of Total Score  Total Score Depression  Severity:  1-4 = Minimal depression, 5-9 = Mild depression, 10-14 = Moderate depression, 15-19 = Moderately severe depression, 20-27 = Severe depression   Psychosocial Evaluation and Intervention: Psychosocial Evaluation - 04/17/19 1416      Psychosocial Evaluation & Interventions   Comments  Feels behind of doing things around the house and 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 He is hoping being here will help him get  stronger and increase his stamina    Expected Outcomes  Short: attend [facility] on consistent basis to achieve goals. Long: develop self care habits    Continue Psychosocial Services   Follow up required by staff       Psychosocial Re-Evaluation: Psychosocial Re-Evaluation    [place] Name 05/10/19 [phone]             Psychosocial Re-Evaluation   Current issues with  Current Stress Concerns       Comments  Patient wants to get his health back on track. He wants to do things like remodel and downsize but feels like he does not have enough time left. He feels like he is not checking off things off his bucket list. Jenny Reichmann is retired and finding himself very busy and not enough time to do things around the house.       Expected Outcomes  Short: continue to exercise to reduce stress. Long: maintain exercise to keep stress at a minimum.       Interventions  Encouraged to attend Cardiac Rehabilitation for the exercise       Continue Psychosocial Services   Follow up required by staff          Psychosocial Discharge (Final Psychosocial Re-Evaluation): Psychosocial Re-Evaluation - 05/10/19 0853      Psychosocial Re-Evaluation   Current issues with  Current Stress Concerns    Comments  Patient wants to get his health back on track. He wants to do things like remodel and downsize but feels like he does not have enough time left. He feels like he is not checking off things off his bucket list. Jenny Reichmann is retired and finding himself very busy and not enough time to do things around the house.    Expected Outcomes  Short: continue to exercise to reduce stress. Long: maintain exercise to keep stress at a minimum.    Interventions  Encouraged to attend Cardiac Rehabilitation for the exercise    Continue Psychosocial Services   Follow up required by staff       Vocational Rehabilitation: Provide vocational rehab assistance to qualifying candidates.   Vocational Rehab Evaluation & Intervention: Vocational  Rehab - 04/17/19 1406      Initial Vocational Rehab Evaluation & Intervention   Assessment shows need for Vocational Rehabilitation  No       Education: Education Goals: Education classes will be provided on a variety of topics geared toward better understanding of heart health and risk factor modification. Participant will state understanding/return demonstration of topics presented as noted by education test scores.  Learning Barriers/Preferences: Learning Barriers/Preferences - 04/17/19 1406      Learning Barriers/Preferences   Learning Barriers  None    Learning Preferences  Individual Instruction       Education Topics:  AED/CPR: - Group verbal and written instruction with the use of models to demonstrate the basic use of the AED with the basic ABC's of resuscitation.   General Nutrition Guidelines/Fats and Fiber: -Group instruction provided by  verbal, written material, models and posters to present the general guidelines for heart healthy nutrition. Gives an explanation and review of dietary fats and fiber.   Controlling Sodium/Reading Food Labels: -Group verbal and written material supporting the discussion of sodium use in heart healthy nutrition. Review and explanation with models, verbal and written materials for utilization of the food label.   Exercise Physiology & General Exercise Guidelines: - Group verbal and written instruction with models to review the exercise physiology of the cardiovascular system and associated critical values. Provides general exercise guidelines with specific guidelines to those with heart or lung disease.    Aerobic Exercise & Resistance Training: - Gives group verbal and written instruction on the various components of exercise. Focuses on aerobic and resistive training programs and the benefits of this training and how to safely progress through these programs..   Flexibility, Balance, Mind/Body Relaxation: Provides group  verbal/written instruction on the benefits of flexibility and balance training, including mind/body exercise modes such as yoga, pilates and tai chi.  Demonstration and skill practice provided.   Stress and Anxiety: - Provides group verbal and written instruction about the health risks of elevated stress and causes of high stress.  Discuss the correlation between heart/lung disease and anxiety and treatment options. Review healthy ways to manage with stress and anxiety.   Depression: - Provides group verbal and written instruction on the correlation between heart/lung disease and depressed mood, treatment options, and the stigmas associated with seeking treatment.   Anatomy & Physiology of the Heart: - Group verbal and written instruction and models provide basic cardiac anatomy and physiology, with the coronary electrical and arterial systems. Review of Valvular disease and Heart Failure   Cardiac Procedures: - Group verbal and written instruction to review commonly prescribed medications for heart disease. Reviews the medication, class of the drug, and side effects. Includes the steps to properly store meds and maintain the prescription regimen. (beta blockers and nitrates)   Cardiac Medications I: - Group verbal and written instruction to review commonly prescribed medications for heart disease. Reviews the medication, class of the drug, and side effects. Includes the steps to properly store meds and maintain the prescription regimen.   Cardiac Medications II: -Group verbal and written instruction to review commonly prescribed medications for heart disease. Reviews the medication, class of the drug, and side effects. (all other drug classes)    Go Sex-Intimacy & Heart Disease, Get SMART - Goal Setting: - Group verbal and written instruction through game format to discuss heart disease and the return to sexual intimacy. Provides group verbal and written material to discuss and apply  goal setting through the application of the S.M.A.R.T. Method.   Other Matters of the Heart: - Provides group verbal, written materials and models to describe Stable Angina and Peripheral Artery. Includes description of the disease process and treatment options available to the cardiac patient.   Exercise & Equipment Safety: - Individual verbal instruction and demonstration of equipment use and safety with use of the equipment.   Infection Prevention: - Provides verbal and written material to individual with discussion of infection control including proper hand washing and proper equipment cleaning during exercise session.   Falls Prevention: - Provides verbal and written material to individual with discussion of falls prevention and safety.   Diabetes: - Individual verbal and written instruction to review signs/symptoms of diabetes, desired ranges of glucose level fasting, after meals and with exercise. Acknowledge that pre and post exercise glucose checks will be  done for 3 sessions at entry of program.   Know Your Numbers and Risk Factors: -Group verbal and written instruction about important numbers in your health.  Discussion of what are risk factors and how they play a role in the disease process.  Review of Cholesterol, Blood Pressure, Diabetes, and BMI and the role they play in your overall health.   Sleep Hygiene: -Provides group verbal and written instruction about how sleep can affect your health.  Define sleep hygiene, discuss sleep cycles and impact of sleep habits. Review good sleep hygiene tips.    Other: -Provides group and verbal instruction on various topics (see comments)   Knowledge Questionnaire Score: Knowledge Questionnaire Score - 05/01/19 1023      Knowledge Questionnaire Score   Pre Score  21/26 education focus: A&P, PAD, Nutrition, Exercise       Core Components/Risk Factors/Patient Goals at Admission: Personal Goals and Risk Factors at Admission  - 05/01/19 1024      Core Components/Risk Factors/Patient Goals on Admission    Weight Management  Yes;Weight Maintenance    Intervention  Weight Management: Develop a combined nutrition and exercise program designed to reach desired caloric intake, while maintaining appropriate intake of nutrient and fiber, sodium and fats, and appropriate energy expenditure required for the weight goal.;Weight Management: Provide education and appropriate resources to help participant work on and attain dietary goals.    Admit Weight  183 lb 3.2 oz (83.1 kg)    Goal Weight: Short Term  183 lb (83 kg)    Goal Weight: Long Term  183 lb (83 kg)    Expected Outcomes  Short Term: Continue to assess and modify interventions until short term weight is achieved;Long Term: Adherence to nutrition and physical activity/exercise program aimed toward attainment of established weight goal;Weight Maintenance: Understanding of the daily nutrition guidelines, which includes 25-35% calories from fat, 7% or less cal from saturated fats, less than '200mg'$  cholesterol, less than 1.5gm of sodium, & 5 or more servings of fruits and vegetables daily    Number of packs per day  1 pack/ day    Intervention  Assist the participant in steps to quit. Provide individualized education and counseling about committing to Tobacco Cessation, relapse prevention, and pharmacological support that can be provided by physician.;Advice worker, assist with locating and accessing local/national Quit Smoking programs, and support quit date choice.    Expected Outcomes  Short Term: Will demonstrate readiness to quit, by selecting a quit date.;Short Term: Will quit all tobacco product use, adhering to prevention of relapse plan.;Long Term: Complete abstinence from all tobacco products for at least 12 months from quit date.    Lipids  Yes    Intervention  Provide education and support for participant on nutrition & aerobic/resistive exercise along  with prescribed medications to achieve LDL '70mg'$ , HDL >'40mg'$ .    Expected Outcomes  Short Term: Participant states understanding of desired cholesterol values and is compliant with medications prescribed. Participant is following exercise prescription and nutrition guidelines.;Long Term: Cholesterol controlled with medications as prescribed, with individualized exercise RX and with personalized nutrition plan. Value goals: LDL < '70mg'$ , HDL > 40 mg.       Core Components/Risk Factors/Patient Goals Review:  Goals and Risk Factor Review    Row Name 05/10/19 0858             Core Components/Risk Factors/Patient Goals Review   Personal Goals Review  Weight Management/Obesity;Improve shortness of breath with ADL's;Tobacco Cessation;Lipids  Review  John smokes a pack a day and does not have any interest in quitting. He is managing his weight and staying around 183 pounds. His lipids are doing well and he is taking medication for it. He has labs done recently and he states it was below 39.       Expected Outcomes  Short: continue to exercise to improve ADL'S. Long: maintain ADL independently.          Core Components/Risk Factors/Patient Goals at Discharge (Final Review):  Goals and Risk Factor Review - 05/10/19 0858      Core Components/Risk Factors/Patient Goals Review   Personal Goals Review  Weight Management/Obesity;Improve shortness of breath with ADL's;Tobacco Cessation;Lipids    Review  John smokes a pack a day and does not have any interest in quitting. He is managing his weight and staying around 183 pounds. His lipids are doing well and he is taking medication for it. He has labs done recently and he states it was below 52.    Expected Outcomes  Short: continue to exercise to improve ADL'S. Long: maintain ADL independently.       ITP Comments: ITP Comments    Row Name 04/17/19 1401 04/18/19 1211 04/18/19 1311 04/26/19 1112 05/01/19 0947   ITP Comments  Virtual Initial Oriention  completed. Diagnosis can be found in [facility] 8/23. EP/RD orientation scheduled fro 9/29 at 11 am  completed RD initial eval  When patient arrived today patient reported to Nada Maclachlan, EP, that he had not taken his Plavix in 3 days due to being out of the medication.  The reason reported was he had not yet received his medications in the mail from the [place].  Patient reported this is the only medication he has missed.  Patient no longer taking baby aspirin as he was advised to stop Aspirin after one month.  He is also on Coumadin due to afib / flutter.  Sent secure chat message to Ronda Fairly, RN and Ignacia Bayley, NP at [facility] to request Plavix prescription be called in to [facility] in [place].  Patient has [facility] in addition to the [place]. Verlon Au, RN responded on [employer] letting me know she had called the prescription in.  Patient instructed to go pick up Plavix prescription and take medication today.  Note:  Patient did not exercise today.     Patient will return on Monday, April 24, 2019 to start Cardiac Rehab.  30 day review completed. ITP sent to [name], Medical Director of Cardiac and Pulmonary Rehab. Continue with ITP unless changes are made by physician.  Department closed starting 10/2 until further notice by infection prevention and Health at Work teams for [place].  First full day of exercise!  Patient was oriented to gym and equipment including functions, settings, policies, and procedures.  Patient's individual exercise prescription and treatment plan were reviewed.  All starting workloads were established based on the results of the 6 minute walk test done at initial orientation visit.  The plan for exercise progression was also introduced and progression will be customized based on patient's performance and goals.   [place] Name 05/24/19 0637           ITP Comments  30 day review completed. Continue with ITP sent to [name], Medical Director of  Cardiac and Pulmonary Rehab for review , changes as needed and signature.          Comments:

## 2019-05-26 ENCOUNTER — Other Ambulatory Visit: Payer: Self-pay

## 2019-05-26 NOTE — Telephone Encounter (Signed)
Patient called [facility] for refills on all cardiac medicines including Warfarin, would like them sent to the [place]. Saw Ignacia Bayley in October.

## 2019-05-26 NOTE — Telephone Encounter (Signed)
Refill Request.  

## 2019-05-29 MED ORDER — WARFARIN SODIUM 5 MG PO TABS: ORAL_TABLET | ORAL | 0 refills | Status: DC

## 2019-05-29 NOTE — Telephone Encounter (Signed)
Warfarin refill sent in as requested. °

## 2019-05-30 ENCOUNTER — Telehealth: Payer: Self-pay | Admitting: Nurse Practitioner

## 2019-05-30 MED ORDER — WARFARIN SODIUM 5 MG PO TABS: ORAL_TABLET | ORAL | 0 refills | Status: DC

## 2019-05-30 NOTE — Telephone Encounter (Signed)
Brian Mcclure sent rx refill to [facility] 05/29/19 #45 tablets with no refills, receipt confirmed by pharmacy at 9:08am.  [facility]ill a [facility]. Pt takes 5mg  tablet, 1/2 tablet daily.  Will send in rx for 15 tablets.

## 2019-05-30 NOTE — Telephone Encounter (Signed)
° °*  STAT* If patient is at the pharmacy, call can be transferred to refill team.   1. Which medications need to be refilled? (please list name of each medication and dose if known)  warfarin (COUMADIN) 5 MG tablet  2. Which pharmacy/location (including street and city if local pharmacy) is medication to be sent to? [place], [place]   3. Do they need a 30 day or 90 day supply? 30  Patient is out of medication. He has a new rx sent to the [place] , but the [place] it to him and he will not have it until the end of the week.

## 2019-06-07 ENCOUNTER — Other Ambulatory Visit: Payer: Self-pay | Admitting: Cardiology

## 2019-06-07 MED ORDER — ROSUVASTATIN CALCIUM 40 MG PO TABS: 40.0000 mg | ORAL_TABLET | Freq: Every day | ORAL | 1 refills | Status: DC

## 2019-06-07 MED ORDER — LOSARTAN POTASSIUM 25 MG PO TABS: 12.5000 mg | ORAL_TABLET | Freq: Every day | ORAL | 1 refills | Status: AC

## 2019-06-07 MED ORDER — CLOPIDOGREL BISULFATE 75 MG PO TABS: 75.0000 mg | ORAL_TABLET | Freq: Every day | ORAL | 1 refills | Status: DC

## 2019-06-07 NOTE — Telephone Encounter (Signed)
Requested Prescriptions   Signed Prescriptions Disp Refills  . rosuvastatin (CRESTOR) 40 MG tablet 30 tablet 1    Sig: Take 1 tablet (40 mg total) by mouth at bedtime.    Authorizing Provider: Theora Gianotti    Ordering User: NEWCOMER MCCLAIN, BRANDY L  . clopidogrel (PLAVIX) 75 MG tablet 30 tablet 1    Sig: Take 1 tablet (75 mg total) by mouth daily.    Authorizing Provider: Theora Gianotti    Ordering User: NEWCOMER MCCLAIN, BRANDY L  . losartan (COZAAR) 25 MG tablet 15 tablet 1    Sig: Take 0.5 tablets (12.5 mg total) by mouth daily.    Authorizing Provider: Theora Gianotti    Ordering User: Raelene Bott, BRANDY L

## 2019-06-07 NOTE — Telephone Encounter (Signed)
°*  STAT* If patient is at the pharmacy, call can be transferred to refill team.   1. Which medications need to be refilled? (please list name of each medication and dose if known)  rosuvastatin (CRESTOR) 40 MG  Clopidogrel (PLAVIX) 75 MG Losartan (COZAAR) 25 MG   2. Which pharmacy/location (including street and city if local pharmacy) is medication to be sent to? [place] - mail in   3. Do they need a 30 day or 90 day supply? 30 day

## 2019-06-12 ENCOUNTER — Other Ambulatory Visit: Payer: Self-pay

## 2019-06-12 ENCOUNTER — Encounter: Payer: No Typology Code available for payment source | Admitting: *Deleted

## 2019-06-12 ENCOUNTER — Other Ambulatory Visit: Payer: Self-pay | Admitting: Nurse Practitioner

## 2019-06-12 DIAGNOSIS — I213 ST elevation (STEMI) myocardial infarction of unspecified site: Secondary | ICD-10-CM | POA: Diagnosis not present

## 2019-06-12 DIAGNOSIS — R0600 Dyspnea, unspecified: Secondary | ICD-10-CM

## 2019-06-12 DIAGNOSIS — I2510 Atherosclerotic heart disease of native coronary artery without angina pectoris: Secondary | ICD-10-CM

## 2019-06-12 NOTE — Progress Notes (Signed)
Daily Session Note  Patient Details  Name: Brian Mcclure MRN: [id number] Date of Birth: 03-04-1956 Referring Provider:     Cardiac Rehab from 05/01/2019 in [facility] Cardiac and Pulmonary Rehab  Referring Provider  Lynda Rainwater, MD      Encounter Date: 06/12/2019  Check In: Session Check In - 06/12/19 4734      Check-In   Supervising physician immediately available to respond to emergencies  See telemetry face sheet for immediately available ER MD    Location  [facility]-Cardiac & Pulmonary Rehab    Staff Present  Heath Lark, RN, BSN, CCRP;Jessica Hartford, MA, RCEP, CCRP, [place], BS, ACSM CEP, Exercise Physiologist    Virtual Visit  No    Medication changes reported      No    Fall or balance concerns reported     No    Warm-up and Cool-down  Performed on first and last piece of equipment    Resistance Training Performed  Yes    VAD Patient?  No    PAD/SET Patient?  No      Pain Assessment   Currently in Pain?  No/denies          Social History   Tobacco Use  Smoking Status Current Every Day Smoker  . Packs/day: 1.00  . Years: 40.00  . Pack years: 40.00  . Types: Cigarettes  Smokeless Tobacco Never Used    Goals Met:  Independence with exercise equipment Exercise tolerated well Personal goals reviewed No report of cardiac concerns or symptoms  Goals Unmet:  Not Applicable  Comments: Pt able to follow exercise prescription today without complaint.  Will continue to monitor for progression.    [name] is Medical Director for [place] and [name].

## 2019-06-14 ENCOUNTER — Other Ambulatory Visit: Payer: Self-pay

## 2019-06-14 ENCOUNTER — Ambulatory Visit (INDEPENDENT_AMBULATORY_CARE_PROVIDER_SITE_OTHER): Payer: No Typology Code available for payment source

## 2019-06-14 ENCOUNTER — Encounter: Payer: No Typology Code available for payment source | Admitting: *Deleted

## 2019-06-14 DIAGNOSIS — I480 Paroxysmal atrial fibrillation: Secondary | ICD-10-CM | POA: Diagnosis not present

## 2019-06-14 DIAGNOSIS — I236 Thrombosis of atrium, auricular appendage, and ventricle as current complications following acute myocardial infarction: Secondary | ICD-10-CM

## 2019-06-14 DIAGNOSIS — I213 ST elevation (STEMI) myocardial infarction of unspecified site: Secondary | ICD-10-CM | POA: Diagnosis not present

## 2019-06-14 DIAGNOSIS — Z5181 Encounter for therapeutic drug level monitoring: Secondary | ICD-10-CM | POA: Diagnosis not present

## 2019-06-14 LAB — POCT INR: INR: 2 (ref 2.0–3.0)

## 2019-06-14 NOTE — Patient Instructions (Signed)
Please continue dosage of warfarin 2.5 mg every day. Recheck in 4 weeks.

## 2019-06-14 NOTE — Progress Notes (Signed)
Daily Session Note  Patient Details  Name: Brian Mcclure MRN: [id number] Date of Birth: Feb 11, 1956 Referring Provider:     Cardiac Rehab from 05/01/2019 in [facility] Cardiac and Pulmonary Rehab  Referring Provider  Lynda Rainwater, MD      Encounter Date: 06/14/2019  Check In: Session Check In - 06/14/19 0933      Check-In   Supervising physician immediately available to respond to emergencies  See telemetry face sheet for immediately available ER MD    Location  [facility]-Cardiac & Pulmonary Rehab    Staff Present  Heath Lark, RN, BSN, CCRP;Jeanna Durrell BS, Exercise Physiologist;Amanda Oletta Darter, BA, ACSM CEP, Exercise Physiologist    Virtual Visit  No    Medication changes reported      No    Fall or balance concerns reported     No    Warm-up and Cool-down  Performed on first and last piece of equipment    Resistance Training Performed  Yes    VAD Patient?  No    PAD/SET Patient?  No      Pain Assessment   Currently in Pain?  No/denies          Social History   Tobacco Use  Smoking Status Current Every Day Smoker  . Packs/day: 1.00  . Years: 40.00  . Pack years: 40.00  . Types: Cigarettes  Smokeless Tobacco Never Used    Goals Met:  Independence with exercise equipment Exercise tolerated well No report of cardiac concerns or symptoms  Goals Unmet:  Not Applicable  Comments: Pt able to follow exercise prescription today without complaint.  Will continue to monitor for progression.    [name] is Medical Director for [place] and [name].

## 2019-06-16 ENCOUNTER — Other Ambulatory Visit: Payer: No Typology Code available for payment source

## 2019-06-20 ENCOUNTER — Encounter: Payer: No Typology Code available for payment source | Attending: Internal Medicine | Admitting: *Deleted

## 2019-06-20 ENCOUNTER — Other Ambulatory Visit: Payer: Self-pay

## 2019-06-20 DIAGNOSIS — I213 ST elevation (STEMI) myocardial infarction of unspecified site: Secondary | ICD-10-CM | POA: Diagnosis present

## 2019-06-20 NOTE — Progress Notes (Signed)
Daily Session Note  Patient Details  Name: Brian Mcclure MRN: [id number] Date of Birth: April 21, 1956 Referring Provider:     Cardiac Rehab from 05/01/2019 in [facility] Cardiac and Pulmonary Rehab  Referring Provider  Lynda Rainwater, MD      Encounter Date: 06/20/2019  Check In: Session Check In - 06/20/19 1125      Check-In   Supervising physician immediately available to respond to emergencies  See telemetry face sheet for immediately available ER MD    Location  [facility]-Cardiac & Pulmonary Rehab    Staff Present  Heath Lark, RN, BSN, CCRP;Joseph Hood RCP,RRT,BSRT;Amanda Oletta Darter, [place], ACSM CEP, Exercise Physiologist    Virtual Visit  No    Medication changes reported      No    Fall or balance concerns reported     No    Warm-up and Cool-down  Performed on first and last piece of equipment    Resistance Training Performed  Yes    VAD Patient?  No    PAD/SET Patient?  No      Pain Assessment   Currently in Pain?  No/denies          Social History   Tobacco Use  Smoking Status Current Every Day Smoker  . Packs/day: 1.00  . Years: 40.00  . Pack years: 40.00  . Types: Cigarettes  Smokeless Tobacco Never Used    Goals Met:  Independence with exercise equipment Exercise tolerated well No report of cardiac concerns or symptoms  Goals Unmet:  Not Applicable  Comments: Pt able to follow exercise prescription today without complaint.  Will continue to monitor for progression.    [name] is Medical Director for [place] and [name].

## 2019-06-21 ENCOUNTER — Ambulatory Visit (INDEPENDENT_AMBULATORY_CARE_PROVIDER_SITE_OTHER): Payer: No Typology Code available for payment source

## 2019-06-21 ENCOUNTER — Encounter: Payer: No Typology Code available for payment source | Admitting: *Deleted

## 2019-06-21 DIAGNOSIS — I213 ST elevation (STEMI) myocardial infarction of unspecified site: Secondary | ICD-10-CM | POA: Diagnosis not present

## 2019-06-21 DIAGNOSIS — R0600 Dyspnea, unspecified: Secondary | ICD-10-CM

## 2019-06-21 DIAGNOSIS — I2510 Atherosclerotic heart disease of native coronary artery without angina pectoris: Secondary | ICD-10-CM

## 2019-06-21 MED ORDER — PERFLUTREN LIPID MICROSPHERE
1.0000 mL | INTRAVENOUS | Status: AC | PRN
  Administered 2019-06-21: 3 mL via INTRAVENOUS

## 2019-06-21 NOTE — Progress Notes (Signed)
Daily Session Note  Patient Details  Name: Brian Mcclure MRN: [id number] Date of Birth: 1956/06/29 Referring Provider:     Cardiac Rehab from 05/01/2019 in [facility] Cardiac and Pulmonary Rehab  Referring Provider  Lynda Rainwater, MD      Encounter Date: 06/21/2019  Check In: Session Check In - 06/21/19 1433      Check-In   Supervising physician immediately available to respond to emergencies  See telemetry face sheet for immediately available ER MD    Location  [facility]-Cardiac & Pulmonary Rehab    Staff Present  Renita Papa, RN Vickki Hearing, BA, ACSM CEP, Exercise Physiologist;Melissa Caiola RDN, LDN;Joseph Hood RCP,RRT,BSRT    Virtual Visit  No    Medication changes reported      No    Fall or balance concerns reported     No    Warm-up and Cool-down  Performed on first and last piece of equipment    Resistance Training Performed  Yes    VAD Patient?  No    PAD/SET Patient?  No      Pain Assessment   Currently in Pain?  No/denies          Social History   Tobacco Use  Smoking Status Current Every Day Smoker  . Packs/day: 1.00  . Years: 40.00  . Pack years: 40.00  . Types: Cigarettes  Smokeless Tobacco Never Used    Goals Met:  Independence with exercise equipment Exercise tolerated well No report of cardiac concerns or symptoms Strength training completed today  Goals Unmet:  Not Applicable  Comments: Pt able to follow exercise prescription today without complaint.  Will continue to monitor for progression.    [name] is Medical Director for [place] and [name].

## 2019-06-22 ENCOUNTER — Ambulatory Visit: Payer: No Typology Code available for payment source

## 2019-06-23 ENCOUNTER — Telehealth: Payer: Self-pay

## 2019-06-23 NOTE — Telephone Encounter (Signed)
Call to patient to discuss results of echo.  No further orders at this time.   follow up appt is Monday.  Advised pt to call for any further questions or concerns.

## 2019-06-23 NOTE — Telephone Encounter (Signed)
-----   Message from Theora Gianotti, NP sent at 06/22/2019  5:42 PM EST ----- Heart squeezing function has improved over the past few months - now 50-55%, which is low-normal, up from 40-45%.  This is great news.  The heart is mildly stiff, so continued attention to HR and BP is appropriate (both were fine in Oct).  No evidence of LV thrombus.  He is to remain on coumadin given h/o afib/flutter.  Mildly leaky tricuspid and mitral valves - not significant.  Overall - a reassuring study!

## 2019-06-26 ENCOUNTER — Ambulatory Visit (INDEPENDENT_AMBULATORY_CARE_PROVIDER_SITE_OTHER): Payer: No Typology Code available for payment source | Admitting: Cardiology

## 2019-06-26 ENCOUNTER — Encounter: Payer: Self-pay | Admitting: Cardiology

## 2019-06-26 ENCOUNTER — Other Ambulatory Visit: Payer: Self-pay

## 2019-06-26 ENCOUNTER — Encounter: Payer: No Typology Code available for payment source | Admitting: *Deleted

## 2019-06-26 VITALS — BP 114/60 | HR 66 | Ht 75.0 in | Wt 183.1 lb

## 2019-06-26 DIAGNOSIS — I5020 Unspecified systolic (congestive) heart failure: Secondary | ICD-10-CM

## 2019-06-26 DIAGNOSIS — Z720 Tobacco use: Secondary | ICD-10-CM | POA: Diagnosis not present

## 2019-06-26 DIAGNOSIS — I739 Peripheral vascular disease, unspecified: Secondary | ICD-10-CM | POA: Diagnosis not present

## 2019-06-26 DIAGNOSIS — I2510 Atherosclerotic heart disease of native coronary artery without angina pectoris: Secondary | ICD-10-CM

## 2019-06-26 DIAGNOSIS — I480 Paroxysmal atrial fibrillation: Secondary | ICD-10-CM

## 2019-06-26 DIAGNOSIS — I213 ST elevation (STEMI) myocardial infarction of unspecified site: Secondary | ICD-10-CM

## 2019-06-26 NOTE — Progress Notes (Signed)
Daily Session Note  Patient Details  Name: Brian Mcclure MRN: [id number] Date of Birth: 10-28-1955 Referring Provider:     Cardiac Rehab from 05/01/2019 in [facility] Cardiac and Pulmonary Rehab  Referring Provider  Lynda Rainwater, MD      Encounter Date: 06/26/2019  Check In: Session Check In - 06/26/19 0954      Check-In   Supervising physician immediately available to respond to emergencies  See telemetry face sheet for immediately available ER MD    Location  [facility]-Cardiac & Pulmonary Rehab    Staff Present  Heath Lark, RN, BSN, [place]aveda Norman, BS, ACSM CEP, Exercise Physiologist;Joseph Tessie Fass RCP,RRT,BSRT    Virtual Visit  No    Medication changes reported      No    Fall or balance concerns reported     No    Warm-up and Cool-down  Performed on first and last piece of equipment    Resistance Training Performed  Yes    VAD Patient?  No    PAD/SET Patient?  No      Pain Assessment   Currently in Pain?  No/denies          Social History   Tobacco Use  Smoking Status Current Every Day Smoker  . Packs/day: 1.00  . Years: 40.00  . Pack years: 40.00  . Types: Cigarettes  Smokeless Tobacco Never Used    Goals Met:  Independence with exercise equipment Exercise tolerated well No report of cardiac concerns or symptoms  Goals Unmet:  Not Applicable  Comments: Pt able to follow exercise prescription today without complaint.  Will continue to monitor for progression.    [name] is Medical Director for [place] and [name].

## 2019-06-26 NOTE — Patient Instructions (Addendum)
Medication Instructions:  Your physician recommends that you continue on your current medications as directed. Please refer to the Current Medication list given to you today.  *If you need a refill on your cardiac medications before your next appointment, please call your pharmacy*  Lab Work: None ordered If you have labs (blood work) drawn today and your tests are completely normal, you will receive your results only by: Marland Kitchen MyChart Message (if you have MyChart) OR . A paper copy in the mail If you have any lab test that is abnormal or we need to change your treatment, we will call you to review the results.  Testing/Procedures: None ordered  Follow-Up: At [facility], you and your health needs are our priority.  As part of our continuing mission to provide you with exceptional heart care, we have created designated Provider Care Teams.  These Care Teams include your primary Cardiologist (physician) and Advanced Practice Providers (APPs -  Physician Assistants and Nurse Practitioners) who all work together to provide you with the care you need, when you need it.  Your next appointment:   6 month(s)  The format for your next appointment:   In Person  Provider:    You may see Kate Sable, MD or one of the following Advanced Practice Providers on your designated Care Team:    Murray Hodgkins, NP  Christell Faith, PA-C  Marrianne Mood, PA-C   Other Instructions N/A

## 2019-06-26 NOTE — Progress Notes (Signed)
Cardiology Office Note:    Date:  06/26/2019   ID:  Brian, Mcclure 23-Mar-1956, MRN [id number]  PCP:  [facility] [place]  Cardiologist:  Kate Sable, MD  Electrophysiologist:  None   Referring MD: [place]   Chief Complaint  Patient presents with  . Other    follow up post ECHO. Med reviewed verbally with patient.     History of Present Illness:    Brian Mcclure is a 63 y.o. male with a hx of CAD/MI status post stent to the LAD in August 2020, heart failure reduced ejection fraction, LV apical thrombus on Coumadin, paroxysmal A. fib, PAD status post aorto iliac stents, COPD, alcohol abuse who presents for follow-up.  Patient had an anterior ST elevated MI in August 2020.  He has done well since and participating in cardiac rehab without any symptoms limitations.  He takes Coumadin for history of LV thrombus.  He had his stents to the iliacs bilaterally placed at the [place].  He also sees EP at the [place].  Echocardiogram was performed to evaluate LV thrombus.  Patient still smokes.  [place]  He is concerned about risk of bleeding and also lack of/all readily available FDA approved reversal agents for NOACs if he is to switch from Coumadin.  He feels fine, knows when he goes into atrial fibrillation, has occasional lower extremity calf pain.  Past Medical History:  Diagnosis Date  . Actinic keratosis   . Acute ST elevation myocardial infarction (STEMI) of anterior wall ([place])    a. 02/2019 s/p PCI/DES ->LAD.  Marland Kitchen Apical mural thrombus following MI ([place])    a. 02/2019 s/p Ant MI-->Echo: Small, fixed thrombus on the apical wall of LV-->coumadin.  . Barrett's esophagus   . CAD (coronary artery disease)    a. 02/2019 Ant STEMI/PCI: LM nl, LAD 100p (4x18 Resolute Onyx DES), LCX/RCA min irregs, EF 35-45%.  . Chronic back pain   . Colon polyps    a. 07/2017 Colonoscopy: 33mm sigmoid polyp, tubular adenoma,  diverticulosis, int hemorrhoids.  Marland Kitchen COPD (chronic obstructive pulmonary disease) ([place])   . Degeneration of lumbar intervertebral disc   . Dysphonia   . GERD (gastroesophageal reflux disease)   . HFrEF (heart failure with reduced ejection fraction) ([place])    a. 02/2019 Echo: EF 40-45%, apical, septal, inf, and inforlateral AK. Mid and apical anterior/mid anteroseptal AK. Mildly dil LA.  Marland Kitchen Hydrocele   . Hyperlipidemia   . Ischemic cardiomyopathy    a. 02/2019 Echo: EF 40-45%.  . Myalgia   . Osteoarthritis    C-spine  . PAF (paroxysmal atrial fibrillation) ([facility])    a. CHA2DS2VASc = 2.  . Peripheral vascular disease ([place])    a. 2018 s/p aorto-iliac stent graft.  . Reactive airway disease   . Seborrheic keratosis   . Sinus bradycardia    a. 02/2019 Jxnl rhythm and sinus brady in setting of Ant MI-->[facility] d/c'd.  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  . Tobacco use disorder   . Tremor     Past Surgical History:  Procedure Laterality Date  . [facility]  . COLONOSCOPY    . COLONOSCOPY WITH PROPOFOL N/A 08/05/2017   Procedure: COLONOSCOPY WITH PROPOFOL;  Surgeon: Virgel Manifold, MD;  Location: [facility] ENDOSCOPY;  Service: Endoscopy;  Laterality: N/A;  . CORONARY/GRAFT ACUTE MI REVASCULARIZATION N/A 03/12/2019  Procedure: Coronary/Graft Acute MI Revascularization;  Surgeon: Martinique, Peter M, MD;  Location: [facility];  Service: Cardiovascular;  Laterality: N/A;  . ESOPHAGOGASTRODUODENOSCOPY (EGD) WITH PROPOFOL N/A 08/05/2017   Procedure: ESOPHAGOGASTRODUODENOSCOPY (EGD) WITH PROPOFOL;  Surgeon: Virgel Manifold, MD;  Location: [facility] ENDOSCOPY;  Service: Endoscopy;  Laterality: N/A;  . LEFT HEART CATH AND CORONARY ANGIOGRAPHY N/A 03/12/2019   Procedure: LEFT HEART CATH AND CORONARY ANGIOGRAPHY;  Surgeon: Martinique, Peter M, MD;  Location: [facility];  Service: Cardiovascular;  Laterality: N/A;  . NASAL SINUS SURGERY     several   . SPLENECTOMY  01/2017    Current Medications: Current Meds  Medication Sig  . Ca Carbonate-Mag Hydroxide (ROLAIDS PO) Take 1 tablet by mouth at bedtime as needed (heartburn/acid reflux).  . cholecalciferol (VITAMIN D) 25 MCG (1000 UT) tablet Take 1,000 Units by mouth 2 (two) times daily.  . clopidogrel (PLAVIX) 75 MG tablet Take 1 tablet (75 mg total) by mouth daily.  . diclofenac sodium (VOLTAREN) 1 % GEL Apply 1 application topically daily as needed (pain).  . famotidine (PEPCID) 20 MG tablet Take 20 mg by mouth daily.   Marland Kitchen losartan (COZAAR) 25 MG tablet Take 0.5 tablets (12.5 mg total) by mouth daily.  . metoprolol succinate (TOPROL XL) 25 MG 24 hr tablet Take 0.5 tablets (12.5 mg total) by mouth daily.  . nitroGLYCERIN (NITROSTAT) 0.4 MG SL tablet Place 1 tablet (0.4 mg total) under the tongue every 5 (five) minutes x 3 doses as needed for chest pain.  Marland Kitchen oxymetazoline (AFRIN) 0.05 % nasal spray Place 1 spray into both nostrils 2 (two) times daily as needed for congestion.  . rosuvastatin (CRESTOR) 40 MG tablet Take 1 tablet (40 mg total) by mouth at bedtime.  . Simethicone (GAS-X PO) Take 1 tablet by mouth 3 (three) times daily as needed (heartburn).  . warfarin (COUMADIN) 5 MG tablet Take as directed by the anti-coag clinic.     Allergies:   Statins, Flunisolide, Nicotine polacrilex [nicotine], and Wellbutrin [bupropion]   Social History   Socioeconomic History  . Marital status: Married    Spouse name: Not on file  . Number of children: Not on file  . Years of education: Not on file  . Highest education level: Not on file  Occupational History  . Not on file  Social Needs  . Financial resource strain: Not hard at all  . Food insecurity    Worry: Never true    Inability: Never true  . Transportation needs    Medical: No    Non-medical: No  Tobacco Use  . Smoking status: Current Every Day Smoker    Packs/day: 1.00    Years: 40.00    Pack years: 40.00    Types:  Cigarettes  . Smokeless tobacco: Never Used  Substance and Sexual Activity  . Alcohol use: Yes    Comment: rare - 1-2x/yr  . Drug use: No  . Sexual activity: Yes  Lifestyle  . Physical activity    Days per week: 7 days    Minutes per session: 120 min  . Stress: Not at all  Relationships  . Social connections    Talks on phone: More than three times a week    Gets together: More than three times a week    Attends religious service: Patient refused    Active member of club or organization: Patient refused    Attends meetings of clubs or organizations: Patient refused  Relationship status: Married  Other Topics Concern  . Not on file  Social History Narrative  . Not on file     Family History: The patient's family history includes Arrhythmia in his mother; Diabetes in his brother.  ROS:   Please see the history of present illness.     All other systems reviewed and are negative.  EKGs/Labs/Other Studies Reviewed:    The following studies were reviewed today: TTE 07-Jul-2019 1. Left ventricular ejection fraction, by visual estimation, is 50 to 55%. The left ventricle has normal function. There is no left ventricular hypertrophy.  2. Left ventricular diastolic parameters are consistent with Grade I diastolic dysfunction (impaired relaxation).  3. Global right ventricle has normal systolic function.The right ventricular size is normal. No increase in right ventricular wall thickness.  4. Left atrial size was normal.  5. TR signal is inadequate for assessing pulmonary artery systolic pressure  LHC 123XX123  Prox LAD to Mid LAD lesion is 100% stenosed.  There is moderate left ventricular systolic dysfunction.  LV end diastolic pressure is mildly elevated.  The left ventricular ejection fraction is 35-45% by visual estimate.   1. Single vessel occlusive CAD involving the proximal LAD 2. Moderate LV dysfunction EF 40-45% 3. Mildly elevated LVEDP 4. Successful PCI with  stenting of the proximal LAD with DES   EKG:  EKG is not performed today  Recent Labs: 03/28/2019: BUN 14; Creatinine, Ser 1.00; Hemoglobin 15.4; Magnesium 2.4; Platelets 333; Potassium 4.0; Sodium 139 05/03/2019: ALT 24  Recent Lipid Panel    Component Value Date/Time   CHOL 96 (L) 05/03/2019 1433   TRIG 79 05/03/2019 1433   HDL 34 (L) 05/03/2019 1433   CHOLHDL 2.8 05/03/2019 1433   CHOLHDL 3.4 03/12/2019 0906   VLDL 14 03/12/2019 0906   LDLCALC 46 05/03/2019 1433    Physical Exam:    VS:  BP 114/60 (BP Location: Right Arm, Patient Position: Standing, Cuff Size: Normal)   Pulse 66   Ht 6\' 3"  (1.905 m)   Wt 183 lb 1 oz (83 kg)   SpO2 91%   BMI 22.88 kg/m     Wt Readings from Last 3 Encounters:  06/26/19 183 lb 1 oz (83 kg)  05/03/19 184 lb 4 oz (83.6 kg)  05/01/19 183 lb 3.2 oz (83.1 kg)     GEN:  Well nourished, well developed in no acute distress HEENT: Normal NECK: No JVD; No carotid bruits LYMPHATICS: No lymphadenopathy CARDIAC: RRR, no murmurs, rubs, gallops RESPIRATORY:  Clear to auscultation without rales, wheezing or rhonchi  ABDOMEN: Soft, non-tender, non-distended MUSCULOSKELETAL:  No edema; No deformity  SKIN: Warm and dry NEUROLOGIC:  Alert and oriented x 3 PSYCHIATRIC:  Normal affect   ASSESSMENT:    Echocardiogram with contrast did not show any evidence of LV thrombus.  Ejection fraction was normal with EF 50 to 55%.  Patient will continue warfarin for A. fib due to readily available reversal agents.  He usually goes deer hunting, walking with chainsaws and about an hour and a half from EMS. 1. Coronary artery disease involving native coronary artery of native heart without angina pectoris   2. PAF (paroxysmal atrial fibrillation) ([place])   3. PAD (peripheral artery disease) ([place])   4. Tobacco abuse   5. HFrEF (heart failure with reduced ejection fraction) (H[facility])      PLAN:    In order of problems listed above:  1. Currently without chest  pain.  Continue Plavix, Crestor 40 mg  daily. 2. Currently in sinus rhythm.  Continue Toprol-XL 12.5 mg, warfarin. 3. Plavix, Crestor.  Patient advised to follow-up at the [facility] with vascular surgery for possible progression of disease. 4. Cessation advised. 5. Continue Toprol-XL, losartan.  Last ejection fraction was 50 to 55%.  No evidence of LV thrombus.   Medication Adjustments/Labs and Tests Ordered: Current medicines are reviewed at length with the patient today.  Concerns regarding medicines are outlined above.  No orders of the defined types were placed in this encounter.  No orders of the defined types were placed in this encounter.   Patient Instructions  Medication Instructions:  Your physician recommends that you continue on your current medications as directed. Please refer to the Current Medication list given to you today.  *If you need a refill on your cardiac medications before your next appointment, please call your pharmacy*  Lab Work: None ordered If you have labs (blood work) drawn today and your tests are completely normal, you will receive your results only by: Marland Kitchen MyChart Message (if you have MyChart) OR . A paper copy in the mail If you have any lab test that is abnormal or we need to change your treatment, we will call you to review the results.  Testing/Procedures: None ordered  Follow-Up: At [facility], you and your health needs are our priority.  As part of our continuing mission to provide you with exceptional heart care, we have created designated Provider Care Teams.  These Care Teams include your primary Cardiologist (physician) and Advanced Practice Providers (APPs -  Physician Assistants and Nurse Practitioners) who all work together to provide you with the care you need, when you need it.  Your next appointment:   6 month(s)  The format for your next appointment:   In Person  Provider:    You may see Kate Sable, MD or one of the  following Advanced Practice Providers on your designated Care Team:    Murray Hodgkins, NP  Christell Faith, PA-C  Marrianne Mood, PA-C   Other Instructions N/A     Signed, Kate Sable, MD  06/26/2019 12:35 PM    [place]

## 2019-06-28 ENCOUNTER — Other Ambulatory Visit: Payer: Self-pay

## 2019-06-28 ENCOUNTER — Encounter: Payer: No Typology Code available for payment source | Admitting: *Deleted

## 2019-06-28 DIAGNOSIS — I213 ST elevation (STEMI) myocardial infarction of unspecified site: Secondary | ICD-10-CM | POA: Diagnosis not present

## 2019-06-28 NOTE — Progress Notes (Signed)
Daily Session Note  Patient Details  Name: Brian Mcclure MRN: [id number] Date of Birth: 08/06/1955 Referring Provider:     Cardiac Rehab from 05/01/2019 in [facility]  Referring Provider  Lynda Rainwater, MD      Encounter Date: 06/28/2019  Check In: Session Check In - 06/28/19 0944      Check-In   Supervising physician immediately available to respond to emergencies  See telemetry face sheet for immediately available ER MD    Location  [facility] Pul[facility]    Staff Present  Heath Lark, RN, BSN, CCRP;Joseph Hood RCP,RRT,BSRT    Virtual Visit  No    Medication changes reported      No    Fall or balance concerns reported     No    Warm-up and Cool-down  Performed on first and last piece of equipment    Resistance Training Performed  Yes    VAD Patient?  No    PAD/SET Patient?  No      Pain Assessment   Currently in Pain?  No/denies          Social History   Tobacco Use  Smoking Status Current Every Day Smoker  . Packs/day: 1.00  . Years: 40.00  . Pack years: 40.00  . Types: Cigarettes  Smokeless Tobacco Never Used    Goals Met:  Independence with exercise equipment Exercise tolerated well No report of cardiac concerns or symptoms  Goals Unmet:  Not Applicable  Comments: Pt able to follow exercise prescription today without complaint.  Will continue to monitor for progression.    [name] is Medical Director for [facility] and [name].

## 2019-07-10 ENCOUNTER — Telehealth: Payer: Self-pay

## 2019-07-10 NOTE — Telephone Encounter (Signed)
Discussed schedule changes with pt - he would like to be off the schedule until 07/31/2019   Staff will call to confirm time change week of 1/4

## 2019-07-12 ENCOUNTER — Ambulatory Visit (INDEPENDENT_AMBULATORY_CARE_PROVIDER_SITE_OTHER): Payer: No Typology Code available for payment source

## 2019-07-12 ENCOUNTER — Other Ambulatory Visit: Payer: Self-pay

## 2019-07-12 DIAGNOSIS — I236 Thrombosis of atrium, auricular appendage, and ventricle as current complications following acute myocardial infarction: Secondary | ICD-10-CM | POA: Diagnosis not present

## 2019-07-12 DIAGNOSIS — I480 Paroxysmal atrial fibrillation: Secondary | ICD-10-CM

## 2019-07-12 DIAGNOSIS — Z5181 Encounter for therapeutic drug level monitoring: Secondary | ICD-10-CM | POA: Diagnosis not present

## 2019-07-12 LAB — POCT INR: INR: 2.3 (ref 2.0–3.0)

## 2019-07-12 NOTE — Patient Instructions (Signed)
Please continue dosage of warfarin 2.5 mg every day. Recheck in 5 weeks.

## 2019-07-18 ENCOUNTER — Other Ambulatory Visit: Payer: Self-pay

## 2019-07-18 MED ORDER — WARFARIN SODIUM 5 MG PO TABS: ORAL_TABLET | ORAL | 0 refills | Status: DC

## 2019-07-18 NOTE — Telephone Encounter (Signed)
Called the pt because he needs a refill on his Warfarin until he receives a supply from the [facility]. Pt updated me to send to [facility] and he is aware that the refill is being sent at this time.

## 2019-07-19 ENCOUNTER — Encounter: Payer: Self-pay | Admitting: *Deleted

## 2019-07-19 DIAGNOSIS — I213 ST elevation (STEMI) myocardial infarction of unspecified site: Secondary | ICD-10-CM

## 2019-07-19 NOTE — Progress Notes (Signed)
Cardiac Individual Treatment Plan  Patient Details  Name: Brian Mcclure MRN: [id number] Date of Birth: 08-27-1955 Referring Provider:     Cardiac Rehab from 05/01/2019 in [facility] Cardiac and Pulmonary Rehab  Referring Provider  Lynda Rainwater, MD      Initial Encounter Date:    Cardiac Rehab from 05/01/2019 in [facility] Cardiac and Pulmonary Rehab  Date  05/01/19      Visit Diagnosis: ST elevation myocardial infarction (STEMI), unspecified artery ([place])  Patient's Home Medications on Admission:  Current Outpatient Medications:  .  Ca Carbonate-Mag Hydroxide (ROLAIDS PO), Take 1 tablet by mouth at bedtime as needed (heartburn/acid reflux)., Disp: , Rfl:  .  cholecalciferol (VITAMIN D) 25 MCG (1000 UT) tablet, Take 1,000 Units by mouth 2 (two) times daily., Disp: , Rfl:  .  clopidogrel (PLAVIX) 75 MG tablet, Take 1 tablet (75 mg total) by mouth daily., Disp: 30 tablet, Rfl: 1 .  diclofenac sodium (VOLTAREN) 1 % GEL, Apply 1 application topically daily as needed (pain)., Disp: , Rfl:  .  famotidine (PEPCID) 20 MG tablet, Take 20 mg by mouth daily. , Disp: , Rfl:  .  losartan (COZAAR) 25 MG tablet, Take 0.5 tablets (12.5 mg total) by mouth daily., Disp: 15 tablet, Rfl: 1 .  metoprolol succinate (TOPROL XL) 25 MG 24 hr tablet, Take 0.5 tablets (12.5 mg total) by mouth daily., Disp: 45 tablet, Rfl: 3 .  nitroGLYCERIN (NITROSTAT) 0.4 MG SL tablet, Place 1 tablet (0.4 mg total) under the tongue every 5 (five) minutes x 3 doses as needed for chest pain., Disp: 25 tablet, Rfl: 12 .  oxymetazoline (AFRIN) 0.05 % nasal spray, Place 1 spray into both nostrils 2 (two) times daily as needed for congestion., Disp: , Rfl:  .  rosuvastatin (CRESTOR) 40 MG tablet, Take 1 tablet (40 mg total) by mouth at bedtime., Disp: 30 tablet, Rfl: 1 .  Simethicone (GAS-X PO), Take 1 tablet by mouth 3 (three) times daily as needed (heartburn)., Disp: , Rfl:  .  warfarin (COUMADIN) 5 MG tablet, Take as directed by the  anti-coag clinic., Disp: 15 tablet, Rfl: 0  Past Medical History: Past Medical History:  Diagnosis Date  . Actinic keratosis   . Acute ST elevation myocardial infarction (STEMI) of anterior wall ([place])    a. 02/2019 s/p PCI/DES ->LAD.  Marland Kitchen Apical mural thrombus following MI ([place])    a. 02/2019 s/p Ant MI-->Echo: Small, fixed thrombus on the apical wall of LV-->coumadin.  . Barrett's esophagus   . CAD (coronary artery disease)    a. 02/2019 Ant STEMI/PCI: LM nl, LAD 100p (4x18 Resolute Onyx DES), LCX/RCA min irregs, EF 35-45%.  . Chronic back pain   . Colon polyps    a. 07/2017 Colonoscopy: 72m sigmoid polyp, tubular adenoma, diverticulosis, int hemorrhoids.  .Marland KitchenCOPD (chronic obstructive pulmonary disease) ([place]   . Degeneration of lumbar intervertebral disc   . Dysphonia   . GERD (gastroesophageal reflux disease)   . HFrEF (heart failure with reduced ejection fraction) ([place]    a. 02/2019 Echo: EF 40-45%, apical, septal, inf, and inforlateral AK. Mid and apical anterior/mid anteroseptal AK. Mildly dil LA.  .Marland KitchenHydrocele   . Hyperlipidemia   . Ischemic cardiomyopathy    a. 02/2019 Echo: EF 40-45%.  . Myalgia   . Osteoarthritis    C-spine  . PAF (paroxysmal atrial fibrillation) (HCC)    a. CHA2DS2VASc = 2.  . Peripheral vascular disease ([place]    a. 2018 s/p aorto-iliac stent  graft.  . Reactive airway disease   . Seborrheic keratosis   . Sinus bradycardia    a. 02/2019 Jxnl rhythm and sinus brady in setting of Ant MI-->[facility]B d/c'd.  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  . Tobacco use disorder   . Tremor     Tobacco Use: Social History   Tobacco Use  Smoking Status Current Every Day Smoker  . Packs/day: 1.00  . Years: 40.00  . Pack years: 40.00  . Types: Cigarettes  Smokeless Tobacco Never Used    Labs: Recent Review Flowsheet Data    Labs for ITP Cardiac and Pulmonary Rehab Latest Ref Rng & Units 03/12/2019 05/03/2019   Cholestrol 100 - 199 mg/dL 104  96(L)   LDLCALC 0 - 99 mg/dL 59 46   HDL >39 mg/dL 31(L) 34(L)   Trlycerides 0 - 149 mg/dL 70 79   Hemoglobin A1c 4.8 - 5.6 % 6.0(H) -   TCO2 22 - 32 mmol/L 18(L) -       Exercise Target Goals: Exercise Program Goal: Individual exercise prescription set using results from initial 6 min walk test and THRR while considering  patient's activity barriers and safety.   Exercise Prescription Goal: Initial exercise prescription builds to 30-45 minutes a day of aerobic activity, 2-3 days per week.  Home exercise guidelines will be given to patient during program as part of exercise prescription that the participant will acknowledge.  Activity Barriers & Risk Stratification: Activity Barriers & Cardiac Risk Stratification - 05/01/19 1017      Activity Barriers & Cardiac Risk Stratification   Activity Barriers  Joint Problems;Back Problems;Muscular Weakness;Deconditioning;Other (comment)    Comments  claudication pain    Cardiac Risk Stratification  High       6 Minute Walk: 6 Minute Walk    Row Name 05/01/19 1017         6 Minute Walk   Phase  Initial     Distance  1765 feet     Walk Time  6 minutes     # of Rest Breaks  0     MPH  3.34     METS  4.37     RPE  10     Perceived Dyspnea   1     VO2 Peak  15.3     Symptoms  Yes (comment)     Comments  claudication pain 3/5, SOB     Resting HR  52 bpm     Resting BP  118/64     Resting Oxygen Saturation   99 %     Exercise Oxygen Saturation  during 6 min walk  100 %     Max Ex. HR  92 bpm     Max Ex. BP  132/74     2 Minute Post BP  124/62        Oxygen Initial Assessment:   Oxygen Re-Evaluation:   Oxygen Discharge (Final Oxygen Re-Evaluation):   Initial Exercise Prescription: Initial Exercise Prescription - 05/01/19 1000      Date of Initial Exercise RX and Referring Provider   Date  05/01/19    Referring Provider  Lynda Rainwater, MD      Treadmill   MPH  3    Grade  1    Minutes  15    METs  3.71       Recumbant Bike   Level  3    RPM  50  Watts  63    Minutes  15    METs  3      NuStep   Level  4    SPM  80    Minutes  15    METs  3      Prescription Details   Frequency (times per week)  2    Duration  Progress to 30 minutes of continuous aerobic without signs/symptoms of physical distress      Intensity   THRR 40-80% of Max Heartrate  94-136    Ratings of Perceived Exertion  11-13    Perceived Dyspnea  0-4      Progression   Progression  Continue to progress workloads to maintain intensity without signs/symptoms of physical distress.      Resistance Training   Training Prescription  Yes    Weight  4 lbs    Reps  10-15       Perform Capillary Blood Glucose checks as needed.  Exercise Prescription Changes: Exercise Prescription Changes    Row Name 05/01/19 1000 05/18/19 1400 05/31/19 1200 06/13/19 1400 06/28/19 1500     Response to Exercise   Blood Pressure (Admit)  118/64  122/64  120/60  110/60  128/62   Blood Pressure (Exercise)  132/74  134/64  126/60  142/70  150/60   Blood Pressure (Exit)  124/62  96/56  110/62  122/64  130/62   Heart Rate (Admit)  52 bpm  83 bpm  69 bpm  61 bpm  70 bpm   Heart Rate (Exercise)  92 bpm  117 bpm  118 bpm  121 bpm  114 bpm   Heart Rate (Exit)  --  82 bpm  77 bpm  74 bpm  70 bpm   Oxygen Saturation (Admit)  99 %  --  --  --  --   Oxygen Saturation (Exercise)  100 %  --  --  --  --   Rating of Perceived Exertion (Exercise)  10  --  _0 Perceived Dyspnea (Exercise)  1  --  --  --  --   Symptoms  claudication pain 3/5, SOB  none  none  none  none   Comments  walk test results  --  --  --  --   Duration  --  Continue with 30 min of aerobic exercise without signs/symptoms of physical distress.  Continue with 30 min of aerobic exercise without signs/symptoms of physical distress.  Continue with 30 min of aerobic exercise without signs/symptoms of physical distress.  Continue with 30 min of aerobic exercise without  signs/symptoms of physical distress.   Intensity  --  THRR unchanged  THRR unchanged  THRR unchanged  THRR unchanged     Progression   Progression  --  Continue to progress workloads to maintain intensity without signs/symptoms of physical distress.  Continue to progress workloads to maintain intensity without signs/symptoms of physical distress.  Continue to progress workloads to maintain intensity without signs/symptoms of physical distress.  Continue to progress workloads to maintain intensity without signs/symptoms of physical distress.   Average METs  --  4.05  5.47  5.6  5.29     Resistance Training   Training Prescription  --  Yes  Yes  Yes  Yes   Weight  --  0 ROM only due to shoulder problem  6 lbs  6 lb  6 lb   Reps  --  10-15  10-15  10-15  10-15     Interval Training   Interval Training  --  --  No  No  No     Treadmill   MPH  --  3.5  3.5  3.5  3.5   Grade  --  _0 Minutes  --  _1 METs  --  4.16  4.16  4.16  4.16     Recumbant Bike   Level  --  --  _2 Minutes  --  --  _3 METs  --  --  5.5  5.5  --     NuStep   Level  --  --  _4 Minutes  --  --  _5 METs  --  --  6.8  6.6  8.7     Recumbant Elliptical   Level  --  --  --  --  4.5   Minutes  --  --  --  --  15   METs  --  --  --  --  3     T5 Nustep   Level  --  7  --  --  --   SPM  --  80  --  --  --   Minutes  --  15  --  --  --   METs  --  4  --  --  --     Home Exercise Plan   Plans to continue exercise at  --  --  Home (comment) walking  Home (comment) walking  Home (comment) walking   Frequency  --  --  Add 2 additional days to program exercise sessions.  Add 2 additional days to program exercise sessions.  Add 2 additional days to program exercise sessions.   Initial Home Exercises Provided  --  --  05/08/19  05/08/19  05/08/19      Exercise Comments: Exercise Comments    Row Name 05/01/19 0948           Exercise Comments  First full day  of exercise!  Patient was oriented to gym and equipment including functions, settings, policies, and procedures.  Patient's individual exercise prescription and treatment plan were reviewed.  All starting workloads were established based on the results of the 6 minute walk test done at initial orientation visit.  The plan for exercise progression was also introduced and progression will be customized based on patient's performance and goals.          Exercise Goals and Review: Exercise Goals    Row Name 05/01/19 1021             Exercise Goals   Increase Physical Activity  Yes       Intervention  Provide advice, education, support and counseling about physical activity/exercise needs.;Develop an individualized exercise prescription for aerobic and resistive training based on initial evaluation findings, risk stratification, comorbidities and participant's personal goals.       Expected Outcomes  Short Term: Attend rehab on a regular basis to increase amount of physical activity.;Long Term: Add in home exercise to make exercise part of routine and to increase amount of physical activity.;Long Term: Exercising regularly at least 3-5 days a week.       Increase Strength and Stamina  Yes  Intervention  Provide advice, education, support and counseling about physical activity/exercise needs.;Develop an individualized exercise prescription for aerobic and resistive training based on initial evaluation findings, risk stratification, comorbidities and participant's personal goals.       Expected Outcomes  Short Term: Increase workloads from initial exercise prescription for resistance, speed, and METs.;Short Term: Perform resistance training exercises routinely during rehab and add in resistance training at home;Long Term: Improve cardiorespiratory fitness, muscular endurance and strength as measured by increased METs and functional capacity (6MWT)       Able to understand and use rate of perceived  exertion (RPE) scale  Yes       Intervention  Provide education and explanation on how to use RPE scale       Expected Outcomes  Short Term: Able to use RPE daily in rehab to express subjective intensity level;Long Term:  Able to use RPE to guide intensity level when exercising independently       Able to understand and use Dyspnea scale  Yes       Intervention  Provide education and explanation on how to use Dyspnea scale       Expected Outcomes  Short Term: Able to use Dyspnea scale daily in rehab to express subjective sense of shortness of breath during exertion;Long Term: Able to use Dyspnea scale to guide intensity level when exercising independently       Knowledge and understanding of Target Heart Rate Range (THRR)  Yes       Intervention  Provide education and explanation of THRR including how the numbers were predicted and where they are located for reference       Expected Outcomes  Short Term: Able to state/look up THRR;Short Term: Able to use daily as guideline for intensity in rehab;Long Term: Able to use THRR to govern intensity when exercising independently       Able to check pulse independently  Yes       Intervention  Provide education and demonstration on how to check pulse in carotid and radial arteries.;Review the importance of being able to check your own pulse for safety during independent exercise       Expected Outcomes  Short Term: Able to explain why pulse checking is important during independent exercise;Long Term: Able to check pulse independently and accurately       Understanding of Exercise Prescription  Yes       Intervention  Provide education, explanation, and written materials on patient's individual exercise prescription       Expected Outcomes  Short Term: Able to explain program exercise prescription;Long Term: Able to explain home exercise prescription to exercise independently       Improve claudication pain toleration; Improve walking ability  Yes        Intervention  Participate in PAD/SET Rehab 2-3 days a week, walking at home as part of exercise prescription;Attend education sessions to aid in risk factor modification and understanding of disease process       Expected Outcomes  Short Term: Improve walking distance/time to onset of claudication pain;Long Term: Improve walking ability and toleration to claudication          Exercise Goals Re-Evaluation : Exercise Goals Re-Evaluation    Row Name 05/01/19 1026 05/08/19 1022 05/10/19 0847 05/18/19 1413 05/31/19 1225     Exercise Goal Re-Evaluation   Exercise Goals Review  Increase Physical Activity;Increase Strength and Stamina;Understanding of Exercise Prescription  Increase Physical Activity;Increase Strength and Stamina;Understanding of Exercise Prescription;Able to  understand and use rate of perceived exertion (RPE) scale;Knowledge and understanding of Target Heart Rate Range (THRR);Able to check pulse independently  Understanding of Exercise Prescription;Increase Strength and Stamina;Increase Physical Activity  Increase Physical Activity;Increase Strength and Stamina;Able to understand and use rate of perceived exertion (RPE) scale;Knowledge and understanding of Target Heart Rate Range (THRR);Able to check pulse independently;Understanding of Exercise Prescription  Increase Physical Activity;Increase Strength and Stamina;Understanding of Exercise Prescription   Comments  Reviewed RPE scale, THR and program prescription with pt today.  Pt voiced understanding and was given a copy of goals to take home.  Reviewed home exercise with pt today.  Pt plans to walk and do yard work for exercise.  Reviewed THR, pulse, RPE, sign and symptoms, NTG use, and when to call 911 or MD.  Also discussed weather considerations and indoor options.  Pt voiced understanding.  Patient states that he will walk when he is done with rehab. Patient has a pulse ox to chech is heart rate and oxygen. John will have home exercise  when he has had a little more time to get use to rehab. Explained to patient why it is important to continue exercising when he is done with the program. He wants to be able to jog more.  Jon tolerates exercise well and worked at around 4 METS.  Staff will work with him on getting back to jogging  Wille Glaser has been doing well in rehab.  He is up to level 7 on the NuStep.  We will continue to monitor his progress.   Expected Outcomes  Short: Use RPE daily to regulate intensity. Long: Follow program prescription in THR.  Short: add 1-2 days of walking at home following home exercise guidlines. Long: Become indepenedent with exercise program.  Short: increase strength and stamina. Long: be able to jog.  Short - attend consistently Long - incorporate joggging into program  Short: Walk on vacation.  Long: Continue to improve stamina.   [place] Name 06/12/19 0941 06/13/19 1437 06/28/19 1521         Exercise Goal Re-Evaluation   Exercise Goals Review  Increase Physical Activity;Increase Strength and Stamina;Understanding of Exercise Prescription  Increase Physical Activity;Increase Strength and Stamina;Able to understand and use rate of perceived exertion (RPE) scale;Able to understand and use Dyspnea scale;Understanding of Exercise Prescription  Increase Physical Activity;Increase Strength and Stamina;Understanding of Exercise Prescription     Comments  Wille Glaser continues to attend cardiac rehab regularly. He reports doing some form of exercise on all days he is not in class. He has made steady increases in his workloads in class and reports he feels he has had an increase in stamina and is able to do more at home without taking as many breaks.  John was out for two weeks on vacation.  He has been able to maintain previous levels on machines  Wille Glaser is doing well in rehab.  He was able to return to his previous workloads and should now be ready to start to move up some more or talk about intervals.  We will continue to monitor his  progress.     Expected Outcomes  Short: continue to attend cariac rehab classes regularly. Long: Continue to impove stamina and become independent with exercise program.  Short - continueto attend consistently Long - increase MET level  Short: Talk about intervals.  Long: Continue to improve stamina.        Discharge Exercise Prescription (Final Exercise Prescription Changes): Exercise Prescription Changes - 06/28/19 1500  Response to Exercise   Blood Pressure (Admit)  128/62    Blood Pressure (Exercise)  150/60    Blood Pressure (Exit)  130/62    Heart Rate (Admit)  70 bpm    Heart Rate (Exercise)  114 bpm    Heart Rate (Exit)  70 bpm    Rating of Perceived Exertion (Exercise)  15    Symptoms  none    Duration  Continue with 30 min of aerobic exercise without signs/symptoms of physical distress.    Intensity  THRR unchanged      Progression   Progression  Continue to progress workloads to maintain intensity without signs/symptoms of physical distress.    Average METs  5.29      Resistance Training   Training Prescription  Yes    Weight  6 lb    Reps  10-15      Interval Training   Interval Training  No      Treadmill   MPH  3.5    Grade  1    Minutes  15    METs  4.16      Recumbant Bike   Level  4    Minutes  15      NuStep   Level  7    Minutes  15    METs  8.7      Recumbant Elliptical   Level  4.5    Minutes  15    METs  3      Home Exercise Plan   Plans to continue exercise at  Home (comment)   walking   Frequency  Add 2 additional days to program exercise sessions.    Initial Home Exercises Provided  05/08/19       Nutrition:  Target Goals: Understanding of nutrition guidelines, daily intake of sodium <1519m, cholesterol <2015m calories 30% from fat and 7% or less from saturated fats, daily to have 5 or more servings of fruits and vegetables.  Biometrics: Pre Biometrics - 05/01/19 1021      Pre Biometrics   Height  6' 1.5" (1.867 m)     Weight  183 lb 3.2 oz (83.1 kg)    BMI (Calculated)  23.84    Single Leg Stand  30 seconds        Nutrition Therapy Plan and Nutrition Goals: Nutrition Therapy & Goals - 04/18/19 1209      Nutrition Therapy   Diet  low Na, HH diet    Drug/Food Interactions  Coumadin/Vit K    Protein (specify units)  65g    Fiber  30 grams    Whole Grain Foods  3 servings    Saturated Fats  12 max. grams    Fruits and Vegetables  5 servings/day    Sodium  1.5 grams      Personal Nutrition Goals   Nutrition Goal  ST: eat salads 2x/week LT: feel better    Comments  Pt reports working all day doing chores. Pt reports that he sometimes does eat breakfast, but if he does he will have a bagel and cream cheese or ham biscuit. L and D is usually chicken and a starch, pt reports liking vegetables. Dessert is usually some chocolate and ~10 vanilla wafers with peanut butter. Discussed HH eating. Pt reports he thinks his diet is ok but he should eat more salad. Discussed consistency and vitaminK and medication.      Intervention Plan   Intervention  Prescribe, educate and counsel  regarding individualized specific dietary modifications aiming towards targeted core components such as weight, hypertension, lipid management, diabetes, heart failure and other comorbidities.;Nutrition handout(s) given to patient.    Expected Outcomes  Short Term Goal: Understand basic principles of dietary content, such as calories, fat, sodium, cholesterol and nutrients.;Short Term Goal: A plan has been developed with personal nutrition goals set during dietitian appointment.;Long Term Goal: Adherence to prescribed nutrition plan.       Nutrition Assessments: Nutrition Assessments - 05/01/19 1023      MEDFICTS Scores   Pre Score  38       Nutrition Goals Re-Evaluation: Nutrition Goals Re-Evaluation    Row Name 05/22/19 1024 06/14/19 9604           Goals   Nutrition Goal  ST: eat salads 2x/week LT: feel better  ST: include  more vegetables - add 2x/week additional servings LT: feel better      Comment  Continue with current changes  Pt reports diet is not as good as it could be. Pt able to add salads in some of the time. Pt reports diet stayed mostly the same since we last spoke. Pt reports that he wants to get in more vegetables, but it is hard with a small household (preparing full meals), discussed easy ways to include vegetables into meals he is already having. Pt reported concenr for medication and vitamin K again, reiterated thagt it was about consistency and if he wanted to increase his intake of these foods he would just have to be consistent and talk to his doctor beforehand.      Expected Outcome  ST: eat salads 2x/week LT: feel better  ST: include more vegetables - add 2x/week additional servings LT: feel better         Nutrition Goals Discharge (Final Nutrition Goals Re-Evaluation): Nutrition Goals Re-Evaluation - 06/14/19 5409      Goals   Nutrition Goal  ST: include more vegetables - add 2x/week additional servings LT: feel better    Comment  Pt reports diet is not as good as it could be. Pt able to add salads in some of the time. Pt reports diet stayed mostly the same since we last spoke. Pt reports that he wants to get in more vegetables, but it is hard with a small household (preparing full meals), discussed easy ways to include vegetables into meals he is already having. Pt reported concenr for medication and vitamin K again, reiterated thagt it was about consistency and if he wanted to increase his intake of these foods he would just have to be consistent and talk to his doctor beforehand.    Expected Outcome  ST: include more vegetables - add 2x/week additional servings LT: feel better       Psychosocial: Target Goals: Acknowledge presence or absence of significant depression and/or stress, maximize coping skills, provide positive support system. Participant is able to verbalize types and ability to  use techniques and skills needed for reducing stress and depression.   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 He is hoping being here will help him get stronger and increase his stamina      Family Dynamics  Good Support System?  Yes      Barriers   Psychosocial barriers to participate in program  There are no identifiable barriers or psychosocial needs.;The patient should benefit from training in stress management and relaxation.      Screening Interventions   Interventions  Encouraged to exercise;Program counselor consult;To provide support and resources with identified psychosocial needs    Expected Outcomes  Short Term goal: Utilizing psychosocial counselor, staff and physician to assist with identification of specific Stressors or current issues interfering with healing process. Setting desired goal for each stressor or current issue identified.;Long Term Goal: Stressors or current issues are controlled or eliminated.;Short Term goal: Identification and review with participant of any Quality of Life or Depression concerns found by scoring the questionnaire.;Long Term goal: The participant improves quality of Life and PHQ9 Scores as seen by post scores and/or verbalization of changes       Quality of Life Scores:  Quality of Life - 05/01/19 1022      Quality of Life   Select  Quality of Life      Quality of Life Scores   Health/Function Pre  23.53 %    Socioeconomic Pre  24.42 %    Psych/Spiritual Pre  27 %    Family Pre  27.1 %    GLOBAL Pre  24.97 %      Scores of 19 and below usually indicate a poorer quality of life in these areas.  A difference of  2-3 points is a clinically meaningful difference.  A difference of  2-3 points in the total score of the Quality of Life Index has been associated with significant improvement in overall quality of life, self-image, physical symptoms, and general health in studies assessing change in quality of life.  PHQ-9: Recent Review Flowsheet Data    Depression screen [facility] 2/9 05/01/2019   Decreased Interest 0   Down, Depressed, Hopeless 0   PHQ - 2 Score 0   Altered sleeping 0   Tired, decreased energy 1   Change in appetite 1   Feeling bad or failure about yourself  0   Trouble concentrating 0   Moving slowly or fidgety/restless 0   Suicidal thoughts 0   PHQ-9 Score 2   Difficult doing work/chores Somewhat difficult     Interpretation of Total Score  Total Score Depression Severity:  1-4 = Minimal depression, 5-9 = Mild depression, 10-14 = Moderate depression, 15-19 = Moderately severe depression, 20-27 = Severe depression   Psychosocial Evaluation and Intervention: Psychosocial Evaluation - 04/17/19 1416      Psychosocial Evaluation & Interventions   Comments  Feels behind of doing things around the house and 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 He is hoping being here will help him get stronger and increase his stamina    Expected Outcomes  Short: attend [facility] on consistent basis to achieve goals. Long: develop self care habits    Continue Psychosocial Services   Follow up required by staff       Psychosocial Re-Evaluation: Psychosocial Re-Evaluation    [place] Name 05/10/19 0853 06/12/19 0959           Psychosocial Re-Evaluation   Current issues with  Current Stress Concerns  Current Stress Concerns      Comments  Patient wants to get his health back on track. He wants to do things like remodel and downsize but feels like he does not  have enough time left. He feels like he is not checking off things off his bucket list. Jenny Reichmann is retired and finding himself very busy and not enough time to do things  around the house.  Patient stated that he has no new stress concerns and feels he is able to manage this aspect of his life. He stated that he feels his stamina is increasing and he can work on project at more of a steady pace now and take fewer breaks. He reports sleeping well at night.      Expected Outcomes  Short: continue to exercise to reduce stress. Long: maintain exercise to keep stress at a minimum.  Short: continue to exercise to reduce stress. Long: maintain exercise to keep stress at a minimum.      Interventions  Encouraged to attend Cardiac Rehabilitation for the exercise  --      Continue Psychosocial Services   Follow up required by staff  Follow up required by staff         Psychosocial Discharge (Final Psychosocial Re-Evaluation): Psychosocial Re-Evaluation - 06/12/19 0959      Psychosocial Re-Evaluation   Current issues with  Current Stress Concerns    Comments  Patient stated that he has no new stress concerns and feels he is able to manage this aspect of his life. He stated that he feels his stamina is increasing and he can work on project at more of a steady pace now and take fewer breaks. He reports sleeping well at night.    Expected Outcomes  Short: continue to exercise to reduce stress. Long: maintain exercise to keep stress at a minimum.    Continue Psychosocial Services   Follow up required by staff       Vocational Rehabilitation: Provide vocational rehab assistance to qualifying candidates.   Vocational Rehab Evaluation & Intervention: Vocational Rehab - 04/17/19 1406      Initial Vocational Rehab Evaluation & Intervention   Assessment shows need for Vocational Rehabilitation  No       Education: Education Goals: Education classes will be provided on a variety of topics geared toward better understanding of heart health and risk factor modification. Participant will state understanding/return demonstration of topics presented as noted by education test  scores.  Learning Barriers/Preferences: Learning Barriers/Preferences - 04/17/19 1406      Learning Barriers/Preferences   Learning Barriers  None    Learning Preferences  Individual Instruction       Education Topics:  AED/CPR: - Group verbal and written instruction with the use of models to demonstrate the basic use of the AED with the basic ABC's of resuscitation.   General Nutrition Guidelines/Fats and Fiber: -Group instruction provided by verbal, written material, models and posters to present the general guidelines for heart healthy nutrition. Gives an explanation and review of dietary fats and fiber.   Controlling Sodium/Reading Food Labels: -Group verbal and written material supporting the discussion of sodium use in heart healthy nutrition. Review and explanation with models, verbal and written materials for utilization of the food label.   Exercise Physiology & General Exercise Guidelines: - Group verbal and written instruction with models to review the exercise physiology of the cardiovascular system and associated critical values. Provides general exercise guidelines with specific guidelines to those with heart or lung disease.    Aerobic Exercise & Resistance Training: - Gives group verbal and written instruction on the various components of exercise. Focuses on aerobic and resistive training programs and the benefits of  this training and how to safely progress through these programs..   Flexibility, Balance, Mind/Body Relaxation: Provides group verbal/written instruction on the benefits of flexibility and balance training, including mind/body exercise modes such as yoga, pilates and tai chi.  Demonstration and skill practice provided.   Stress and Anxiety: - Provides group verbal and written instruction about the health risks of elevated stress and causes of high stress.  Discuss the correlation between heart/lung disease and anxiety and treatment options. Review  healthy ways to manage with stress and anxiety.   Depression: - Provides group verbal and written instruction on the correlation between heart/lung disease and depressed mood, treatment options, and the stigmas associated with seeking treatment.   Anatomy & Physiology of the Heart: - Group verbal and written instruction and models provide basic cardiac anatomy and physiology, with the coronary electrical and arterial systems. Review of Valvular disease and Heart Failure   Cardiac Procedures: - Group verbal and written instruction to review commonly prescribed medications for heart disease. Reviews the medication, class of the drug, and side effects. Includes the steps to properly store meds and maintain the prescription regimen. (beta blockers and nitrates)   Cardiac Medications I: - Group verbal and written instruction to review commonly prescribed medications for heart disease. Reviews the medication, class of the drug, and side effects. Includes the steps to properly store meds and maintain the prescription regimen.   Cardiac Medications II: -Group verbal and written instruction to review commonly prescribed medications for heart disease. Reviews the medication, class of the drug, and side effects. (all other drug classes)    Go Sex-Intimacy & Heart Disease, Get SMART - Goal Setting: - Group verbal and written instruction through game format to discuss heart disease and the return to sexual intimacy. Provides group verbal and written material to discuss and apply goal setting through the application of the S.M.A.R.T. Method.   Other Matters of the Heart: - Provides group verbal, written materials and models to describe Stable Angina and Peripheral Artery. Includes description of the disease process and treatment options available to the cardiac patient.   Exercise & Equipment Safety: - Individual verbal instruction and demonstration of equipment use and safety with use of the  equipment.   Infection Prevention: - Provides verbal and written material to individual with discussion of infection control including proper hand washing and proper equipment cleaning during exercise session.   Falls Prevention: - Provides verbal and written material to individual with discussion of falls prevention and safety.   Diabetes: - Individual verbal and written instruction to review signs/symptoms of diabetes, desired ranges of glucose level fasting, after meals and with exercise. Acknowledge that pre and post exercise glucose checks will be done for 3 sessions at entry of program.   Know Your Numbers and Risk Factors: -Group verbal and written instruction about important numbers in your health.  Discussion of what are risk factors and how they play a role in the disease process.  Review of Cholesterol, Blood Pressure, Diabetes, and BMI and the role they play in your overall health.   Sleep Hygiene: -Provides group verbal and written instruction about how sleep can affect your health.  Define sleep hygiene, discuss sleep cycles and impact of sleep habits. Review good sleep hygiene tips.    Other: -Provides group and verbal instruction on various topics (see comments)   Knowledge Questionnaire Score: Knowledge Questionnaire Score - 05/01/19 1023      Knowledge Questionnaire Score   Pre Score  21/26 education  focus: A&P, PAD, Nutrition, Exercise       Core Components/Risk Factors/Patient Goals at Admission: Personal Goals and Risk Factors at Admission - 05/01/19 1024      Core Components/Risk Factors/Patient Goals on Admission    Weight Management  Yes;Weight Maintenance    Intervention  Weight Management: Develop a combined nutrition and exercise program designed to reach desired caloric intake, while maintaining appropriate intake of nutrient and fiber, sodium and fats, and appropriate energy expenditure required for the weight goal.;Weight Management: Provide  education and appropriate resources to help participant work on and attain dietary goals.    Admit Weight  183 lb 3.2 oz (83.1 kg)    Goal Weight: Short Term  183 lb (83 kg)    Goal Weight: Long Term  183 lb (83 kg)    Expected Outcomes  Short Term: Continue to assess and modify interventions until short term weight is achieved;Long Term: Adherence to nutrition and physical activity/exercise program aimed toward attainment of established weight goal;Weight Maintenance: Understanding of the daily nutrition guidelines, which includes 25-35% calories from fat, 7% or less cal from saturated fats, less than 221m cholesterol, less than 1.5gm of sodium, & 5 or more servings of fruits and vegetables daily    Number of packs per day  1 pack/ day    Intervention  Assist the participant in steps to quit. Provide individualized education and counseling about committing to Tobacco Cessation, relapse prevention, and pharmacological support that can be provided by physician.;OAdvice worker assist with locating and accessing local/national Quit Smoking programs, and support quit date choice.    Expected Outcomes  Short Term: Will demonstrate readiness to quit, by selecting a quit date.;Short Term: Will quit all tobacco product use, adhering to prevention of relapse plan.;Long Term: Complete abstinence from all tobacco products for at least 12 months from quit date.    Lipids  Yes    Intervention  Provide education and support for participant on nutrition & aerobic/resistive exercise along with prescribed medications to achieve LDL <723m HDL >4067m   Expected Outcomes  Short Term: Participant states understanding of desired cholesterol values and is compliant with medications prescribed. Participant is following exercise prescription and nutrition guidelines.;Long Term: Cholesterol controlled with medications as prescribed, with individualized exercise RX and with personalized nutrition plan. Value  goals: LDL < 13m55mDL > 40 mg.       Core Components/Risk Factors/Patient Goals Review:  Goals and Risk Factor Review    Row Name 05/10/19 0858 06/12/19 0943           Core Components/Risk Factors/Patient Goals Review   Personal Goals Review  Weight Management/Obesity;Improve shortness of breath with ADL's;Tobacco Cessation;Lipids  Weight Management/Obesity;Tobacco Cessation;Lipids;Improve shortness of breath with ADL's      Review  John smokes a pack a day and does not have any interest in quitting. He is managing his weight and staying around 183 pounds. His lipids are doing well and he is taking medication for it. He has labs done recently and he states it was below 70. 72ohn continues to maintain his weight, and take all medications as perscribed to control lipid levels. He continues to smoke a pack and day and is not interested in quitting. Patient stated that shortness of breath does not limit him so much, but more leg fatigue. He does feel that his stamina is increasing and he does not have to take as many rest breaks while doing physical work as he used to. He  is now able to work at a steady pace.      Expected Outcomes  Short: continue to exercise to improve ADL'S. Long: maintain ADL independently.  Short: continue to exercise to improve ADL'S. Long: maintain ADL independently.         Core Components/Risk Factors/Patient Goals at Discharge (Final Review):  Goals and Risk Factor Review - 06/12/19 0943      Core Components/Risk Factors/Patient Goals Review   Personal Goals Review  Weight Management/Obesity;Tobacco Cessation;Lipids;Improve shortness of breath with ADL's    Review  Jenny Reichmann continues to maintain his weight, and take all medications as perscribed to control lipid levels. He continues to smoke a pack and day and is not interested in quitting. Patient stated that shortness of breath does not limit him so much, but more leg fatigue. He does feel that his stamina is increasing  and he does not have to take as many rest breaks while doing physical work as he used to. He is now able to work at a steady pace.    Expected Outcomes  Short: continue to exercise to improve ADL'S. Long: maintain ADL independently.       ITP Comments: ITP Comments    Row Name 04/17/19 1401 04/18/19 1211 04/18/19 1311 04/26/19 1112 05/01/19 0947   ITP Comments  Virtual Initial Oriention completed. Diagnosis can be found in [facility] 8/23. EP/RD orientation scheduled fro 9/29 at 11 am  completed RD initial eval  When patient arrived today patient reported to Nada Maclachlan, EP, that he had not taken his Plavix in 3 days due to being out of the medication.  The reason reported was he had not yet received his medications in the mail from the [place].  Patient reported this is the only medication he has missed.  Patient no longer taking baby aspirin as he was advised to stop Aspirin after one month.  He is also on Coumadin due to afib / flutter.  Sent secure chat message to Ronda Fairly, RN and Ignacia Bayley, NP at [facility] to request Plavix prescription be called in to [place] in [place].  Patient has [employer] in addition to the [place]. Verlon Au, RN responded on [employer] letting me know she had called the prescription in.  Patient instructed to go pick up Plavix prescription and take medication today.  Note:  Patient did not exercise today.     Patient will return on Monday, April 24, 2019 to start Cardiac Rehab.  30 day review completed. ITP sent to [name], Medical Director of Cardiac and Pulmonary Rehab. Continue with ITP unless changes are made by physician.  Department closed starting 10/2 until further notice by infection prevention and Health at Work teams for [place].  First full day of exercise!  Patient was oriented to gym and equipment including functions, settings, policies, and procedures.  Patient's individual exercise prescription and treatment plan were reviewed.   All starting workloads were established based on the results of the 6 minute walk test done at initial orientation visit.  The plan for exercise progression was also introduced and progression will be customized based on patient's performance and goals.   [place] Name 05/24/19 0637 05/31/19 1225 07/11/19 1447 07/19/19 0852     ITP Comments  30 day review completed. Continue with ITP sent to [name], Medical Director of Cardiac and Pulmonary Rehab for review , changes as needed and signature.  Wille Glaser is out of town for vacation for two  weeks.  Wille Glaser has not attended since 06/28/2019.  He plans to return after New Year.  30 day review competed . ITP sent to Dr Emily Filbert for review, changes as needed and ITP approval signature       Comments:

## 2019-07-24 ENCOUNTER — Telehealth: Payer: Self-pay

## 2019-07-24 NOTE — Telephone Encounter (Signed)
Spoke with Wille Glaser to schedule sessions

## 2019-07-26 ENCOUNTER — Telehealth: Payer: Self-pay

## 2019-07-26 NOTE — Telephone Encounter (Signed)
Patient would like a call back regarding an upcoming dental appointment.

## 2019-07-26 NOTE — Telephone Encounter (Signed)
Spoke w/ pt.  He [facility]. He does not know how they will proceed, but just wanted to be educated before he went in. Advised him that if is 1 tooth bothering him, even if they pull it, they generally would not hold warfarin for that.  Advised him that his INR was 2.3 at last check on 07/12/19 & I can fax this to his dentist if they would like.  He is appreciative and will call back if we can be of further assistance.

## 2019-07-28 ENCOUNTER — Encounter: Payer: Self-pay | Admitting: *Deleted

## 2019-07-28 DIAGNOSIS — I213 ST elevation (STEMI) myocardial infarction of unspecified site: Secondary | ICD-10-CM

## 2019-07-31 ENCOUNTER — Encounter: Payer: No Typology Code available for payment source | Attending: Internal Medicine | Admitting: *Deleted

## 2019-07-31 ENCOUNTER — Other Ambulatory Visit: Payer: Self-pay

## 2019-07-31 DIAGNOSIS — I213 ST elevation (STEMI) myocardial infarction of unspecified site: Secondary | ICD-10-CM | POA: Diagnosis present

## 2019-07-31 NOTE — Progress Notes (Signed)
Daily Session Note  Patient Details  Name: Brian Mcclure MRN: [id number] Date of Birth: [birth year] Referring Provider:     Cardiac Rehab from 05/01/2019 in [facility] Cardiac and Pulmonary Rehab  Referring Provider  Lynda Rainwater, MD      Encounter Date: 07/31/2019  Check In: Session Check In - 07/31/19 1506      Check-In   Supervising physician immediately available to respond to emergencies  See telemetry face sheet for immediately available ER MD    Location  [facility]-Cardiac & Pulmonary Rehab    Staff Present  Renita Papa, RN Moises Blood, BS, ACSM CEP, Exercise Physiologist;Joseph Tessie Fass RCP,RRT,BSRT    Virtual Visit  No    Medication changes reported      No    Fall or balance concerns reported     No    Warm-up and Cool-down  Performed on first and last piece of equipment    Resistance Training Performed  Yes    VAD Patient?  No    PAD/SET Patient?  No      Pain Assessment   Currently in Pain?  No/denies          Social History   Tobacco Use  Smoking Status Current Every Day Smoker  . Packs/day: 1.00  . Years: 40.00  . Pack years: 40.00  . Types: Cigarettes  Smokeless Tobacco Never Used    Goals Met:  Independence with exercise equipment Exercise tolerated well No report of cardiac concerns or symptoms Strength training completed today  Goals Unmet:  Not Applicable  Comments: Pt able to follow exercise prescription today without complaint.  Will continue to monitor for progression.    [name] is Medical Director for [place] and [name].

## 2019-08-10 ENCOUNTER — Ambulatory Visit: Payer: No Typology Code available for payment source | Attending: Internal Medicine

## 2019-08-10 ENCOUNTER — Telehealth: Payer: Self-pay | Admitting: *Deleted

## 2019-08-10 DIAGNOSIS — Z20822 Contact with and (suspected) exposure to covid-19: Secondary | ICD-10-CM

## 2019-08-10 NOTE — Telephone Encounter (Signed)
Brian Mcclure called to inform staff that he has been around a COVID positive person. He cancelled his appointment for Monday and will keep [name] updated.

## 2019-08-11 LAB — NOVEL CORONAVIRUS, NAA: SARS-CoV-2, NAA: NOT DETECTED

## 2019-08-15 ENCOUNTER — Encounter: Payer: Self-pay | Admitting: *Deleted

## 2019-08-15 DIAGNOSIS — I213 ST elevation (STEMI) myocardial infarction of unspecified site: Secondary | ICD-10-CM

## 2019-08-16 ENCOUNTER — Other Ambulatory Visit: Payer: Self-pay

## 2019-08-16 ENCOUNTER — Encounter: Payer: Self-pay | Admitting: *Deleted

## 2019-08-16 ENCOUNTER — Ambulatory Visit (INDEPENDENT_AMBULATORY_CARE_PROVIDER_SITE_OTHER): Payer: No Typology Code available for payment source

## 2019-08-16 DIAGNOSIS — Z5181 Encounter for therapeutic drug level monitoring: Secondary | ICD-10-CM

## 2019-08-16 DIAGNOSIS — I236 Thrombosis of atrium, auricular appendage, and ventricle as current complications following acute myocardial infarction: Secondary | ICD-10-CM | POA: Diagnosis not present

## 2019-08-16 DIAGNOSIS — I480 Paroxysmal atrial fibrillation: Secondary | ICD-10-CM

## 2019-08-16 DIAGNOSIS — I213 ST elevation (STEMI) myocardial infarction of unspecified site: Secondary | ICD-10-CM

## 2019-08-16 LAB — POCT INR: INR: 2.6 (ref 2.0–3.0)

## 2019-08-16 MED ORDER — WARFARIN SODIUM 5 MG PO TABS: ORAL_TABLET | ORAL | 0 refills | Status: DC

## 2019-08-16 NOTE — Progress Notes (Signed)
Cardiac Individual Treatment Plan  Patient Details  Name: Brian Mcclure MRN: [id number] Date of Birth: 09-10-1955 Referring Provider:     Cardiac Rehab from 05/01/2019 in [facility] Cardiac and Pulmonary Rehab  Referring Provider  Lynda Rainwater, MD      Initial Encounter Date:    Cardiac Rehab from 05/01/2019 in [facility] Cardiac and Pulmonary Rehab  Date  05/01/19      Visit Diagnosis: ST elevation myocardial infarction (STEMI), unspecified artery ([place])  Patient's Home Medications on Admission:  Current Outpatient Medications:  .  Ca Carbonate-Mag Hydroxide (ROLAIDS PO), Take 1 tablet by mouth at bedtime as needed (heartburn/acid reflux)., Disp: , Rfl:  .  cholecalciferol (VITAMIN D) 25 MCG (1000 UT) tablet, Take 1,000 Units by mouth 2 (two) times daily., Disp: , Rfl:  .  clopidogrel (PLAVIX) 75 MG tablet, Take 1 tablet (75 mg total) by mouth daily., Disp: 30 tablet, Rfl: 1 .  diclofenac sodium (VOLTAREN) 1 % GEL, Apply 1 application topically daily as needed (pain)., Disp: , Rfl:  .  famotidine (PEPCID) 20 MG tablet, Take 20 mg by mouth daily. , Disp: , Rfl:  .  losartan (COZAAR) 25 MG tablet, Take 0.5 tablets (12.5 mg total) by mouth daily., Disp: 15 tablet, Rfl: 1 .  metoprolol succinate (TOPROL XL) 25 MG 24 hr tablet, Take 0.5 tablets (12.5 mg total) by mouth daily., Disp: 45 tablet, Rfl: 3 .  nitroGLYCERIN (NITROSTAT) 0.4 MG SL tablet, Place 1 tablet (0.4 mg total) under the tongue every 5 (five) minutes x 3 doses as needed for chest pain., Disp: 25 tablet, Rfl: 12 .  oxymetazoline (AFRIN) 0.05 % nasal spray, Place 1 spray into both nostrils 2 (two) times daily as needed for congestion., Disp: , Rfl:  .  rosuvastatin (CRESTOR) 40 MG tablet, Take 1 tablet (40 mg total) by mouth at bedtime., Disp: 30 tablet, Rfl: 1 .  Simethicone (GAS-X PO), Take 1 tablet by mouth 3 (three) times daily as needed (heartburn)., Disp: , Rfl:  .  warfarin (COUMADIN) 5 MG tablet, Take as directed by the  anti-coag clinic., Disp: 45 tablet, Rfl: 0  Past Medical History: Past Medical History:  Diagnosis Date  . Actinic keratosis   . Acute ST elevation myocardial infarction (STEMI) of anterior wall ([place])    a. 02/2019 s/p PCI/DES ->LAD.  Marland Kitchen Apical mural thrombus following MI ([place])    a. 02/2019 s/p Ant MI-->Echo: Small, fixed thrombus on the apical wall of LV-->coumadin.  . Barrett's esophagus   . CAD (coronary artery disease)    a. 02/2019 Ant STEMI/PCI: LM nl, LAD 100p (4x18 Resolute Onyx DES), LCX/RCA min irregs, EF 35-45%.  . Chronic back pain   . Colon polyps    a. 07/2017 Colonoscopy: 22m sigmoid polyp, tubular adenoma, diverticulosis, int hemorrhoids.  .Marland KitchenCOPD (chronic obstructive pulmonary disease) ([place]   . Degeneration of lumbar intervertebral disc   . Dysphonia   . GERD (gastroesophageal reflux disease)   . HFrEF (heart failure with reduced ejection fraction) ([place]    a. 02/2019 Echo: EF 40-45%, apical, septal, inf, and inforlateral AK. Mid and apical anterior/mid anteroseptal AK. Mildly dil LA.  .Marland KitchenHydrocele   . Hyperlipidemia   . Ischemic cardiomyopathy    a. 02/2019 Echo: EF 40-45%.  . Myalgia   . Osteoarthritis    C-spine  . PAF (paroxysmal atrial fibrillation) ([facility])    a. CHA2DS2VASc = 2.  . Peripheral vascular disease ([place]    a. 2018 s/p aorto-iliac stent  graft.  . Reactive airway disease   . Seborrheic keratosis   . Sinus bradycardia    a. 02/2019 Jxnl rhythm and sinus brady in setting of Ant MI-->[facility] d/c'd.  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  . Tobacco use disorder   . Tremor     Tobacco Use: Social History   Tobacco Use  Smoking Status Current Every Day Smoker  . Packs/day: 1.00  . Years: 40.00  . Pack years: 40.00  . Types: Cigarettes  Smokeless Tobacco Never Used    Labs: Recent Review Flowsheet Data    Labs for ITP Cardiac and Pulmonary Rehab Latest Ref Rng & Units 03/12/2019 05/03/2019   Cholestrol 100 - 199 mg/dL 104  96(L)   LDLCALC 0 - 99 mg/dL 59 46   HDL >39 mg/dL 31(L) 34(L)   Trlycerides 0 - 149 mg/dL 70 79   Hemoglobin A1c 4.8 - 5.6 % 6.0(H) -   TCO2 22 - 32 mmol/L 18(L) -       Exercise Target Goals: Exercise Program Goal: Individual exercise prescription set using results from initial 6 min walk test and THRR while considering  patient's activity barriers and safety.   Exercise Prescription Goal: Initial exercise prescription builds to 30-45 minutes a day of aerobic activity, 2-3 days per week.  Home exercise guidelines will be given to patient during program as part of exercise prescription that the participant will acknowledge.  Activity Barriers & Risk Stratification: Activity Barriers & Cardiac Risk Stratification - 05/01/19 1017      Activity Barriers & Cardiac Risk Stratification   Activity Barriers  Joint Problems;Back Problems;Muscular Weakness;Deconditioning;Other (comment)    Comments  claudication pain    Cardiac Risk Stratification  High       6 Minute Walk: 6 Minute Walk    Row Name 05/01/19 1017         6 Minute Walk   Phase  Initial     Distance  1765 feet     Walk Time  6 minutes     # of Rest Breaks  0     MPH  3.34     METS  4.37     RPE  10     Perceived Dyspnea   1     VO2 Peak  15.3     Symptoms  Yes (comment)     Comments  claudication pain 3/5, SOB     Resting HR  52 bpm     Resting BP  118/64     Resting Oxygen Saturation   99 %     Exercise Oxygen Saturation  during 6 min walk  100 %     Max Ex. HR  92 bpm     Max Ex. BP  132/74     2 Minute Post BP  124/62        Oxygen Initial Assessment:   Oxygen Re-Evaluation:   Oxygen Discharge (Final Oxygen Re-Evaluation):   Initial Exercise Prescription: Initial Exercise Prescription - 05/01/19 1000      Date of Initial Exercise RX and Referring Provider   Date  05/01/19    Referring Provider  Lynda Rainwater, MD      Treadmill   MPH  3    Grade  1    Minutes  15    METs  3.71       Recumbant Bike   Level  3    RPM  50  Watts  63    Minutes  15    METs  3      NuStep   Level  4    SPM  80    Minutes  15    METs  3      Prescription Details   Frequency (times per week)  2    Duration  Progress to 30 minutes of continuous aerobic without signs/symptoms of physical distress      Intensity   THRR 40-80% of Max Heartrate  94-136    Ratings of Perceived Exertion  11-13    Perceived Dyspnea  0-4      Progression   Progression  Continue to progress workloads to maintain intensity without signs/symptoms of physical distress.      Resistance Training   Training Prescription  Yes    Weight  4 lbs    Reps  10-15       Perform Capillary Blood Glucose checks as needed.  Exercise Prescription Changes: Exercise Prescription Changes    Row Name 05/01/19 1000 05/18/19 1400 05/31/19 1200 06/13/19 1400 06/28/19 1500     Response to Exercise   Blood Pressure (Admit)  118/64  122/64  120/60  110/60  128/62   Blood Pressure (Exercise)  132/74  134/64  126/60  142/70  150/60   Blood Pressure (Exit)  124/62  96/56  110/62  122/64  130/62   Heart Rate (Admit)  52 bpm  83 bpm  69 bpm  61 bpm  70 bpm   Heart Rate (Exercise)  92 bpm  117 bpm  118 bpm  121 bpm  114 bpm   Heart Rate (Exit)  -  82 bpm  77 bpm  74 bpm  70 bpm   Oxygen Saturation (Admit)  99 %  -  -  -  -   Oxygen Saturation (Exercise)  100 %  -  -  -  -   Rating of Perceived Exertion (Exercise)  10  -  '13  14  15   '$ Perceived Dyspnea (Exercise)  1  -  -  -  -   Symptoms  claudication pain 3/5, SOB  none  none  none  none   Comments  walk test results  -  -  -  -   Duration  -  Continue with 30 min of aerobic exercise without signs/symptoms of physical distress.  Continue with 30 min of aerobic exercise without signs/symptoms of physical distress.  Continue with 30 min of aerobic exercise without signs/symptoms of physical distress.  Continue with 30 min of aerobic exercise without signs/symptoms of physical  distress.   Intensity  -  THRR unchanged  THRR unchanged  THRR unchanged  THRR unchanged     Progression   Progression  -  Continue to progress workloads to maintain intensity without signs/symptoms of physical distress.  Continue to progress workloads to maintain intensity without signs/symptoms of physical distress.  Continue to progress workloads to maintain intensity without signs/symptoms of physical distress.  Continue to progress workloads to maintain intensity without signs/symptoms of physical distress.   Average METs  -  4.05  5.47  5.6  5.29     Resistance Training   Training Prescription  -  Yes  Yes  Yes  Yes   Weight  -  0 ROM only due to shoulder problem  6 lbs  6 lb  6 lb   Reps  -  10-15  10-15  10-15  10-15     Interval Training   Interval Training  -  -  No  No  No     Treadmill   MPH  -  3.5  3.5  3.5  3.5   Grade  -  '1  1  1  1   '$ Minutes  -  '15  15  15  15   '$ METs  -  4.16  4.16  4.16  4.16     Recumbant Bike   Level  -  -  '4  4  4   '$ Minutes  -  -  '15  15  15   '$ METs  -  -  5.5  5.5  -     NuStep   Level  -  -  '7  7  7   '$ Minutes  -  -  '15  15  15   '$ METs  -  -  6.8  6.6  8.7     Recumbant Elliptical   Level  -  -  -  -  4.5   Minutes  -  -  -  -  15   METs  -  -  -  -  3     T5 Nustep   Level  -  7  -  -  -   SPM  -  80  -  -  -   Minutes  -  15  -  -  -   METs  -  4  -  -  -     Home Exercise Plan   Plans to continue exercise at  -  -  Home (comment) walking  Home (comment) walking  Home (comment) walking   Frequency  -  -  Add 2 additional days to program exercise sessions.  Add 2 additional days to program exercise sessions.  Add 2 additional days to program exercise sessions.   Initial Home Exercises Provided  -  -  05/08/19  05/08/19  05/08/19   Row Name 08/09/19 1500             Response to Exercise   Blood Pressure (Admit)  130/70       Blood Pressure (Exercise)  132/58       Blood Pressure (Exit)  126/50       Heart Rate (Admit)  55  bpm       Heart Rate (Exercise)  108 bpm       Heart Rate (Exit)  89 bpm       Rating of Perceived Exertion (Exercise)  12       Symptoms  none       Duration  Continue with 30 min of aerobic exercise without signs/symptoms of physical distress.       Intensity  THRR unchanged         Progression   Progression  Continue to progress workloads to maintain intensity without signs/symptoms of physical distress.       Average METs  5         Resistance Training   Training Prescription  Yes       Weight  6 lb       Reps  10-15         Interval Training   Interval Training  No         Recumbant Bike   Level  6       Minutes  15  METs  4.36         Recumbant Elliptical   Level  10       Minutes  15       METs  6         Home Exercise Plan   Plans to continue exercise at  Home (comment) walking       Frequency  Add 2 additional days to program exercise sessions.       Initial Home Exercises Provided  05/08/19          Exercise Comments: Exercise Comments    Row Name 05/01/19 0948           Exercise Comments  First full day of exercise!  Patient was oriented to gym and equipment including functions, settings, policies, and procedures.  Patient's individual exercise prescription and treatment plan were reviewed.  All starting workloads were established based on the results of the 6 minute walk test done at initial orientation visit.  The plan for exercise progression was also introduced and progression will be customized based on patient's performance and goals.          Exercise Goals and Review: Exercise Goals    Row Name 05/01/19 1021             Exercise Goals   Increase Physical Activity  Yes       Intervention  Provide advice, education, support and counseling about physical activity/exercise needs.;Develop an individualized exercise prescription for aerobic and resistive training based on initial evaluation findings, risk stratification, comorbidities and  participant's personal goals.       Expected Outcomes  Short Term: Attend rehab on a regular basis to increase amount of physical activity.;Long Term: Add in home exercise to make exercise part of routine and to increase amount of physical activity.;Long Term: Exercising regularly at least 3-5 days a week.       Increase Strength and Stamina  Yes       Intervention  Provide advice, education, support and counseling about physical activity/exercise needs.;Develop an individualized exercise prescription for aerobic and resistive training based on initial evaluation findings, risk stratification, comorbidities and participant's personal goals.       Expected Outcomes  Short Term: Increase workloads from initial exercise prescription for resistance, speed, and METs.;Short Term: Perform resistance training exercises routinely during rehab and add in resistance training at home;Long Term: Improve cardiorespiratory fitness, muscular endurance and strength as measured by increased METs and functional capacity (6MWT)       Able to understand and use rate of perceived exertion (RPE) scale  Yes       Intervention  Provide education and explanation on how to use RPE scale       Expected Outcomes  Short Term: Able to use RPE daily in rehab to express subjective intensity level;Long Term:  Able to use RPE to guide intensity level when exercising independently       Able to understand and use Dyspnea scale  Yes       Intervention  Provide education and explanation on how to use Dyspnea scale       Expected Outcomes  Short Term: Able to use Dyspnea scale daily in rehab to express subjective sense of shortness of breath during exertion;Long Term: Able to use Dyspnea scale to guide intensity level when exercising independently       Knowledge and understanding of Target Heart Rate Range (THRR)  Yes       Intervention  Provide education and explanation of THRR including how the numbers were predicted and where they are  located for reference       Expected Outcomes  Short Term: Able to state/look up THRR;Short Term: Able to use daily as guideline for intensity in rehab;Long Term: Able to use THRR to govern intensity when exercising independently       Able to check pulse independently  Yes       Intervention  Provide education and demonstration on how to check pulse in carotid and radial arteries.;Review the importance of being able to check your own pulse for safety during independent exercise       Expected Outcomes  Short Term: Able to explain why pulse checking is important during independent exercise;Long Term: Able to check pulse independently and accurately       Understanding of Exercise Prescription  Yes       Intervention  Provide education, explanation, and written materials on patient's individual exercise prescription       Expected Outcomes  Short Term: Able to explain program exercise prescription;Long Term: Able to explain home exercise prescription to exercise independently       Improve claudication pain toleration; Improve walking ability  Yes       Intervention  Participate in PAD/SET Rehab 2-3 days a week, walking at home as part of exercise prescription;Attend education sessions to aid in risk factor modification and understanding of disease process       Expected Outcomes  Short Term: Improve walking distance/time to onset of claudication pain;Long Term: Improve walking ability and toleration to claudication          Exercise Goals Re-Evaluation : Exercise Goals Re-Evaluation    Row Name 05/01/19 1026 05/08/19 1022 05/10/19 0847 05/18/19 1413 05/31/19 1225     Exercise Goal Re-Evaluation   Exercise Goals Review  Increase Physical Activity;Increase Strength and Stamina;Understanding of Exercise Prescription  Increase Physical Activity;Increase Strength and Stamina;Understanding of Exercise Prescription;Able to understand and use rate of perceived exertion (RPE) scale;Knowledge and  understanding of Target Heart Rate Range (THRR);Able to check pulse independently  Understanding of Exercise Prescription;Increase Strength and Stamina;Increase Physical Activity  Increase Physical Activity;Increase Strength and Stamina;Able to understand and use rate of perceived exertion (RPE) scale;Knowledge and understanding of Target Heart Rate Range (THRR);Able to check pulse independently;Understanding of Exercise Prescription  Increase Physical Activity;Increase Strength and Stamina;Understanding of Exercise Prescription   Comments  Reviewed RPE scale, THR and program prescription with pt today.  Pt voiced understanding and was given a copy of goals to take home.  Reviewed home exercise with pt today.  Pt plans to walk and do yard work for exercise.  Reviewed THR, pulse, RPE, sign and symptoms, NTG use, and when to call 911 or MD.  Also discussed weather considerations and indoor options.  Pt voiced understanding.  Patient states that he will walk when he is done with rehab. Patient has a pulse ox to chech is heart rate and oxygen. Brian Mcclure will have home exercise when he has had a little more time to get use to rehab. Explained to patient why it is important to continue exercising when he is done with the program. He wants to be able to jog more.  Brian Mcclure tolerates exercise well and worked at around 4 METS.  Staff will work with him on getting back to jogging  Brian Mcclure has been doing well in rehab.  He is up to level 7 on the NuStep.  We will continue  to monitor his progress.   Expected Outcomes  Short: Use RPE daily to regulate intensity. Long: Follow program prescription in THR.  Short: add 1-2 days of walking at home following home exercise guidlines. Long: Become indepenedent with exercise program.  Short: increase strength and stamina. Long: be able to jog.  Short - attend consistently Long - incorporate joggging into program  Short: Walk on vacation.  Long: Continue to improve stamina.   [place] Name 06/12/19 0941  06/13/19 1437 06/28/19 1521 07/28/19 1050 08/09/19 1518     Exercise Goal Re-Evaluation   Exercise Goals Review  Increase Physical Activity;Increase Strength and Stamina;Understanding of Exercise Prescription  Increase Physical Activity;Increase Strength and Stamina;Able to understand and use rate of perceived exertion (RPE) scale;Able to understand and use Dyspnea scale;Understanding of Exercise Prescription  Increase Physical Activity;Increase Strength and Stamina;Understanding of Exercise Prescription  -  Increase Physical Activity;Increase Strength and Stamina;Able to understand and use rate of perceived exertion (RPE) scale;Able to understand and use Dyspnea scale;Knowledge and understanding of Target Heart Rate Range (THRR);Able to check pulse independently;Understanding of Exercise Prescription   Comments  Brian Mcclure continues to attend cardiac rehab regularly. He reports doing some form of exercise on all days he is not in class. He has made steady increases in his workloads in class and reports he feels he has had an increase in stamina and is able to do more at home without taking as many breaks.  Brian Mcclure was out for two weeks on vacation.  He has been able to maintain previous levels on machines  Brian Mcclure is doing well in rehab.  He was able to return to his previous workloads and should now be ready to start to move up some more or talk about intervals.  We will continue to monitor his progress.  Out since last review.  Brian Mcclure missed some time due to the holidays but has maintained levels on machines.   Expected Outcomes  Short: continue to attend cariac rehab classes regularly. Long: Continue to impove stamina and become independent with exercise program.  Short - continueto attend consistently Long - increase MET level  Short: Talk about intervals.  Long: Continue to improve stamina.  -  Short - try intervals Long:  exercise consistently when not at program sessions      Discharge Exercise Prescription (Final  Exercise Prescription Changes): Exercise Prescription Changes - 08/09/19 1500      Response to Exercise   Blood Pressure (Admit)  130/70    Blood Pressure (Exercise)  132/58    Blood Pressure (Exit)  126/50    Heart Rate (Admit)  55 bpm    Heart Rate (Exercise)  108 bpm    Heart Rate (Exit)  89 bpm    Rating of Perceived Exertion (Exercise)  12    Symptoms  none    Duration  Continue with 30 min of aerobic exercise without signs/symptoms of physical distress.    Intensity  THRR unchanged      Progression   Progression  Continue to progress workloads to maintain intensity without signs/symptoms of physical distress.    Average METs  5      Resistance Training   Training Prescription  Yes    Weight  6 lb    Reps  10-15      Interval Training   Interval Training  No      Recumbant Bike   Level  6    Minutes  15    METs  4.36  Recumbant Elliptical   Level  10    Minutes  15    METs  6      Home Exercise Plan   Plans to continue exercise at  Home (comment)   walking   Frequency  Add 2 additional days to program exercise sessions.    Initial Home Exercises Provided  05/08/19       Nutrition:  Target Goals: Understanding of nutrition guidelines, daily intake of sodium '1500mg'$ , cholesterol '200mg'$ , calories 30% from fat and 7% or less from saturated fats, daily to have 5 or more servings of fruits and vegetables.  Biometrics: Pre Biometrics - 05/01/19 1021      Pre Biometrics   Height  6' 1.5" (1.867 m)    Weight  183 lb 3.2 oz (83.1 kg)    BMI (Calculated)  23.84    Single Leg Stand  30 seconds        Nutrition Therapy Plan and Nutrition Goals: Nutrition Therapy & Goals - 04/18/19 1209      Nutrition Therapy   Diet  low Na, HH diet    Drug/Food Interactions  Coumadin/Vit K    Protein (specify units)  65g    Fiber  30 grams    Whole Grain Foods  3 servings    Saturated Fats  12 max. grams    Fruits and Vegetables  5 servings/day    Sodium  1.5 grams       Personal Nutrition Goals   Nutrition Goal  ST: eat salads 2x/week LT: feel better    Comments  Pt reports working all day doing chores. Pt reports that he sometimes does eat breakfast, but if he does he will have a bagel and cream cheese or ham biscuit. L and D is usually chicken and a starch, pt reports liking vegetables. Dessert is usually some chocolate and ~10 vanilla wafers with peanut butter. Discussed HH eating. Pt reports he thinks his diet is ok but he should eat more salad. Discussed consistency and vitaminK and medication.      Intervention Plan   Intervention  Prescribe, educate and counsel regarding individualized specific dietary modifications aiming towards targeted core components such as weight, hypertension, lipid management, diabetes, heart failure and other comorbidities.;Nutrition handout(s) given to patient.    Expected Outcomes  Short Term Goal: Understand basic principles of dietary content, such as calories, fat, sodium, cholesterol and nutrients.;Short Term Goal: A plan has been developed with personal nutrition goals set during dietitian appointment.;Long Term Goal: Adherence to prescribed nutrition plan.       Nutrition Assessments: Nutrition Assessments - 05/01/19 1023      MEDFICTS Scores   Pre Score  38       Nutrition Goals Re-Evaluation: Nutrition Goals Re-Evaluation    Row Name 05/22/19 1024 06/14/19 0952 07/31/19 1530         Goals   Nutrition Goal  ST: eat salads 2x/week LT: feel better  ST: include more vegetables - add 2x/week additional servings LT: feel better  ST: continue to include a variety of foods LT: feel better     Comment  Continue with current changes  Pt reports diet is not as good as it could be. Pt able to add salads in some of the time. Pt reports diet stayed mostly the same since we last spoke. Pt reports that he wants to get in more vegetables, but it is hard with a small household (preparing full meals), discussed easy ways to  include  vegetables into meals he is already having. Pt reported concenr for medication and vitamin K again, reiterated thagt it was about consistency and if he wanted to increase his intake of these foods he would just have to be consistent and talk to his doctor beforehand.  Pt reports eating more venison - making soup out of that with more vegetables. pt reports energy is medium - about the same as before. Pt reports strength is good.     Expected Outcome  ST: eat salads 2x/week LT: feel better  ST: include more vegetables - add 2x/week additional servings LT: feel better  ST: continue to include a variety of foods LT: feel better        Nutrition Goals Discharge (Final Nutrition Goals Re-Evaluation): Nutrition Goals Re-Evaluation - 07/31/19 1530      Goals   Nutrition Goal  ST: continue to include a variety of foods LT: feel better    Comment  Pt reports eating more venison - making soup out of that with more vegetables. pt reports energy is medium - about the same as before. Pt reports strength is good.    Expected Outcome  ST: continue to include a variety of foods LT: feel better       Psychosocial: Target Goals: Acknowledge presence or absence of significant depression and/or stress, maximize coping skills, provide positive support system. Participant is able to verbalize types and ability to use techniques and skills needed for reducing stress and depression.   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 He is hoping being here will help him get stronger and increase his stamina      Family Dynamics   Good Support System?  Yes      Barriers   Psychosocial barriers to participate in program  There are no identifiable barriers  or psychosocial needs.;The patient should benefit from training in stress management and relaxation.      Screening Interventions   Interventions  Encouraged to exercise;Program counselor consult;To provide support and resources with identified psychosocial needs    Expected Outcomes  Short Term goal: Utilizing psychosocial counselor, staff and physician to assist with identification of specific Stressors or current issues interfering with healing process. Setting desired goal for each stressor or current issue identified.;Long Term Goal: Stressors or current issues are controlled or eliminated.;Short Term goal: Identification and review with participant of any Quality of Life or Depression concerns found by scoring the questionnaire.;Long Term goal: The participant improves quality of Life and PHQ9 Scores as seen by post scores and/or verbalization of changes       Quality of Life Scores:  Quality of Life - 05/01/19 1022      Quality of Life   Select  Quality of Life      Quality of Life Scores   Health/Function Pre  23.53 %    Socioeconomic Pre  24.42 %    Psych/Spiritual Pre  27 %    Family Pre  27.1 %    GLOBAL Pre  24.97 %      Scores of 19 and below usually indicate a poorer quality of life in these areas.  A difference of  2-3 points is a  clinically meaningful difference.  A difference of 2-3 points in the total score of the Quality of Life Index has been associated with significant improvement in overall quality of life, self-image, physical symptoms, and general health in studies assessing change in quality of life.  PHQ-9: Recent Review Flowsheet Data    Depression screen [facility] 2/9 05/01/2019   Decreased Interest 0   Down, Depressed, Hopeless 0   PHQ - 2 Score 0   Altered sleeping 0   Tired, decreased energy 1   Change in appetite 1   Feeling bad or failure about yourself  0   Trouble concentrating 0   Moving slowly or fidgety/restless 0   Suicidal thoughts 0   PHQ-9  Score 2   Difficult doing work/chores Somewhat difficult     Interpretation of Total Score  Total Score Depression Severity:  1-4 = Minimal depression, 5-9 = Mild depression, 10-14 = Moderate depression, 15-19 = Moderately severe depression, 20-27 = Severe depression   Psychosocial Evaluation and Intervention: Psychosocial Evaluation - 04/17/19 1416      Psychosocial Evaluation & Interventions   Comments  Feels behind of doing things around the house and 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 He is hoping being here will help him get stronger and increase his stamina    Expected Outcomes  Short: attend [facility] on consistent basis to achieve goals. Long: develop self care habits    Continue Psychosocial Services   Follow up required by staff       Psychosocial Re-Evaluation: Psychosocial Re-Evaluation    [place] Name 05/10/19 0853 06/12/19 0959           Psychosocial Re-Evaluation   Current issues with  Current Stress Concerns  Current Stress Concerns      Comments  Patient wants to get his health back on track. He wants to do things like remodel and downsize but feels like he does not have enough time left. He feels like he is not checking off things off his bucket list. Brian Mcclure is retired and finding himself very busy and not enough time to do things around the house.  Patient stated that he has no new stress concerns and feels he is able to manage this aspect of his life. He stated that he feels his stamina is increasing and he can work on project at more of a steady pace now and take fewer breaks. He reports sleeping well at night.      Expected Outcomes  Short: continue to exercise to reduce stress. Long: maintain exercise to keep stress at a minimum.  Short: continue to exercise to reduce stress. Long: maintain exercise to keep stress at a minimum.      Interventions  Encouraged to attend Cardiac Rehabilitation for the exercise  -       Continue Psychosocial Services   Follow up required by staff  Follow up required by staff         Psychosocial Discharge (Final Psychosocial Re-Evaluation): Psychosocial Re-Evaluation - 06/12/19 0959      Psychosocial Re-Evaluation   Current issues with  Current Stress Concerns    Comments  Patient stated that he has no new stress concerns and feels he is able to manage this aspect of his life. He stated that he feels his stamina is increasing and he can work on project at more of a steady pace now and take fewer breaks. He reports sleeping well at  night.    Expected Outcomes  Short: continue to exercise to reduce stress. Long: maintain exercise to keep stress at a minimum.    Continue Psychosocial Services   Follow up required by staff       Vocational Rehabilitation: Provide vocational rehab assistance to qualifying candidates.   Vocational Rehab Evaluation & Intervention: Vocational Rehab - 04/17/19 1406      Initial Vocational Rehab Evaluation & Intervention   Assessment shows need for Vocational Rehabilitation  No       Education: Education Goals: Education classes will be provided on a variety of topics geared toward better understanding of heart health and risk factor modification. Participant will state understanding/return demonstration of topics presented as noted by education test scores.  Learning Barriers/Preferences: Learning Barriers/Preferences - 04/17/19 1406      Learning Barriers/Preferences   Learning Barriers  None    Learning Preferences  Individual Instruction       Education Topics:  AED/CPR: - Group verbal and written instruction with the use of models to demonstrate the basic use of the AED with the basic ABC's of resuscitation.   General Nutrition Guidelines/Fats and Fiber: -Group instruction provided by verbal, written material, models and posters to present the general guidelines for heart healthy nutrition. Gives an explanation and review of  dietary fats and fiber.   Controlling Sodium/Reading Food Labels: -Group verbal and written material supporting the discussion of sodium use in heart healthy nutrition. Review and explanation with models, verbal and written materials for utilization of the food label.   Exercise Physiology & General Exercise Guidelines: - Group verbal and written instruction with models to review the exercise physiology of the cardiovascular system and associated critical values. Provides general exercise guidelines with specific guidelines to those with heart or lung disease.    Aerobic Exercise & Resistance Training: - Gives group verbal and written instruction on the various components of exercise. Focuses on aerobic and resistive training programs and the benefits of this training and how to safely progress through these programs..   Flexibility, Balance, Mind/Body Relaxation: Provides group verbal/written instruction on the benefits of flexibility and balance training, including mind/body exercise modes such as yoga, pilates and tai chi.  Demonstration and skill practice provided.   Stress and Anxiety: - Provides group verbal and written instruction about the health risks of elevated stress and causes of high stress.  Discuss the correlation between heart/lung disease and anxiety and treatment options. Review healthy ways to manage with stress and anxiety.   Depression: - Provides group verbal and written instruction on the correlation between heart/lung disease and depressed mood, treatment options, and the stigmas associated with seeking treatment.   Anatomy & Physiology of the Heart: - Group verbal and written instruction and models provide basic cardiac anatomy and physiology, with the coronary electrical and arterial systems. Review of Valvular disease and Heart Failure   Cardiac Procedures: - Group verbal and written instruction to review commonly prescribed medications for heart disease.  Reviews the medication, class of the drug, and side effects. Includes the steps to properly store meds and maintain the prescription regimen. (beta blockers and nitrates)   Cardiac Medications I: - Group verbal and written instruction to review commonly prescribed medications for heart disease. Reviews the medication, class of the drug, and side effects. Includes the steps to properly store meds and maintain the prescription regimen.   Cardiac Medications II: -Group verbal and written instruction to review commonly prescribed medications for heart disease. Reviews the  medication, class of the drug, and side effects. (all other drug classes)    Go Sex-Intimacy & Heart Disease, Get SMART - Goal Setting: - Group verbal and written instruction through game format to discuss heart disease and the return to sexual intimacy. Provides group verbal and written material to discuss and apply goal setting through the application of the S.M.A.R.T. Method.   Other Matters of the Heart: - Provides group verbal, written materials and models to describe Stable Angina and Peripheral Artery. Includes description of the disease process and treatment options available to the cardiac patient.   Exercise & Equipment Safety: - Individual verbal instruction and demonstration of equipment use and safety with use of the equipment.   Infection Prevention: - Provides verbal and written material to individual with discussion of infection control including proper hand washing and proper equipment cleaning during exercise session.   Falls Prevention: - Provides verbal and written material to individual with discussion of falls prevention and safety.   Diabetes: - Individual verbal and written instruction to review signs/symptoms of diabetes, desired ranges of glucose level fasting, after meals and with exercise. Acknowledge that pre and post exercise glucose checks will be done for 3 sessions at entry of  program.   Know Your Numbers and Risk Factors: -Group verbal and written instruction about important numbers in your health.  Discussion of what are risk factors and how they play a role in the disease process.  Review of Cholesterol, Blood Pressure, Diabetes, and BMI and the role they play in your overall health.   Sleep Hygiene: -Provides group verbal and written instruction about how sleep can affect your health.  Define sleep hygiene, discuss sleep cycles and impact of sleep habits. Review good sleep hygiene tips.    Other: -Provides group and verbal instruction on various topics (see comments)   Knowledge Questionnaire Score: Knowledge Questionnaire Score - 05/01/19 1023      Knowledge Questionnaire Score   Pre Score  21/26 education focus: A&P, PAD, Nutrition, Exercise       Core Components/Risk Factors/Patient Goals at Admission: Personal Goals and Risk Factors at Admission - 05/01/19 1024      Core Components/Risk Factors/Patient Goals on Admission    Weight Management  Yes;Weight Maintenance    Intervention  Weight Management: Develop a combined nutrition and exercise program designed to reach desired caloric intake, while maintaining appropriate intake of nutrient and fiber, sodium and fats, and appropriate energy expenditure required for the weight goal.;Weight Management: Provide education and appropriate resources to help participant work on and attain dietary goals.    Admit Weight  183 lb 3.2 oz (83.1 kg)    Goal Weight: Short Term  183 lb (83 kg)    Goal Weight: Long Term  183 lb (83 kg)    Expected Outcomes  Short Term: Continue to assess and modify interventions until short term weight is achieved;Long Term: Adherence to nutrition and physical activity/exercise program aimed toward attainment of established weight goal;Weight Maintenance: Understanding of the daily nutrition guidelines, which includes 25-35% calories from fat, 7% or less cal from saturated fats, less  than '200mg'$  cholesterol, less than 1.5gm of sodium, & 5 or more servings of fruits and vegetables daily    Number of packs per day  1 pack/ day    Intervention  Assist the participant in steps to quit. Provide individualized education and counseling about committing to Tobacco Cessation, relapse prevention, and pharmacological support that can be provided by physician.;Advice worker, assist  with locating and accessing local/national Quit Smoking programs, and support quit date choice.    Expected Outcomes  Short Term: Will demonstrate readiness to quit, by selecting a quit date.;Short Term: Will quit all tobacco product use, adhering to prevention of relapse plan.;Long Term: Complete abstinence from all tobacco products for at least 12 months from quit date.    Lipids  Yes    Intervention  Provide education and support for participant on nutrition & aerobic/resistive exercise along with prescribed medications to achieve LDL '70mg'$ , HDL >'40mg'$ .    Expected Outcomes  Short Term: Participant states understanding of desired cholesterol values and is compliant with medications prescribed. Participant is following exercise prescription and nutrition guidelines.;Long Term: Cholesterol controlled with medications as prescribed, with individualized exercise RX and with personalized nutrition plan. Value goals: LDL < '70mg'$ , HDL > 40 mg.       Core Components/Risk Factors/Patient Goals Review:  Goals and Risk Factor Review    Row Name 05/10/19 0858 06/12/19 0943           Core Components/Risk Factors/Patient Goals Review   Personal Goals Review  Weight Management/Obesity;Improve shortness of breath with ADL's;Tobacco Cessation;Lipids  Weight Management/Obesity;Tobacco Cessation;Lipids;Improve shortness of breath with ADL's      Review  Brian Mcclure smokes a pack a day and does not have any interest in quitting. He is managing his weight and staying around 183 pounds. His lipids are doing well and he is  taking medication for it. He has labs done recently and he states it was below 14.  Brian Mcclure continues to maintain his weight, and take all medications as perscribed to control lipid levels. He continues to smoke a pack and day and is not interested in quitting. Patient stated that shortness of breath does not limit him so much, but more leg fatigue. He does feel that his stamina is increasing and he does not have to take as many rest breaks while doing physical work as he used to. He is now able to work at a steady pace.      Expected Outcomes  Short: continue to exercise to improve ADL'S. Long: maintain ADL independently.  Short: continue to exercise to improve ADL'S. Long: maintain ADL independently.         Core Components/Risk Factors/Patient Goals at Discharge (Final Review):  Goals and Risk Factor Review - 06/12/19 0943      Core Components/Risk Factors/Patient Goals Review   Personal Goals Review  Weight Management/Obesity;Tobacco Cessation;Lipids;Improve shortness of breath with ADL's    Review  Brian Mcclure continues to maintain his weight, and take all medications as perscribed to control lipid levels. He continues to smoke a pack and day and is not interested in quitting. Patient stated that shortness of breath does not limit him so much, but more leg fatigue. He does feel that his stamina is increasing and he does not have to take as many rest breaks while doing physical work as he used to. He is now able to work at a steady pace.    Expected Outcomes  Short: continue to exercise to improve ADL'S. Long: maintain ADL independently.       ITP Comments: ITP Comments    Row Name 04/17/19 1401 04/18/19 1211 04/18/19 1311 04/26/19 1112 05/01/19 0947   ITP Comments  Virtual Initial Oriention completed. Diagnosis can be found in [facility] 8/23. EP/RD orientation scheduled fro 9/29 at 11 am  completed RD initial eval  When patient arrived today patient reported to Nada Maclachlan, EP, that  he had not taken his  Plavix in 3 days due to being out of the medication.  The reason reported was he had not yet received his medications in the mail from the [place].  Patient reported this is the only medication he has missed.  Patient no longer taking baby aspirin as he was advised to stop Aspirin after one month.  He is also on Coumadin due to afib / flutter.  Sent secure chat message to Ronda Fairly, RN and Ignacia Bayley, NP at [facility] to request Plavix prescription be called in to [facility] in [place].  Patient has [employer] in addition to the [place]. Verlon Au, RN responded on [employer] letting me know she had called the prescription in.  Patient instructed to go pick up Plavix prescription and take medication today.  Note:  Patient did not exercise today.     Patient will return on Monday, April 24, 2019 to start Cardiac Rehab.  30 day review completed. ITP sent to [name], Medical Director of Cardiac and Pulmonary Rehab. Continue with ITP unless changes are made by physician.  Department closed starting 10/2 until further notice by infection prevention and Health at Work teams for [place].  First full day of exercise!  Patient was oriented to gym and equipment including functions, settings, policies, and procedures.  Patient's individual exercise prescription and treatment plan were reviewed.  All starting workloads were established based on the results of the 6 minute walk test done at initial orientation visit.  The plan for exercise progression was also introduced and progression will be customized based on patient's performance and goals.   Row Name 05/24/19 0637 05/31/19 1225 07/11/19 1447 07/19/19 0852 07/28/19 1049   ITP Comments  30 day review completed. Continue with ITP sent to [name], Medical Director of Cardiac and Pulmonary Rehab for review , changes as needed and signature.  Brian Mcclure is out of town for vacation for two weeks.  Brian Mcclure has not attended since 06/28/2019.  He plans  to return after New Year.  30 day review competed . ITP sent to Dr Emily Filbert for review, changes as needed and ITP approval signature  Brian Mcclure has been out since 06/28/19 and plans to return on Monday 07/31/19.   [place] Name 08/15/19 1246 08/16/19 1229         ITP Comments  Brian Mcclure called to inform staff that he has been around a COVID positive person. He cancelled his appointment for Monday and will keep [name] updated.  30 day review completed. ITP sent to [name], Medical Director of Cardiac and Pulmonary Rehab. Continue with ITP unless changes are made by physician.  Department operating under reduced schedule until further notice by request from hospital leadership.         Comments: 30 day review

## 2019-08-16 NOTE — Patient Instructions (Signed)
Please continue dosage of warfarin 2.5 mg every day. Recheck in 6 weeks.

## 2019-08-18 ENCOUNTER — Telehealth: Payer: Self-pay | Admitting: Cardiology

## 2019-08-18 MED ORDER — WARFARIN SODIUM 5 MG PO TABS: ORAL_TABLET | ORAL | 0 refills | Status: DC

## 2019-08-18 NOTE — Telephone Encounter (Signed)
If Home Health RN is calling please get Coumadin Nurse on the phone STAT  1.  Are you calling in regards to an appointment?  No   2.  Are you calling for a refill ?  Please confirm more specific dose for refill and if changing the brand is ok   3.  Are you having bleeding issues? No   4.  Do you need clearance to hold Coumadin? No    Please route to the Coumadin Clinic Pool

## 2019-08-23 ENCOUNTER — Encounter: Payer: Self-pay | Admitting: *Deleted

## 2019-08-23 DIAGNOSIS — I213 ST elevation (STEMI) myocardial infarction of unspecified site: Secondary | ICD-10-CM

## 2019-08-28 ENCOUNTER — Encounter: Payer: No Typology Code available for payment source | Attending: Internal Medicine | Admitting: *Deleted

## 2019-08-28 ENCOUNTER — Other Ambulatory Visit: Payer: Self-pay

## 2019-08-28 DIAGNOSIS — I213 ST elevation (STEMI) myocardial infarction of unspecified site: Secondary | ICD-10-CM | POA: Insufficient documentation

## 2019-08-28 NOTE — Progress Notes (Deleted)
Cardiac Individual Treatment Plan  Patient Details  Name: Brian Mcclure MRN: [id number] Date of Birth: 07/19/1956 Referring Provider:     Cardiac Rehab from 05/01/2019 in [facility] Cardiac and Pulmonary Rehab  Referring Provider  Lynda Rainwater, MD      Initial Encounter Date:    Cardiac Rehab from 05/01/2019 in [facility] Cardiac and Pulmonary Rehab  Date  05/01/19      Visit Diagnosis: ST elevation myocardial infarction (STEMI), unspecified artery ([place])  Patient's Home Medications on Admission:  Current Outpatient Medications:  .  Ca Carbonate-Mag Hydroxide (ROLAIDS PO), Take 1 tablet by mouth at bedtime as needed (heartburn/acid reflux)., Disp: , Rfl:  .  cholecalciferol (VITAMIN D) 25 MCG (1000 UT) tablet, Take 1,000 Units by mouth 2 (two) times daily., Disp: , Rfl:  .  clopidogrel (PLAVIX) 75 MG tablet, Take 1 tablet (75 mg total) by mouth daily., Disp: 30 tablet, Rfl: 1 .  diclofenac sodium (VOLTAREN) 1 % GEL, Apply 1 application topically daily as needed (pain)., Disp: , Rfl:  .  famotidine (PEPCID) 20 MG tablet, Take 20 mg by mouth daily. , Disp: , Rfl:  .  losartan (COZAAR) 25 MG tablet, Take 0.5 tablets (12.5 mg total) by mouth daily., Disp: 15 tablet, Rfl: 1 .  metoprolol succinate (TOPROL XL) 25 MG 24 hr tablet, Take 0.5 tablets (12.5 mg total) by mouth daily., Disp: 45 tablet, Rfl: 3 .  nitroGLYCERIN (NITROSTAT) 0.4 MG SL tablet, Place 1 tablet (0.4 mg total) under the tongue every 5 (five) minutes x 3 doses as needed for chest pain., Disp: 25 tablet, Rfl: 12 .  oxymetazoline (AFRIN) 0.05 % nasal spray, Place 1 spray into both nostrils 2 (two) times daily as needed for congestion., Disp: , Rfl:  .  rosuvastatin (CRESTOR) 40 MG tablet, Take 1 tablet (40 mg total) by mouth at bedtime., Disp: 30 tablet, Rfl: 1 .  Simethicone (GAS-X PO), Take 1 tablet by mouth 3 (three) times daily as needed (heartburn)., Disp: , Rfl:  .  warfarin (COUMADIN) 5 MG tablet, Take 1/2 tablet daily or as  directed by the anti-coag clinic., Disp: 50 tablet, Rfl: 0  Past Medical History: Past Medical History:  Diagnosis Date  . Actinic keratosis   . Acute ST elevation myocardial infarction (STEMI) of anterior wall ([place])    a. 02/2019 s/p PCI/DES ->LAD.  Marland Kitchen Apical mural thrombus following MI ([place])    a. 02/2019 s/p Ant MI-->Echo: Small, fixed thrombus on the apical wall of LV-->coumadin.  . Barrett's esophagus   . CAD (coronary artery disease)    a. 02/2019 Ant STEMI/PCI: LM nl, LAD 100p (4x18 Resolute Onyx DES), LCX/RCA min irregs, EF 35-45%.  . Chronic back pain   . Colon polyps    a. 07/2017 Colonoscopy: 7m sigmoid polyp, tubular adenoma, diverticulosis, int hemorrhoids.  .Marland KitchenCOPD (chronic obstructive pulmonary disease) ([place]   . Degeneration of lumbar intervertebral disc   . Dysphonia   . GERD (gastroesophageal reflux disease)   . HFrEF (heart failure with reduced ejection fraction) ([place]    a. 02/2019 Echo: EF 40-45%, apical, septal, inf, and inforlateral AK. Mid and apical anterior/mid anteroseptal AK. Mildly dil LA.  .Marland KitchenHydrocele   . Hyperlipidemia   . Ischemic cardiomyopathy    a. 02/2019 Echo: EF 40-45%.  . Myalgia   . Osteoarthritis    C-spine  . PAF (paroxysmal atrial fibrillation) (H[facility])    a. CHA2DS2VASc = 2.  . Peripheral vascular disease ([place]    a.  2018 s/p aorto-iliac stent graft.  . Reactive airway disease   . Seborrheic keratosis   . Sinus bradycardia    a. 02/2019 Jxnl rhythm and sinus brady in setting of Ant MI-->[facility] d/c'd.  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  . Tobacco use disorder   . Tremor     Tobacco Use: Social History   Tobacco Use  Smoking Status Current Every Day Smoker  . Packs/day: 1.00  . Years: 40.00  . Pack years: 40.00  . Types: Cigarettes  Smokeless Tobacco Never Used    Labs: Recent Review Flowsheet Data    Labs for ITP Cardiac and Pulmonary Rehab Latest Ref Rng & Units 03/12/2019 05/03/2019   Cholestrol 100 -  199 mg/dL 104 96(L)   LDLCALC 0 - 99 mg/dL 59 46   HDL >39 mg/dL 31(L) 34(L)   Trlycerides 0 - 149 mg/dL 70 79   Hemoglobin A1c 4.8 - 5.6 % 6.0(H) -   TCO2 22 - 32 mmol/L 18(L) -       Exercise Target Goals: Exercise Program Goal: Individual exercise prescription set using results from initial 6 min walk test and THRR while considering  patient's activity barriers and safety.   Exercise Prescription Goal: Initial exercise prescription builds to 30-45 minutes a day of aerobic activity, 2-3 days per week.  Home exercise guidelines will be given to patient during program as part of exercise prescription that the participant will acknowledge.  Activity Barriers & Risk Stratification: Activity Barriers & Cardiac Risk Stratification - 05/01/19 1017      Activity Barriers & Cardiac Risk Stratification   Activity Barriers  Joint Problems;Back Problems;Muscular Weakness;Deconditioning;Other (comment)    Comments  claudication pain    Cardiac Risk Stratification  High       6 Minute Walk: 6 Minute Walk    Row Name 05/01/19 1017         6 Minute Walk   Phase  Initial     Distance  1765 feet     Walk Time  6 minutes     # of Rest Breaks  0     MPH  3.34     METS  4.37     RPE  10     Perceived Dyspnea   1     VO2 Peak  15.3     Symptoms  Yes (comment)     Comments  claudication pain 3/5, SOB     Resting HR  52 bpm     Resting BP  118/64     Resting Oxygen Saturation   99 %     Exercise Oxygen Saturation  during 6 min walk  100 %     Max Ex. HR  92 bpm     Max Ex. BP  132/74     2 Minute Post BP  124/62        Oxygen Initial Assessment:   Oxygen Re-Evaluation:   Oxygen Discharge (Final Oxygen Re-Evaluation):   Initial Exercise Prescription: Initial Exercise Prescription - 05/01/19 1000      Date of Initial Exercise RX and Referring Provider   Date  05/01/19    Referring Provider  Lynda Rainwater, MD      Treadmill   MPH  3    Grade  1    Minutes  15    METs   3.71      Recumbant Bike   Level  3    RPM  50    Watts  63    Minutes  15    METs  3      NuStep   Level  4    SPM  80    Minutes  15    METs  3      Prescription Details   Frequency (times per week)  2    Duration  Progress to 30 minutes of continuous aerobic without signs/symptoms of physical distress      Intensity   THRR 40-80% of Max Heartrate  94-136    Ratings of Perceived Exertion  11-13    Perceived Dyspnea  0-4      Progression   Progression  Continue to progress workloads to maintain intensity without signs/symptoms of physical distress.      Resistance Training   Training Prescription  Yes    Weight  4 lbs    Reps  10-15       Perform Capillary Blood Glucose checks as needed.  Exercise Prescription Changes: Exercise Prescription Changes    Row Name 05/01/19 1000 05/18/19 1400 05/31/19 1200 06/13/19 1400 06/28/19 1500     Response to Exercise   Blood Pressure (Admit)  118/64  122/64  120/60  110/60  128/62   Blood Pressure (Exercise)  132/74  134/64  126/60  142/70  150/60   Blood Pressure (Exit)  124/62  96/56  110/62  122/64  130/62   Heart Rate (Admit)  52 bpm  83 bpm  69 bpm  61 bpm  70 bpm   Heart Rate (Exercise)  92 bpm  117 bpm  118 bpm  121 bpm  114 bpm   Heart Rate (Exit)  -  82 bpm  77 bpm  74 bpm  70 bpm   Oxygen Saturation (Admit)  99 %  -  -  -  -   Oxygen Saturation (Exercise)  100 %  -  -  -  -   Rating of Perceived Exertion (Exercise)  10  -  _0 Perceived Dyspnea (Exercise)  1  -  -  -  -   Symptoms  claudication pain 3/5, SOB  none  none  none  none   Comments  walk test results  -  -  -  -   Duration  -  Continue with 30 min of aerobic exercise without signs/symptoms of physical distress.  Continue with 30 min of aerobic exercise without signs/symptoms of physical distress.  Continue with 30 min of aerobic exercise without signs/symptoms of physical distress.  Continue with 30 min of aerobic exercise without signs/symptoms  of physical distress.   Intensity  -  THRR unchanged  THRR unchanged  THRR unchanged  THRR unchanged     Progression   Progression  -  Continue to progress workloads to maintain intensity without signs/symptoms of physical distress.  Continue to progress workloads to maintain intensity without signs/symptoms of physical distress.  Continue to progress workloads to maintain intensity without signs/symptoms of physical distress.  Continue to progress workloads to maintain intensity without signs/symptoms of physical distress.   Average METs  -  4.05  5.47  5.6  5.29     Resistance Training   Training Prescription  -  Yes  Yes  Yes  Yes   Weight  -  0 ROM only due to shoulder problem  6 lbs  6 lb  6 lb   Reps  -  10-15  10-15  10-15  10-15     Interval Training   Interval Training  -  -  No  No  No     Treadmill   MPH  -  3.5  3.5  3.5  3.5   Grade  -  _0 Minutes  -  _1 METs  -  4.16  4.16  4.16  4.16     Recumbant Bike   Level  -  -  _2 Minutes  -  -  _3 METs  -  -  5.5  5.5  -     NuStep   Level  -  -  _4 Minutes  -  -  _5 METs  -  -  6.8  6.6  8.7     Recumbant Elliptical   Level  -  -  -  -  4.5   Minutes  -  -  -  -  15   METs  -  -  -  -  3     T5 Nustep   Level  -  7  -  -  -   SPM  -  80  -  -  -   Minutes  -  15  -  -  -   METs  -  4  -  -  -     Home Exercise Plan   Plans to continue exercise at  -  -  Home (comment) walking  Home (comment) walking  Home (comment) walking   Frequency  -  -  Add 2 additional days to program exercise sessions.  Add 2 additional days to program exercise sessions.  Add 2 additional days to program exercise sessions.   Initial Home Exercises Provided  -  -  05/08/19  05/08/19  05/08/19   Row Name 08/09/19 1500             Response to Exercise   Blood Pressure (Admit)  130/70       Blood Pressure (Exercise)  132/58       Blood Pressure (Exit)  126/50       Heart Rate  (Admit)  55 bpm       Heart Rate (Exercise)  108 bpm       Heart Rate (Exit)  89 bpm       Rating of Perceived Exertion (Exercise)  12       Symptoms  none       Duration  Continue with 30 min of aerobic exercise without signs/symptoms of physical distress.       Intensity  THRR unchanged         Progression   Progression  Continue to progress workloads to maintain intensity without signs/symptoms of physical distress.       Average METs  5         Resistance Training   Training Prescription  Yes       Weight  6 lb       Reps  10-15         Interval Training   Interval Training  No         Recumbant Bike   Level  6  Minutes  15       METs  4.36         Recumbant Elliptical   Level  10       Minutes  15       METs  6         Home Exercise Plan   Plans to continue exercise at  Home (comment) walking       Frequency  Add 2 additional days to program exercise sessions.       Initial Home Exercises Provided  05/08/19          Exercise Comments: Exercise Comments    Row Name 05/01/19 0948           Exercise Comments  First full day of exercise!  Patient was oriented to gym and equipment including functions, settings, policies, and procedures.  Patient's individual exercise prescription and treatment plan were reviewed.  All starting workloads were established based on the results of the 6 minute walk test done at initial orientation visit.  The plan for exercise progression was also introduced and progression will be customized based on patient's performance and goals.          Exercise Goals and Review: Exercise Goals    Row Name 05/01/19 1021             Exercise Goals   Increase Physical Activity  Yes       Intervention  Provide advice, education, support and counseling about physical activity/exercise needs.;Develop an individualized exercise prescription for aerobic and resistive training based on initial evaluation findings, risk stratification,  comorbidities and participant's personal goals.       Expected Outcomes  Short Term: Attend rehab on a regular basis to increase amount of physical activity.;Long Term: Add in home exercise to make exercise part of routine and to increase amount of physical activity.;Long Term: Exercising regularly at least 3-5 days a week.       Increase Strength and Stamina  Yes       Intervention  Provide advice, education, support and counseling about physical activity/exercise needs.;Develop an individualized exercise prescription for aerobic and resistive training based on initial evaluation findings, risk stratification, comorbidities and participant's personal goals.       Expected Outcomes  Short Term: Increase workloads from initial exercise prescription for resistance, speed, and METs.;Short Term: Perform resistance training exercises routinely during rehab and add in resistance training at home;Long Term: Improve cardiorespiratory fitness, muscular endurance and strength as measured by increased METs and functional capacity (6MWT)       Able to understand and use rate of perceived exertion (RPE) scale  Yes       Intervention  Provide education and explanation on how to use RPE scale       Expected Outcomes  Short Term: Able to use RPE daily in rehab to express subjective intensity level;Long Term:  Able to use RPE to guide intensity level when exercising independently       Able to understand and use Dyspnea scale  Yes       Intervention  Provide education and explanation on how to use Dyspnea scale       Expected Outcomes  Short Term: Able to use Dyspnea scale daily in rehab to express subjective sense of shortness of breath during exertion;Long Term: Able to use Dyspnea scale to guide intensity level when exercising independently       Knowledge and understanding of Target Heart Rate Range (THRR)  Yes       Intervention  Provide education and explanation of THRR including how the numbers were predicted and  where they are located for reference       Expected Outcomes  Short Term: Able to state/look up THRR;Short Term: Able to use daily as guideline for intensity in rehab;Long Term: Able to use THRR to govern intensity when exercising independently       Able to check pulse independently  Yes       Intervention  Provide education and demonstration on how to check pulse in carotid and radial arteries.;Review the importance of being able to check your own pulse for safety during independent exercise       Expected Outcomes  Short Term: Able to explain why pulse checking is important during independent exercise;Long Term: Able to check pulse independently and accurately       Understanding of Exercise Prescription  Yes       Intervention  Provide education, explanation, and written materials on patient's individual exercise prescription       Expected Outcomes  Short Term: Able to explain program exercise prescription;Long Term: Able to explain home exercise prescription to exercise independently       Improve claudication pain toleration; Improve walking ability  Yes       Intervention  Participate in PAD/SET Rehab 2-3 days a week, walking at home as part of exercise prescription;Attend education sessions to aid in risk factor modification and understanding of disease process       Expected Outcomes  Short Term: Improve walking distance/time to onset of claudication pain;Long Term: Improve walking ability and toleration to claudication          Exercise Goals Re-Evaluation : Exercise Goals Re-Evaluation    Row Name 05/01/19 1026 05/08/19 1022 05/10/19 0847 05/18/19 1413 05/31/19 1225     Exercise Goal Re-Evaluation   Exercise Goals Review  Increase Physical Activity;Increase Strength and Stamina;Understanding of Exercise Prescription  Increase Physical Activity;Increase Strength and Stamina;Understanding of Exercise Prescription;Able to understand and use rate of perceived exertion (RPE) scale;Knowledge  and understanding of Target Heart Rate Range (THRR);Able to check pulse independently  Understanding of Exercise Prescription;Increase Strength and Stamina;Increase Physical Activity  Increase Physical Activity;Increase Strength and Stamina;Able to understand and use rate of perceived exertion (RPE) scale;Knowledge and understanding of Target Heart Rate Range (THRR);Able to check pulse independently;Understanding of Exercise Prescription  Increase Physical Activity;Increase Strength and Stamina;Understanding of Exercise Prescription   Comments  Reviewed RPE scale, THR and program prescription with pt today.  Pt voiced understanding and was given a copy of goals to take home.  Reviewed home exercise with pt today.  Pt plans to walk and do yard work for exercise.  Reviewed THR, pulse, RPE, sign and symptoms, NTG use, and when to call 911 or MD.  Also discussed weather considerations and indoor options.  Pt voiced understanding.  Patient states that he will walk when he is done with rehab. Patient has a pulse ox to chech is heart rate and oxygen. Brian Mcclure will have home exercise when he has had a little more time to get use to rehab. Explained to patient why it is important to continue exercising when he is done with the program. He wants to be able to jog more.  Jon tolerates exercise well and worked at around 4 METS.  Staff will work with him on getting back to jogging  Brian Mcclure has been doing well in rehab.  He is up to  level 7 on the NuStep.  We will continue to monitor his progress.   Expected Outcomes  Short: Use RPE daily to regulate intensity. Long: Follow program prescription in THR.  Short: add 1-2 days of walking at home following home exercise guidlines. Long: Become indepenedent with exercise program.  Short: increase strength and stamina. Long: be able to jog.  Short - attend consistently Long - incorporate joggging into program  Short: Walk on vacation.  Long: Continue to improve stamina.   [place] Name 06/12/19  0941 06/13/19 1437 06/28/19 1521 07/28/19 1050 08/09/19 1518     Exercise Goal Re-Evaluation   Exercise Goals Review  Increase Physical Activity;Increase Strength and Stamina;Understanding of Exercise Prescription  Increase Physical Activity;Increase Strength and Stamina;Able to understand and use rate of perceived exertion (RPE) scale;Able to understand and use Dyspnea scale;Understanding of Exercise Prescription  Increase Physical Activity;Increase Strength and Stamina;Understanding of Exercise Prescription  -  Increase Physical Activity;Increase Strength and Stamina;Able to understand and use rate of perceived exertion (RPE) scale;Able to understand and use Dyspnea scale;Knowledge and understanding of Target Heart Rate Range (THRR);Able to check pulse independently;Understanding of Exercise Prescription   Comments  Brian Mcclure continues to attend cardiac rehab regularly. He reports doing some form of exercise on all days he is not in class. He has made steady increases in his workloads in class and reports he feels he has had an increase in stamina and is able to do more at home without taking as many breaks.  Brian Mcclure was out for two weeks on vacation.  He has been able to maintain previous levels on machines  Brian Mcclure is doing well in rehab.  He was able to return to his previous workloads and should now be ready to start to move up some more or talk about intervals.  We will continue to monitor his progress.  Out since last review.  Brian Mcclure missed some time due to the holidays but has maintained levels on machines.   Expected Outcomes  Short: continue to attend cariac rehab classes regularly. Long: Continue to impove stamina and become independent with exercise program.  Short - continueto attend consistently Long - increase MET level  Short: Talk about intervals.  Long: Continue to improve stamina.  -  Short - try intervals Long:  exercise consistently when not at program sessions   Row Name 08/23/19 1219              Exercise Goal Re-Evaluation   Comments  Out since last review due to [place] exposure.          Discharge Exercise Prescription (Final Exercise Prescription Changes): Exercise Prescription Changes - 08/09/19 1500      Response to Exercise   Blood Pressure (Admit)  130/70    Blood Pressure (Exercise)  132/58    Blood Pressure (Exit)  126/50    Heart Rate (Admit)  55 bpm    Heart Rate (Exercise)  108 bpm    Heart Rate (Exit)  89 bpm    Rating of Perceived Exertion (Exercise)  12    Symptoms  none    Duration  Continue with 30 min of aerobic exercise without signs/symptoms of physical distress.    Intensity  THRR unchanged      Progression   Progression  Continue to progress workloads to maintain intensity without signs/symptoms of physical distress.    Average METs  5      Resistance Training   Training Prescription  Yes    Weight  6 lb    Reps  10-15      Interval Training   Interval Training  No      Recumbant Bike   Level  6    Minutes  15    METs  4.36      Recumbant Elliptical   Level  10    Minutes  15    METs  6      Home Exercise Plan   Plans to continue exercise at  Home (comment)   walking   Frequency  Add 2 additional days to program exercise sessions.    Initial Home Exercises Provided  05/08/19       Nutrition:  Target Goals: Understanding of nutrition guidelines, daily intake of sodium <1568m, cholesterol <2055m calories 30% from fat and 7% or less from saturated fats, daily to have 5 or more servings of fruits and vegetables.  Biometrics: Pre Biometrics - 05/01/19 1021      Pre Biometrics   Height  6' 1.5" (1.867 m)    Weight  183 lb 3.2 oz (83.1 kg)    BMI (Calculated)  23.84    Single Leg Stand  30 seconds        Nutrition Therapy Plan and Nutrition Goals: Nutrition Therapy & Goals - 04/18/19 1209      Nutrition Therapy   Diet  low Na, HH diet    Drug/Food Interactions  Coumadin/Vit K    Protein (specify units)  65g    Fiber  30  grams    Whole Grain Foods  3 servings    Saturated Fats  12 max. grams    Fruits and Vegetables  5 servings/day    Sodium  1.5 grams      Personal Nutrition Goals   Nutrition Goal  ST: eat salads 2x/week LT: feel better    Comments  Pt reports working all day doing chores. Pt reports that he sometimes does eat breakfast, but if he does he will have a bagel and cream cheese or ham biscuit. L and D is usually chicken and a starch, pt reports liking vegetables. Dessert is usually some chocolate and ~10 vanilla wafers with peanut butter. Discussed HH eating. Pt reports he thinks his diet is ok but he should eat more salad. Discussed consistency and vitaminK and medication.      Intervention Plan   Intervention  Prescribe, educate and counsel regarding individualized specific dietary modifications aiming towards targeted core components such as weight, hypertension, lipid management, diabetes, heart failure and other comorbidities.;Nutrition handout(s) given to patient.    Expected Outcomes  Short Term Goal: Understand basic principles of dietary content, such as calories, fat, sodium, cholesterol and nutrients.;Short Term Goal: A plan has been developed with personal nutrition goals set during dietitian appointment.;Long Term Goal: Adherence to prescribed nutrition plan.       Nutrition Assessments: Nutrition Assessments - 05/01/19 1023      MEDFICTS Scores   Pre Score  38       Nutrition Goals Re-Evaluation: Nutrition Goals Re-Evaluation    Row Name 05/22/19 1024 06/14/19 0952 07/31/19 1530         Goals   Nutrition Goal  ST: eat salads 2x/week LT: feel better  ST: include more vegetables - add 2x/week additional servings LT: feel better  ST: continue to include a variety of foods LT: feel better     Comment  Continue with current changes  Pt reports diet is not as good as it  could be. Pt able to add salads in some of the time. Pt reports diet stayed mostly the same since we last spoke.  Pt reports that he wants to get in more vegetables, but it is hard with a small household (preparing full meals), discussed easy ways to include vegetables into meals he is already having. Pt reported concenr for medication and vitamin K again, reiterated thagt it was about consistency and if he wanted to increase his intake of these foods he would just have to be consistent and talk to his doctor beforehand.  Pt reports eating more venison - making soup out of that with more vegetables. pt reports energy is medium - about the same as before. Pt reports strength is good.     Expected Outcome  ST: eat salads 2x/week LT: feel better  ST: include more vegetables - add 2x/week additional servings LT: feel better  ST: continue to include a variety of foods LT: feel better        Nutrition Goals Discharge (Final Nutrition Goals Re-Evaluation): Nutrition Goals Re-Evaluation - 07/31/19 1530      Goals   Nutrition Goal  ST: continue to include a variety of foods LT: feel better    Comment  Pt reports eating more venison - making soup out of that with more vegetables. pt reports energy is medium - about the same as before. Pt reports strength is good.    Expected Outcome  ST: continue to include a variety of foods LT: feel better       Psychosocial: Target Goals: Acknowledge presence or absence of significant depression and/or stress, maximize coping skills, provide positive support system. Participant is able to verbalize types and ability to use techniques and skills needed for reducing stress and depression.   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 He is hoping being here will help him get stronger and increase his stamina      Family  Dynamics   Good Support System?  Yes      Barriers   Psychosocial barriers to participate in program  There are no identifiable barriers or psychosocial needs.;The patient should benefit from training in stress management and relaxation.      Screening Interventions   Interventions  Encouraged to exercise;Program counselor consult;To provide support and resources with identified psychosocial needs    Expected Outcomes  Short Term goal: Utilizing psychosocial counselor, staff and physician to assist with identification of specific Stressors or current issues interfering with healing process. Setting desired goal for each stressor or current issue identified.;Long Term Goal: Stressors or current issues are controlled or eliminated.;Short Term goal: Identification and review with participant of any Quality of Life or Depression concerns found by scoring the questionnaire.;Long Term goal: The participant improves quality of Life and PHQ9 Scores as seen by post scores and/or verbalization of changes       Quality of Life Scores:  Quality of Life - 05/01/19 1022      Quality of Life   Select  Quality of Life      Quality of Life Scores   Health/Function Pre  23.53 %    Socioeconomic Pre  24.42 %  Psych/Spiritual Pre  27 %    Family Pre  27.1 %    GLOBAL Pre  24.97 %      Scores of 19 and below usually indicate a poorer quality of life in these areas.  A difference of  2-3 points is a clinically meaningful difference.  A difference of 2-3 points in the total score of the Quality of Life Index has been associated with significant improvement in overall quality of life, self-image, physical symptoms, and general health in studies assessing change in quality of life.  PHQ-9: Recent Review Flowsheet Data    Depression screen [facility] 2/9 05/01/2019   Decreased Interest 0   Down, Depressed, Hopeless 0   PHQ - 2 Score 0   Altered sleeping 0   Tired, decreased energy 1   Change in appetite 1    Feeling bad or failure about yourself  0   Trouble concentrating 0   Moving slowly or fidgety/restless 0   Suicidal thoughts 0   PHQ-9 Score 2   Difficult doing work/chores Somewhat difficult     Interpretation of Total Score  Total Score Depression Severity:  1-4 = Minimal depression, 5-9 = Mild depression, 10-14 = Moderate depression, 15-19 = Moderately severe depression, 20-27 = Severe depression   Psychosocial Evaluation and Intervention: Psychosocial Evaluation - 04/17/19 1416      Psychosocial Evaluation & Interventions   Comments  Feels behind of doing things around the house and 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 He is hoping being here will help him get stronger and increase his stamina    Expected Outcomes  Short: attend [facility] on consistent basis to achieve goals. Long: develop self care habits    Continue Psychosocial Services   Follow up required by staff       Psychosocial Re-Evaluation: Psychosocial Re-Evaluation    Tool Name 05/10/19 0853 06/12/19 0959           Psychosocial Re-Evaluation   Current issues with  Current Stress Concerns  Current Stress Concerns      Comments  Patient wants to get his health back on track. He wants to do things like remodel and downsize but feels like he does not have enough time left. He feels like he is not checking off things off his bucket list. Jenny Reichmann is retired and finding himself very busy and not enough time to do things around the house.  Patient stated that he has no new stress concerns and feels he is able to manage this aspect of his life. He stated that he feels his stamina is increasing and he can work on project at more of a steady pace now and take fewer breaks. He reports sleeping well at night.      Expected Outcomes  Short: continue to exercise to reduce stress. Long: maintain exercise to keep stress at a minimum.  Short: continue to exercise to reduce stress. Long:  maintain exercise to keep stress at a minimum.      Interventions  Encouraged to attend Cardiac Rehabilitation for the exercise  -      Continue Psychosocial Services   Follow up required by staff  Follow up required by staff         Psychosocial Discharge (Final Psychosocial Re-Evaluation): Psychosocial Re-Evaluation - 06/12/19 0959      Psychosocial Re-Evaluation   Current issues with  Current Stress Concerns    Comments  Patient stated  that he has no new stress concerns and feels he is able to manage this aspect of his life. He stated that he feels his stamina is increasing and he can work on project at more of a steady pace now and take fewer breaks. He reports sleeping well at night.    Expected Outcomes  Short: continue to exercise to reduce stress. Long: maintain exercise to keep stress at a minimum.    Continue Psychosocial Services   Follow up required by staff       Vocational Rehabilitation: Provide vocational rehab assistance to qualifying candidates.   Vocational Rehab Evaluation & Intervention: Vocational Rehab - 04/17/19 1406      Initial Vocational Rehab Evaluation & Intervention   Assessment shows need for Vocational Rehabilitation  No       Education: Education Goals: Education classes will be provided on a variety of topics geared toward better understanding of heart health and risk factor modification. Participant will state understanding/return demonstration of topics presented as noted by education test scores.  Learning Barriers/Preferences: Learning Barriers/Preferences - 04/17/19 1406      Learning Barriers/Preferences   Learning Barriers  None    Learning Preferences  Individual Instruction       Education Topics:  AED/CPR: - Group verbal and written instruction with the use of models to demonstrate the basic use of the AED with the basic ABC's of resuscitation.   General Nutrition Guidelines/Fats and Fiber: -Group instruction provided by  verbal, written material, models and posters to present the general guidelines for heart healthy nutrition. Gives an explanation and review of dietary fats and fiber.   Controlling Sodium/Reading Food Labels: -Group verbal and written material supporting the discussion of sodium use in heart healthy nutrition. Review and explanation with models, verbal and written materials for utilization of the food label.   Exercise Physiology & General Exercise Guidelines: - Group verbal and written instruction with models to review the exercise physiology of the cardiovascular system and associated critical values. Provides general exercise guidelines with specific guidelines to those with heart or lung disease.    Aerobic Exercise & Resistance Training: - Gives group verbal and written instruction on the various components of exercise. Focuses on aerobic and resistive training programs and the benefits of this training and how to safely progress through these programs..   Flexibility, Balance, Mind/Body Relaxation: Provides group verbal/written instruction on the benefits of flexibility and balance training, including mind/body exercise modes such as yoga, pilates and tai chi.  Demonstration and skill practice provided.   Stress and Anxiety: - Provides group verbal and written instruction about the health risks of elevated stress and causes of high stress.  Discuss the correlation between heart/lung disease and anxiety and treatment options. Review healthy ways to manage with stress and anxiety.   Depression: - Provides group verbal and written instruction on the correlation between heart/lung disease and depressed mood, treatment options, and the stigmas associated with seeking treatment.   Anatomy & Physiology of the Heart: - Group verbal and written instruction and models provide basic cardiac anatomy and physiology, with the coronary electrical and arterial systems. Review of Valvular disease  and Heart Failure   Cardiac Procedures: - Group verbal and written instruction to review commonly prescribed medications for heart disease. Reviews the medication, class of the drug, and side effects. Includes the steps to properly store meds and maintain the prescription regimen. (beta blockers and nitrates)   Cardiac Medications I: - Group verbal and written instruction  to review commonly prescribed medications for heart disease. Reviews the medication, class of the drug, and side effects. Includes the steps to properly store meds and maintain the prescription regimen.   Cardiac Medications II: -Group verbal and written instruction to review commonly prescribed medications for heart disease. Reviews the medication, class of the drug, and side effects. (all other drug classes)    Go Sex-Intimacy & Heart Disease, Get SMART - Goal Setting: - Group verbal and written instruction through game format to discuss heart disease and the return to sexual intimacy. Provides group verbal and written material to discuss and apply goal setting through the application of the S.M.A.R.T. Method.   Other Matters of the Heart: - Provides group verbal, written materials and models to describe Stable Angina and Peripheral Artery. Includes description of the disease process and treatment options available to the cardiac patient.   Exercise & Equipment Safety: - Individual verbal instruction and demonstration of equipment use and safety with use of the equipment.   Infection Prevention: - Provides verbal and written material to individual with discussion of infection control including proper hand washing and proper equipment cleaning during exercise session.   Falls Prevention: - Provides verbal and written material to individual with discussion of falls prevention and safety.   Diabetes: - Individual verbal and written instruction to review signs/symptoms of diabetes, desired ranges of glucose level  fasting, after meals and with exercise. Acknowledge that pre and post exercise glucose checks will be done for 3 sessions at entry of program.   Know Your Numbers and Risk Factors: -Group verbal and written instruction about important numbers in your health.  Discussion of what are risk factors and how they play a role in the disease process.  Review of Cholesterol, Blood Pressure, Diabetes, and BMI and the role they play in your overall health.   Sleep Hygiene: -Provides group verbal and written instruction about how sleep can affect your health.  Define sleep hygiene, discuss sleep cycles and impact of sleep habits. Review good sleep hygiene tips.    Other: -Provides group and verbal instruction on various topics (see comments)   Knowledge Questionnaire Score: Knowledge Questionnaire Score - 05/01/19 1023      Knowledge Questionnaire Score   Pre Score  21/26 education focus: A&P, PAD, Nutrition, Exercise       Core Components/Risk Factors/Patient Goals at Admission: Personal Goals and Risk Factors at Admission - 05/01/19 1024      Core Components/Risk Factors/Patient Goals on Admission    Weight Management  Yes;Weight Maintenance    Intervention  Weight Management: Develop a combined nutrition and exercise program designed to reach desired caloric intake, while maintaining appropriate intake of nutrient and fiber, sodium and fats, and appropriate energy expenditure required for the weight goal.;Weight Management: Provide education and appropriate resources to help participant work on and attain dietary goals.    Admit Weight  183 lb 3.2 oz (83.1 kg)    Goal Weight: Short Term  183 lb (83 kg)    Goal Weight: Long Term  183 lb (83 kg)    Expected Outcomes  Short Term: Continue to assess and modify interventions until short term weight is achieved;Long Term: Adherence to nutrition and physical activity/exercise program aimed toward attainment of established weight goal;Weight  Maintenance: Understanding of the daily nutrition guidelines, which includes 25-35% calories from fat, 7% or less cal from saturated fats, less than 264m cholesterol, less than 1.5gm of sodium, & 5 or more servings of fruits and  vegetables daily    Number of packs per day  1 pack/ day    Intervention  Assist the participant in steps to quit. Provide individualized education and counseling about committing to Tobacco Cessation, relapse prevention, and pharmacological support that can be provided by physician.;Advice worker, assist with locating and accessing local/national Quit Smoking programs, and support quit date choice.    Expected Outcomes  Short Term: Will demonstrate readiness to quit, by selecting a quit date.;Short Term: Will quit all tobacco product use, adhering to prevention of relapse plan.;Long Term: Complete abstinence from all tobacco products for at least 12 months from quit date.    Lipids  Yes    Intervention  Provide education and support for participant on nutrition & aerobic/resistive exercise along with prescribed medications to achieve LDL <30m, HDL >465m    Expected Outcomes  Short Term: Participant states understanding of desired cholesterol values and is compliant with medications prescribed. Participant is following exercise prescription and nutrition guidelines.;Long Term: Cholesterol controlled with medications as prescribed, with individualized exercise RX and with personalized nutrition plan. Value goals: LDL < 7051mHDL > 40 mg.       Core Components/Risk Factors/Patient Goals Review:  Goals and Risk Factor Review    Row Name 05/10/19 0858 06/12/19 0943           Core Components/Risk Factors/Patient Goals Review   Personal Goals Review  Weight Management/Obesity;Improve shortness of breath with ADL's;Tobacco Cessation;Lipids  Weight Management/Obesity;Tobacco Cessation;Lipids;Improve shortness of breath with ADL's      Review  Brian Mcclure smokes a pack  a day and does not have any interest in quitting. He is managing his weight and staying around 183 pounds. His lipids are doing well and he is taking medication for it. He has labs done recently and he states it was below 70.29John continues to maintain his weight, and take all medications as perscribed to control lipid levels. He continues to smoke a pack and day and is not interested in quitting. Patient stated that shortness of breath does not limit him so much, but more leg fatigue. He does feel that his stamina is increasing and he does not have to take as many rest breaks while doing physical work as he used to. He is now able to work at a steady pace.      Expected Outcomes  Short: continue to exercise to improve ADL'S. Long: maintain ADL independently.  Short: continue to exercise to improve ADL'S. Long: maintain ADL independently.         Core Components/Risk Factors/Patient Goals at Discharge (Final Review):  Goals and Risk Factor Review - 06/12/19 0943      Core Components/Risk Factors/Patient Goals Review   Personal Goals Review  Weight Management/Obesity;Tobacco Cessation;Lipids;Improve shortness of breath with ADL's    Review  JohJenny Reichmannntinues to maintain his weight, and take all medications as perscribed to control lipid levels. He continues to smoke a pack and day and is not interested in quitting. Patient stated that shortness of breath does not limit him so much, but more leg fatigue. He does feel that his stamina is increasing and he does not have to take as many rest breaks while doing physical work as he used to. He is now able to work at a steady pace.    Expected Outcomes  Short: continue to exercise to improve ADL'S. Long: maintain ADL independently.       ITP Comments: ITP Comments    Row Name 04/17/19  1401 04/18/19 1211 04/18/19 1311 04/26/19 1112 05/01/19 0947   ITP Comments  Virtual Initial Oriention completed. Diagnosis can be found in [facility] 8/23. EP/RD orientation  scheduled fro 9/29 at 11 am  completed RD initial eval  When patient arrived today patient reported to Nada Maclachlan, EP, that he had not taken his Plavix in 3 days due to being out of the medication.  The reason reported was he had not yet received his medications in the mail from the [place].  Patient reported this is the only medication he has missed.  Patient no longer taking baby aspirin as he was advised to stop Aspirin after one month.  He is also on Coumadin due to afib / flutter.  Sent secure chat message to Ronda Fairly, RN and Ignacia Bayley, NP at [facility] to request Plavix prescription be called in to [facility] in [place].  Patient has [employer] in addition to the [place]. Verlon Au, RN responded on [employer] letting me know she had called the prescription in.  Patient instructed to go pick up Plavix prescription and take medication today.  Note:  Patient did not exercise today.     Patient will return on Monday, April 24, 2019 to start Cardiac Rehab.  30 day review completed. ITP sent to [name], Medical Director of Cardiac and Pulmonary Rehab. Continue with ITP unless changes are made by physician.  Department closed starting 10/2 until further notice by infection prevention and Health at Work teams for [place].  First full day of exercise!  Patient was oriented to gym and equipment including functions, settings, policies, and procedures.  Patient's individual exercise prescription and treatment plan were reviewed.  All starting workloads were established based on the results of the 6 minute walk test done at initial orientation visit.  The plan for exercise progression was also introduced and progression will be customized based on patient's performance and goals.   Row Name 05/24/19 0637 05/31/19 1225 07/11/19 1447 07/19/19 0852 07/28/19 1049   ITP Comments  30 day review completed. Continue with ITP sent to [name], Medical Director of Cardiac and Pulmonary  Rehab for review , changes as needed and signature.  Brian Mcclure is out of town for vacation for two weeks.  Brian Mcclure has not attended since 06/28/2019.  He plans to return after New Year.  30 day review competed . ITP sent to Dr Emily Filbert for review, changes as needed and ITP approval signature  Brian Mcclure has been out since 06/28/19 and plans to return on Monday 07/31/19.   [place] Name 08/15/19 1246 08/16/19 1229         ITP Comments  Mr. Fallin called to inform staff that he has been around a COVID positive person. He cancelled his appointment for Monday and will keep [name] updated.  30 day review completed. ITP sent to [name], Medical Director of Cardiac and Pulmonary Rehab. Continue with ITP unless changes are made by physician.  Department operating under reduced schedule until further notice by request from hospital leadership.         Comments: Pt able to follow exercise prescription today without complaint.  Will continue to monitor for progression.

## 2019-08-28 NOTE — Progress Notes (Signed)
Daily Session Note  Patient Details  Name: Brian Mcclure MRN: [id number] Date of Birth: 1956/01/23 Referring Provider:     Cardiac Rehab from 05/01/2019 in [facility] Cardiac and Pulmonary Rehab  Referring Provider  Lynda Rainwater, MD      Encounter Date: 08/28/2019  Check In: Session Check In - 08/28/19 1517      Check-In   Supervising physician immediately available to respond to emergencies  See telemetry face sheet for immediately available ER MD    Location  [facility]-Cardiac & Pulmonary Rehab    Staff Present  Renita Papa, RN Moises Blood, BS, ACSM CEP, Exercise Physiologist;Joseph Tessie Fass RCP,RRT,BSRT    Virtual Visit  No    Medication changes reported      No    Warm-up and Cool-down  Performed on first and last piece of equipment    Resistance Training Performed  Yes    VAD Patient?  No    PAD/SET Patient?  No      Pain Assessment   Currently in Pain?  No/denies          Social History   Tobacco Use  Smoking Status Current Every Day Smoker  . Packs/day: 1.00  . Years: 40.00  . Pack years: 40.00  . Types: Cigarettes  Smokeless Tobacco Never Used    Goals Met:  Independence with exercise equipment Exercise tolerated well No report of cardiac concerns or symptoms Strength training completed today  Goals Unmet:  Not Applicable  Comments: Pt able to follow exercise prescription today without complaint.  Will continue to monitor for progression.    [name] is Medical Director for [place] and [name].

## 2019-08-29 ENCOUNTER — Other Ambulatory Visit: Payer: Self-pay

## 2019-08-29 ENCOUNTER — Encounter: Payer: No Typology Code available for payment source | Admitting: *Deleted

## 2019-08-29 DIAGNOSIS — I213 ST elevation (STEMI) myocardial infarction of unspecified site: Secondary | ICD-10-CM

## 2019-08-29 NOTE — Progress Notes (Signed)
Daily Session Note  Patient Details  Name: Brian Mcclure MRN: [id number] Date of Birth: August 19, 1955 Referring Provider:     Cardiac Rehab from 05/01/2019 in [facility] Cardiac and Pulmonary Rehab  Referring Provider  Lynda Rainwater, MD      Encounter Date: 08/29/2019  Check In: Session Check In - 08/29/19 1114      Check-In   Supervising physician immediately available to respond to emergencies  See telemetry face sheet for immediately available ER MD    Location  [facility]-Cardiac & Pulmonary Rehab    Staff Present  Alberteen Sam, MA, RCEP, CCRP, CCET;Melissa Caiola RDN, LDN;Liset Mcmonigle Sherryll Burger, RN BSN    Virtual Visit  No    Medication changes reported      No    Fall or balance concerns reported     No    Warm-up and Cool-down  Performed on first and last piece of equipment    Resistance Training Performed  Yes    VAD Patient?  No    PAD/SET Patient?  No      Pain Assessment   Currently in Pain?  No/denies          Social History   Tobacco Use  Smoking Status Current Every Day Smoker  . Packs/day: 1.00  . Years: 40.00  . Pack years: 40.00  . Types: Cigarettes  Smokeless Tobacco Never Used    Goals Met:  Independence with exercise equipment Exercise tolerated well No report of cardiac concerns or symptoms Strength training completed today  Goals Unmet:  Not Applicable  Comments: Pt able to follow exercise prescription today without complaint.  Will continue to monitor for progression.    [name] is Medical Director for [place] and [name].

## 2019-09-05 ENCOUNTER — Encounter: Payer: No Typology Code available for payment source | Admitting: *Deleted

## 2019-09-05 ENCOUNTER — Other Ambulatory Visit: Payer: Self-pay

## 2019-09-05 DIAGNOSIS — I213 ST elevation (STEMI) myocardial infarction of unspecified site: Secondary | ICD-10-CM

## 2019-09-05 NOTE — Progress Notes (Signed)
Daily Session Note  Patient Details  Name: Brian Mcclure MRN: [id number] Date of Birth: [birth year] Referring Provider:     Cardiac Rehab from 05/01/2019 in [facility]  Referring Provider  Lynda Rainwater, MD      Encounter Date: 09/05/2019  Check In: Session Check In - 09/05/19 1150      Check-In   Supervising physician immediately available to respond to emergencies  See telemetry face sheet for immediately available ER MD    Location  [facility]-Cardiac & Pulmonary Rehab    Staff Present  Hope Budds RDN, LDN;Jessica Luan Pulling, MA, RCEP, CCRP, CCET;Chelcy Bolda Sherryll Burger, RN BSN    Virtual Visit  No    Medication changes reported      No    Fall or balance concerns reported     No    Warm-up and Cool-down  Performed on first and last piece of equipment    Resistance Training Performed  Yes    VAD Patient?  No    PAD/SET Patient?  No      Pain Assessment   Currently in Pain?  No/denies          Social History   Tobacco Use  Smoking Status Current Every Day Smoker  . Packs/day: 1.00  . Years: 40.00  . Pack years: 40.00  . Types: Cigarettes  Smokeless Tobacco Never Used    Goals Met:  Independence with exercise equipment Exercise tolerated well No report of cardiac concerns or symptoms Strength training completed today  Goals Unmet:  Not Applicable  Comments: Pt able to follow exercise prescription today without complaint.  Will continue to monitor for progression.    [name] is Medical Director for [place] and [name].

## 2019-09-11 ENCOUNTER — Other Ambulatory Visit: Payer: Self-pay

## 2019-09-11 ENCOUNTER — Encounter: Payer: No Typology Code available for payment source | Admitting: *Deleted

## 2019-09-11 DIAGNOSIS — I213 ST elevation (STEMI) myocardial infarction of unspecified site: Secondary | ICD-10-CM

## 2019-09-11 NOTE — Progress Notes (Signed)
Daily Session Note  Patient Details  Name: DOMINIQ FONTAINE MRN: [id number] Date of Birth: 07-13-1956 Referring Provider:     Cardiac Rehab from 05/01/2019 in [facility] Card[facility] and Pul[facility] Rehab  Referring Provider  Lynda Rainwater, MD      Encounter Date: 09/11/2019  Check In: Session Check In - 09/11/19 1115      Check-In   Supervising physician immediately available to respond to emergencies  See telemetry face sheet for immediately available ER MD    Location  [facility]iac & Pul[facility]    Staff Present  Renita Papa, RN Moises Blood, BS, ACSM CEP, Exercise Physiologist;Joseph Tessie Fass RCP,RRT,BSRT    Virtual Visit  No    Medication changes reported      No    Fall or balance concerns reported     No    Warm-up and Cool-down  Performed on first and last piece of equipment    Resistance Training Performed  Yes    VAD Patient?  No    PAD/SET Patient?  No      Pain Assessment   Currently in Pain?  No/denies          Social History   Tobacco Use  Smoking Status Current Every Day Smoker  . Packs/day: 1.00  . Years: 40.00  . Pack years: 40.00  . Types: Cigarettes  Smokeless Tobacco Never Used    Goals Met:  Independence with exercise equipment Exercise tolerated well No report of cardiac concerns or symptoms Strength training completed today  Goals Unmet:  Not Applicable  Comments: Pt able to follow exercise prescription today without complaint.  Will continue to monitor for progression.    [name] is Medical Director for [place] and [name].

## 2019-09-12 ENCOUNTER — Other Ambulatory Visit: Payer: Self-pay

## 2019-09-12 ENCOUNTER — Encounter: Payer: No Typology Code available for payment source | Admitting: *Deleted

## 2019-09-12 DIAGNOSIS — I213 ST elevation (STEMI) myocardial infarction of unspecified site: Secondary | ICD-10-CM

## 2019-09-12 NOTE — Progress Notes (Signed)
Daily Session Note  Patient Details  Name: Brian Mcclure MRN: [id number] Date of Birth: 02-Dec-1955 Referring Provider:     Cardiac Rehab from 05/01/2019 in [facility] Cardiac and Pulmonary Rehab  Referring Provider  Lynda Rainwater, MD      Encounter Date: 09/12/2019  Check In: Session Check In - 09/12/19 1116      Check-In   Supervising physician immediately available to respond to emergencies  See telemetry face sheet for immediately available ER MD    Location  [facility]-Cardiac [facility]    Staff Present  Renita Papa, RN BSN;Melissa Caiola RDN, LDN;Jessica [place], MA, RCEP, CCRP, CCET;Amanda Sommer, BA, ACSM CEP, Exercise Physiologist    Virtual Visit  No    Medication changes reported      No    Fall or balance concerns reported     No    Warm-up and Cool-down  Performed on first and last piece of equipment    Resistance Training Performed  Yes    VAD Patient?  No    PAD/SET Patient?  No      Pain Assessment   Currently in Pain?  No/denies          Social History   Tobacco Use  Smoking Status Current Every Day Smoker  . Packs/day: 1.00  . Years: 40.00  . Pack years: 40.00  . Types: Cigarettes  Smokeless Tobacco Never Used    Goals Met:  Independence with exercise equipment Exercise tolerated well No report of cardiac concerns or symptoms Strength training completed today  Goals Unmet:  Not Applicable  Comments: Pt able to follow exercise prescription today without complaint.  Will continue to monitor for progression.    [name] is Medical Director for [place] and [name].

## 2019-09-13 ENCOUNTER — Encounter: Payer: Self-pay | Admitting: *Deleted

## 2019-09-13 DIAGNOSIS — I213 ST elevation (STEMI) myocardial infarction of unspecified site: Secondary | ICD-10-CM

## 2019-09-13 NOTE — Progress Notes (Signed)
Cardiac Individual Treatment Plan  Patient Details  Name: Brian Mcclure MRN: [id number] Date of Birth: 07/19/1956 Referring Provider:     Cardiac Rehab from 05/01/2019 in [facility] Cardiac and Pulmonary Rehab  Referring Provider  Lynda Rainwater, MD      Initial Encounter Date:    Cardiac Rehab from 05/01/2019 in [facility] Cardiac and Pulmonary Rehab  Date  05/01/19      Visit Diagnosis: ST elevation myocardial infarction (STEMI), unspecified artery ([place])  Patient's Home Medications on Admission:  Current Outpatient Medications:  .  Ca Carbonate-Mag Hydroxide (ROLAIDS PO), Take 1 tablet by mouth at bedtime as needed (heartburn/acid reflux)., Disp: , Rfl:  .  cholecalciferol (VITAMIN D) 25 MCG (1000 UT) tablet, Take 1,000 Units by mouth 2 (two) times daily., Disp: , Rfl:  .  clopidogrel (PLAVIX) 75 MG tablet, Take 1 tablet (75 mg total) by mouth daily., Disp: 30 tablet, Rfl: 1 .  diclofenac sodium (VOLTAREN) 1 % GEL, Apply 1 application topically daily as needed (pain)., Disp: , Rfl:  .  famotidine (PEPCID) 20 MG tablet, Take 20 mg by mouth daily. , Disp: , Rfl:  .  losartan (COZAAR) 25 MG tablet, Take 0.5 tablets (12.5 mg total) by mouth daily., Disp: 15 tablet, Rfl: 1 .  metoprolol succinate (TOPROL XL) 25 MG 24 hr tablet, Take 0.5 tablets (12.5 mg total) by mouth daily., Disp: 45 tablet, Rfl: 3 .  nitroGLYCERIN (NITROSTAT) 0.4 MG SL tablet, Place 1 tablet (0.4 mg total) under the tongue every 5 (five) minutes x 3 doses as needed for chest pain., Disp: 25 tablet, Rfl: 12 .  oxymetazoline (AFRIN) 0.05 % nasal spray, Place 1 spray into both nostrils 2 (two) times daily as needed for congestion., Disp: , Rfl:  .  rosuvastatin (CRESTOR) 40 MG tablet, Take 1 tablet (40 mg total) by mouth at bedtime., Disp: 30 tablet, Rfl: 1 .  Simethicone (GAS-X PO), Take 1 tablet by mouth 3 (three) times daily as needed (heartburn)., Disp: , Rfl:  .  warfarin (COUMADIN) 5 MG tablet, Take 1/2 tablet daily or as  directed by the anti-coag clinic., Disp: 50 tablet, Rfl: 0  Past Medical History: Past Medical History:  Diagnosis Date  . Actinic keratosis   . Acute ST elevation myocardial infarction (STEMI) of anterior wall ([place])    a. 02/2019 s/p PCI/DES ->LAD.  Marland Kitchen Apical mural thrombus following MI ([place])    a. 02/2019 s/p Ant MI-->Echo: Small, fixed thrombus on the apical wall of LV-->coumadin.  . Barrett's esophagus   . CAD (coronary artery disease)    a. 02/2019 Ant STEMI/PCI: LM nl, LAD 100p (4x18 Resolute Onyx DES), LCX/RCA min irregs, EF 35-45%.  . Chronic back pain   . Colon polyps    a. 07/2017 Colonoscopy: 7m sigmoid polyp, tubular adenoma, diverticulosis, int hemorrhoids.  .Marland KitchenCOPD (chronic obstructive pulmonary disease) ([place]   . Degeneration of lumbar intervertebral disc   . Dysphonia   . GERD (gastroesophageal reflux disease)   . HFrEF (heart failure with reduced ejection fraction) ([place]    a. 02/2019 Echo: EF 40-45%, apical, septal, inf, and inforlateral AK. Mid and apical anterior/mid anteroseptal AK. Mildly dil LA.  .Marland KitchenHydrocele   . Hyperlipidemia   . Ischemic cardiomyopathy    a. 02/2019 Echo: EF 40-45%.  . Myalgia   . Osteoarthritis    C-spine  . PAF (paroxysmal atrial fibrillation) (H[facility])    a. CHA2DS2VASc = 2.  . Peripheral vascular disease ([place]    a.  2018 s/p aorto-iliac stent graft.  . Reactive airway disease   . Seborrheic keratosis   . Sinus bradycardia    a. 02/2019 Jxnl rhythm and sinus brady in setting of Ant MI-->[facility] d/c'd.  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  . Tobacco use disorder   . Tremor     Tobacco Use: Social History   Tobacco Use  Smoking Status Current Every Day Smoker  . Packs/day: 1.00  . Years: 40.00  . Pack years: 40.00  . Types: Cigarettes  Smokeless Tobacco Never Used    Labs: Recent Review Flowsheet Data    Labs for ITP Cardiac and Pulmonary Rehab Latest Ref Rng & Units 03/12/2019 05/03/2019   Cholestrol 100 -  199 mg/dL 104 96(L)   LDLCALC 0 - 99 mg/dL 59 46   HDL >39 mg/dL 31(L) 34(L)   Trlycerides 0 - 149 mg/dL 70 79   Hemoglobin A1c 4.8 - 5.6 % 6.0(H) -   TCO2 22 - 32 mmol/L 18(L) -       Exercise Target Goals: Exercise Program Goal: Individual exercise prescription set using results from initial 6 min walk test and THRR while considering  patient's activity barriers and safety.   Exercise Prescription Goal: Initial exercise prescription builds to 30-45 minutes a day of aerobic activity, 2-3 days per week.  Home exercise guidelines will be given to patient during program as part of exercise prescription that the participant will acknowledge.  Activity Barriers & Risk Stratification: Activity Barriers & Cardiac Risk Stratification - 05/01/19 1017      Activity Barriers & Cardiac Risk Stratification   Activity Barriers  Joint Problems;Back Problems;Muscular Weakness;Deconditioning;Other (comment)    Comments  claudication pain    Cardiac Risk Stratification  High       6 Minute Walk: 6 Minute Walk    Row Name 05/01/19 1017         6 Minute Walk   Phase  Initial     Distance  1765 feet     Walk Time  6 minutes     # of Rest Breaks  0     MPH  3.34     METS  4.37     RPE  10     Perceived Dyspnea   1     VO2 Peak  15.3     Symptoms  Yes (comment)     Comments  claudication pain 3/5, SOB     Resting HR  52 bpm     Resting BP  118/64     Resting Oxygen Saturation   99 %     Exercise Oxygen Saturation  during 6 min walk  100 %     Max Ex. HR  92 bpm     Max Ex. BP  132/74     2 Minute Post BP  124/62        Oxygen Initial Assessment:   Oxygen Re-Evaluation:   Oxygen Discharge (Final Oxygen Re-Evaluation):   Initial Exercise Prescription: Initial Exercise Prescription - 05/01/19 1000      Date of Initial Exercise RX and Referring Provider   Date  05/01/19    Referring Provider  Lynda Rainwater, MD      Treadmill   MPH  3    Grade  1    Minutes  15    METs   3.71      Recumbant Bike   Level  3    RPM  50    Watts  63    Minutes  15    METs  3      NuStep   Level  4    SPM  80    Minutes  15    METs  3      Prescription Details   Frequency (times per week)  2    Duration  Progress to 30 minutes of continuous aerobic without signs/symptoms of physical distress      Intensity   THRR 40-80% of Max Heartrate  94-136    Ratings of Perceived Exertion  11-13    Perceived Dyspnea  0-4      Progression   Progression  Continue to progress workloads to maintain intensity without signs/symptoms of physical distress.      Resistance Training   Training Prescription  Yes    Weight  4 lbs    Reps  10-15       Perform Capillary Blood Glucose checks as needed.  Exercise Prescription Changes: Exercise Prescription Changes    Row Name 05/01/19 1000 05/18/19 1400 05/31/19 1200 06/13/19 1400 06/28/19 1500     Response to Exercise   Blood Pressure (Admit)  118/64  122/64  120/60  110/60  128/62   Blood Pressure (Exercise)  132/74  134/64  126/60  142/70  150/60   Blood Pressure (Exit)  124/62  96/56  110/62  122/64  130/62   Heart Rate (Admit)  52 bpm  83 bpm  69 bpm  61 bpm  70 bpm   Heart Rate (Exercise)  92 bpm  117 bpm  118 bpm  121 bpm  114 bpm   Heart Rate (Exit)  --  82 bpm  77 bpm  74 bpm  70 bpm   Oxygen Saturation (Admit)  99 %  --  --  --  --   Oxygen Saturation (Exercise)  100 %  --  --  --  --   Rating of Perceived Exertion (Exercise)  10  --  _0 Perceived Dyspnea (Exercise)  1  --  --  --  --   Symptoms  claudication pain 3/5, SOB  none  none  none  none   Comments  walk test results  --  --  --  --   Duration  --  Continue with 30 min of aerobic exercise without signs/symptoms of physical distress.  Continue with 30 min of aerobic exercise without signs/symptoms of physical distress.  Continue with 30 min of aerobic exercise without signs/symptoms of physical distress.  Continue with 30 min of aerobic exercise  without signs/symptoms of physical distress.   Intensity  --  THRR unchanged  THRR unchanged  THRR unchanged  THRR unchanged     Progression   Progression  --  Continue to progress workloads to maintain intensity without signs/symptoms of physical distress.  Continue to progress workloads to maintain intensity without signs/symptoms of physical distress.  Continue to progress workloads to maintain intensity without signs/symptoms of physical distress.  Continue to progress workloads to maintain intensity without signs/symptoms of physical distress.   Average METs  --  4.05  5.47  5.6  5.29     Resistance Training   Training Prescription  --  Yes  Yes  Yes  Yes   Weight  --  0 ROM only due to shoulder problem  6 lbs  6 lb  6 lb   Reps  --  10-15  10-15  10-15  10-15     Interval Training   Interval Training  --  --  No  No  No     Treadmill   MPH  --  3.5  3.5  3.5  3.5   Grade  --  _0 Minutes  --  _1 METs  --  4.16  4.16  4.16  4.16     Recumbant Bike   Level  --  --  _2 Minutes  --  --  _3 METs  --  --  5.5  5.5  --     NuStep   Level  --  --  _4 Minutes  --  --  _5 METs  --  --  6.8  6.6  8.7     Recumbant Elliptical   Level  --  --  --  --  4.5   Minutes  --  --  --  --  15   METs  --  --  --  --  3     T5 Nustep   Level  --  7  --  --  --   SPM  --  80  --  --  --   Minutes  --  15  --  --  --   METs  --  4  --  --  --     Home Exercise Plan   Plans to continue exercise at  --  --  Home (comment) walking  Home (comment) walking  Home (comment) walking   Frequency  --  --  Add 2 additional days to program exercise sessions.  Add 2 additional days to program exercise sessions.  Add 2 additional days to program exercise sessions.   Initial Home Exercises Provided  --  --  05/08/19  05/08/19  05/08/19   Row Name 08/09/19 1500 09/06/19 0900           Response to Exercise   Blood Pressure (Admit)  130/70   126/64      Blood Pressure (Exercise)  132/58  152/74      Blood Pressure (Exit)  126/50  126/62      Heart Rate (Admit)  55 bpm  64 bpm      Heart Rate (Exercise)  108 bpm  112 bpm      Heart Rate (Exit)  89 bpm  86 bpm      Rating of Perceived Exertion (Exercise)  12  12      Symptoms  none  none      Duration  Continue with 30 min of aerobic exercise without signs/symptoms of physical distress.  Continue with 30 min of aerobic exercise without signs/symptoms of physical distress.      Intensity  THRR unchanged  THRR unchanged        Progression   Progression  Continue to progress workloads to maintain intensity without signs/symptoms of physical distress.  Continue to progress workloads to maintain intensity without signs/symptoms of physical distress.      Average METs  5  5.2        Resistance Training   Training Prescription  Yes  Yes      Weight  6 lb  6 lb  Reps  10-15  10-15        Interval Training   Interval Training  No  No        Treadmill   MPH  --  3.9      Grade  --  1      Minutes  --  15      METs  --  5.2        Recumbant Bike   Level  6  --      Minutes  15  --      METs  4.36  --        Recumbant Elliptical   Level  10  2      Minutes  15  15      METs  6  3        Home Exercise Plan   Plans to continue exercise at  Home (comment) walking  Home (comment) walking      Frequency  Add 2 additional days to program exercise sessions.  Add 2 additional days to program exercise sessions.      Initial Home Exercises Provided  05/08/19  05/08/19         Exercise Comments: Exercise Comments    Row Name 05/01/19 0948           Exercise Comments  First full day of exercise!  Patient was oriented to gym and equipment including functions, settings, policies, and procedures.  Patient's individual exercise prescription and treatment plan were reviewed.  All starting workloads were established based on the results of the 6 minute walk test done at initial  orientation visit.  The plan for exercise progression was also introduced and progression will be customized based on patient's performance and goals.          Exercise Goals and Review: Exercise Goals    Row Name 05/01/19 1021             Exercise Goals   Increase Physical Activity  Yes       Intervention  Provide advice, education, support and counseling about physical activity/exercise needs.;Develop an individualized exercise prescription for aerobic and resistive training based on initial evaluation findings, risk stratification, comorbidities and participant's personal goals.       Expected Outcomes  Short Term: Attend rehab on a regular basis to increase amount of physical activity.;Long Term: Add in home exercise to make exercise part of routine and to increase amount of physical activity.;Long Term: Exercising regularly at least 3-5 days a week.       Increase Strength and Stamina  Yes       Intervention  Provide advice, education, support and counseling about physical activity/exercise needs.;Develop an individualized exercise prescription for aerobic and resistive training based on initial evaluation findings, risk stratification, comorbidities and participant's personal goals.       Expected Outcomes  Short Term: Increase workloads from initial exercise prescription for resistance, speed, and METs.;Short Term: Perform resistance training exercises routinely during rehab and add in resistance training at home;Long Term: Improve cardiorespiratory fitness, muscular endurance and strength as measured by increased METs and functional capacity (6MWT)       Able to understand and use rate of perceived exertion (RPE) scale  Yes       Intervention  Provide education and explanation on how to use RPE scale       Expected Outcomes  Short Term: Able to use RPE daily in rehab to express subjective intensity  level;Long Term:  Able to use RPE to guide intensity level when exercising independently        Able to understand and use Dyspnea scale  Yes       Intervention  Provide education and explanation on how to use Dyspnea scale       Expected Outcomes  Short Term: Able to use Dyspnea scale daily in rehab to express subjective sense of shortness of breath during exertion;Long Term: Able to use Dyspnea scale to guide intensity level when exercising independently       Knowledge and understanding of Target Heart Rate Range (THRR)  Yes       Intervention  Provide education and explanation of THRR including how the numbers were predicted and where they are located for reference       Expected Outcomes  Short Term: Able to state/look up THRR;Short Term: Able to use daily as guideline for intensity in rehab;Long Term: Able to use THRR to govern intensity when exercising independently       Able to check pulse independently  Yes       Intervention  Provide education and demonstration on how to check pulse in carotid and radial arteries.;Review the importance of being able to check your own pulse for safety during independent exercise       Expected Outcomes  Short Term: Able to explain why pulse checking is important during independent exercise;Long Term: Able to check pulse independently and accurately       Understanding of Exercise Prescription  Yes       Intervention  Provide education, explanation, and written materials on patient's individual exercise prescription       Expected Outcomes  Short Term: Able to explain program exercise prescription;Long Term: Able to explain home exercise prescription to exercise independently       Improve claudication pain toleration; Improve walking ability  Yes       Intervention  Participate in PAD/SET Rehab 2-3 days a week, walking at home as part of exercise prescription;Attend education sessions to aid in risk factor modification and understanding of disease process       Expected Outcomes  Short Term: Improve walking distance/time to onset of claudication  pain;Long Term: Improve walking ability and toleration to claudication          Exercise Goals Re-Evaluation : Exercise Goals Re-Evaluation    Row Name 05/01/19 1026 05/08/19 1022 05/10/19 0847 05/18/19 1413 05/31/19 1225     Exercise Goal Re-Evaluation   Exercise Goals Review  Increase Physical Activity;Increase Strength and Stamina;Understanding of Exercise Prescription  Increase Physical Activity;Increase Strength and Stamina;Understanding of Exercise Prescription;Able to understand and use rate of perceived exertion (RPE) scale;Knowledge and understanding of Target Heart Rate Range (THRR);Able to check pulse independently  Understanding of Exercise Prescription;Increase Strength and Stamina;Increase Physical Activity  Increase Physical Activity;Increase Strength and Stamina;Able to understand and use rate of perceived exertion (RPE) scale;Knowledge and understanding of Target Heart Rate Range (THRR);Able to check pulse independently;Understanding of Exercise Prescription  Increase Physical Activity;Increase Strength and Stamina;Understanding of Exercise Prescription   Comments  Reviewed RPE scale, THR and program prescription with pt today.  Pt voiced understanding and was given a copy of goals to take home.  Reviewed home exercise with pt today.  Pt plans to walk and do yard work for exercise.  Reviewed THR, pulse, RPE, sign and symptoms, NTG use, and when to call 911 or MD.  Also discussed weather considerations and indoor options.  Pt  voiced understanding.  Patient states that he will walk when he is done with rehab. Patient has a pulse ox to chech is heart rate and oxygen. John will have home exercise when he has had a little more time to get use to rehab. Explained to patient why it is important to continue exercising when he is done with the program. He wants to be able to jog more.  Jon tolerates exercise well and worked at around 4 METS.  Staff will work with him on getting back to jogging  Wille Glaser  has been doing well in rehab.  He is up to level 7 on the NuStep.  We will continue to monitor his progress.   Expected Outcomes  Short: Use RPE daily to regulate intensity. Long: Follow program prescription in THR.  Short: add 1-2 days of walking at home following home exercise guidlines. Long: Become indepenedent with exercise program.  Short: increase strength and stamina. Long: be able to jog.  Short - attend consistently Long - incorporate joggging into program  Short: Walk on vacation.  Long: Continue to improve stamina.   [place] Name 06/12/19 0941 06/13/19 1437 06/28/19 1521 07/28/19 1050 08/09/19 1518     Exercise Goal Re-Evaluation   Exercise Goals Review  Increase Physical Activity;Increase Strength and Stamina;Understanding of Exercise Prescription  Increase Physical Activity;Increase Strength and Stamina;Able to understand and use rate of perceived exertion (RPE) scale;Able to understand and use Dyspnea scale;Understanding of Exercise Prescription  Increase Physical Activity;Increase Strength and Stamina;Understanding of Exercise Prescription  --  Increase Physical Activity;Increase Strength and Stamina;Able to understand and use rate of perceived exertion (RPE) scale;Able to understand and use Dyspnea scale;Knowledge and understanding of Target Heart Rate Range (THRR);Able to check pulse independently;Understanding of Exercise Prescription   Comments  Wille Glaser continues to attend cardiac rehab regularly. He reports doing some form of exercise on all days he is not in class. He has made steady increases in his workloads in class and reports he feels he has had an increase in stamina and is able to do more at home without taking as many breaks.  John was out for two weeks on vacation.  He has been able to maintain previous levels on machines  Wille Glaser is doing well in rehab.  He was able to return to his previous workloads and should now be ready to start to move up some more or talk about intervals.  We will  continue to monitor his progress.  Out since last review.  Wille Glaser missed some time due to the holidays but has maintained levels on machines.   Expected Outcomes  Short: continue to attend cariac rehab classes regularly. Long: Continue to impove stamina and become independent with exercise program.  Short - continueto attend consistently Long - increase MET level  Short: Talk about intervals.  Long: Continue to improve stamina.  --  Short - try intervals Long:  exercise consistently when not at program sessions   Row Name 08/23/19 1219 08/29/19 1136 09/06/19 0932         Exercise Goal Re-Evaluation   Exercise Goals Review  --  Increase Physical Activity;Increase Strength and Stamina;Understanding of Exercise Prescription  --     Comments  Out since last review due to COVID exposure.  Wille Glaser is doing well in rehab. Marland KitchenHe is glad to be getting back to regular class.  He has not been as consistent at home, but wants to get back to it.  He walks on his own and some  at work too.  He feels that his strength and stamina are improving.  Wille Glaser continues to make steady progress in rehab.  He varies TM speed and grade for variety.  He works at correct HR and RPE range     Expected Outcomes  --  Short: Continue to add in more exercise at home. Long: Continue to improve strength.  Short - exercise consistently at home Long:  increase overall MET level        Discharge Exercise Prescription (Final Exercise Prescription Changes): Exercise Prescription Changes - 09/06/19 0900      Response to Exercise   Blood Pressure (Admit)  126/64    Blood Pressure (Exercise)  152/74    Blood Pressure (Exit)  126/62    Heart Rate (Admit)  64 bpm    Heart Rate (Exercise)  112 bpm    Heart Rate (Exit)  86 bpm    Rating of Perceived Exertion (Exercise)  12    Symptoms  none    Duration  Continue with 30 min of aerobic exercise without signs/symptoms of physical distress.    Intensity  THRR unchanged      Progression   Progression   Continue to progress workloads to maintain intensity without signs/symptoms of physical distress.    Average METs  5.2      Resistance Training   Training Prescription  Yes    Weight  6 lb    Reps  10-15      Interval Training   Interval Training  No      Treadmill   MPH  3.9    Grade  1    Minutes  15    METs  5.2      Recumbant Elliptical   Level  2    Minutes  15    METs  3      Home Exercise Plan   Plans to continue exercise at  Home (comment)   walking   Frequency  Add 2 additional days to program exercise sessions.    Initial Home Exercises Provided  05/08/19       Nutrition:  Target Goals: Understanding of nutrition guidelines, daily intake of sodium '1500mg'$ , cholesterol '200mg'$ , calories 30% from fat and 7% or less from saturated fats, daily to have 5 or more servings of fruits and vegetables.  Biometrics: Pre Biometrics - 05/01/19 1021      Pre Biometrics   Height  6' 1.5" (1.867 m)    Weight  183 lb 3.2 oz (83.1 kg)    BMI (Calculated)  23.84    Single Leg Stand  30 seconds        Nutrition Therapy Plan and Nutrition Goals: Nutrition Therapy & Goals - 04/18/19 1209      Nutrition Therapy   Diet  low Na, HH diet    Drug/Food Interactions  Coumadin/Vit K    Protein (specify units)  65g    Fiber  30 grams    Whole Grain Foods  3 servings    Saturated Fats  12 max. grams    Fruits and Vegetables  5 servings/day    Sodium  1.5 grams      Personal Nutrition Goals   Nutrition Goal  ST: eat salads 2x/week LT: feel better    Comments  Pt reports working all day doing chores. Pt reports that he sometimes does eat breakfast, but if he does he will have a bagel and cream cheese or ham biscuit. L and D  is usually chicken and a starch, pt reports liking vegetables. Dessert is usually some chocolate and ~10 vanilla wafers with peanut butter. Discussed HH eating. Pt reports he thinks his diet is ok but he should eat more salad. Discussed consistency and vitaminK  and medication.      Intervention Plan   Intervention  Prescribe, educate and counsel regarding individualized specific dietary modifications aiming towards targeted core components such as weight, hypertension, lipid management, diabetes, heart failure and other comorbidities.;Nutrition handout(s) given to patient.    Expected Outcomes  Short Term Goal: Understand basic principles of dietary content, such as calories, fat, sodium, cholesterol and nutrients.;Short Term Goal: A plan has been developed with personal nutrition goals set during dietitian appointment.;Long Term Goal: Adherence to prescribed nutrition plan.       Nutrition Assessments: Nutrition Assessments - 05/01/19 1023      MEDFICTS Scores   Pre Score  38       Nutrition Goals Re-Evaluation: Nutrition Goals Re-Evaluation    Row Name 05/22/19 1024 06/14/19 0952 07/31/19 1530 09/05/19 1141       Goals   Nutrition Goal  ST: eat salads 2x/week LT: feel better  ST: include more vegetables - add 2x/week additional servings LT: feel better  ST: continue to include a variety of foods LT: feel better  ST: continue to include a variety of foods LT: feel better    Comment  Continue with current changes  Pt reports diet is not as good as it could be. Pt able to add salads in some of the time. Pt reports diet stayed mostly the same since we last spoke. Pt reports that he wants to get in more vegetables, but it is hard with a small household (preparing full meals), discussed easy ways to include vegetables into meals he is already having. Pt reported concenr for medication and vitamin K again, reiterated thagt it was about consistency and if he wanted to increase his intake of these foods he would just have to be consistent and talk to his doctor beforehand.  Pt reports eating more venison - making soup out of that with more vegetables. pt reports energy is medium - about the same as before. Pt reports strength is good.  Pt reports that his  diet is going well. No questions or concerns. Energy level is stable and strength is good.    Expected Outcome  ST: eat salads 2x/week LT: feel better  ST: include more vegetables - add 2x/week additional servings LT: feel better  ST: continue to include a variety of foods LT: feel better  ST: continue to include a variety of foods LT: feel better       Nutrition Goals Discharge (Final Nutrition Goals Re-Evaluation): Nutrition Goals Re-Evaluation - 09/05/19 1141      Goals   Nutrition Goal  ST: continue to include a variety of foods LT: feel better    Comment  Pt reports that his diet is going well. No questions or concerns. Energy level is stable and strength is good.    Expected Outcome  ST: continue to include a variety of foods LT: feel better       Psychosocial: Target Goals: Acknowledge presence or absence of significant depression and/or stress, maximize coping skills, provide positive support system. Participant is able to verbalize types and ability to use techniques and skills needed for reducing stress and depression.   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 He is hoping being here will help him get stronger and increase his stamina      Family Dynamics   Good Support System?  Yes      Barriers   Psychosocial barriers to participate in program  There are no identifiable barriers or psychosocial needs.;The patient should benefit from training in stress management and relaxation.      Screening Interventions   Interventions  Encouraged to exercise;Program counselor consult;To provide support and resources with identified psychosocial needs    Expected Outcomes  Short Term goal: Utilizing psychosocial counselor, staff  and physician to assist with identification of specific Stressors or current issues interfering with healing process. Setting desired goal for each stressor or current issue identified.;Long Term Goal: Stressors or current issues are controlled or eliminated.;Short Term goal: Identification and review with participant of any Quality of Life or Depression concerns found by scoring the questionnaire.;Long Term goal: The participant improves quality of Life and PHQ9 Scores as seen by post scores and/or verbalization of changes       Quality of Life Scores:  Quality of Life - 05/01/19 1022      Quality of Life   Select  Quality of Life      Quality of Life Scores   Health/Function Pre  23.53 %    Socioeconomic Pre  24.42 %    Psych/Spiritual Pre  27 %    Family Pre  27.1 %    GLOBAL Pre  24.97 %      Scores of 19 and below usually indicate a poorer quality of life in these areas.  A difference of  2-3 points is a clinically meaningful difference.  A difference of 2-3 points in the total score of the Quality of Life Index has been associated with significant improvement in overall quality of life, self-image, physical symptoms, and general health in studies assessing change in quality of life.  PHQ-9: Recent Review Flowsheet Data    Depression screen [facility] 2/9 05/01/2019   Decreased Interest 0   Down, Depressed, Hopeless 0   PHQ - 2 Score 0   Altered sleeping 0   Tired, decreased energy 1   Change in appetite 1   Feeling bad or failure about yourself  0   Trouble concentrating 0   Moving slowly or fidgety/restless 0   Suicidal thoughts 0   PHQ-9 Score 2   Difficult doing work/chores Somewhat difficult     Interpretation of Total Score  Total Score Depression Severity:  1-4 = Minimal depression, 5-9 = Mild depression, 10-14 = Moderate depression, 15-19 = Moderately severe depression, 20-27 = Severe depression   Psychosocial Evaluation and Intervention: Psychosocial Evaluation -  04/17/19 1416      Psychosocial Evaluation & Interventions   Comments  Feels behind of doing things around the house and 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 He is hoping being here will help him get stronger and increase his stamina    Expected Outcomes  Short: attend [facility] on consistent basis to achieve goals. Long: develop self care habits    Continue Psychosocial Services   Follow up required by staff       Psychosocial Re-Evaluation: Psychosocial  Re-Evaluation    Row Name 05/10/19 [phone] 06/12/19 0959 08/29/19 1138         Psychosocial Re-Evaluation   Current issues with  Current Stress Concerns  Current Stress Concerns  None Identified     Comments  Patient wants to get his health back on track. He wants to do things like remodel and downsize but feels like he does not have enough time left. He feels like he is not checking off things off his bucket list. Jenny Reichmann is retired and finding himself very busy and not enough time to do things around the house.  Patient stated that he has no new stress concerns and feels he is able to manage this aspect of his life. He stated that he feels his stamina is increasing and he can work on project at more of a steady pace now and take fewer breaks. He reports sleeping well at night.  Wille Glaser is doing well mentally.  He is glad to be getting back to a routine again.  He sleeps well and denies any major stressors.     Expected Outcomes  Short: continue to exercise to reduce stress. Long: maintain exercise to keep stress at a minimum.  Short: continue to exercise to reduce stress. Long: maintain exercise to keep stress at a minimum.  Short: Continue to exercise Long: Continue to stay positive and get into routine.     Interventions  Encouraged to attend Cardiac Rehabilitation for the exercise  --  Encouraged to attend Cardiac Rehabilitation for the exercise     Continue Psychosocial Services   Follow up required by  staff  Follow up required by staff  Follow up required by staff        Psychosocial Discharge (Final Psychosocial Re-Evaluation): Psychosocial Re-Evaluation - 08/29/19 1138      Psychosocial Re-Evaluation   Current issues with  None Identified    Comments  Wille Glaser is doing well mentally.  He is glad to be getting back to a routine again.  He sleeps well and denies any major stressors.    Expected Outcomes  Short: Continue to exercise Long: Continue to stay positive and get into routine.    Interventions  Encouraged to attend Cardiac Rehabilitation for the exercise    Continue Psychosocial Services   Follow up required by staff       Vocational Rehabilitation: Provide vocational rehab assistance to qualifying candidates.   Vocational Rehab Evaluation & Intervention: Vocational Rehab - 04/17/19 1406      Initial Vocational Rehab Evaluation & Intervention   Assessment shows need for Vocational Rehabilitation  No       Education: Education Goals: Education classes will be provided on a variety of topics geared toward better understanding of heart health and risk factor modification. Participant will state understanding/return demonstration of topics presented as noted by education test scores.  Learning Barriers/Preferences: Learning Barriers/Preferences - 04/17/19 1406      Learning Barriers/Preferences   Learning Barriers  None    Learning Preferences  Individual Instruction       Education Topics:  AED/CPR: - Group verbal and written instruction with the use of models to demonstrate the basic use of the AED with the basic ABC's of resuscitation.   General Nutrition Guidelines/Fats and Fiber: -Group instruction provided by verbal, written material, models and posters to present the general guidelines for heart healthy nutrition. Gives an explanation and review of dietary fats and fiber.   Controlling Sodium/Reading Food Labels: -Group verbal and written  material supporting  the discussion of sodium use in heart healthy nutrition. Review and explanation with models, verbal and written materials for utilization of the food label.   Exercise Physiology & General Exercise Guidelines: - Group verbal and written instruction with models to review the exercise physiology of the cardiovascular system and associated critical values. Provides general exercise guidelines with specific guidelines to those with heart or lung disease.    Aerobic Exercise & Resistance Training: - Gives group verbal and written instruction on the various components of exercise. Focuses on aerobic and resistive training programs and the benefits of this training and how to safely progress through these programs..   Flexibility, Balance, Mind/Body Relaxation: Provides group verbal/written instruction on the benefits of flexibility and balance training, including mind/body exercise modes such as yoga, pilates and tai chi.  Demonstration and skill practice provided.   Stress and Anxiety: - Provides group verbal and written instruction about the health risks of elevated stress and causes of high stress.  Discuss the correlation between heart/lung disease and anxiety and treatment options. Review healthy ways to manage with stress and anxiety.   Depression: - Provides group verbal and written instruction on the correlation between heart/lung disease and depressed mood, treatment options, and the stigmas associated with seeking treatment.   Anatomy & Physiology of the Heart: - Group verbal and written instruction and models provide basic cardiac anatomy and physiology, with the coronary electrical and arterial systems. Review of Valvular disease and Heart Failure   Cardiac Procedures: - Group verbal and written instruction to review commonly prescribed medications for heart disease. Reviews the medication, class of the drug, and side effects. Includes the steps to properly store meds and maintain  the prescription regimen. (beta blockers and nitrates)   Cardiac Medications I: - Group verbal and written instruction to review commonly prescribed medications for heart disease. Reviews the medication, class of the drug, and side effects. Includes the steps to properly store meds and maintain the prescription regimen.   Cardiac Medications II: -Group verbal and written instruction to review commonly prescribed medications for heart disease. Reviews the medication, class of the drug, and side effects. (all other drug classes)    Go Sex-Intimacy & Heart Disease, Get SMART - Goal Setting: - Group verbal and written instruction through game format to discuss heart disease and the return to sexual intimacy. Provides group verbal and written material to discuss and apply goal setting through the application of the S.M.A.R.T. Method.   Other Matters of the Heart: - Provides group verbal, written materials and models to describe Stable Angina and Peripheral Artery. Includes description of the disease process and treatment options available to the cardiac patient.   Exercise & Equipment Safety: - Individual verbal instruction and demonstration of equipment use and safety with use of the equipment.   Infection Prevention: - Provides verbal and written material to individual with discussion of infection control including proper hand washing and proper equipment cleaning during exercise session.   Falls Prevention: - Provides verbal and written material to individual with discussion of falls prevention and safety.   Diabetes: - Individual verbal and written instruction to review signs/symptoms of diabetes, desired ranges of glucose level fasting, after meals and with exercise. Acknowledge that pre and post exercise glucose checks will be done for 3 sessions at entry of program.   Know Your Numbers and Risk Factors: -Group verbal and written instruction about important numbers in your  health.  Discussion of what are risk factors  and how they play a role in the disease process.  Review of Cholesterol, Blood Pressure, Diabetes, and BMI and the role they play in your overall health.   Sleep Hygiene: -Provides group verbal and written instruction about how sleep can affect your health.  Define sleep hygiene, discuss sleep cycles and impact of sleep habits. Review good sleep hygiene tips.    Other: -Provides group and verbal instruction on various topics (see comments)   Knowledge Questionnaire Score: Knowledge Questionnaire Score - 05/01/19 1023      Knowledge Questionnaire Score   Pre Score  21/26 education focus: A&P, PAD, Nutrition, Exercise       Core Components/Risk Factors/Patient Goals at Admission: Personal Goals and Risk Factors at Admission - 05/01/19 1024      Core Components/Risk Factors/Patient Goals on Admission    Weight Management  Yes;Weight Maintenance    Intervention  Weight Management: Develop a combined nutrition and exercise program designed to reach desired caloric intake, while maintaining appropriate intake of nutrient and fiber, sodium and fats, and appropriate energy expenditure required for the weight goal.;Weight Management: Provide education and appropriate resources to help participant work on and attain dietary goals.    Admit Weight  183 lb 3.2 oz (83.1 kg)    Goal Weight: Short Term  183 lb (83 kg)    Goal Weight: Long Term  183 lb (83 kg)    Expected Outcomes  Short Term: Continue to assess and modify interventions until short term weight is achieved;Long Term: Adherence to nutrition and physical activity/exercise program aimed toward attainment of established weight goal;Weight Maintenance: Understanding of the daily nutrition guidelines, which includes 25-35% calories from fat, 7% or less cal from saturated fats, less than '200mg'$  cholesterol, less than 1.5gm of sodium, & 5 or more servings of fruits and vegetables daily    Number of  packs per day  1 pack/ day    Intervention  Assist the participant in steps to quit. Provide individualized education and counseling about committing to Tobacco Cessation, relapse prevention, and pharmacological support that can be provided by physician.;Advice worker, assist with locating and accessing local/national Quit Smoking programs, and support quit date choice.    Expected Outcomes  Short Term: Will demonstrate readiness to quit, by selecting a quit date.;Short Term: Will quit all tobacco product use, adhering to prevention of relapse plan.;Long Term: Complete abstinence from all tobacco products for at least 12 months from quit date.    Lipids  Yes    Intervention  Provide education and support for participant on nutrition & aerobic/resistive exercise along with prescribed medications to achieve LDL '70mg'$ , HDL >'40mg'$ .    Expected Outcomes  Short Term: Participant states understanding of desired cholesterol values and is compliant with medications prescribed. Participant is following exercise prescription and nutrition guidelines.;Long Term: Cholesterol controlled with medications as prescribed, with individualized exercise RX and with personalized nutrition plan. Value goals: LDL < '70mg'$ , HDL > 40 mg.       Core Components/Risk Factors/Patient Goals Review:  Goals and Risk Factor Review    Row Name 05/10/19 0858 06/12/19 0943 08/29/19 1141         Core Components/Risk Factors/Patient Goals Review   Personal Goals Review  Weight Management/Obesity;Improve shortness of breath with ADL's;Tobacco Cessation;Lipids  Weight Management/Obesity;Tobacco Cessation;Lipids;Improve shortness of breath with ADL's  Weight Management/Obesity;Tobacco Cessation;Lipids;Improve shortness of breath with ADL's     Review  John smokes a pack a day and does not have any interest in quitting.  He is managing his weight and staying around 183 pounds. His lipids are doing well and he is taking medication  for it. He has labs done recently and he states it was below 19.  John continues to maintain his weight, and take all medications as perscribed to control lipid levels. He continues to smoke a pack and day and is not interested in quitting. Patient stated that shortness of breath does not limit him so much, but more leg fatigue. He does feel that his stamina is increasing and he does not have to take as many rest breaks while doing physical work as he used to. He is now able to work at a steady pace.  Wille Glaser is doing well in rehab. His weight is staying steady.  His blood pressures continue do well.  He still has some SOB, but it has improved some since he has started.  He is still smoking 1 ppd and not ready to quit currently.     Expected Outcomes  Short: continue to exercise to improve ADL'S. Long: maintain ADL independently.  Short: continue to exercise to improve ADL'S. Long: maintain ADL independently.  Short: Continue to exercise to help with SOB.  Long: Continue to maintain exercise.        Core Components/Risk Factors/Patient Goals at Discharge (Final Review):  Goals and Risk Factor Review - 08/29/19 1141      Core Components/Risk Factors/Patient Goals Review   Personal Goals Review  Weight Management/Obesity;Tobacco Cessation;Lipids;Improve shortness of breath with ADL's    Review  Wille Glaser is doing well in rehab. His weight is staying steady.  His blood pressures continue do well.  He still has some SOB, but it has improved some since he has started.  He is still smoking 1 ppd and not ready to quit currently.    Expected Outcomes  Short: Continue to exercise to help with SOB.  Long: Continue to maintain exercise.       ITP Comments: ITP Comments    Row Name 04/17/19 1401 04/18/19 1211 04/18/19 1311 04/26/19 1112 05/01/19 0947   ITP Comments  Virtual Initial Oriention completed. Diagnosis can be [facility] in [facility] 8/23. EP/RD orientation scheduled fro 9/29 at 11 am  completed RD initial eval  When  patient arrived today patient reported to Nada Maclachlan, EP, that he had not taken his Plavix in 3 days due to being out of the medication.  The reason reported was he had not yet received his medications in the mail from the [place].  Patient reported this is the only medication he has missed.  Patient no longer taking baby aspirin as he was advised to stop Aspirin after one month.  He is also on Coumadin due to afib / flutter.  Sent secure chat message to Ronda Fairly, RN and Ignacia Bayley, NP at [facility] to request Plavix prescription be called in to [facility] in [place].  Patient has [employer] in addition to the [place]. Verlon Au, RN responded on [employer] letting me know she had called the prescription in.  Patient instructed to go pick up Plavix prescription and take medication today.  Note:  Patient did not exercise today.     Patient will return on Monday, April 24, 2019 to start Cardiac Rehab.  30 day review completed. ITP sent to [name], Medical Director of Cardiac and Pulmonary Rehab. Continue with ITP unless changes are made by physician.  Department closed starting 10/2 until further notice by infection  prevention and Health at Work teams for [place].  First full day of exercise!  Patient was oriented to gym and equipment including functions, settings, policies, and procedures.  Patient's individual exercise prescription and treatment plan were reviewed.  All starting workloads were established based on the results of the 6 minute walk test done at initial orientation visit.  The plan for exercise progression was also introduced and progression will be customized based on patient's performance and goals.   Row Name 05/24/19 0637 05/31/19 1225 07/11/19 1447 07/19/19 0852 07/28/19 1049   ITP Comments  30 day review completed. Continue with ITP sent to [name], Medical Director of Cardiac and Pulmonary Rehab for review , changes as needed and signature.  Wille Glaser is  out of town for vacation for two weeks.  Wille Glaser has not attended since 06/28/2019.  He plans to return after New Year.  30 day review competed . ITP sent to Dr Emily Filbert for review, changes as needed and ITP approval signature  Wille Glaser has been out since 06/28/19 and plans to return on Monday 07/31/19.   [place] Name 08/15/19 1246 08/16/19 1229 09/13/19 0638       ITP Comments  Mr. Porzio called to inform staff that he has been around a COVID positive person. He cancelled his appointment for Monday and will keep [name] updated.  30 day review completed. ITP sent to [name], Medical Director of Cardiac and Pulmonary Rehab. Continue with ITP unless changes are made by physician.  Department operating under reduced schedule until further notice by request from hospital leadership.  30 day chart review completed. ITP sent to Dr Zachery Dakins Medical Director, for review,changes as needed and signature.        Comments:

## 2019-09-18 ENCOUNTER — Encounter: Payer: No Typology Code available for payment source | Attending: Internal Medicine | Admitting: *Deleted

## 2019-09-18 ENCOUNTER — Other Ambulatory Visit: Payer: Self-pay

## 2019-09-18 DIAGNOSIS — I213 ST elevation (STEMI) myocardial infarction of unspecified site: Secondary | ICD-10-CM | POA: Diagnosis present

## 2019-09-18 NOTE — Progress Notes (Signed)
Daily Session Note  Patient Details  Name: Brian Mcclure MRN: [id number] Date of Birth: 1956/05/13 Referring Provider:     Cardiac Rehab from 05/01/2019 in [facility] Cardiac and Pulmonary Rehab  Referring Provider  Lynda Rainwater, MD      Encounter Date: 09/18/2019  Check In: Session Check In - 09/18/19 1132      Check-In   Supervising physician immediately available to respond to emergencies  See telemetry face sheet for immediately available ER MD    Location  [facility]-Cardiac & Pulmonary Rehab    Staff Present  Renita Papa, RN Vickki Hearing, BA, ACSM CEP, Exercise Physiologist;Kelly Amedeo Plenty, BS, ACSM CEP, Exercise Physiologist    Virtual Visit  No    Medication changes reported      No    Fall or balance concerns reported     No    Warm-up and Cool-down  Performed on first and last piece of equipment    Resistance Training Performed  Yes    VAD Patient?  No    PAD/SET Patient?  No      Pain Assessment   Currently in Pain?  No/denies          Social History   Tobacco Use  Smoking Status Current Every Day Smoker  . Packs/day: 1.00  . Years: 40.00  . Pack years: 40.00  . Types: Cigarettes  Smokeless Tobacco Never Used    Goals Met:  Independence with exercise equipment Exercise tolerated well No report of cardiac concerns or symptoms Strength training completed today  Goals Unmet:  Not Applicable  Comments: Pt able to follow exercise prescription today without complaint.  Will continue to monitor for progression.    [name] is Medical Director for [name].

## 2019-09-20 ENCOUNTER — Encounter: Payer: No Typology Code available for payment source | Admitting: *Deleted

## 2019-09-20 ENCOUNTER — Other Ambulatory Visit: Payer: Self-pay

## 2019-09-20 DIAGNOSIS — I213 ST elevation (STEMI) myocardial infarction of unspecified site: Secondary | ICD-10-CM | POA: Diagnosis not present

## 2019-09-20 NOTE — Progress Notes (Signed)
Daily Session Note  Patient Details  Name: EHSAN CORVIN MRN: [id number] Date of Birth: [birth year] Referring Provider:     Cardiac Rehab from 05/01/2019 in [facility] Cardiac and Pulmonary Rehab  Referring Provider  Lynda Rainwater, MD      Encounter Date: 09/20/2019  Check In: Session Check In - 09/20/19 1127      Check-In   Supervising physician immediately available to respond to emergencies  See telemetry face sheet for immediately available ER MD    Location  [facility]-Cardiac & Pulmonary Rehab    Staff Present  Renita Papa, RN BSN;Joseph Hood RCP,RRT,BSRT;Jeanna Durrell BS, Exercise Physiologist    Virtual Visit  No    Medication changes reported      No    Fall or balance concerns reported     No    Warm-up and Cool-down  Performed on first and last piece of equipment    Resistance Training Performed  Yes    VAD Patient?  No    PAD/SET Patient?  No      Pain Assessment   Currently in Pain?  No/denies          Social History   Tobacco Use  Smoking Status Current Every Day Smoker  . Packs/day: 1.00  . Years: 40.00  . Pack years: 40.00  . Types: Cigarettes  Smokeless Tobacco Never Used    Goals Met:  Independence with exercise equipment Exercise tolerated well No report of cardiac concerns or symptoms Strength training completed today  Goals Unmet:  Not Applicable  Comments: Pt able to follow exercise prescription today without complaint.  Will continue to monitor for progression.    [name] is Medical Director for [place] and [name].

## 2019-09-22 ENCOUNTER — Other Ambulatory Visit: Payer: Self-pay

## 2019-09-22 ENCOUNTER — Encounter: Payer: No Typology Code available for payment source | Admitting: *Deleted

## 2019-09-22 DIAGNOSIS — I213 ST elevation (STEMI) myocardial infarction of unspecified site: Secondary | ICD-10-CM | POA: Diagnosis not present

## 2019-09-22 NOTE — Progress Notes (Signed)
Daily Session Note  Patient Details  Name: Brian Mcclure MRN: [id number] Date of Birth: [birth year] Referring Provider:     Cardiac Rehab from 05/01/2019 in [facility] Cardiac and Pulmonary Rehab  Referring Provider  Lynda Rainwater, MD      Encounter Date: 09/22/2019  Check In: Session Check In - 09/22/19 1119      Check-In   Supervising physician immediately available to respond to emergencies  See telemetry face sheet for immediately available ER MD    Location  [facility]Cardiac & Pulmonary Rehab    Staff Present  Renita Papa, RN BSN;Joseph [address] [place], [place], [place], CCRP, [place], [place], ACSM CEP, Exercise Physiologist    Virtual Visit  No    Medication changes reported      No    Fall or balance concerns reported     No    Warm-up and Cool-down  Performed on first and last piece of equipment    Resistance Training Performed  Yes    VAD Patient?  No    PAD/SET Patient?  No      Pain Assessment   Currently in Pain?  No/denies          Social History   Tobacco Use  Smoking Status Current Every Day Smoker  . Packs/day: 1.00  . Years: 40.00  . Pack years: 40.00  . Types: Cigarettes  Smokeless Tobacco Never Used    Goals Met:  Independence with exercise equipment Exercise tolerated well No report of cardiac concerns or symptoms Strength training completed today  Goals Unmet:  Not Applicable  Comments: Pt able to follow exercise prescription today without complaint.  Will continue to monitor for progression.    [name] is Medical Director for [place] and [name].

## 2019-09-25 ENCOUNTER — Other Ambulatory Visit: Payer: Self-pay

## 2019-09-25 ENCOUNTER — Encounter: Payer: No Typology Code available for payment source | Admitting: *Deleted

## 2019-09-25 DIAGNOSIS — I213 ST elevation (STEMI) myocardial infarction of unspecified site: Secondary | ICD-10-CM | POA: Diagnosis not present

## 2019-09-25 NOTE — Progress Notes (Signed)
Daily Session Note  Patient Details  Name: Brian Mcclure MRN: [id number] Date of Birth: [birth year] Referring Provider:     Cardiac Rehab from 05/01/2019 in [facility] Cardiac and Pulmonary Rehab  Referring Provider  Lynda Rainwater, MD      Encounter Date: 09/25/2019  Check In: Session Check In - 09/25/19 1146      Check-In   Supervising physician immediately available to respond to emergencies  See telemetry face sheet for immediately available ER MD    Location  [facility]Cardiac [facility] Pul[facility]    Staff Present  Renita Papa, RN Moises Blood, BS, ACSM CEP, Exercise Physiologist;Joseph Tessie Fass RCP,RRT,BSRT    Virtual Visit  No    Medication changes reported      No    Fall or balance concerns reported     No    Warm-up and Cool-down  Performed on first and last piece of equipment    Resistance Training Performed  Yes    VAD Patient?  No    PAD/SET Patient?  No      Pain Assessment   Currently in Pain?  No/denies          Social History   Tobacco Use  Smoking Status Current Every Day Smoker  . Packs/day: 1.00  . Years: 40.00  . Pack years: 40.00  . Types: Cigarettes  Smokeless Tobacco Never Used    Goals Met:  Independence with exercise equipment Exercise tolerated well No report of cardiac concerns or symptoms Strength training completed today  Goals Unmet:  Not Applicable  Comments: Pt able to follow exercise prescription today without complaint.  Will continue to monitor for progression.    [name] is Medical Director for [place] and [name].

## 2019-09-27 ENCOUNTER — Ambulatory Visit (INDEPENDENT_AMBULATORY_CARE_PROVIDER_SITE_OTHER): Payer: No Typology Code available for payment source

## 2019-09-27 ENCOUNTER — Other Ambulatory Visit: Payer: Self-pay

## 2019-09-27 ENCOUNTER — Encounter: Payer: No Typology Code available for payment source | Admitting: *Deleted

## 2019-09-27 DIAGNOSIS — I480 Paroxysmal atrial fibrillation: Secondary | ICD-10-CM | POA: Diagnosis not present

## 2019-09-27 DIAGNOSIS — I236 Thrombosis of atrium, auricular appendage, and ventricle as current complications following acute myocardial infarction: Secondary | ICD-10-CM | POA: Diagnosis not present

## 2019-09-27 DIAGNOSIS — Z5181 Encounter for therapeutic drug level monitoring: Secondary | ICD-10-CM

## 2019-09-27 DIAGNOSIS — I213 ST elevation (STEMI) myocardial infarction of unspecified site: Secondary | ICD-10-CM

## 2019-09-27 LAB — POCT INR: INR: 1.5 — AB (ref 2.0–3.0)

## 2019-09-27 NOTE — Progress Notes (Signed)
Daily Session Note  Patient Details  Name: Brian Mcclure MRN: [id number] Date of Birth: Oct 25, 1955 Referring Provider:     Cardiac Rehab from 05/01/2019 in [facility] Cardiac and Pulmonary Rehab  Referring Provider  Lynda Rainwater, MD      Encounter Date: 09/27/2019  Check In: Session Check In - 09/27/19 1044      Check-In   Supervising physician immediately available to respond to emergencies  See telemetry face sheet for immediately available ER MD    Location  [facility]    Staff Present  Renita Papa, RN BSN;Jeanna Durrell BS, Exercise Physiologist;Joseph Tessie Fass RCP,RRT,BSRT    Virtual Visit  No    Medication changes reported      No    Fall or balance concerns reported     No    Warm-up and Cool-down  Performed on first and last piece of equipment    Resistance Training Performed  Yes    VAD Patient?  No    PAD/SET Patient?  No      Pain Assessment   Currently in Pain?  No/denies          Social History   Tobacco Use  Smoking Status Current Every Day Smoker  . Packs/day: 1.00  . Years: 40.00  . Pack years: 40.00  . Types: Cigarettes  Smokeless Tobacco Never Used    Goals Met:  Independence with exercise equipment Exercise tolerated well No report of cardiac concerns or symptoms Strength training completed today  Goals Unmet:  Not Applicable  Comments: Pt able to follow exercise prescription today without complaint.  Will continue to monitor for progression.    [name] is Medical Director for [place] and [name].

## 2019-09-27 NOTE — Patient Instructions (Signed)
-   take 1 tablet warfarin today & tomorrow - then resume dosage of warfarin 2.5 mg every day. - recheck in 3 weeks

## 2019-09-29 ENCOUNTER — Encounter: Payer: No Typology Code available for payment source | Admitting: *Deleted

## 2019-09-29 ENCOUNTER — Other Ambulatory Visit: Payer: Self-pay

## 2019-09-29 DIAGNOSIS — I213 ST elevation (STEMI) myocardial infarction of unspecified site: Secondary | ICD-10-CM

## 2019-09-29 NOTE — Progress Notes (Signed)
Daily Session Note  Patient Details  Name: Brian Mcclure MRN: [id number] Date of Birth: 05-Jan-1956 Referring Provider:     Cardiac Rehab from 05/01/2019 in [facility] Cardiac and Pulmonary Rehab  Referring Provider  Lynda Rainwater, MD      Encounter Date: 09/29/2019  Check In: Session Check In - 09/29/19 1121      Check-In   Supervising physician immediately available to respond to emergencies  See telemetry face sheet for immediately available ER MD    Location  [facility] Pul[facility] Re[facility]    Staff Present  Renita Papa, RN BSN;Jessica Stanley, MA, RCEP, CCRP, CCET;Amanda Sommer, BA, ACSM CEP, Exercise Physiologist    Virtual Visit  No    Medication changes reported      No    Warm-up and Cool-down  Performed on first and last piece of equipment    Resistance Training Performed  Yes    VAD Patient?  No    PAD/SET Patient?  No      Pain Assessment   Currently in Pain?  No/denies          Social History   Tobacco Use  Smoking Status Current Every Day Smoker  . Packs/day: 1.00  . Years: 40.00  . Pack years: 40.00  . Types: Cigarettes  Smokeless Tobacco Never Used    Goals Met:  Independence with exercise equipment Exercise tolerated well No report of cardiac concerns or symptoms Strength training completed today  Goals Unmet:  Not Applicable  Comments: Pt able to follow exercise prescription today without complaint.  Will continue to monitor for progression.    [name] is Medical Director for [place] and [name].

## 2019-10-02 ENCOUNTER — Encounter: Payer: No Typology Code available for payment source | Admitting: *Deleted

## 2019-10-02 ENCOUNTER — Other Ambulatory Visit: Payer: Self-pay

## 2019-10-02 DIAGNOSIS — I213 ST elevation (STEMI) myocardial infarction of unspecified site: Secondary | ICD-10-CM

## 2019-10-02 NOTE — Progress Notes (Signed)
Daily Session Note  Patient Details  Name: Brian Mcclure MRN: [id number] Date of Birth: 1955/12/19 Referring Provider:     Cardiac Rehab from 05/01/2019 in [facility] Cardiac and Pulmonary Rehab  Referring Provider  Lynda Rainwater, MD      Encounter Date: 10/02/2019  Check In: Session Check In - 10/02/19 1124      Check-In   Supervising physician immediately available to respond to emergencies  See telemetry face sheet for immediately available ER MD    Location  [facility] Pul[facility] Re[facility]    Staff Present  Renita Papa, RN BSN;Joseph [address] [place], [place], ACSM CEP, Exercise Physiologist    Virtual Visit  No    Medication changes reported      No    Fall or balance concerns reported     No    Warm-up and Cool-down  Performed on first and last piece of equipment    Resistance Training Performed  Yes    VAD Patient?  No    PAD/SET Patient?  No      Pain Assessment   Currently in Pain?  No/denies          Social History   Tobacco Use  Smoking Status Current Every Day Smoker  . Packs/day: 1.00  . Years: 40.00  . Pack years: 40.00  . Types: Cigarettes  Smokeless Tobacco Never Used    Goals Met:  Independence with exercise equipment Exercise tolerated well No report of cardiac concerns or symptoms Strength training completed today  Goals Unmet:  Not Applicable  Comments: Pt able to follow exercise prescription today without complaint.  Will continue to monitor for progression.    [name] is Medical Director for [name] and [name].

## 2019-10-04 ENCOUNTER — Other Ambulatory Visit: Payer: Self-pay

## 2019-10-04 ENCOUNTER — Encounter: Payer: No Typology Code available for payment source | Admitting: *Deleted

## 2019-10-04 DIAGNOSIS — I213 ST elevation (STEMI) myocardial infarction of unspecified site: Secondary | ICD-10-CM

## 2019-10-04 NOTE — Progress Notes (Signed)
Daily Session Note  Patient Details  Name: Brian Mcclure MRN: [id number] Date of Birth: 11-Apr-1956 Referring Provider:     Cardiac Rehab from 05/01/2019 in [facility] Cardiac and Pulmonary Rehab  Referring Provider  Brian Rainwater, MD      Encounter Date: 10/04/2019  Check In: Session Check In - 10/04/19 1124      Check-In   Supervising physician immediately available to respond to emergencies  See telemetry face sheet for immediately available ER MD    Location  [facility] Pulmonary Re[facility]    Staff Present  Renita Papa, RN BSN;Joseph Flavia Shipper;Heath Lark, RN, BSN, CCRP    Virtual Visit  No    Medication changes reported      No    Fall or balance concerns reported     No    Warm-up and Cool-down  Performed on first and last piece of equipment    Resistance Training Performed  Yes    VAD Patient?  No    PAD/SET Patient?  No      Pain Assessment   Currently in Pain?  No/denies          Social History   Tobacco Use  Smoking Status Current Every Day Smoker  . Packs/day: 1.00  . Years: 40.00  . Pack years: 40.00  . Types: Cigarettes  Smokeless Tobacco Never Used    Goals Met:  Independence with exercise equipment Exercise tolerated well No report of cardiac concerns or symptoms Strength training completed today  Goals Unmet:  Not Applicable  Comments: Pt able to follow exercise prescription today without complaint.  Will continue to monitor for progression.    [name] is Medical Director for [place] and [name].

## 2019-10-06 ENCOUNTER — Encounter: Payer: No Typology Code available for payment source | Admitting: *Deleted

## 2019-10-06 ENCOUNTER — Other Ambulatory Visit: Payer: Self-pay

## 2019-10-06 DIAGNOSIS — I213 ST elevation (STEMI) myocardial infarction of unspecified site: Secondary | ICD-10-CM

## 2019-10-06 NOTE — Progress Notes (Signed)
Daily Session Note  Patient Details  Name: Brian Mcclure MRN: [id number] Date of Birth: [birth year] Referring Provider:     Cardiac Rehab from 05/01/2019 in [facility] Cardiac and Pulmonary Rehab  Referring Provider  Lynda Rainwater, MD      Encounter Date: 10/06/2019  Check In: Session Check In - 10/06/19 1117      Check-In   Supervising physician immediately available to respond to emergencies  See telemetry face sheet for immediately available ER MD    Location  [facility]Card[facility] Pulmonary Re[facility]    Staff Present  Renita Papa, RN BSN;Joseph [address] [place], [place], [place], [place], CCET;Mary Kellie Shropshire, RN, BSN, Kela Millin, BA, ACSM CEP, Exercise Physiologist    Virtual Visit  No    Medication changes reported      No    Fall or balance concerns reported     No    Warm-up and Cool-down  Performed on first and last piece of equipment    Resistance Training Performed  Yes    VAD Patient?  No    PAD/SET Patient?  No      Pain Assessment   Currently in Pain?  No/denies          Social History   Tobacco Use  Smoking Status Current Every Day Smoker  . Packs/day: 1.00  . Years: 40.00  . Pack years: 40.00  . Types: Cigarettes  Smokeless Tobacco Never Used    Goals Met:  Independence with exercise equipment Exercise tolerated well No report of cardiac concerns or symptoms Strength training completed today  Goals Unmet:  Not Applicable  Comments: Pt able to follow exercise prescription today without complaint.  Will continue to monitor for progression.    [name] is Medical Director for [place] and [name].

## 2019-10-09 ENCOUNTER — Other Ambulatory Visit: Payer: Self-pay

## 2019-10-09 ENCOUNTER — Encounter: Payer: No Typology Code available for payment source | Admitting: *Deleted

## 2019-10-09 DIAGNOSIS — I213 ST elevation (STEMI) myocardial infarction of unspecified site: Secondary | ICD-10-CM

## 2019-10-09 NOTE — Progress Notes (Signed)
Daily Session Note  Patient Details  Name: Brian Mcclure MRN: [id number] Date of Birth: [birth year] Referring Provider:     Cardiac Rehab from 05/01/2019 in [facility] Cardiac and Pulmonary Rehab  Referring Provider  Lynda Rainwater, MD      Encounter Date: 10/09/2019  Check In: Session Check In - 10/09/19 1128      Check-In   Supervising physician immediately available to respond to emergencies  See telemetry face sheet for immediately available ER MD    Location  [facility]-Cardiac & Pulmonary Rehab    Staff Present  Renita Papa, RN BSN;Joseph [place], [place], ACSM CEP, Exercise Physiologist    Virtual Visit  No    Medication changes reported      No    Fall or balance concerns reported     No    Warm-up and Cool-down  Performed on first and last piece of equipment    Resistance Training Performed  Yes    VAD Patient?  No    PAD/SET Patient?  No      Pain Assessment   Currently in Pain?  No/denies          Social History   Tobacco Use  Smoking Status Current Every Day Smoker  . Packs/day: 1.00  . Years: 40.00  . Pack years: 40.00  . Types: Cigarettes  Smokeless Tobacco Never Used    Goals Met:  Independence with exercise equipment Exercise tolerated well No report of cardiac concerns or symptoms Strength training completed today  Goals Unmet:  Not Applicable  Comments: Pt able to follow exercise prescription today without complaint.  Will continue to monitor for progression.    [name] is Medical Director for [place] and [name].

## 2019-10-11 ENCOUNTER — Encounter: Payer: Self-pay | Admitting: *Deleted

## 2019-10-11 ENCOUNTER — Other Ambulatory Visit: Payer: Self-pay

## 2019-10-11 ENCOUNTER — Encounter: Payer: No Typology Code available for payment source | Admitting: *Deleted

## 2019-10-11 DIAGNOSIS — I213 ST elevation (STEMI) myocardial infarction of unspecified site: Secondary | ICD-10-CM | POA: Diagnosis not present

## 2019-10-11 NOTE — Progress Notes (Signed)
Cardiac Individual Treatment Plan  Patient Details  Name: Brian Mcclure MRN: [id number] Date of Birth: 07/19/1956 Referring Provider:     Cardiac Rehab from 05/01/2019 in [facility] Cardiac and Pulmonary Rehab  Referring Provider  Lynda Rainwater, MD      Initial Encounter Date:    Cardiac Rehab from 05/01/2019 in [facility] Cardiac and Pulmonary Rehab  Date  05/01/19      Visit Diagnosis: ST elevation myocardial infarction (STEMI), unspecified artery ([place])  Patient's Home Medications on Admission:  Current Outpatient Medications:  .  Ca Carbonate-Mag Hydroxide (ROLAIDS PO), Take 1 tablet by mouth at bedtime as needed (heartburn/acid reflux)., Disp: , Rfl:  .  cholecalciferol (VITAMIN D) 25 MCG (1000 UT) tablet, Take 1,000 Units by mouth 2 (two) times daily., Disp: , Rfl:  .  clopidogrel (PLAVIX) 75 MG tablet, Take 1 tablet (75 mg total) by mouth daily., Disp: 30 tablet, Rfl: 1 .  diclofenac sodium (VOLTAREN) 1 % GEL, Apply 1 application topically daily as needed (pain)., Disp: , Rfl:  .  famotidine (PEPCID) 20 MG tablet, Take 20 mg by mouth daily. , Disp: , Rfl:  .  losartan (COZAAR) 25 MG tablet, Take 0.5 tablets (12.5 mg total) by mouth daily., Disp: 15 tablet, Rfl: 1 .  metoprolol succinate (TOPROL XL) 25 MG 24 hr tablet, Take 0.5 tablets (12.5 mg total) by mouth daily., Disp: 45 tablet, Rfl: 3 .  nitroGLYCERIN (NITROSTAT) 0.4 MG SL tablet, Place 1 tablet (0.4 mg total) under the tongue every 5 (five) minutes x 3 doses as needed for chest pain., Disp: 25 tablet, Rfl: 12 .  oxymetazoline (AFRIN) 0.05 % nasal spray, Place 1 spray into both nostrils 2 (two) times daily as needed for congestion., Disp: , Rfl:  .  rosuvastatin (CRESTOR) 40 MG tablet, Take 1 tablet (40 mg total) by mouth at bedtime., Disp: 30 tablet, Rfl: 1 .  Simethicone (GAS-X PO), Take 1 tablet by mouth 3 (three) times daily as needed (heartburn)., Disp: , Rfl:  .  warfarin (COUMADIN) 5 MG tablet, Take 1/2 tablet daily or as  directed by the anti-coag clinic., Disp: 50 tablet, Rfl: 0  Past Medical History: Past Medical History:  Diagnosis Date  . Actinic keratosis   . Acute ST elevation myocardial infarction (STEMI) of anterior wall ([place])    a. 02/2019 s/p PCI/DES ->LAD.  Marland Kitchen Apical mural thrombus following MI ([place])    a. 02/2019 s/p Ant MI-->Echo: Small, fixed thrombus on the apical wall of LV-->coumadin.  . Barrett's esophagus   . CAD (coronary artery disease)    a. 02/2019 Ant STEMI/PCI: LM nl, LAD 100p (4x18 Resolute Onyx DES), LCX/RCA min irregs, EF 35-45%.  . Chronic back pain   . Colon polyps    a. 07/2017 Colonoscopy: 7m sigmoid polyp, tubular adenoma, diverticulosis, int hemorrhoids.  .Marland KitchenCOPD (chronic obstructive pulmonary disease) ([place]   . Degeneration of lumbar intervertebral disc   . Dysphonia   . GERD (gastroesophageal reflux disease)   . HFrEF (heart failure with reduced ejection fraction) ([place]    a. 02/2019 Echo: EF 40-45%, apical, septal, inf, and inforlateral AK. Mid and apical anterior/mid anteroseptal AK. Mildly dil LA.  .Marland KitchenHydrocele   . Hyperlipidemia   . Ischemic cardiomyopathy    a. 02/2019 Echo: EF 40-45%.  . Myalgia   . Osteoarthritis    C-spine  . PAF (paroxysmal atrial fibrillation) (H[facility])    a. CHA2DS2VASc = 2.  . Peripheral vascular disease ([place]    a.  2018 s/p aorto-iliac stent graft.  . Reactive airway disease   . Seborrheic keratosis   . Sinus bradycardia    a. 02/2019 Jxnl rhythm and sinus brady in setting of Ant MI-->[facility]B d/c'd.  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  . Tobacco use disorder   . Tremor     Tobacco Use: Social History   Tobacco Use  Smoking Status Current Every Day Smoker  . Packs/day: 1.00  . Years: 40.00  . Pack years: 40.00  . Types: Cigarettes  Smokeless Tobacco Never Used    Labs: Recent Review Flowsheet Data    Labs for ITP Cardiac and Pulmonary Rehab Latest Ref Rng & Units 03/12/2019 05/03/2019   Cholestrol 100 -  199 mg/dL 104 96(L)   LDLCALC 0 - 99 mg/dL 59 46   HDL >39 mg/dL 31(L) 34(L)   Trlycerides 0 - 149 mg/dL 70 79   Hemoglobin A1c 4.8 - 5.6 % 6.0(H) -   TCO2 22 - 32 mmol/L 18(L) -       Exercise Target Goals: Exercise Program Goal: Individual exercise prescription set using results from initial 6 min walk test and THRR while considering  patient's activity barriers and safety.   Exercise Prescription Goal: Initial exercise prescription builds to 30-45 minutes a day of aerobic activity, 2-3 days per week.  Home exercise guidelines will be given to patient during program as part of exercise prescription that the participant will acknowledge.  Activity Barriers & Risk Stratification: Activity Barriers & Cardiac Risk Stratification - 05/01/19 1017      Activity Barriers & Cardiac Risk Stratification   Activity Barriers  Joint Problems;Back Problems;Muscular Weakness;Deconditioning;Other (comment)    Comments  claudication pain    Cardiac Risk Stratification  High       6 Minute Walk: 6 Minute Walk    Row Name 05/01/19 1017         6 Minute Walk   Phase  Initial     Distance  1765 feet     Walk Time  6 minutes     # of Rest Breaks  0     MPH  3.34     METS  4.37     RPE  10     Perceived Dyspnea   1     VO2 Peak  15.3     Symptoms  Yes (comment)     Comments  claudication pain 3/5, SOB     Resting HR  52 bpm     Resting BP  118/64     Resting Oxygen Saturation   99 %     Exercise Oxygen Saturation  during 6 min walk  100 %     Max Ex. HR  92 bpm     Max Ex. BP  132/74     2 Minute Post BP  124/62        Oxygen Initial Assessment:   Oxygen Re-Evaluation:   Oxygen Discharge (Final Oxygen Re-Evaluation):   Initial Exercise Prescription: Initial Exercise Prescription - 05/01/19 1000      Date of Initial Exercise RX and Referring Provider   Date  05/01/19    Referring Provider  Lynda Rainwater, MD      Treadmill   MPH  3    Grade  1    Minutes  15    METs   3.71      Recumbant Bike   Level  3    RPM  50    Watts  63    Minutes  15    METs  3      NuStep   Level  4    SPM  80    Minutes  15    METs  3      Prescription Details   Frequency (times per week)  2    Duration  Progress to 30 minutes of continuous aerobic without signs/symptoms of physical distress      Intensity   THRR 40-80% of Max Heartrate  94-136    Ratings of Perceived Exertion  11-13    Perceived Dyspnea  0-4      Progression   Progression  Continue to progress workloads to maintain intensity without signs/symptoms of physical distress.      Resistance Training   Training Prescription  Yes    Weight  4 lbs    Reps  10-15       Perform Capillary Blood Glucose checks as needed.  Exercise Prescription Changes: Exercise Prescription Changes    Row Name 05/01/19 1000 05/18/19 1400 05/31/19 1200 06/13/19 1400 06/28/19 1500     Response to Exercise   Blood Pressure (Admit)  118/64  122/64  120/60  110/60  128/62   Blood Pressure (Exercise)  132/74  134/64  126/60  142/70  150/60   Blood Pressure (Exit)  124/62  96/56  110/62  122/64  130/62   Heart Rate (Admit)  52 bpm  83 bpm  69 bpm  61 bpm  70 bpm   Heart Rate (Exercise)  92 bpm  117 bpm  118 bpm  121 bpm  114 bpm   Heart Rate (Exit)  -  82 bpm  77 bpm  74 bpm  70 bpm   Oxygen Saturation (Admit)  99 %  -  -  -  -   Oxygen Saturation (Exercise)  100 %  -  -  -  -   Rating of Perceived Exertion (Exercise)  10  -  _0 Perceived Dyspnea (Exercise)  1  -  -  -  -   Symptoms  claudication pain 3/5, SOB  none  none  none  none   Comments  walk test results  -  -  -  -   Duration  -  Continue with 30 min of aerobic exercise without signs/symptoms of physical distress.  Continue with 30 min of aerobic exercise without signs/symptoms of physical distress.  Continue with 30 min of aerobic exercise without signs/symptoms of physical distress.  Continue with 30 min of aerobic exercise without signs/symptoms  of physical distress.   Intensity  -  THRR unchanged  THRR unchanged  THRR unchanged  THRR unchanged     Progression   Progression  -  Continue to progress workloads to maintain intensity without signs/symptoms of physical distress.  Continue to progress workloads to maintain intensity without signs/symptoms of physical distress.  Continue to progress workloads to maintain intensity without signs/symptoms of physical distress.  Continue to progress workloads to maintain intensity without signs/symptoms of physical distress.   Average METs  -  4.05  5.47  5.6  5.29     Resistance Training   Training Prescription  -  Yes  Yes  Yes  Yes   Weight  -  0 ROM only due to shoulder problem  6 lbs  6 lb  6 lb   Reps  -  10-15  10-15  10-15  10-15     Interval Training   Interval Training  -  -  No  No  No     Treadmill   MPH  -  3.5  3.5  3.5  3.5   Grade  -  '1  1  1  1   '$ Minutes  -  '15  15  15  15   '$ METs  -  4.16  4.16  4.16  4.16     Recumbant Bike   Level  -  -  '4  4  4   '$ Minutes  -  -  '15  15  15   '$ METs  -  -  5.5  5.5  -     NuStep   Level  -  -  '7  7  7   '$ Minutes  -  -  '15  15  15   '$ METs  -  -  6.8  6.6  8.7     Recumbant Elliptical   Level  -  -  -  -  4.5   Minutes  -  -  -  -  15   METs  -  -  -  -  3     T5 Nustep   Level  -  7  -  -  -   SPM  -  80  -  -  -   Minutes  -  15  -  -  -   METs  -  4  -  -  -     Home Exercise Plan   Plans to continue exercise at  -  -  Home (comment) walking  Home (comment) walking  Home (comment) walking   Frequency  -  -  Add 2 additional days to program exercise sessions.  Add 2 additional days to program exercise sessions.  Add 2 additional days to program exercise sessions.   Initial Home Exercises Provided  -  -  05/08/19  05/08/19  05/08/19   Row Name 08/09/19 1500 09/06/19 0900 09/20/19 1400 10/05/19 1300       Response to Exercise   Blood Pressure (Admit)  130/70  126/64  126/60  118/62    Blood Pressure (Exercise)  132/58   152/74  124/70  142/60    Blood Pressure (Exit)  126/50  126/62  118/66  104/58    Heart Rate (Admit)  55 bpm  64 bpm  68 bpm  59 bpm    Heart Rate (Exercise)  108 bpm  112 bpm  112 bpm  113 bpm    Heart Rate (Exit)  89 bpm  86 bpm  76 bpm  80 bpm    Rating of Perceived Exertion (Exercise)  '12  12  13  13    '$ Symptoms  none  none  none   none    Duration  Continue with 30 min of aerobic exercise without signs/symptoms of physical distress.  Continue with 30 min of aerobic exercise without signs/symptoms of physical distress.  Continue with 30 min of aerobic exercise without signs/symptoms of physical distress.  Continue with 30 min of aerobic exercise without signs/symptoms of physical distress.    Intensity  THRR unchanged  THRR unchanged  THRR unchanged  THRR unchanged      Progression   Progression  Continue to progress workloads to maintain intensity without signs/symptoms of physical distress.  Continue to progress workloads to  maintain intensity without signs/symptoms of physical distress.  Continue to progress workloads to maintain intensity without signs/symptoms of physical distress.  Continue to progress workloads to maintain intensity without signs/symptoms of physical distress.    Average METs  5  5.2  5.06  5.36      Resistance Training   Training Prescription  Yes  Yes  Yes  Yes    Weight  6 lb  6 lb  6 lb   6 lb    Reps  10-15  10-15  10-15  10-15      Interval Training   Interval Training  No  No  No  No      Treadmill   MPH  -  3.9  3.5  3.3    Grade  -  '1  1  3    '$ Minutes  -  '15  15  15    '$ METs  -  5.2  4.16  4.89      Recumbant Bike   Level  6  -  4  -    Minutes  15  -  15  -    METs  4.36  -  4.4  -      NuStep   Level  -  -  6  -    Minutes  -  -  15  -    METs  -  -  6.6  -      Recumbant Elliptical   Level  10  2  -  -    Minutes  15  15  -  -    METs  6  3  -  -      T5 Nustep   Level  -  -  7  -    Minutes  -  -  15  -    METs  -  -  5.1  -       Home Exercise Plan   Plans to continue exercise at  Home (comment) walking  Home (comment) walking  Home (comment) walking  Home (comment) walking    Frequency  Add 2 additional days to program exercise sessions.  Add 2 additional days to program exercise sessions.  Add 2 additional days to program exercise sessions.  Add 2 additional days to program exercise sessions.    Initial Home Exercises Provided  05/08/19  05/08/19  05/08/19  05/08/19       Exercise Comments: Exercise Comments    Row Name 05/01/19 0948           Exercise Comments  First full day of exercise!  Patient was oriented to gym and equipment including functions, settings, policies, and procedures.  Patient's individual exercise prescription and treatment plan were reviewed.  All starting workloads were established based on the results of the 6 minute walk test done at initial orientation visit.  The plan for exercise progression was also introduced and progression will be customized based on patient's performance and goals.          Exercise Goals and Review: Exercise Goals    Row Name 05/01/19 1021             Exercise Goals   Increase Physical Activity  Yes       Intervention  Provide advice, education, support and counseling about physical activity/exercise needs.;Develop an individualized exercise prescription for aerobic and resistive training based on initial evaluation findings, risk stratification, comorbidities and participant's  personal goals.       Expected Outcomes  Short Term: Attend rehab on a regular basis to increase amount of physical activity.;Long Term: Add in home exercise to make exercise part of routine and to increase amount of physical activity.;Long Term: Exercising regularly at least 3-5 days a week.       Increase Strength and Stamina  Yes       Intervention  Provide advice, education, support and counseling about physical activity/exercise needs.;Develop an individualized exercise  prescription for aerobic and resistive training based on initial evaluation findings, risk stratification, comorbidities and participant's personal goals.       Expected Outcomes  Short Term: Increase workloads from initial exercise prescription for resistance, speed, and METs.;Short Term: Perform resistance training exercises routinely during rehab and add in resistance training at home;Long Term: Improve cardiorespiratory fitness, muscular endurance and strength as measured by increased METs and functional capacity (6MWT)       Able to understand and use rate of perceived exertion (RPE) scale  Yes       Intervention  Provide education and explanation on how to use RPE scale       Expected Outcomes  Short Term: Able to use RPE daily in rehab to express subjective intensity level;Long Term:  Able to use RPE to guide intensity level when exercising independently       Able to understand and use Dyspnea scale  Yes       Intervention  Provide education and explanation on how to use Dyspnea scale       Expected Outcomes  Short Term: Able to use Dyspnea scale daily in rehab to express subjective sense of shortness of breath during exertion;Long Term: Able to use Dyspnea scale to guide intensity level when exercising independently       Knowledge and understanding of Target Heart Rate Range (THRR)  Yes       Intervention  Provide education and explanation of THRR including how the numbers were predicted and where they are located for reference       Expected Outcomes  Short Term: Able to state/look up THRR;Short Term: Able to use daily as guideline for intensity in rehab;Long Term: Able to use THRR to govern intensity when exercising independently       Able to check pulse independently  Yes       Intervention  Provide education and demonstration on how to check pulse in carotid and radial arteries.;Review the importance of being able to check your own pulse for safety during independent exercise        Expected Outcomes  Short Term: Able to explain why pulse checking is important during independent exercise;Long Term: Able to check pulse independently and accurately       Understanding of Exercise Prescription  Yes       Intervention  Provide education, explanation, and written materials on patient's individual exercise prescription       Expected Outcomes  Short Term: Able to explain program exercise prescription;Long Term: Able to explain home exercise prescription to exercise independently       Improve claudication pain toleration; Improve walking ability  Yes       Intervention  Participate in PAD/SET Rehab 2-3 days a week, walking at home as part of exercise prescription;Attend education sessions to aid in risk factor modification and understanding of disease process       Expected Outcomes  Short Term: Improve walking distance/time to onset of claudication pain;Long Term: Improve  walking ability and toleration to claudication          Exercise Goals Re-Evaluation : Exercise Goals Re-Evaluation    Row Name 05/01/19 1026 05/08/19 1022 05/10/19 0847 05/18/19 1413 05/31/19 1225     Exercise Goal Re-Evaluation   Exercise Goals Review  Increase Physical Activity;Increase Strength and Stamina;Understanding of Exercise Prescription  Increase Physical Activity;Increase Strength and Stamina;Understanding of Exercise Prescription;Able to understand and use rate of perceived exertion (RPE) scale;Knowledge and understanding of Target Heart Rate Range (THRR);Able to check pulse independently  Understanding of Exercise Prescription;Increase Strength and Stamina;Increase Physical Activity  Increase Physical Activity;Increase Strength and Stamina;Able to understand and use rate of perceived exertion (RPE) scale;Knowledge and understanding of Target Heart Rate Range (THRR);Able to check pulse independently;Understanding of Exercise Prescription  Increase Physical Activity;Increase Strength and  Stamina;Understanding of Exercise Prescription   Comments  Reviewed RPE scale, THR and program prescription with pt today.  Pt voiced understanding and was given a copy of goals to take home.  Reviewed home exercise with pt today.  Pt plans to walk and do yard work for exercise.  Reviewed THR, pulse, RPE, sign and symptoms, NTG use, and when to call 911 or MD.  Also discussed weather considerations and indoor options.  Pt voiced understanding.  Patient states that he will walk when he is done with rehab. Patient has a pulse ox to chech is heart rate and oxygen. Brian Mcclure will have home exercise when he has had a little more time to get use to rehab. Explained to patient why it is important to continue exercising when he is done with the program. He wants to be able to jog more.  Brian Mcclure tolerates exercise well and worked at around 4 METS.  Staff will work with him on getting back to jogging  Brian Mcclure has been doing well in rehab.  He is up to level 7 on the NuStep.  We will continue to monitor his progress.   Expected Outcomes  Short: Use RPE daily to regulate intensity. Long: Follow program prescription in THR.  Short: add 1-2 days of walking at home following home exercise guidlines. Long: Become indepenedent with exercise program.  Short: increase strength and stamina. Long: be able to jog.  Short - attend consistently Long - incorporate joggging into program  Short: Walk on vacation.  Long: Continue to improve stamina.   [place] Name 06/12/19 0941 06/13/19 1437 06/28/19 1521 07/28/19 1050 08/09/19 1518     Exercise Goal Re-Evaluation   Exercise Goals Review  Increase Physical Activity;Increase Strength and Stamina;Understanding of Exercise Prescription  Increase Physical Activity;Increase Strength and Stamina;Able to understand and use rate of perceived exertion (RPE) scale;Able to understand and use Dyspnea scale;Understanding of Exercise Prescription  Increase Physical Activity;Increase Strength and Stamina;Understanding  of Exercise Prescription  -  Increase Physical Activity;Increase Strength and Stamina;Able to understand and use rate of perceived exertion (RPE) scale;Able to understand and use Dyspnea scale;Knowledge and understanding of Target Heart Rate Range (THRR);Able to check pulse independently;Understanding of Exercise Prescription   Comments  Brian Mcclure continues to attend cardiac rehab regularly. He reports doing some form of exercise on all days he is not in class. He has made steady increases in his workloads in class and reports he feels he has had an increase in stamina and is able to do more at home without taking as many breaks.  Brian Mcclure was out for two weeks on vacation.  He has been able to maintain previous levels on machines  Brian Mcclure  is doing well in rehab.  He was able to return to his previous workloads and should now be ready to start to move up some more or talk about intervals.  We will continue to monitor his progress.  Out since last review.  Brian Mcclure missed some time due to the holidays but has maintained levels on machines.   Expected Outcomes  Short: continue to attend cariac rehab classes regularly. Long: Continue to impove stamina and become independent with exercise program.  Short - continueto attend consistently Long - increase MET level  Short: Talk about intervals.  Long: Continue to improve stamina.  -  Short - try intervals Long:  exercise consistently when not at program sessions   Row Name 08/23/19 1219 08/29/19 1136 09/06/19 0932 09/20/19 1426 09/29/19 1131     Exercise Goal Re-Evaluation   Exercise Goals Review  -  Increase Physical Activity;Increase Strength and Stamina;Understanding of Exercise Prescription  -  Increase Physical Activity;Increase Strength and Stamina;Understanding of Exercise Prescription  Increase Physical Activity;Increase Strength and Stamina;Understanding of Exercise Prescription   Comments  Out since last review due to COVID exposure.  Brian Mcclure is doing well in rehab. Marland KitchenHe is glad  to be getting back to regular class.  He has not been as consistent at home, but wants to get back to it.  He walks on his own and some at work too.  He feels that his strength and stamina are improving.  Brian Mcclure continues to make steady progress in rehab.  He varies TM speed and grade for variety.  He works at correct HR and RPE range  Brian Mcclure has been doing well in rehab.  He is up to level 6 on the NuStep.  We will continue to monitor his progress.  Brian Mcclure is doing well in rehab.  He is feeling better and stronger.  He stays busy, but no consistent exericse at home.  His job keeps him active.   Expected Outcomes  -  Short: Continue to add in more exercise at home. Long: Continue to improve strength.  Short - exercise consistently at home Long:  increase overall MET level  Short: Continue to improve on treadmill.  Long: Continue to improve stamina.  Short: Continue to try to add in exercise at home.  Long: Continue to improve stamin.   [place] Name 10/05/19 1306             Exercise Goal Re-Evaluation   Exercise Goals Review  Increase Physical Activity;Increase Strength and Stamina;Able to understand and use rate of perceived exertion (RPE) scale;Able to understand and use Dyspnea scale;Knowledge and understanding of Target Heart Rate Range (THRR);Able to check pulse independently;Understanding of Exercise Prescription       Comments  Brian Mcclure is up to level 8 on NS and 6 lb strength training.  Staff will review the importance of exercise for cardiovascular health since he i snot consistent with exercise at home.       Expected Outcomes  Short : exercise at home Long : maintain fitness when HT completed          Discharge Exercise Prescription (Final Exercise Prescription Changes): Exercise Prescription Changes - 10/05/19 1300      Response to Exercise   Blood Pressure (Admit)  118/62    Blood Pressure (Exercise)  142/60    Blood Pressure (Exit)  104/58    Heart Rate (Admit)  59 bpm    Heart Rate (Exercise)   113 bpm    Heart Rate (Exit)  80 bpm    Rating of Perceived Exertion (Exercise)  13    Symptoms  none    Duration  Continue with 30 min of aerobic exercise without signs/symptoms of physical distress.    Intensity  THRR unchanged      Progression   Progression  Continue to progress workloads to maintain intensity without signs/symptoms of physical distress.    Average METs  5.36      Resistance Training   Training Prescription  Yes    Weight   6 lb    Reps  10-15      Interval Training   Interval Training  No      Treadmill   MPH  3.3    Grade  3    Minutes  15    METs  4.89      Home Exercise Plan   Plans to continue exercise at  Home (comment)   walking   Frequency  Add 2 additional days to program exercise sessions.    Initial Home Exercises Provided  05/08/19       Nutrition:  Target Goals: Understanding of nutrition guidelines, daily intake of sodium '1500mg'$ , cholesterol '200mg'$ , calories 30% from fat and 7% or less from saturated fats, daily to have 5 or more servings of fruits and vegetables.  Biometrics: Pre Biometrics - 05/01/19 1021      Pre Biometrics   Height  6' 1.5" (1.867 m)    Weight  183 lb 3.2 oz (83.1 kg)    BMI (Calculated)  23.84    Single Leg Stand  30 seconds        Nutrition Therapy Plan and Nutrition Goals: Nutrition Therapy & Goals - 04/18/19 1209      Nutrition Therapy   Diet  low Na, HH diet    Drug/Food Interactions  Coumadin/Vit K    Protein (specify units)  65g    Fiber  30 grams    Whole Grain Foods  3 servings    Saturated Fats  12 max. grams    Fruits and Vegetables  5 servings/day    Sodium  1.5 grams      Personal Nutrition Goals   Nutrition Goal  ST: eat salads 2x/week LT: feel better    Comments  Pt reports working all day doing chores. Pt reports that he sometimes does eat breakfast, but if he does he will have a bagel and cream cheese or ham biscuit. L and D is usually chicken and a starch, pt reports liking  vegetables. Dessert is usually some chocolate and ~10 vanilla wafers with peanut butter. Discussed HH eating. Pt reports he thinks his diet is ok but he should eat more salad. Discussed consistency and vitaminK and medication.      Intervention Plan   Intervention  Prescribe, educate and counsel regarding individualized specific dietary modifications aiming towards targeted core components such as weight, hypertension, lipid management, diabetes, heart failure and other comorbidities.;Nutrition handout(s) given to patient.    Expected Outcomes  Short Term Goal: Understand basic principles of dietary content, such as calories, fat, sodium, cholesterol and nutrients.;Short Term Goal: A plan has been developed with personal nutrition goals set during dietitian appointment.;Long Term Goal: Adherence to prescribed nutrition plan.       Nutrition Assessments: Nutrition Assessments - 05/01/19 1023      MEDFICTS Scores   Pre Score  38       Nutrition Goals Re-Evaluation: Nutrition Goals Re-Evaluation    Row Name 05/22/19  1024 06/14/19 3662 07/31/19 1530 09/05/19 1141       Goals   Nutrition Goal  ST: eat salads 2x/week LT: feel better  ST: include more vegetables - add 2x/week additional servings LT: feel better  ST: continue to include a variety of foods LT: feel better  ST: continue to include a variety of foods LT: feel better    Comment  Continue with current changes  Pt reports diet is not as good as it could be. Pt able to add salads in some of the time. Pt reports diet stayed mostly the same since we last spoke. Pt reports that he wants to get in more vegetables, but it is hard with a small household (preparing full meals), discussed easy ways to include vegetables into meals he is already having. Pt reported concenr for medication and vitamin K again, reiterated thagt it was about consistency and if he wanted to increase his intake of these foods he would just have to be consistent and talk to  his doctor beforehand.  Pt reports eating more venison - making soup out of that with more vegetables. pt reports energy is medium - about the same as before. Pt reports strength is good.  Pt reports that his diet is going well. No questions or concerns. Energy level is stable and strength is good.    Expected Outcome  ST: eat salads 2x/week LT: feel better  ST: include more vegetables - add 2x/week additional servings LT: feel better  ST: continue to include a variety of foods LT: feel better  ST: continue to include a variety of foods LT: feel better       Nutrition Goals Discharge (Final Nutrition Goals Re-Evaluation): Nutrition Goals Re-Evaluation - 09/05/19 1141      Goals   Nutrition Goal  ST: continue to include a variety of foods LT: feel better    Comment  Pt reports that his diet is going well. No questions or concerns. Energy level is stable and strength is good.    Expected Outcome  ST: continue to include a variety of foods LT: feel better       Psychosocial: Target Goals: Acknowledge presence or absence of significant depression and/or stress, maximize coping skills, provide positive support system. Participant is able to verbalize types and ability to use techniques and skills needed for reducing stress and depression.   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 He is hoping being here will help him get stronger and increase his stamina      Family Dynamics   Good Support System?  Yes      Barriers   Psychosocial barriers to participate in program  There are no identifiable barriers or psychosocial needs.;The patient should benefit from training in stress management and relaxation.      Screening Interventions    Interventions  Encouraged to exercise;Program counselor consult;To provide support and resources with identified psychosocial needs    Expected Outcomes  Short Term goal: Utilizing psychosocial counselor, staff and physician to assist with identification of specific Stressors or current issues interfering with healing process. Setting desired goal for each stressor or current issue identified.;Long Term Goal: Stressors or current issues  are controlled or eliminated.;Short Term goal: Identification and review with participant of any Quality of Life or Depression concerns found by scoring the questionnaire.;Long Term goal: The participant improves quality of Life and PHQ9 Scores as seen by post scores and/or verbalization of changes       Quality of Life Scores:  Quality of Life - 05/01/19 1022      Quality of Life   Select  Quality of Life      Quality of Life Scores   Health/Function Pre  23.53 %    Socioeconomic Pre  24.42 %    Psych/Spiritual Pre  27 %    Family Pre  27.1 %    GLOBAL Pre  24.97 %      Scores of 19 and below usually indicate a poorer quality of life in these areas.  A difference of  2-3 points is a clinically meaningful difference.  A difference of 2-3 points in the total score of the Quality of Life Index has been associated with significant improvement in overall quality of life, self-image, physical symptoms, and general health in studies assessing change in quality of life.  PHQ-9: Recent Review Flowsheet Data    Depression screen [facility] 2/9 05/01/2019   Decreased Interest 0   Down, Depressed, Hopeless 0   PHQ - 2 Score 0   Altered sleeping 0   Tired, decreased energy 1   Change in appetite 1   Feeling bad or failure about yourself  0   Trouble concentrating 0   Moving slowly or fidgety/restless 0   Suicidal thoughts 0   PHQ-9 Score 2   Difficult doing work/chores Somewhat difficult     Interpretation of Total Score  Total Score Depression Severity:  1-4 =  Minimal depression, 5-9 = Mild depression, 10-14 = Moderate depression, 15-19 = Moderately severe depression, 20-27 = Severe depression   Psychosocial Evaluation and Intervention: Psychosocial Evaluation - 04/17/19 1416      Psychosocial Evaluation & Interventions   Comments  Feels behind of doing things around the house and 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 He is hoping being here will help him get stronger and increase his stamina    Expected Outcomes  Short: attend [facility] on consistent basis to achieve goals. Long: develop self care habits    Continue Psychosocial Services   Follow up required by staff       Psychosocial Re-Evaluation: Psychosocial Re-Evaluation    [place] Name 05/10/19 [phone] 06/12/19 0959 08/29/19 1138 09/29/19 1132       Psychosocial Re-Evaluation   Current issues with  Current Stress Concerns  Current Stress Concerns  None Identified  None Identified    Comments  Patient wants to get his health back on track. He wants to do things like remodel and downsize but feels like he does not have enough time left. He feels like he is not checking off things off his bucket list. Jenny Reichmann is retired and finding himself very busy and not enough time to do things around the house.  Patient stated that he has no new stress concerns and feels he is able to manage this aspect of his life. He stated that he feels his stamina is increasing and he can work on project at more of a steady pace now and take fewer breaks. He reports sleeping well at night.  Brian Mcclure is doing well mentally.  He is glad to be getting back  to a routine again.  He sleeps well and denies any major stressors.  Brian Mcclure continues to do well mentally.  He is enjoying coming to class and sleeping well.  He is not looking forward to pollen season.    Expected Outcomes  Short: continue to exercise to reduce stress. Long: maintain exercise to keep stress at a minimum.  Short: continue to  exercise to reduce stress. Long: maintain exercise to keep stress at a minimum.  Short: Continue to exercise Long: Continue to stay positive and get into routine.  Short: Continue to exercise Long: Continue to stay positive and get into routine.    Interventions  Encouraged to attend Cardiac Rehabilitation for the exercise  -  Encouraged to attend Cardiac Rehabilitation for the exercise  Encouraged to attend Cardiac Rehabilitation for the exercise    Continue Psychosocial Services   Follow up required by staff  Follow up required by staff  Follow up required by staff  -       Psychosocial Discharge (Final Psychosocial Re-Evaluation): Psychosocial Re-Evaluation - 09/29/19 1132      Psychosocial Re-Evaluation   Current issues with  None Identified    Comments  Brian Mcclure continues to do well mentally.  He is enjoying coming to class and sleeping well.  He is not looking forward to pollen season.    Expected Outcomes  Short: Continue to exercise Long: Continue to stay positive and get into routine.    Interventions  Encouraged to attend Cardiac Rehabilitation for the exercise       Vocational Rehabilitation: Provide vocational rehab assistance to qualifying candidates.   Vocational Rehab Evaluation & Intervention: Vocational Rehab - 04/17/19 1406      Initial Vocational Rehab Evaluation & Intervention   Assessment shows need for Vocational Rehabilitation  No       Education: Education Goals: Education classes will be provided on a variety of topics geared toward better understanding of heart health and risk factor modification. Participant will state understanding/return demonstration of topics presented as noted by education test scores.  Learning Barriers/Preferences: Learning Barriers/Preferences - 04/17/19 1406      Learning Barriers/Preferences   Learning Barriers  None    Learning Preferences  Individual Instruction       Education Topics:  AED/CPR: - Group verbal and written  instruction with the use of models to demonstrate the basic use of the AED with the basic ABC's of resuscitation.   General Nutrition Guidelines/Fats and Fiber: -Group instruction provided by verbal, written material, models and posters to present the general guidelines for heart healthy nutrition. Gives an explanation and review of dietary fats and fiber.   Controlling Sodium/Reading Food Labels: -Group verbal and written material supporting the discussion of sodium use in heart healthy nutrition. Review and explanation with models, verbal and written materials for utilization of the food label.   Exercise Physiology & General Exercise Guidelines: - Group verbal and written instruction with models to review the exercise physiology of the cardiovascular system and associated critical values. Provides general exercise guidelines with specific guidelines to those with heart or lung disease.    Aerobic Exercise & Resistance Training: - Gives group verbal and written instruction on the various components of exercise. Focuses on aerobic and resistive training programs and the benefits of this training and how to safely progress through these programs..   Flexibility, Balance, Mind/Body Relaxation: Provides group verbal/written instruction on the benefits of flexibility and balance training, including mind/body exercise modes such as yoga, pilates  and tai chi.  Demonstration and skill practice provided.   Stress and Anxiety: - Provides group verbal and written instruction about the health risks of elevated stress and causes of high stress.  Discuss the correlation between heart/lung disease and anxiety and treatment options. Review healthy ways to manage with stress and anxiety.   Depression: - Provides group verbal and written instruction on the correlation between heart/lung disease and depressed mood, treatment options, and the stigmas associated with seeking treatment.   Anatomy &  Physiology of the Heart: - Group verbal and written instruction and models provide basic cardiac anatomy and physiology, with the coronary electrical and arterial systems. Review of Valvular disease and Heart Failure   Cardiac Procedures: - Group verbal and written instruction to review commonly prescribed medications for heart disease. Reviews the medication, class of the drug, and side effects. Includes the steps to properly store meds and maintain the prescription regimen. (beta blockers and nitrates)   Cardiac Medications I: - Group verbal and written instruction to review commonly prescribed medications for heart disease. Reviews the medication, class of the drug, and side effects. Includes the steps to properly store meds and maintain the prescription regimen.   Cardiac Medications II: -Group verbal and written instruction to review commonly prescribed medications for heart disease. Reviews the medication, class of the drug, and side effects. (all other drug classes)    Go Sex-Intimacy & Heart Disease, Get SMART - Goal Setting: - Group verbal and written instruction through game format to discuss heart disease and the return to sexual intimacy. Provides group verbal and written material to discuss and apply goal setting through the application of the S.M.A.R.T. Method.   Other Matters of the Heart: - Provides group verbal, written materials and models to describe Stable Angina and Peripheral Artery. Includes description of the disease process and treatment options available to the cardiac patient.   Exercise & Equipment Safety: - Individual verbal instruction and demonstration of equipment use and safety with use of the equipment.   Infection Prevention: - Provides verbal and written material to individual with discussion of infection control including proper hand washing and proper equipment cleaning during exercise session.   Falls Prevention: - Provides verbal and written  material to individual with discussion of falls prevention and safety.   Diabetes: - Individual verbal and written instruction to review signs/symptoms of diabetes, desired ranges of glucose level fasting, after meals and with exercise. Acknowledge that pre and post exercise glucose checks will be done for 3 sessions at entry of program.   Know Your Numbers and Risk Factors: -Group verbal and written instruction about important numbers in your health.  Discussion of what are risk factors and how they play a role in the disease process.  Review of Cholesterol, Blood Pressure, Diabetes, and BMI and the role they play in your overall health.   Sleep Hygiene: -Provides group verbal and written instruction about how sleep can affect your health.  Define sleep hygiene, discuss sleep cycles and impact of sleep habits. Review good sleep hygiene tips.    Other: -Provides group and verbal instruction on various topics (see comments)   Knowledge Questionnaire Score: Knowledge Questionnaire Score - 05/01/19 1023      Knowledge Questionnaire Score   Pre Score  21/26 education focus: A&P, PAD, Nutrition, Exercise       Core Components/Risk Factors/Patient Goals at Admission: Personal Goals and Risk Factors at Admission - 05/01/19 1024      Core Components/Risk Factors/Patient Goals  on Admission    Weight Management  Yes;Weight Maintenance    Intervention  Weight Management: Develop a combined nutrition and exercise program designed to reach desired caloric intake, while maintaining appropriate intake of nutrient and fiber, sodium and fats, and appropriate energy expenditure required for the weight goal.;Weight Management: Provide education and appropriate resources to help participant work on and attain dietary goals.    Admit Weight  183 lb 3.2 oz (83.1 kg)    Goal Weight: Short Term  183 lb (83 kg)    Goal Weight: Long Term  183 lb (83 kg)    Expected Outcomes  Short Term: Continue to assess  and modify interventions until short term weight is achieved;Long Term: Adherence to nutrition and physical activity/exercise program aimed toward attainment of established weight goal;Weight Maintenance: Understanding of the daily nutrition guidelines, which includes 25-35% calories from fat, 7% or less cal from saturated fats, less than '200mg'$  cholesterol, less than 1.5gm of sodium, & 5 or more servings of fruits and vegetables daily    Number of packs per day  1 pack/ day    Intervention  Assist the participant in steps to quit. Provide individualized education and counseling about committing to Tobacco Cessation, relapse prevention, and pharmacological support that can be provided by physician.;Advice worker, assist with locating and accessing local/national Quit Smoking programs, and support quit date choice.    Expected Outcomes  Short Term: Will demonstrate readiness to quit, by selecting a quit date.;Short Term: Will quit all tobacco product use, adhering to prevention of relapse plan.;Long Term: Complete abstinence from all tobacco products for at least 12 months from quit date.    Lipids  Yes    Intervention  Provide education and support for participant on nutrition & aerobic/resistive exercise along with prescribed medications to achieve LDL '70mg'$ , HDL >'40mg'$ .    Expected Outcomes  Short Term: Participant states understanding of desired cholesterol values and is compliant with medications prescribed. Participant is following exercise prescription and nutrition guidelines.;Long Term: Cholesterol controlled with medications as prescribed, with individualized exercise RX and with personalized nutrition plan. Value goals: LDL < '70mg'$ , HDL > 40 mg.       Core Components/Risk Factors/Patient Goals Review:  Goals and Risk Factor Review    Row Name 05/10/19 0858 06/12/19 0943 08/29/19 1141 09/29/19 1132       Core Components/Risk Factors/Patient Goals Review   Personal Goals Review   Weight Management/Obesity;Improve shortness of breath with ADL's;Tobacco Cessation;Lipids  Weight Management/Obesity;Tobacco Cessation;Lipids;Improve shortness of breath with ADL's  Weight Management/Obesity;Tobacco Cessation;Lipids;Improve shortness of breath with ADL's  Weight Management/Obesity;Tobacco Cessation;Lipids;Improve shortness of breath with ADL's    Review  Brian Mcclure smokes a pack a day and does not have any interest in quitting. He is managing his weight and staying around 183 pounds. His lipids are doing well and he is taking medication for it. He has labs done recently and he states it was below 55.  Brian Mcclure continues to maintain his weight, and take all medications as perscribed to control lipid levels. He continues to smoke a pack and day and is not interested in quitting. Patient stated that shortness of breath does not limit him so much, but more leg fatigue. He does feel that his stamina is increasing and he does not have to take as many rest breaks while doing physical work as he used to. He is now able to work at a steady pace.  Brian Mcclure is doing well in rehab. His weight is staying  steady.  His blood pressures continue do well.  He still has some SOB, but it has improved some since he has started.  He is still smoking 1 ppd and not ready to quit currently.  Brian Mcclure is doing well in rehab. His weight is up some, he is hoping its muscle.  He is running some on treadmill, but SOB still seems to be his limit.    He is currently on a statin vacation and his legs are feeling better.  He is hoping that they will either try something different or lower his dose once back on again.   Still at 1ppd and not ready to quit.    Expected Outcomes  Short: continue to exercise to improve ADL'S. Long: maintain ADL independently.  Short: continue to exercise to improve ADL'S. Long: maintain ADL independently.  Short: Continue to exercise to help with SOB.  Long: Continue to maintain exercise.  Short: Continue to exercise to  help with SOB.  Long: Continue to maintain exercise.       Core Components/Risk Factors/Patient Goals at Discharge (Final Review):  Goals and Risk Factor Review - 09/29/19 1132      Core Components/Risk Factors/Patient Goals Review   Personal Goals Review  Weight Management/Obesity;Tobacco Cessation;Lipids;Improve shortness of breath with ADL's    Review  Brian Mcclure is doing well in rehab. His weight is up some, he is hoping its muscle.  He is running some on treadmill, but SOB still seems to be his limit.    He is currently on a statin vacation and his legs are feeling better.  He is hoping that they will either try something different or lower his dose once back on again.   Still at 1ppd and not ready to quit.    Expected Outcomes  Short: Continue to exercise to help with SOB.  Long: Continue to maintain exercise.       ITP Comments: ITP Comments    Row Name 04/17/19 1401 04/18/19 1211 04/18/19 1311 04/26/19 1112 05/01/19 0947   ITP Comments  Virtual Initial Oriention completed. Diagnosis can be found in [facility] 8/23. EP/RD orientation scheduled fro 9/29 at 11 am  completed RD initial eval  When patient arrived today patient reported to Nada Maclachlan, EP, that he had not taken his Plavix in 3 days due to being out of the medication.  The reason reported was he had not yet received his medications in the mail from the [place].  Patient reported this is the only medication he has missed.  Patient no longer taking baby aspirin as he was advised to stop Aspirin after one month.  He is also on Coumadin due to afib / flutter.  Sent secure chat message to Ronda Fairly, RN and Ignacia Bayley, NP at [facility] to request Plavix prescription be called in to [facility] in [place].  Patient has [employer] in addition to the [place]. Verlon Au, RN responded on [employer] letting me know she had called the prescription in.  Patient instructed to go pick up Plavix prescription and take medication today.   Note:  Patient did not exercise today.     Patient will return on Monday, April 24, 2019 to start Cardiac Rehab.  30 day review completed. ITP sent to [name], Medical Director of Cardiac and Pulmonary Rehab. Continue with ITP unless changes are made by physician.  Department closed starting 10/2 until further notice by infection prevention and Health at Work teams for [place].  First full day of exercise!  Patient was oriented to gym and equipment including functions, settings, policies, and procedures.  Patient's individual exercise prescription and treatment plan were reviewed.  All starting workloads were established based on the results of the 6 minute walk test done at initial orientation visit.  The plan for exercise progression was also introduced and progression will be customized based on patient's performance and goals.   Row Name 05/24/19 0637 05/31/19 1225 07/11/19 1447 07/19/19 0852 07/28/19 1049   ITP Comments  30 day review completed. Continue with ITP sent to [name], Medical Director of Cardiac and Pulmonary Rehab for review , changes as needed and signature.  Brian Mcclure is out of town for vacation for two weeks.  Brian Mcclure has not attended since 06/28/2019.  He plans to return after New Year.  30 day review competed . ITP sent to Dr Emily Filbert for review, changes as needed and ITP approval signature  Brian Mcclure has been out since 06/28/19 and plans to return on Monday 07/31/19.   [place] Name 08/15/19 1246 08/16/19 1229 09/13/19 0638 09/22/19 1119 10/11/19 0637   ITP Comments  Brian Mcclure called to inform staff that he has been around a COVID positive person. He cancelled his appointment for Monday and will keep [name] updated.  30 day review completed. ITP sent to [name], Medical Director of Cardiac and Pulmonary Rehab. Continue with ITP unless changes are made by physician.  Department operating under reduced schedule until further notice by request from hospital leadership.  30 day chart  review completed. ITP sent to Dr Zachery Dakins Medical Director, for review,changes as needed and signature.  Brian Mcclure is starting a 30 day Holter monitor this week  30 day chart review completed. ITP sent to Dr Zachery Dakins Medical Director, for review,changes as needed and signature. Continue with ITP if no changes requested      Comments:

## 2019-10-11 NOTE — Progress Notes (Signed)
Cardiac Individual Treatment Plan  Patient Details  Name: Brian Mcclure MRN: [id number] Date of Birth: 07/19/1956 Referring Provider:     Cardiac Rehab from 05/01/2019 in [facility] Cardiac and Pulmonary Rehab  Referring Provider  Lynda Rainwater, MD      Initial Encounter Date:    Cardiac Rehab from 05/01/2019 in [facility] Cardiac and Pulmonary Rehab  Date  05/01/19      Visit Diagnosis: ST elevation myocardial infarction (STEMI), unspecified artery ([place])  Patient's Home Medications on Admission:  Current Outpatient Medications:  .  Ca Carbonate-Mag Hydroxide (ROLAIDS PO), Take 1 tablet by mouth at bedtime as needed (heartburn/acid reflux)., Disp: , Rfl:  .  cholecalciferol (VITAMIN D) 25 MCG (1000 UT) tablet, Take 1,000 Units by mouth 2 (two) times daily., Disp: , Rfl:  .  clopidogrel (PLAVIX) 75 MG tablet, Take 1 tablet (75 mg total) by mouth daily., Disp: 30 tablet, Rfl: 1 .  diclofenac sodium (VOLTAREN) 1 % GEL, Apply 1 application topically daily as needed (pain)., Disp: , Rfl:  .  famotidine (PEPCID) 20 MG tablet, Take 20 mg by mouth daily. , Disp: , Rfl:  .  losartan (COZAAR) 25 MG tablet, Take 0.5 tablets (12.5 mg total) by mouth daily., Disp: 15 tablet, Rfl: 1 .  metoprolol succinate (TOPROL XL) 25 MG 24 hr tablet, Take 0.5 tablets (12.5 mg total) by mouth daily., Disp: 45 tablet, Rfl: 3 .  nitroGLYCERIN (NITROSTAT) 0.4 MG SL tablet, Place 1 tablet (0.4 mg total) under the tongue every 5 (five) minutes x 3 doses as needed for chest pain., Disp: 25 tablet, Rfl: 12 .  oxymetazoline (AFRIN) 0.05 % nasal spray, Place 1 spray into both nostrils 2 (two) times daily as needed for congestion., Disp: , Rfl:  .  rosuvastatin (CRESTOR) 40 MG tablet, Take 1 tablet (40 mg total) by mouth at bedtime., Disp: 30 tablet, Rfl: 1 .  Simethicone (GAS-X PO), Take 1 tablet by mouth 3 (three) times daily as needed (heartburn)., Disp: , Rfl:  .  warfarin (COUMADIN) 5 MG tablet, Take 1/2 tablet daily or as  directed by the anti-coag clinic., Disp: 50 tablet, Rfl: 0  Past Medical History: Past Medical History:  Diagnosis Date  . Actinic keratosis   . Acute ST elevation myocardial infarction (STEMI) of anterior wall ([place])    a. 02/2019 s/p PCI/DES ->LAD.  Marland Kitchen Apical mural thrombus following MI ([place])    a. 02/2019 s/p Ant MI-->Echo: Small, fixed thrombus on the apical wall of LV-->coumadin.  . Barrett's esophagus   . CAD (coronary artery disease)    a. 02/2019 Ant STEMI/PCI: LM nl, LAD 100p (4x18 Resolute Onyx DES), LCX/RCA min irregs, EF 35-45%.  . Chronic back pain   . Colon polyps    a. 07/2017 Colonoscopy: 7m sigmoid polyp, tubular adenoma, diverticulosis, int hemorrhoids.  .Marland KitchenCOPD (chronic obstructive pulmonary disease) ([place]   . Degeneration of lumbar intervertebral disc   . Dysphonia   . GERD (gastroesophageal reflux disease)   . HFrEF (heart failure with reduced ejection fraction) ([place]    a. 02/2019 Echo: EF 40-45%, apical, septal, inf, and inforlateral AK. Mid and apical anterior/mid anteroseptal AK. Mildly dil LA.  .Marland KitchenHydrocele   . Hyperlipidemia   . Ischemic cardiomyopathy    a. 02/2019 Echo: EF 40-45%.  . Myalgia   . Osteoarthritis    C-spine  . PAF (paroxysmal atrial fibrillation) ([facility])    a. CHA2DS2VASc = 2.  . Peripheral vascular disease ([place]    a.  2018 s/p aorto-iliac stent graft.  . Reactive airway disease   . Seborrheic keratosis   . Sinus bradycardia    a. 02/2019 Jxnl rhythm and sinus brady in setting of Ant MI-->CCB d/c'd.  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  . Tobacco use disorder   . Tremor     Tobacco Use: Social History   Tobacco Use  Smoking Status Current Every Day Smoker  . Packs/day: 1.00  . Years: 40.00  . Pack years: 40.00  . Types: Cigarettes  Smokeless Tobacco Never Used    Labs: Recent Review Flowsheet Data    Labs for ITP Cardiac and Pulmonary Rehab Latest Ref Rng & Units 03/12/2019 05/03/2019   Cholestrol 100 -  199 mg/dL 104 96(L)   LDLCALC 0 - 99 mg/dL 59 46   HDL >39 mg/dL 31(L) 34(L)   Trlycerides 0 - 149 mg/dL 70 79   Hemoglobin A1c 4.8 - 5.6 % 6.0(H) -   TCO2 22 - 32 mmol/L 18(L) -       Exercise Target Goals: Exercise Program Goal: Individual exercise prescription set using results from initial 6 min walk test and THRR while considering  patient's activity barriers and safety.   Exercise Prescription Goal: Initial exercise prescription builds to 30-45 minutes a day of aerobic activity, 2-3 days per week.  Home exercise guidelines will be given to patient during program as part of exercise prescription that the participant will acknowledge.  Activity Barriers & Risk Stratification: Activity Barriers & Cardiac Risk Stratification - 05/01/19 1017      Activity Barriers & Cardiac Risk Stratification   Activity Barriers  Joint Problems;Back Problems;Muscular Weakness;Deconditioning;Other (comment)    Comments  claudication pain    Cardiac Risk Stratification  High       6 Minute Walk: 6 Minute Walk    Row Name 05/01/19 1017         6 Minute Walk   Phase  Initial     Distance  1765 feet     Walk Time  6 minutes     # of Rest Breaks  0     MPH  3.34     METS  4.37     RPE  10     Perceived Dyspnea   1     VO2 Peak  15.3     Symptoms  Yes (comment)     Comments  claudication pain 3/5, SOB     Resting HR  52 bpm     Resting BP  118/64     Resting Oxygen Saturation   99 %     Exercise Oxygen Saturation  during 6 min walk  100 %     Max Ex. HR  92 bpm     Max Ex. BP  132/74     2 Minute Post BP  124/62        Oxygen Initial Assessment:   Oxygen Re-Evaluation:   Oxygen Discharge (Final Oxygen Re-Evaluation):   Initial Exercise Prescription: Initial Exercise Prescription - 05/01/19 1000      Date of Initial Exercise RX and Referring Provider   Date  05/01/19    Referring Provider  Lynda Rainwater, MD      Treadmill   MPH  3    Grade  1    Minutes  15    METs   3.71      Recumbant Bike   Level  3    RPM  50    Watts  63    Minutes  15    METs  3      NuStep   Level  4    SPM  80    Minutes  15    METs  3      Prescription Details   Frequency (times per week)  2    Duration  Progress to 30 minutes of continuous aerobic without signs/symptoms of physical distress      Intensity   THRR 40-80% of Max Heartrate  94-136    Ratings of Perceived Exertion  11-13    Perceived Dyspnea  0-4      Progression   Progression  Continue to progress workloads to maintain intensity without signs/symptoms of physical distress.      Resistance Training   Training Prescription  Yes    Weight  4 lbs    Reps  10-15       Perform Capillary Blood Glucose checks as needed.  Exercise Prescription Changes: Exercise Prescription Changes    Row Name 05/01/19 1000 05/18/19 1400 05/31/19 1200 06/13/19 1400 06/28/19 1500     Response to Exercise   Blood Pressure (Admit)  118/64  122/64  120/60  110/60  128/62   Blood Pressure (Exercise)  132/74  134/64  126/60  142/70  150/60   Blood Pressure (Exit)  124/62  96/56  110/62  122/64  130/62   Heart Rate (Admit)  52 bpm  83 bpm  69 bpm  61 bpm  70 bpm   Heart Rate (Exercise)  92 bpm  117 bpm  118 bpm  121 bpm  114 bpm   Heart Rate (Exit)  --  82 bpm  77 bpm  74 bpm  70 bpm   Oxygen Saturation (Admit)  99 %  --  --  --  --   Oxygen Saturation (Exercise)  100 %  --  --  --  --   Rating of Perceived Exertion (Exercise)  10  --  _0 Perceived Dyspnea (Exercise)  1  --  --  --  --   Symptoms  claudication pain 3/5, SOB  none  none  none  none   Comments  walk test results  --  --  --  --   Duration  --  Continue with 30 min of aerobic exercise without signs/symptoms of physical distress.  Continue with 30 min of aerobic exercise without signs/symptoms of physical distress.  Continue with 30 min of aerobic exercise without signs/symptoms of physical distress.  Continue with 30 min of aerobic exercise  without signs/symptoms of physical distress.   Intensity  --  THRR unchanged  THRR unchanged  THRR unchanged  THRR unchanged     Progression   Progression  --  Continue to progress workloads to maintain intensity without signs/symptoms of physical distress.  Continue to progress workloads to maintain intensity without signs/symptoms of physical distress.  Continue to progress workloads to maintain intensity without signs/symptoms of physical distress.  Continue to progress workloads to maintain intensity without signs/symptoms of physical distress.   Average METs  --  4.05  5.47  5.6  5.29     Resistance Training   Training Prescription  --  Yes  Yes  Yes  Yes   Weight  --  0 ROM only due to shoulder problem  6 lbs  6 lb  6 lb   Reps  --  10-15  10-15  10-15  10-15     Interval Training   Interval Training  --  --  No  No  No     Treadmill   MPH  --  3.5  3.5  3.5  3.5   Grade  --  '1  1  1  1   '$ Minutes  --  '15  15  15  15   '$ METs  --  4.16  4.16  4.16  4.16     Recumbant Bike   Level  --  --  '4  4  4   '$ Minutes  --  --  '15  15  15   '$ METs  --  --  5.5  5.5  --     NuStep   Level  --  --  '7  7  7   '$ Minutes  --  --  '15  15  15   '$ METs  --  --  6.8  6.6  8.7     Recumbant Elliptical   Level  --  --  --  --  4.5   Minutes  --  --  --  --  15   METs  --  --  --  --  3     T5 Nustep   Level  --  7  --  --  --   SPM  --  80  --  --  --   Minutes  --  15  --  --  --   METs  --  4  --  --  --     Home Exercise Plan   Plans to continue exercise at  --  --  Home (comment) walking  Home (comment) walking  Home (comment) walking   Frequency  --  --  Add 2 additional days to program exercise sessions.  Add 2 additional days to program exercise sessions.  Add 2 additional days to program exercise sessions.   Initial Home Exercises Provided  --  --  05/08/19  05/08/19  05/08/19   Row Name 08/09/19 1500 09/06/19 0900 09/20/19 1400 10/05/19 1300       Response to Exercise   Blood Pressure  (Admit)  130/70  126/64  126/60  118/62    Blood Pressure (Exercise)  132/58  152/74  124/70  142/60    Blood Pressure (Exit)  126/50  126/62  118/66  104/58    Heart Rate (Admit)  55 bpm  64 bpm  68 bpm  59 bpm    Heart Rate (Exercise)  108 bpm  112 bpm  112 bpm  113 bpm    Heart Rate (Exit)  89 bpm  86 bpm  76 bpm  80 bpm    Rating of Perceived Exertion (Exercise)  '12  12  13  13    '$ Symptoms  none  none  none   none    Duration  Continue with 30 min of aerobic exercise without signs/symptoms of physical distress.  Continue with 30 min of aerobic exercise without signs/symptoms of physical distress.  Continue with 30 min of aerobic exercise without signs/symptoms of physical distress.  Continue with 30 min of aerobic exercise without signs/symptoms of physical distress.    Intensity  THRR unchanged  THRR unchanged  THRR unchanged  THRR unchanged      Progression   Progression  Continue to progress workloads to maintain intensity without signs/symptoms of physical distress.  Continue to progress workloads to  maintain intensity without signs/symptoms of physical distress.  Continue to progress workloads to maintain intensity without signs/symptoms of physical distress.  Continue to progress workloads to maintain intensity without signs/symptoms of physical distress.    Average METs  5  5.2  5.06  5.36      Resistance Training   Training Prescription  Yes  Yes  Yes  Yes    Weight  6 lb  6 lb  6 lb   6 lb    Reps  10-15  10-15  10-15  10-15      Interval Training   Interval Training  No  No  No  No      Treadmill   MPH  --  3.9  3.5  3.3    Grade  --  '1  1  3    '$ Minutes  --  '15  15  15    '$ METs  --  5.2  4.16  4.89      Recumbant Bike   Level  6  --  4  --    Minutes  15  --  15  --    METs  4.36  --  4.4  --      NuStep   Level  --  --  6  --    Minutes  --  --  15  --    METs  --  --  6.6  --      Recumbant Elliptical   Level  10  2  --  --    Minutes  15  15  --  --    METs   6  3  --  --      T5 Nustep   Level  --  --  7  --    Minutes  --  --  15  --    METs  --  --  5.1  --      Home Exercise Plan   Plans to continue exercise at  Home (comment) walking  Home (comment) walking  Home (comment) walking  Home (comment) walking    Frequency  Add 2 additional days to program exercise sessions.  Add 2 additional days to program exercise sessions.  Add 2 additional days to program exercise sessions.  Add 2 additional days to program exercise sessions.    Initial Home Exercises Provided  05/08/19  05/08/19  05/08/19  05/08/19       Exercise Comments: Exercise Comments    Row Name 05/01/19 0948           Exercise Comments  First full day of exercise!  Patient was oriented to gym and equipment including functions, settings, policies, and procedures.  Patient's individual exercise prescription and treatment plan were reviewed.  All starting workloads were established based on the results of the 6 minute walk test done at initial orientation visit.  The plan for exercise progression was also introduced and progression will be customized based on patient's performance and goals.          Exercise Goals and Review: Exercise Goals    Row Name 05/01/19 1021             Exercise Goals   Increase Physical Activity  Yes       Intervention  Provide advice, education, support and counseling about physical activity/exercise needs.;Develop an individualized exercise prescription for aerobic and resistive training based on initial evaluation findings, risk stratification, comorbidities and participant's  personal goals.       Expected Outcomes  Short Term: Attend rehab on a regular basis to increase amount of physical activity.;Long Term: Add in home exercise to make exercise part of routine and to increase amount of physical activity.;Long Term: Exercising regularly at least 3-5 days a week.       Increase Strength and Stamina  Yes       Intervention  Provide advice,  education, support and counseling about physical activity/exercise needs.;Develop an individualized exercise prescription for aerobic and resistive training based on initial evaluation findings, risk stratification, comorbidities and participant's personal goals.       Expected Outcomes  Short Term: Increase workloads from initial exercise prescription for resistance, speed, and METs.;Short Term: Perform resistance training exercises routinely during rehab and add in resistance training at home;Long Term: Improve cardiorespiratory fitness, muscular endurance and strength as measured by increased METs and functional capacity (6MWT)       Able to understand and use rate of perceived exertion (RPE) scale  Yes       Intervention  Provide education and explanation on how to use RPE scale       Expected Outcomes  Short Term: Able to use RPE daily in rehab to express subjective intensity level;Long Term:  Able to use RPE to guide intensity level when exercising independently       Able to understand and use Dyspnea scale  Yes       Intervention  Provide education and explanation on how to use Dyspnea scale       Expected Outcomes  Short Term: Able to use Dyspnea scale daily in rehab to express subjective sense of shortness of breath during exertion;Long Term: Able to use Dyspnea scale to guide intensity level when exercising independently       Knowledge and understanding of Target Heart Rate Range (THRR)  Yes       Intervention  Provide education and explanation of THRR including how the numbers were predicted and where they are located for reference       Expected Outcomes  Short Term: Able to state/look up THRR;Short Term: Able to use daily as guideline for intensity in rehab;Long Term: Able to use THRR to govern intensity when exercising independently       Able to check pulse independently  Yes       Intervention  Provide education and demonstration on how to check pulse in carotid and radial  arteries.;Review the importance of being able to check your own pulse for safety during independent exercise       Expected Outcomes  Short Term: Able to explain why pulse checking is important during independent exercise;Long Term: Able to check pulse independently and accurately       Understanding of Exercise Prescription  Yes       Intervention  Provide education, explanation, and written materials on patient's individual exercise prescription       Expected Outcomes  Short Term: Able to explain program exercise prescription;Long Term: Able to explain home exercise prescription to exercise independently       Improve claudication pain toleration; Improve walking ability  Yes       Intervention  Participate in PAD/SET Rehab 2-3 days a week, walking at home as part of exercise prescription;Attend education sessions to aid in risk factor modification and understanding of disease process       Expected Outcomes  Short Term: Improve walking distance/time to onset of claudication pain;Long Term: Improve  walking ability and toleration to claudication          Exercise Goals Re-Evaluation : Exercise Goals Re-Evaluation    Row Name 05/01/19 1026 05/08/19 1022 05/10/19 0847 05/18/19 1413 05/31/19 1225     Exercise Goal Re-Evaluation   Exercise Goals Review  Increase Physical Activity;Increase Strength and Stamina;Understanding of Exercise Prescription  Increase Physical Activity;Increase Strength and Stamina;Understanding of Exercise Prescription;Able to understand and use rate of perceived exertion (RPE) scale;Knowledge and understanding of Target Heart Rate Range (THRR);Able to check pulse independently  Understanding of Exercise Prescription;Increase Strength and Stamina;Increase Physical Activity  Increase Physical Activity;Increase Strength and Stamina;Able to understand and use rate of perceived exertion (RPE) scale;Knowledge and understanding of Target Heart Rate Range (THRR);Able to check pulse  independently;Understanding of Exercise Prescription  Increase Physical Activity;Increase Strength and Stamina;Understanding of Exercise Prescription   Comments  Reviewed RPE scale, THR and program prescription with pt today.  Pt voiced understanding and was given a copy of goals to take home.  Reviewed home exercise with pt today.  Pt plans to walk and do yard work for exercise.  Reviewed THR, pulse, RPE, sign and symptoms, NTG use, and when to call 911 or MD.  Also discussed weather considerations and indoor options.  Pt voiced understanding.  Patient states that he will walk when he is done with rehab. Patient has a pulse ox to chech is heart rate and oxygen. Brian Mcclure will have home exercise when he has had a little more time to get use to rehab. Explained to patient why it is important to continue exercising when he is done with the program. He wants to be able to jog more.  Brian Mcclure tolerates exercise well and worked at around 4 METS.  Staff will work with him on getting back to jogging  Brian Mcclure has been doing well in rehab.  He is up to level 7 on the NuStep.  We will continue to monitor his progress.   Expected Outcomes  Short: Use RPE daily to regulate intensity. Long: Follow program prescription in THR.  Short: add 1-2 days of walking at home following home exercise guidlines. Long: Become indepenedent with exercise program.  Short: increase strength and stamina. Long: be able to jog.  Short - attend consistently Long - incorporate joggging into program  Short: Walk on vacation.  Long: Continue to improve stamina.   [place] Name 06/12/19 0941 06/13/19 1437 06/28/19 1521 07/28/19 1050 08/09/19 1518     Exercise Goal Re-Evaluation   Exercise Goals Review  Increase Physical Activity;Increase Strength and Stamina;Understanding of Exercise Prescription  Increase Physical Activity;Increase Strength and Stamina;Able to understand and use rate of perceived exertion (RPE) scale;Able to understand and use Dyspnea  scale;Understanding of Exercise Prescription  Increase Physical Activity;Increase Strength and Stamina;Understanding of Exercise Prescription  --  Increase Physical Activity;Increase Strength and Stamina;Able to understand and use rate of perceived exertion (RPE) scale;Able to understand and use Dyspnea scale;Knowledge and understanding of Target Heart Rate Range (THRR);Able to check pulse independently;Understanding of Exercise Prescription   Comments  Brian Mcclure continues to attend cardiac rehab regularly. He reports doing some form of exercise on all days he is not in class. He has made steady increases in his workloads in class and reports he feels he has had an increase in stamina and is able to do more at home without taking as many breaks.  Brian Mcclure was out for two weeks on vacation.  He has been able to maintain previous levels on machines  Brian Mcclure  is doing well in rehab.  He was able to return to his previous workloads and should now be ready to start to move up some more or talk about intervals.  We will continue to monitor his progress.  Out since last review.  Brian Mcclure missed some time due to the holidays but has maintained levels on machines.   Expected Outcomes  Short: continue to attend cariac rehab classes regularly. Long: Continue to impove stamina and become independent with exercise program.  Short - continueto attend consistently Long - increase MET level  Short: Talk about intervals.  Long: Continue to improve stamina.  --  Short - try intervals Long:  exercise consistently when not at program sessions   Row Name 08/23/19 1219 08/29/19 1136 09/06/19 0932 09/20/19 1426 09/29/19 1131     Exercise Goal Re-Evaluation   Exercise Goals Review  --  Increase Physical Activity;Increase Strength and Stamina;Understanding of Exercise Prescription  --  Increase Physical Activity;Increase Strength and Stamina;Understanding of Exercise Prescription  Increase Physical Activity;Increase Strength and Stamina;Understanding of  Exercise Prescription   Comments  Out since last review due to COVID exposure.  Brian Mcclure is doing well in rehab. Marland KitchenHe is glad to be getting back to regular class.  He has not been as consistent at home, but wants to get back to it.  He walks on his own and some at work too.  He feels that his strength and stamina are improving.  Brian Mcclure continues to make steady progress in rehab.  He varies TM speed and grade for variety.  He works at correct HR and RPE range  Brian Mcclure has been doing well in rehab.  He is up to level 6 on the NuStep.  We will continue to monitor his progress.  Brian Mcclure is doing well in rehab.  He is feeling better and stronger.  He stays busy, but no consistent exericse at home.  His job keeps him active.   Expected Outcomes  --  Short: Continue to add in more exercise at home. Long: Continue to improve strength.  Short - exercise consistently at home Long:  increase overall MET level  Short: Continue to improve on treadmill.  Long: Continue to improve stamina.  Short: Continue to try to add in exercise at home.  Long: Continue to improve stamin.   [place] Name 10/05/19 1306             Exercise Goal Re-Evaluation   Exercise Goals Review  Increase Physical Activity;Increase Strength and Stamina;Able to understand and use rate of perceived exertion (RPE) scale;Able to understand and use Dyspnea scale;Knowledge and understanding of Target Heart Rate Range (THRR);Able to check pulse independently;Understanding of Exercise Prescription       Comments  Brian Mcclure is up to level 8 on NS and 6 lb strength training.  Staff will review the importance of exercise for cardiovascular health since he i snot consistent with exercise at home.       Expected Outcomes  Short : exercise at home Long : maintain fitness when HT completed          Discharge Exercise Prescription (Final Exercise Prescription Changes): Exercise Prescription Changes - 10/05/19 1300      Response to Exercise   Blood Pressure (Admit)  118/62    Blood  Pressure (Exercise)  142/60    Blood Pressure (Exit)  104/58    Heart Rate (Admit)  59 bpm    Heart Rate (Exercise)  113 bpm    Heart Rate (Exit)  80 bpm    Rating of Perceived Exertion (Exercise)  13    Symptoms  none    Duration  Continue with 30 min of aerobic exercise without signs/symptoms of physical distress.    Intensity  THRR unchanged      Progression   Progression  Continue to progress workloads to maintain intensity without signs/symptoms of physical distress.    Average METs  5.36      Resistance Training   Training Prescription  Yes    Weight   6 lb    Reps  10-15      Interval Training   Interval Training  No      Treadmill   MPH  3.3    Grade  3    Minutes  15    METs  4.89      Home Exercise Plan   Plans to continue exercise at  Home (comment)   walking   Frequency  Add 2 additional days to program exercise sessions.    Initial Home Exercises Provided  05/08/19       Nutrition:  Target Goals: Understanding of nutrition guidelines, daily intake of sodium '1500mg'$ , cholesterol '200mg'$ , calories 30% from fat and 7% or less from saturated fats, daily to have 5 or more servings of fruits and vegetables.  Biometrics: Pre Biometrics - 05/01/19 1021      Pre Biometrics   Height  6' 1.5" (1.867 m)    Weight  183 lb 3.2 oz (83.1 kg)    BMI (Calculated)  23.84    Single Leg Stand  30 seconds        Nutrition Therapy Plan and Nutrition Goals: Nutrition Therapy & Goals - 04/18/19 1209      Nutrition Therapy   Diet  low Na, HH diet    Drug/Food Interactions  Coumadin/Vit K    Protein (specify units)  65g    Fiber  30 grams    Whole Grain Foods  3 servings    Saturated Fats  12 max. grams    Fruits and Vegetables  5 servings/day    Sodium  1.5 grams      Personal Nutrition Goals   Nutrition Goal  ST: eat salads 2x/week LT: feel better    Comments  Pt reports working all day doing chores. Pt reports that he sometimes does eat breakfast, but if he does  he will have a bagel and cream cheese or ham biscuit. L and D is usually chicken and a starch, pt reports liking vegetables. Dessert is usually some chocolate and ~10 vanilla wafers with peanut butter. Discussed HH eating. Pt reports he thinks his diet is ok but he should eat more salad. Discussed consistency and vitaminK and medication.      Intervention Plan   Intervention  Prescribe, educate and counsel regarding individualized specific dietary modifications aiming towards targeted core components such as weight, hypertension, lipid management, diabetes, heart failure and other comorbidities.;Nutrition handout(s) given to patient.    Expected Outcomes  Short Term Goal: Understand basic principles of dietary content, such as calories, fat, sodium, cholesterol and nutrients.;Short Term Goal: A plan has been developed with personal nutrition goals set during dietitian appointment.;Long Term Goal: Adherence to prescribed nutrition plan.       Nutrition Assessments: Nutrition Assessments - 05/01/19 1023      MEDFICTS Scores   Pre Score  38       Nutrition Goals Re-Evaluation: Nutrition Goals Re-Evaluation    Row Name 05/22/19  1024 06/14/19 5638 07/31/19 1530 09/05/19 1141       Goals   Nutrition Goal  ST: eat salads 2x/week LT: feel better  ST: include more vegetables - add 2x/week additional servings LT: feel better  ST: continue to include a variety of foods LT: feel better  ST: continue to include a variety of foods LT: feel better    Comment  Continue with current changes  Pt reports diet is not as good as it could be. Pt able to add salads in some of the time. Pt reports diet stayed mostly the same since we last spoke. Pt reports that he wants to get in more vegetables, but it is hard with a small household (preparing full meals), discussed easy ways to include vegetables into meals he is already having. Pt reported concenr for medication and vitamin K again, reiterated thagt it was about  consistency and if he wanted to increase his intake of these foods he would just have to be consistent and talk to his doctor beforehand.  Pt reports eating more venison - making soup out of that with more vegetables. pt reports energy is medium - about the same as before. Pt reports strength is good.  Pt reports that his diet is going well. No questions or concerns. Energy level is stable and strength is good.    Expected Outcome  ST: eat salads 2x/week LT: feel better  ST: include more vegetables - add 2x/week additional servings LT: feel better  ST: continue to include a variety of foods LT: feel better  ST: continue to include a variety of foods LT: feel better       Nutrition Goals Discharge (Final Nutrition Goals Re-Evaluation): Nutrition Goals Re-Evaluation - 09/05/19 1141      Goals   Nutrition Goal  ST: continue to include a variety of foods LT: feel better    Comment  Pt reports that his diet is going well. No questions or concerns. Energy level is stable and strength is good.    Expected Outcome  ST: continue to include a variety of foods LT: feel better       Psychosocial: Target Goals: Acknowledge presence or absence of significant depression and/or stress, maximize coping skills, provide positive support system. Participant is able to verbalize types and ability to use techniques and skills needed for reducing stress and depression.   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 He is hoping being here will help him get stronger and increase his stamina      Family Dynamics   Good Support System?  Yes      Barriers   Psychosocial barriers to participate in program  There are no identifiable barriers or psychosocial  needs.;The patient should benefit from training in stress management and relaxation.      Screening Interventions   Interventions  Encouraged to exercise;Program counselor consult;To provide support and resources with identified psychosocial needs    Expected Outcomes  Short Term goal: Utilizing psychosocial counselor, staff and physician to assist with identification of specific Stressors or current issues interfering with healing process. Setting desired goal for each stressor or current issue identified.;Long Term Goal: Stressors or current issues  are controlled or eliminated.;Short Term goal: Identification and review with participant of any Quality of Life or Depression concerns found by scoring the questionnaire.;Long Term goal: The participant improves quality of Life and PHQ9 Scores as seen by post scores and/or verbalization of changes       Quality of Life Scores:  Quality of Life - 05/01/19 1022      Quality of Life   Select  Quality of Life      Quality of Life Scores   Health/Function Pre  23.53 %    Socioeconomic Pre  24.42 %    Psych/Spiritual Pre  27 %    Family Pre  27.1 %    GLOBAL Pre  24.97 %      Scores of 19 and below usually indicate a poorer quality of life in these areas.  A difference of  2-3 points is a clinically meaningful difference.  A difference of 2-3 points in the total score of the Quality of Life Index has been associated with significant improvement in overall quality of life, self-image, physical symptoms, and general health in studies assessing change in quality of life.  PHQ-9: Recent Review Flowsheet Data    Depression screen [facility] 2/9 05/01/2019   Decreased Interest 0   Down, Depressed, Hopeless 0   PHQ - 2 Score 0   Altered sleeping 0   Tired, decreased energy 1   Change in appetite 1   Feeling bad or failure about yourself  0   Trouble concentrating 0   Moving slowly or fidgety/restless 0   Suicidal thoughts 0   PHQ-9 Score 2   Difficult  doing work/chores Somewhat difficult     Interpretation of Total Score  Total Score Depression Severity:  1-4 = Minimal depression, 5-9 = Mild depression, 10-14 = Moderate depression, 15-19 = Moderately severe depression, 20-27 = Severe depression   Psychosocial Evaluation and Intervention: Psychosocial Evaluation - 04/17/19 1416      Psychosocial Evaluation & Interventions   Comments  Feels behind of doing things around the house and 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 He is hoping being here will help him get stronger and increase his stamina    Expected Outcomes  Short: attend [facility] on consistent basis to achieve goals. Long: develop self care habits    Continue Psychosocial Services   Follow up required by staff       Psychosocial Re-Evaluation: Psychosocial Re-Evaluation    Brian Mcclure Name 05/10/19 [phone] 06/12/19 0959 08/29/19 1138 09/29/19 1132       Psychosocial Re-Evaluation   Current issues with  Current Stress Concerns  Current Stress Concerns  None Identified  None Identified    Comments  Patient wants to get his health back on track. He wants to do things like remodel and downsize but feels like he does not have enough time left. He feels like he is not checking off things off his bucket list. Jenny Reichmann is retired and finding himself very busy and not enough time to do things around the house.  Patient stated that he has no new stress concerns and feels he is able to manage this aspect of his life. He stated that he feels his stamina is increasing and he can work on project at more of a steady pace now and take fewer breaks. He reports sleeping well at night.  Brian Mcclure is doing well mentally.  He is glad to be getting back  to a routine again.  He sleeps well and denies any major stressors.  Brian Mcclure continues to do well mentally.  He is enjoying coming to class and sleeping well.  He is not looking forward to pollen season.    Expected Outcomes  Short:  continue to exercise to reduce stress. Long: maintain exercise to keep stress at a minimum.  Short: continue to exercise to reduce stress. Long: maintain exercise to keep stress at a minimum.  Short: Continue to exercise Long: Continue to stay positive and get into routine.  Short: Continue to exercise Long: Continue to stay positive and get into routine.    Interventions  Encouraged to attend Cardiac Rehabilitation for the exercise  --  Encouraged to attend Cardiac Rehabilitation for the exercise  Encouraged to attend Cardiac Rehabilitation for the exercise    Continue Psychosocial Services   Follow up required by staff  Follow up required by staff  Follow up required by staff  --       Psychosocial Discharge (Final Psychosocial Re-Evaluation): Psychosocial Re-Evaluation - 09/29/19 1132      Psychosocial Re-Evaluation   Current issues with  None Identified    Comments  Brian Mcclure continues to do well mentally.  He is enjoying coming to class and sleeping well.  He is not looking forward to pollen season.    Expected Outcomes  Short: Continue to exercise Long: Continue to stay positive and get into routine.    Interventions  Encouraged to attend Cardiac Rehabilitation for the exercise       Vocational Rehabilitation: Provide vocational rehab assistance to qualifying candidates.   Vocational Rehab Evaluation & Intervention: Vocational Rehab - 04/17/19 1406      Initial Vocational Rehab Evaluation & Intervention   Assessment shows need for Vocational Rehabilitation  No       Education: Education Goals: Education classes will be provided on a variety of topics geared toward better understanding of heart health and risk factor modification. Participant will state understanding/return demonstration of topics presented as noted by education test scores.  Learning Barriers/Preferences: Learning Barriers/Preferences - 04/17/19 1406      Learning Barriers/Preferences   Learning Barriers  None     Learning Preferences  Individual Instruction       Education Topics:  AED/CPR: - Group verbal and written instruction with the use of models to demonstrate the basic use of the AED with the basic ABC's of resuscitation.   General Nutrition Guidelines/Fats and Fiber: -Group instruction provided by verbal, written material, models and posters to present the general guidelines for heart healthy nutrition. Gives an explanation and review of dietary fats and fiber.   Controlling Sodium/Reading Food Labels: -Group verbal and written material supporting the discussion of sodium use in heart healthy nutrition. Review and explanation with models, verbal and written materials for utilization of the food label.   Exercise Physiology & General Exercise Guidelines: - Group verbal and written instruction with models to review the exercise physiology of the cardiovascular system and associated critical values. Provides general exercise guidelines with specific guidelines to those with heart or lung disease.    Aerobic Exercise & Resistance Training: - Gives group verbal and written instruction on the various components of exercise. Focuses on aerobic and resistive training programs and the benefits of this training and how to safely progress through these programs..   Flexibility, Balance, Mind/Body Relaxation: Provides group verbal/written instruction on the benefits of flexibility and balance training, including mind/body exercise modes such as yoga, pilates  and tai chi.  Demonstration and skill practice provided.   Stress and Anxiety: - Provides group verbal and written instruction about the health risks of elevated stress and causes of high stress.  Discuss the correlation between heart/lung disease and anxiety and treatment options. Review healthy ways to manage with stress and anxiety.   Depression: - Provides group verbal and written instruction on the correlation between heart/lung  disease and depressed mood, treatment options, and the stigmas associated with seeking treatment.   Anatomy & Physiology of the Heart: - Group verbal and written instruction and models provide basic cardiac anatomy and physiology, with the coronary electrical and arterial systems. Review of Valvular disease and Heart Failure   Cardiac Procedures: - Group verbal and written instruction to review commonly prescribed medications for heart disease. Reviews the medication, class of the drug, and side effects. Includes the steps to properly store meds and maintain the prescription regimen. (beta blockers and nitrates)   Cardiac Medications I: - Group verbal and written instruction to review commonly prescribed medications for heart disease. Reviews the medication, class of the drug, and side effects. Includes the steps to properly store meds and maintain the prescription regimen.   Cardiac Medications II: -Group verbal and written instruction to review commonly prescribed medications for heart disease. Reviews the medication, class of the drug, and side effects. (all other drug classes)    Go Sex-Intimacy & Heart Disease, Get SMART - Goal Setting: - Group verbal and written instruction through game format to discuss heart disease and the return to sexual intimacy. Provides group verbal and written material to discuss and apply goal setting through the application of the S.M.A.R.T. Method.   Other Matters of the Heart: - Provides group verbal, written materials and models to describe Stable Angina and Peripheral Artery. Includes description of the disease process and treatment options available to the cardiac patient.   Exercise & Equipment Safety: - Individual verbal instruction and demonstration of equipment use and safety with use of the equipment.   Infection Prevention: - Provides verbal and written material to individual with discussion of infection control including proper hand  washing and proper equipment cleaning during exercise session.   Falls Prevention: - Provides verbal and written material to individual with discussion of falls prevention and safety.   Diabetes: - Individual verbal and written instruction to review signs/symptoms of diabetes, desired ranges of glucose level fasting, after meals and with exercise. Acknowledge that pre and post exercise glucose checks will be done for 3 sessions at entry of program.   Know Your Numbers and Risk Factors: -Group verbal and written instruction about important numbers in your health.  Discussion of what are risk factors and how they play a role in the disease process.  Review of Cholesterol, Blood Pressure, Diabetes, and BMI and the role they play in your overall health.   Sleep Hygiene: -Provides group verbal and written instruction about how sleep can affect your health.  Define sleep hygiene, discuss sleep cycles and impact of sleep habits. Review good sleep hygiene tips.    Other: -Provides group and verbal instruction on various topics (see comments)   Knowledge Questionnaire Score: Knowledge Questionnaire Score - 05/01/19 1023      Knowledge Questionnaire Score   Pre Score  21/26 education focus: A&P, PAD, Nutrition, Exercise       Core Components/Risk Factors/Patient Goals at Admission: Personal Goals and Risk Factors at Admission - 05/01/19 1024      Core Components/Risk Factors/Patient Goals  on Admission    Weight Management  Yes;Weight Maintenance    Intervention  Weight Management: Develop a combined nutrition and exercise program designed to reach desired caloric intake, while maintaining appropriate intake of nutrient and fiber, sodium and fats, and appropriate energy expenditure required for the weight goal.;Weight Management: Provide education and appropriate resources to help participant work on and attain dietary goals.    Admit Weight  183 lb 3.2 oz (83.1 kg)    Goal Weight: Short  Term  183 lb (83 kg)    Goal Weight: Long Term  183 lb (83 kg)    Expected Outcomes  Short Term: Continue to assess and modify interventions until short term weight is achieved;Long Term: Adherence to nutrition and physical activity/exercise program aimed toward attainment of established weight goal;Weight Maintenance: Understanding of the daily nutrition guidelines, which includes 25-35% calories from fat, 7% or less cal from saturated fats, less than '200mg'$  cholesterol, less than 1.5gm of sodium, & 5 or more servings of fruits and vegetables daily    Number of packs per day  1 pack/ day    Intervention  Assist the participant in steps to quit. Provide individualized education and counseling about committing to Tobacco Cessation, relapse prevention, and pharmacological support that can be provided by physician.;Advice worker, assist with locating and accessing local/national Quit Smoking programs, and support quit date choice.    Expected Outcomes  Short Term: Will demonstrate readiness to quit, by selecting a quit date.;Short Term: Will quit all tobacco product use, adhering to prevention of relapse plan.;Long Term: Complete abstinence from all tobacco products for at least 12 months from quit date.    Lipids  Yes    Intervention  Provide education and support for participant on nutrition & aerobic/resistive exercise along with prescribed medications to achieve LDL '70mg'$ , HDL >'40mg'$ .    Expected Outcomes  Short Term: Participant states understanding of desired cholesterol values and is compliant with medications prescribed. Participant is following exercise prescription and nutrition guidelines.;Long Term: Cholesterol controlled with medications as prescribed, with individualized exercise RX and with personalized nutrition plan. Value goals: LDL < '70mg'$ , HDL > 40 mg.       Core Components/Risk Factors/Patient Goals Review:  Goals and Risk Factor Review    Row Name 05/10/19 0858 06/12/19  0943 08/29/19 1141 09/29/19 1132       Core Components/Risk Factors/Patient Goals Review   Personal Goals Review  Weight Management/Obesity;Improve shortness of breath with ADL's;Tobacco Cessation;Lipids  Weight Management/Obesity;Tobacco Cessation;Lipids;Improve shortness of breath with ADL's  Weight Management/Obesity;Tobacco Cessation;Lipids;Improve shortness of breath with ADL's  Weight Management/Obesity;Tobacco Cessation;Lipids;Improve shortness of breath with ADL's    Review  Brian Mcclure smokes a pack a day and does not have any interest in quitting. He is managing his weight and staying around 183 pounds. His lipids are doing well and he is taking medication for it. He has labs done recently and he states it was below 48.  Brian Mcclure continues to maintain his weight, and take all medications as perscribed to control lipid levels. He continues to smoke a pack and day and is not interested in quitting. Patient stated that shortness of breath does not limit him so much, but more leg fatigue. He does feel that his stamina is increasing and he does not have to take as many rest breaks while doing physical work as he used to. He is now able to work at a steady pace.  Brian Mcclure is doing well in rehab. His weight is staying  steady.  His blood pressures continue do well.  He still has some SOB, but it has improved some since he has started.  He is still smoking 1 ppd and not ready to quit currently.  Brian Mcclure is doing well in rehab. His weight is up some, he is hoping its muscle.  He is running some on treadmill, but SOB still seems to be his limit.    He is currently on a statin vacation and his legs are feeling better.  He is hoping that they will either try something different or lower his dose once back on again.   Still at 1ppd and not ready to quit.    Expected Outcomes  Short: continue to exercise to improve ADL'S. Long: maintain ADL independently.  Short: continue to exercise to improve ADL'S. Long: maintain ADL independently.   Short: Continue to exercise to help with SOB.  Long: Continue to maintain exercise.  Short: Continue to exercise to help with SOB.  Long: Continue to maintain exercise.       Core Components/Risk Factors/Patient Goals at Discharge (Final Review):  Goals and Risk Factor Review - 09/29/19 1132      Core Components/Risk Factors/Patient Goals Review   Personal Goals Review  Weight Management/Obesity;Tobacco Cessation;Lipids;Improve shortness of breath with ADL's    Review  Brian Mcclure is doing well in rehab. His weight is up some, he is hoping its muscle.  He is running some on treadmill, but SOB still seems to be his limit.    He is currently on a statin vacation and his legs are feeling better.  He is hoping that they will either try something different or lower his dose once back on again.   Still at 1ppd and not ready to quit.    Expected Outcomes  Short: Continue to exercise to help with SOB.  Long: Continue to maintain exercise.       ITP Comments: ITP Comments    Row Name 04/17/19 1401 04/18/19 1211 04/18/19 1311 04/26/19 1112 05/01/19 0947   ITP Comments  Virtual Initial Oriention completed. Diagnosis can be found in [facility] 8/23. EP/RD orientation scheduled fro 9/29 at 11 am  completed RD initial eval  When patient arrived today patient reported to Nada Maclachlan, EP, that he had not taken his Plavix in 3 days due to being out of the medication.  The reason reported was he had not yet received his medications in the mail from the [place].  Patient reported this is the only medication he has missed.  Patient no longer taking baby aspirin as he was advised to stop Aspirin after one month.  He is also on Coumadin due to afib / flutter.  Sent secure chat message to Ronda Fairly, RN and Ignacia Bayley, NP at [facility] to request Plavix prescription be called in to [facility] in [place].  Patient has [employer] in addition to the [place]. Verlon Au, RN responded on [employer] letting me know she  had called the prescription in.  Patient instructed to go pick up Plavix prescription and take medication today.  Note:  Patient did not exercise today.     Patient will return on Monday, April 24, 2019 to start Cardiac Rehab.  30 day review completed. ITP sent to [name], Medical Director of Cardiac and Pulmonary Rehab. Continue with ITP unless changes are made by physician.  Department closed starting 10/2 until further notice by infection prevention and Health at Work teams for [place].  First full day of exercise!  Patient was oriented to gym and equipment including functions, settings, policies, and procedures.  Patient's individual exercise prescription and treatment plan were reviewed.  All starting workloads were established based on the results of the 6 minute walk test done at initial orientation visit.  The plan for exercise progression was also introduced and progression will be customized based on patient's performance and goals.   Row Name 05/24/19 0637 05/31/19 1225 07/11/19 1447 07/19/19 0852 07/28/19 1049   ITP Comments  30 day review completed. Continue with ITP sent to [name], Medical Director of Cardiac and Pulmonary Rehab for review , changes as needed and signature.  Brian Mcclure is out of town for vacation for two weeks.  Brian Mcclure has not attended since 06/28/2019.  He plans to return after New Year.  30 day review competed . ITP sent to Dr Emily Filbert for review, changes as needed and ITP approval signature  Brian Mcclure has been out since 06/28/19 and plans to return on Monday 07/31/19.   [place] Name 08/15/19 1246 08/16/19 1229 09/13/19 0638 09/22/19 1119 10/11/19 0637   ITP Comments  Brian Mcclure called to inform staff that he has been around a COVID positive person. He cancelled his appointment for Monday and will keep [name] updated.  30 day review completed. ITP sent to [name], Medical Director of Cardiac and Pulmonary Rehab. Continue with ITP unless changes are made by physician.   Department operating under reduced schedule until further notice by request from hospital leadership.  30 day chart review completed. ITP sent to Dr Zachery Dakins Medical Director, for review,changes as needed and signature.  Brian Mcclure is starting a 30 day Holter monitor this week  30 day chart review completed. ITP sent to Dr Zachery Dakins Medical Director, for review,changes as needed and signature. Continue with ITP if no changes requested      Comments:

## 2019-10-11 NOTE — Progress Notes (Signed)
Daily Session Note  Patient Details  Name: Brian Mcclure MRN: [id number] Date of Birth: Dec 11, 1955 Referring Provider:     Cardiac Rehab from 05/01/2019 in [facility] Cardiac and Pulmonary Rehab  Referring Provider  Lynda Rainwater, MD      Encounter Date: 10/11/2019  Check In: Session Check In - 10/11/19 1130      Check-In   Supervising physician immediately available to respond to emergencies  See telemetry face sheet for immediately available ER MD    Location  [facility]-Cardiac & Pulmonary Rehab    Staff Present  Renita Papa, RN BSN;Joseph Hood RCP,RRT,BSRT;Melissa [place] RDN, Rowe Pavy, [place], ACSM CEP, Exercise Physiologist    Virtual Visit  No    Medication changes reported      No    Fall or balance concerns reported     No    Warm-up and Cool-down  Performed on first and last piece of equipment    Resistance Training Performed  Yes    VAD Patient?  No    PAD/SET Patient?  No      Pain Assessment   Currently in Pain?  No/denies          Social History   Tobacco Use  Smoking Status Current Every Day Smoker  . Packs/day: 1.00  . Years: 40.00  . Pack years: 40.00  . Types: Cigarettes  Smokeless Tobacco Never Used    Goals Met:  Independence with exercise equipment Exercise tolerated well No report of cardiac concerns or symptoms Strength training completed today  Goals Unmet:  Not Applicable  Comments: Pt able to follow exercise prescription today without complaint.  Will continue to monitor for progression.    [name] is Medical Director for [place] and [name].

## 2019-10-13 ENCOUNTER — Other Ambulatory Visit: Payer: Self-pay

## 2019-10-13 ENCOUNTER — Encounter: Payer: No Typology Code available for payment source | Admitting: *Deleted

## 2019-10-13 DIAGNOSIS — I213 ST elevation (STEMI) myocardial infarction of unspecified site: Secondary | ICD-10-CM

## 2019-10-13 NOTE — Progress Notes (Signed)
Daily Session Note  Patient Details  Name: Brian Mcclure MRN: [id number] Date of Birth: [birth year] Referring Provider:     Cardiac Rehab from 05/01/2019 in [facility] Cardiac and Pulmonary Rehab  Referring Provider  Lynda Rainwater, MD      Encounter Date: 10/13/2019  Check In: Session Check In - 10/13/19 1114      Check-In   Supervising physician immediately available to respond to emergencies  See telemetry face sheet for immediately available ER MD    Location  [facility]-Cardiac & Pulmonary Rehab    Staff Present  Renita Papa, RN BSN;Joseph [address] [place], [place], [place], CCRP, CCET    Virtual Visit  No    Medication changes reported      No    Fall or balance concerns reported     No    Warm-up and Cool-down  Performed on first and last piece of equipment    Resistance Training Performed  Yes    VAD Patient?  No    PAD/SET Patient?  No      Pain Assessment   Currently in Pain?  No/denies          Social History   Tobacco Use  Smoking Status Current Every Day Smoker  . Packs/day: 1.00  . Years: 40.00  . Pack years: 40.00  . Types: Cigarettes  Smokeless Tobacco Never Used    Goals Met:  Independence with exercise equipment Exercise tolerated well No report of cardiac concerns or symptoms Strength training completed today  Goals Unmet:  Not Applicable  Comments: Pt able to follow exercise prescription today without complaint.  Will continue to monitor for progression.    [name] is Medical Director for [place] and [name].

## 2019-10-16 ENCOUNTER — Other Ambulatory Visit: Payer: Self-pay

## 2019-10-16 ENCOUNTER — Encounter: Payer: No Typology Code available for payment source | Admitting: *Deleted

## 2019-10-16 DIAGNOSIS — I213 ST elevation (STEMI) myocardial infarction of unspecified site: Secondary | ICD-10-CM

## 2019-10-16 NOTE — Progress Notes (Signed)
Daily Session Note  Patient Details  Name: Brian Mcclure MRN: [id number] Date of Birth: 1955/11/22 Referring Provider:     Cardiac Rehab from 05/01/2019 in [facility] Cardiac and Pulmonary Rehab  Referring Provider  Lynda Rainwater, MD      Encounter Date: 10/16/2019  Check In: Session Check In - 10/16/19 1122      Check-In   Supervising physician immediately available to respond to emergencies  See telemetry face sheet for immediately available ER MD    Location  [facility]-Cardiac & Pulmonary Rehab    Staff Present  Renita Papa, RN Moises Blood, BS, ACSM CEP, Exercise Physiologist;Joseph Tessie Fass RCP,RRT,BSRT    Virtual Visit  No    Medication changes reported      No    Fall or balance concerns reported     No    Warm-up and Cool-down  Performed on first and last piece of equipment    Resistance Training Performed  Yes    VAD Patient?  No    PAD/SET Patient?  No      Pain Assessment   Currently in Pain?  No/denies          Social History   Tobacco Use  Smoking Status Current Every Day Smoker  . Packs/day: 1.00  . Years: 40.00  . Pack years: 40.00  . Types: Cigarettes  Smokeless Tobacco Never Used    Goals Met:  Independence with exercise equipment Exercise tolerated well No report of cardiac concerns or symptoms Strength training completed today  Goals Unmet:  Not Applicable  Comments: Pt able to follow exercise prescription today without complaint.  Will continue to monitor for progression.    [name] is Medical Director for [place] and [name].

## 2019-10-18 ENCOUNTER — Other Ambulatory Visit: Payer: Self-pay

## 2019-10-18 ENCOUNTER — Encounter: Payer: No Typology Code available for payment source | Admitting: *Deleted

## 2019-10-18 ENCOUNTER — Ambulatory Visit (INDEPENDENT_AMBULATORY_CARE_PROVIDER_SITE_OTHER): Payer: No Typology Code available for payment source

## 2019-10-18 DIAGNOSIS — Z5181 Encounter for therapeutic drug level monitoring: Secondary | ICD-10-CM

## 2019-10-18 DIAGNOSIS — I480 Paroxysmal atrial fibrillation: Secondary | ICD-10-CM

## 2019-10-18 DIAGNOSIS — I236 Thrombosis of atrium, auricular appendage, and ventricle as current complications following acute myocardial infarction: Secondary | ICD-10-CM | POA: Diagnosis not present

## 2019-10-18 DIAGNOSIS — I213 ST elevation (STEMI) myocardial infarction of unspecified site: Secondary | ICD-10-CM | POA: Diagnosis not present

## 2019-10-18 LAB — POCT INR: INR: 1.7 — AB (ref 2.0–3.0)

## 2019-10-18 NOTE — Progress Notes (Signed)
Daily Session Note  Patient Details  Name: Brian Mcclure MRN: [id number] Date of Birth: 1955-09-18 Referring Provider:     Cardiac Rehab from 05/01/2019 in [facility] Cardiac and Pulmonary Rehab  Referring Provider  Lynda Rainwater, MD      Encounter Date: 10/18/2019  Check In: Session Check In - 10/18/19 1133      Check-In   Supervising physician immediately available to respond to emergencies  See telemetry face sheet for immediately available ER MD    Location  [facility]-Cardiac & Pulmonary Rehab    Staff Present  Renita Papa, RN BSN;Joseph Hood RCP,RRT,BSRT;Melissa [place] RDN, LDN    Virtual Visit  No    Medication changes reported      No    Fall or balance concerns reported     No    Warm-up and Cool-down  Performed on first and last piece of equipment    Resistance Training Performed  Yes    VAD Patient?  No    PAD/SET Patient?  No      Pain Assessment   Currently in Pain?  No/denies          Social History   Tobacco Use  Smoking Status Current Every Day Smoker  . Packs/day: 1.00  . Years: 40.00  . Pack years: 40.00  . Types: Cigarettes  Smokeless Tobacco Never Used    Goals Met:  Independence with exercise equipment Exercise tolerated well No report of cardiac concerns or symptoms Strength training completed today  Goals Unmet:  Not Applicable  Comments: Pt able to follow exercise prescription today without complaint.  Will continue to monitor for progression.    [name] is Medical Director for [place] and [name].

## 2019-10-18 NOTE — Patient Instructions (Signed)
-   take 1 whole tablet today - tomorrow START NEW DOSAGE of warfarin 2.5 mg every day EXCEPT 1 whole tablet on [place]. - recheck in 3 weeks

## 2019-10-20 ENCOUNTER — Encounter: Payer: No Typology Code available for payment source | Attending: Internal Medicine | Admitting: *Deleted

## 2019-10-20 ENCOUNTER — Other Ambulatory Visit: Payer: Self-pay

## 2019-10-20 DIAGNOSIS — I213 ST elevation (STEMI) myocardial infarction of unspecified site: Secondary | ICD-10-CM | POA: Diagnosis not present

## 2019-10-20 NOTE — Progress Notes (Signed)
Daily Session Note  Patient Details  Name: Brian Mcclure MRN: [id number] Date of Birth: 11-11-1955 Referring Provider:     Cardiac Rehab from 05/01/2019 in [facility] Cardiac and Pulmonary Rehab  Referring Provider  Lynda Rainwater, MD      Encounter Date: 10/20/2019  Check In: Session Check In - 10/20/19 1259      Check-In   Supervising physician immediately available to respond to emergencies  See telemetry face sheet for immediately available ER MD    Location  [facility]-Cardiac & Pulmonary Rehab    Staff Present  Renita Papa, RN BSN;Joseph Flavia Shipper;Heath Lark, RN, BSN, CCRP    Virtual Visit  No    Medication changes reported      No    Fall or balance concerns reported     No    Warm-up and Cool-down  Performed on first and last piece of equipment    Resistance Training Performed  Yes    VAD Patient?  No    PAD/SET Patient?  No      Pain Assessment   Currently in Pain?  No/denies          Social History   Tobacco Use  Smoking Status Current Every Day Smoker  . Packs/day: 1.00  . Years: 40.00  . Pack years: 40.00  . Types: Cigarettes  Smokeless Tobacco Never Used    Goals Met:  Independence with exercise equipment Exercise tolerated well No report of cardiac concerns or symptoms Strength training completed today  Goals Unmet:  Not Applicable  Comments: Pt able to follow exercise prescription today without complaint.  Will continue to monitor for progression.    [name] is Medical Director for [place] and [name].

## 2019-10-23 ENCOUNTER — Encounter: Payer: No Typology Code available for payment source | Admitting: *Deleted

## 2019-10-23 ENCOUNTER — Other Ambulatory Visit: Payer: Self-pay

## 2019-10-23 DIAGNOSIS — I213 ST elevation (STEMI) myocardial infarction of unspecified site: Secondary | ICD-10-CM

## 2019-10-23 NOTE — Progress Notes (Signed)
Daily Session Note  Patient Details  Name: Brian Mcclure MRN: [id number] Date of Birth: 06-Jan-1956 Referring Provider:     Cardiac Rehab from 05/01/2019 in [facility] Cardiac and Pulmonary Rehab  Referring Provider  Lynda Rainwater, MD      Encounter Date: 10/23/2019  Check In: Session Check In - 10/23/19 1114      Check-In   Supervising physician immediately available to respond to emergencies  See telemetry face sheet for immediately available ER MD    Location  [facility]-Cardiac & Pulmonary Rehab    Staff Present  Renita Papa, RN BSN;Joseph [address] [place], [place], ACSM CEP, Exercise Physiologist    Virtual Visit  No    Medication changes reported      No    Fall or balance concerns reported     No    Warm-up and Cool-down  Performed on first and last piece of equipment    Resistance Training Performed  Yes    VAD Patient?  No    PAD/SET Patient?  No      Pain Assessment   Currently in Pain?  No/denies          Social History   Tobacco Use  Smoking Status Current Every Day Smoker  . Packs/day: 1.00  . Years: 40.00  . Pack years: 40.00  . Types: Cigarettes  Smokeless Tobacco Never Used    Goals Met:  Independence with exercise equipment Exercise tolerated well No report of cardiac concerns or symptoms Strength training completed today  Goals Unmet:  Not Applicable  Comments: Pt able to follow exercise prescription today without complaint.  Will continue to monitor for progression.    [name] is Medical Director for [place] and [name].

## 2019-10-24 NOTE — Patient Instructions (Signed)
Discharge Patient Instructions  Patient Details  Name: Brian Mcclure MRN: [id number] Date of Birth: [birth year] Referring Provider:  Holli Humbles, MD   Number of Visits: 53  Reason for Discharge:  Patient reached a stable level of exercise. Patient independent in their exercise. Patient has met program and personal goals.  Smoking History:  Social History   Tobacco Use  Smoking Status Current Every Day Smoker  . Packs/day: 1.00  . Years: 40.00  . Pack years: 40.00  . Types: Cigarettes  Smokeless Tobacco Never Used    Diagnosis:  ST elevation myocardial infarction (STEMI), unspecified artery ([place])  Initial Exercise Prescription: Initial Exercise Prescription - 05/01/19 1000      Date of Initial Exercise RX and Referring Provider   Date  05/01/19    Referring Provider  Lynda Rainwater, MD      Treadmill   MPH  3    Grade  1    Minutes  15    METs  3.71      Recumbant Bike   Level  3    RPM  50    Watts  63    Minutes  15    METs  3      NuStep   Level  4    SPM  80    Minutes  15    METs  3      Prescription Details   Frequency (times per week)  2    Duration  Progress to 30 minutes of continuous aerobic without signs/symptoms of physical distress      Intensity   THRR 40-80% of Max Heartrate  94-136    Ratings of Perceived Exertion  11-13    Perceived Dyspnea  0-4      Progression   Progression  Continue to progress workloads to maintain intensity without signs/symptoms of physical distress.      Resistance Training   Training Prescription  Yes    Weight  4 lbs    Reps  10-15       Discharge Exercise Prescription (Final Exercise Prescription Changes): Exercise Prescription Changes - 10/17/19 1600      Response to Exercise   Blood Pressure (Admit)  122/58    Blood Pressure (Exercise)  148/60    Blood Pressure (Exit)  110/70    Heart Rate (Admit)  57 bpm    Heart Rate (Exercise)  126 bpm    Heart Rate (Exit)  72 bpm    Rating of  Perceived Exertion (Exercise)  13    Symptoms  none    Duration  Continue with 30 min of aerobic exercise without signs/symptoms of physical distress.    Intensity  THRR unchanged      Progression   Progression  Continue to progress workloads to maintain intensity without signs/symptoms of physical distress.    Average METs  7.1      Resistance Training   Training Prescription  Yes    Weight   6 lb    Reps  10-15      Interval Training   Interval Training  No      Treadmill   MPH  3.2    Grade  8    Minutes  15    METs  7      Recumbant Bike   Level  6    Minutes  15    METs  7.2      NuStep   Level  8  Minutes  15    METs  7.1      Home Exercise Plan   Plans to continue exercise at  Home (comment)   walking   Frequency  Add 2 additional days to program exercise sessions.    Initial Home Exercises Provided  05/08/19       Functional Capacity: 6 Minute Walk    Row Name 05/01/19 1017 10/13/19 1141       6 Minute Walk   Phase  Initial  Discharge    Distance  1765 feet  2040 feet    Distance % Change  --  15.5 %    Distance Feet Change  --  275 ft    Walk Time  6 minutes  6 minutes    # of Rest Breaks  0  0    MPH  3.34  3.86    METS  4.37  5.7    RPE  10  12    Perceived Dyspnea   1  2    VO2 Peak  15.3  19.97    Symptoms  Yes (comment)  Yes (comment)    Comments  claudication pain 3/5, SOB  caludication pain    Resting HR  52 bpm  55 bpm    Resting BP  118/64  134/68    Resting Oxygen Saturation   99 %  --    Exercise Oxygen Saturation  during 6 min walk  100 %  --    Max Ex. HR  92 bpm  154 bpm    Max Ex. BP  132/74  158/64    2 Minute Post BP  124/62  --       Quality of Life: Quality of Life - 10/23/19 1435      Quality of Life Scores   Health/Function Post  23.17 %    Socioeconomic Post  21 %    Psych/Spiritual Post  24.64 %    Family Post  25.9 %    GLOBAL Post  23.5 %       Personal Goals: Goals established at orientation with  interventions provided to work toward goal. Personal Goals and Risk Factors at Admission - 05/01/19 1024      Core Components/Risk Factors/Patient Goals on Admission    Weight Management  Yes;Weight Maintenance    Intervention  Weight Management: Develop a combined nutrition and exercise program designed to reach desired caloric intake, while maintaining appropriate intake of nutrient and fiber, sodium and fats, and appropriate energy expenditure required for the weight goal.;Weight Management: Provide education and appropriate resources to help participant work on and attain dietary goals.    Admit Weight  183 lb 3.2 oz (83.1 kg)    Goal Weight: Short Term  183 lb (83 kg)    Goal Weight: Long Term  183 lb (83 kg)    Expected Outcomes  Short Term: Continue to assess and modify interventions until short term weight is achieved;Long Term: Adherence to nutrition and physical activity/exercise program aimed toward attainment of established weight goal;Weight Maintenance: Understanding of the daily nutrition guidelines, which includes 25-35% calories from fat, 7% or less cal from saturated fats, less than '200mg'$  cholesterol, less than 1.5gm of sodium, & 5 or more servings of fruits and vegetables daily    Number of packs per day  1 pack/ day    Intervention  Assist the participant in steps to quit. Provide individualized education and counseling about committing to Tobacco Cessation, relapse prevention,  and pharmacological support that can be provided by physician.;Advice worker, assist with locating and accessing local/national Quit Smoking programs, and support quit date choice.    Expected Outcomes  Short Term: Will demonstrate readiness to quit, by selecting a quit date.;Short Term: Will quit all tobacco product use, adhering to prevention of relapse plan.;Long Term: Complete abstinence from all tobacco products for at least 12 months from quit date.    Lipids  Yes    Intervention   Provide education and support for participant on nutrition & aerobic/resistive exercise along with prescribed medications to achieve LDL '70mg'$ , HDL >'40mg'$ .    Expected Outcomes  Short Term: Participant states understanding of desired cholesterol values and is compliant with medications prescribed. Participant is following exercise prescription and nutrition guidelines.;Long Term: Cholesterol controlled with medications as prescribed, with individualized exercise RX and with personalized nutrition plan. Value goals: LDL < '70mg'$ , HDL > 40 mg.        Personal Goals Discharge: Goals and Risk Factor Review - 09/29/19 1132      Core Components/Risk Factors/Patient Goals Review   Personal Goals Review  Weight Management/Obesity;Tobacco Cessation;Lipids;Improve shortness of breath with ADL's    Review  Wille Glaser is doing well in rehab. His weight is up some, he is hoping its muscle.  He is running some on treadmill, but SOB still seems to be his limit.    He is currently on a statin vacation and his legs are feeling better.  He is hoping that they will either try something different or lower his dose once back on again.   Still at 1ppd and not ready to quit.    Expected Outcomes  Short: Continue to exercise to help with SOB.  Long: Continue to maintain exercise.       Exercise Goals and Review: Exercise Goals    Row Name 05/01/19 1021             Exercise Goals   Increase Physical Activity  Yes       Intervention  Provide advice, education, support and counseling about physical activity/exercise needs.;Develop an individualized exercise prescription for aerobic and resistive training based on initial evaluation findings, risk stratification, comorbidities and participant's personal goals.       Expected Outcomes  Short Term: Attend rehab on a regular basis to increase amount of physical activity.;Long Term: Add in home exercise to make exercise part of routine and to increase amount of physical  activity.;Long Term: Exercising regularly at least 3-5 days a week.       Increase Strength and Stamina  Yes       Intervention  Provide advice, education, support and counseling about physical activity/exercise needs.;Develop an individualized exercise prescription for aerobic and resistive training based on initial evaluation findings, risk stratification, comorbidities and participant's personal goals.       Expected Outcomes  Short Term: Increase workloads from initial exercise prescription for resistance, speed, and METs.;Short Term: Perform resistance training exercises routinely during rehab and add in resistance training at home;Long Term: Improve cardiorespiratory fitness, muscular endurance and strength as measured by increased METs and functional capacity (6MWT)       Able to understand and use rate of perceived exertion (RPE) scale  Yes       Intervention  Provide education and explanation on how to use RPE scale       Expected Outcomes  Short Term: Able to use RPE daily in rehab to express subjective intensity level;Long Term:  Able to  use RPE to guide intensity level when exercising independently       Able to understand and use Dyspnea scale  Yes       Intervention  Provide education and explanation on how to use Dyspnea scale       Expected Outcomes  Short Term: Able to use Dyspnea scale daily in rehab to express subjective sense of shortness of breath during exertion;Long Term: Able to use Dyspnea scale to guide intensity level when exercising independently       Knowledge and understanding of Target Heart Rate Range (THRR)  Yes       Intervention  Provide education and explanation of THRR including how the numbers were predicted and where they are located for reference       Expected Outcomes  Short Term: Able to state/look up THRR;Short Term: Able to use daily as guideline for intensity in rehab;Long Term: Able to use THRR to govern intensity when exercising independently       Able  to check pulse independently  Yes       Intervention  Provide education and demonstration on how to check pulse in carotid and radial arteries.;Review the importance of being able to check your own pulse for safety during independent exercise       Expected Outcomes  Short Term: Able to explain why pulse checking is important during independent exercise;Long Term: Able to check pulse independently and accurately       Understanding of Exercise Prescription  Yes       Intervention  Provide education, explanation, and written materials on patient's individual exercise prescription       Expected Outcomes  Short Term: Able to explain program exercise prescription;Long Term: Able to explain home exercise prescription to exercise independently       Improve claudication pain toleration; Improve walking ability  Yes       Intervention  Participate in PAD/SET Rehab 2-3 days a week, walking at home as part of exercise prescription;Attend education sessions to aid in risk factor modification and understanding of disease process       Expected Outcomes  Short Term: Improve walking distance/time to onset of claudication pain;Long Term: Improve walking ability and toleration to claudication          Exercise Goals Re-Evaluation: Exercise Goals Re-Evaluation    Row Name 05/01/19 1026 05/08/19 1022 05/10/19 0847 05/18/19 1413 05/31/19 1225     Exercise Goal Re-Evaluation   Exercise Goals Review  Increase Physical Activity;Increase Strength and Stamina;Understanding of Exercise Prescription  Increase Physical Activity;Increase Strength and Stamina;Understanding of Exercise Prescription;Able to understand and use rate of perceived exertion (RPE) scale;Knowledge and understanding of Target Heart Rate Range (THRR);Able to check pulse independently  Understanding of Exercise Prescription;Increase Strength and Stamina;Increase Physical Activity  Increase Physical Activity;Increase Strength and Stamina;Able to  understand and use rate of perceived exertion (RPE) scale;Knowledge and understanding of Target Heart Rate Range (THRR);Able to check pulse independently;Understanding of Exercise Prescription  Increase Physical Activity;Increase Strength and Stamina;Understanding of Exercise Prescription   Comments  Reviewed RPE scale, THR and program prescription with pt today.  Pt voiced understanding and was given a copy of goals to take home.  Reviewed home exercise with pt today.  Pt plans to walk and do yard work for exercise.  Reviewed THR, pulse, RPE, sign and symptoms, NTG use, and when to call 911 or MD.  Also discussed weather considerations and indoor options.  Pt voiced understanding.  Patient states that  he will walk when he is done with rehab. Patient has a pulse ox to chech is heart rate and oxygen. John will have home exercise when he has had a little more time to get use to rehab. Explained to patient why it is important to continue exercising when he is done with the program. He wants to be able to jog more.  Jon tolerates exercise well and worked at around 4 METS.  Staff will work with him on getting back to jogging  Wille Glaser has been doing well in rehab.  He is up to level 7 on the NuStep.  We will continue to monitor his progress.   Expected Outcomes  Short: Use RPE daily to regulate intensity. Long: Follow program prescription in THR.  Short: add 1-2 days of walking at home following home exercise guidlines. Long: Become indepenedent with exercise program.  Short: increase strength and stamina. Long: be able to jog.  Short - attend consistently Long - incorporate joggging into program  Short: Walk on vacation.  Long: Continue to improve stamina.   [place] Name 06/12/19 0941 06/13/19 1437 06/28/19 1521 07/28/19 1050 08/09/19 1518     Exercise Goal Re-Evaluation   Exercise Goals Review  Increase Physical Activity;Increase Strength and Stamina;Understanding of Exercise Prescription  Increase Physical  Activity;Increase Strength and Stamina;Able to understand and use rate of perceived exertion (RPE) scale;Able to understand and use Dyspnea scale;Understanding of Exercise Prescription  Increase Physical Activity;Increase Strength and Stamina;Understanding of Exercise Prescription  --  Increase Physical Activity;Increase Strength and Stamina;Able to understand and use rate of perceived exertion (RPE) scale;Able to understand and use Dyspnea scale;Knowledge and understanding of Target Heart Rate Range (THRR);Able to check pulse independently;Understanding of Exercise Prescription   Comments  Wille Glaser continues to attend cardiac rehab regularly. He reports doing some form of exercise on all days he is not in class. He has made steady increases in his workloads in class and reports he feels he has had an increase in stamina and is able to do more at home without taking as many breaks.  John was out for two weeks on vacation.  He has been able to maintain previous levels on machines  Wille Glaser is doing well in rehab.  He was able to return to his previous workloads and should now be ready to start to move up some more or talk about intervals.  We will continue to monitor his progress.  Out since last review.  Wille Glaser missed some time due to the holidays but has maintained levels on machines.   Expected Outcomes  Short: continue to attend cariac rehab classes regularly. Long: Continue to impove stamina and become independent with exercise program.  Short - continueto attend consistently Long - increase MET level  Short: Talk about intervals.  Long: Continue to improve stamina.  --  Short - try intervals Long:  exercise consistently when not at program sessions   Row Name 08/23/19 1219 08/29/19 1136 09/06/19 0932 09/20/19 1426 09/29/19 1131     Exercise Goal Re-Evaluation   Exercise Goals Review  --  Increase Physical Activity;Increase Strength and Stamina;Understanding of Exercise Prescription  --  Increase Physical  Activity;Increase Strength and Stamina;Understanding of Exercise Prescription  Increase Physical Activity;Increase Strength and Stamina;Understanding of Exercise Prescription   Comments  Out since last review due to COVID exposure.  Wille Glaser is doing well in rehab. Marland KitchenHe is glad to be getting back to regular class.  He has not been as consistent at home, but wants to  get back to it.  He walks on his own and some at work too.  He feels that his strength and stamina are improving.  Wille Glaser continues to make steady progress in rehab.  He varies TM speed and grade for variety.  He works at correct HR and RPE range  Wille Glaser has been doing well in rehab.  He is up to level 6 on the NuStep.  We will continue to monitor his progress.  Wille Glaser is doing well in rehab.  He is feeling better and stronger.  He stays busy, but no consistent exericse at home.  His job keeps him active.   Expected Outcomes  --  Short: Continue to add in more exercise at home. Long: Continue to improve strength.  Short - exercise consistently at home Long:  increase overall MET level  Short: Continue to improve on treadmill.  Long: Continue to improve stamina.  Short: Continue to try to add in exercise at home.  Long: Continue to improve stamin.   [place] Name 10/05/19 1306 10/17/19 1607           Exercise Goal Re-Evaluation   Exercise Goals Review  Increase Physical Activity;Increase Strength and Stamina;Able to understand and use rate of perceived exertion (RPE) scale;Able to understand and use Dyspnea scale;Knowledge and understanding of Target Heart Rate Range (THRR);Able to check pulse independently;Understanding of Exercise Prescription  Increase Physical Activity;Increase Strength and Stamina;Understanding of Exercise Prescription      Comments  Wille Glaser is up to level 8 on NS and 6 lb strength training.  Staff will review the importance of exercise for cardiovascular health since he i snot consistent with exercise at home.  Wille Glaser will be graduating next week!   He improved his post 6MWT by 275 ft!!  He will continue to exercise by walking and using treadmill at home.      Expected Outcomes  Short : exercise at home Long : maintain fitness when HT completed  Short: Graduate  Long: Continue to exericse independently         Nutrition & Weight - Outcomes: Pre Biometrics - 05/01/19 1021      Pre Biometrics   Height  6' 1.5" (1.867 m)    Weight  183 lb 3.2 oz (83.1 kg)    BMI (Calculated)  23.84    Single Leg Stand  30 seconds        Nutrition:   Nutrition Discharge: Nutrition Assessments - 10/23/19 1436      MEDFICTS Scores   Pre Score  27       Education Questionnaire Score: Knowledge Questionnaire Score - 10/23/19 1435      Knowledge Questionnaire Score   Post Score  24/26       Goals reviewed with patient; copy given to patient.

## 2019-10-25 ENCOUNTER — Other Ambulatory Visit: Payer: Self-pay

## 2019-10-25 ENCOUNTER — Telehealth: Payer: Self-pay | Admitting: Cardiology

## 2019-10-25 ENCOUNTER — Encounter: Payer: No Typology Code available for payment source | Admitting: *Deleted

## 2019-10-25 DIAGNOSIS — I213 ST elevation (STEMI) myocardial infarction of unspecified site: Secondary | ICD-10-CM

## 2019-10-25 NOTE — Telephone Encounter (Signed)
Pt states he was just calling to inform [name] that he was authorized by the [place] to have his INR checks for 6 more months with [facility]. Also, he states he has spoken with Pam in [place] and she hs all the authorization information. Advised pt that I would ensure that [name], RN is aware.

## 2019-10-25 NOTE — Progress Notes (Signed)
Discharge Progress Report  Patient Details  Name: Brian Mcclure MRN: [id number] Date of Birth: March 27, 1956 Referring Provider:     Cardiac Rehab from 05/01/2019 in [facility]  Referring Provider  Lynda Rainwater, MD       Number of Visits: 76  Reason for Discharge:  Patient reached a stable level of exercise. Patient independent in their exercise. Patient has met program and personal goals.  Smoking History:  Social History   Tobacco Use  Smoking Status Current Every Day Smoker  . Packs/day: 1.00  . Years: 40.00  . Pack years: 40.00  . Types: Cigarettes  Smokeless Tobacco Never Used    Diagnosis:  ST elevation myocardial infarction (STEMI), unspecified artery ([place])  ADL UCSD:   Initial Exercise Prescription: Initial Exercise Prescription - 05/01/19 1000      Date of Initial Exercise RX and Referring Provider   Date  05/01/19    Referring Provider  Lynda Rainwater, MD      Treadmill   MPH  3    Grade  1    Minutes  15    METs  3.71      Recumbant Bike   Level  3    RPM  50    Watts  63    Minutes  15    METs  3      NuStep   Level  4    SPM  80    Minutes  15    METs  3      Prescription Details   Frequency (times per week)  2    Duration  Progress to 30 minutes of continuous aerobic without signs/symptoms of physical distress      Intensity   THRR 40-80% of Max Heartrate  94-136    Ratings of Perceived Exertion  11-13    Perceived Dyspnea  0-4      Progression   Progression  Continue to progress workloads to maintain intensity without signs/symptoms of physical distress.      Resistance Training   Training Prescription  Yes    Weight  4 lbs    Reps  10-15       Discharge Exercise Prescription (Final Exercise Prescription Changes): Exercise Prescription Changes - 10/17/19 1600      Response to Exercise   Blood Pressure (Admit)  122/58    Blood Pressure (Exercise)  148/60    Blood Pressure (Exit)  110/70     Heart Rate (Admit)  57 bpm    Heart Rate (Exercise)  126 bpm    Heart Rate (Exit)  72 bpm    Rating of Perceived Exertion (Exercise)  13    Symptoms  none    Duration  Continue with 30 min of aerobic exercise without signs/symptoms of physical distress.    Intensity  THRR unchanged      Progression   Progression  Continue to progress workloads to maintain intensity without signs/symptoms of physical distress.    Average METs  7.1      Resistance Training   Training Prescription  Yes    Weight   6 lb    Reps  10-15      Interval Training   Interval Training  No      Treadmill   MPH  3.2    Grade  8    Minutes  15    METs  7      Recumbant Bike   Level  6  Minutes  15    METs  7.2      NuStep   Level  8    Minutes  15    METs  7.1      Home Exercise Plan   Plans to continue exercise at  Home (comment)   walking   Frequency  Add 2 additional days to program exercise sessions.    Initial Home Exercises Provided  05/08/19       Functional Capacity: 6 Minute Walk    Row Name 05/01/19 1017 10/13/19 1141       6 Minute Walk   Phase  Initial  Discharge    Distance  1765 feet  2040 feet    Distance % Change  --  15.5 %    Distance Feet Change  --  275 ft    Walk Time  6 minutes  6 minutes    # of Rest Breaks  0  0    MPH  3.34  3.86    METS  4.37  5.7    RPE  10  12    Perceived Dyspnea   1  2    VO2 Peak  15.3  19.97    Symptoms  Yes (comment)  Yes (comment)    Comments  claudication pain 3/5, SOB  caludication pain    Resting HR  52 bpm  55 bpm    Resting BP  118/64  134/68    Resting Oxygen Saturation   99 %  --    Exercise Oxygen Saturation  during 6 min walk  100 %  --    Max Ex. HR  92 bpm  154 bpm    Max Ex. BP  132/74  158/64    2 Minute Post BP  124/62  --       Psychological, QOL, Others - Outcomes: PHQ 2/9: Depression screen [facility] 2/9 10/23/2019 05/01/2019  Decreased Interest 0 0  Down, Depressed, Hopeless 0 0  PHQ - 2 Score 0 0  Altered  sleeping 0 0  Tired, decreased energy 1 1  Change in appetite 0 1  Feeling bad or failure about yourself  0 0  Trouble concentrating 0 0  Moving slowly or fidgety/restless 0 0  Suicidal thoughts 0 0  PHQ-9 Score 1 2  Difficult doing work/chores Somewhat difficult Somewhat difficult    Quality of Life: Quality of Life - 10/23/19 1435      Quality of Life Scores   Health/Function Post  23.17 %    Socioeconomic Post  21 %    Psych/Spiritual Post  24.64 %    Family Post  25.9 %    GLOBAL Post  23.5 %       Personal Goals: Goals established at orientation with interventions provided to work toward goal. Personal Goals and Risk Factors at Admission - 05/01/19 1024      Core Components/Risk Factors/Patient Goals on Admission    Weight Management  Yes;Weight Maintenance    Intervention  Weight Management: Develop a combined nutrition and exercise program designed to reach desired caloric intake, while maintaining appropriate intake of nutrient and fiber, sodium and fats, and appropriate energy expenditure required for the weight goal.;Weight Management: Provide education and appropriate resources to help participant work on and attain dietary goals.    Admit Weight  183 lb 3.2 oz (83.1 kg)    Goal Weight: Short Term  183 lb (83 kg)    Goal Weight: Long Term  183 lb (83 kg)    Expected Outcomes  Short Term: Continue to assess and modify interventions until short term weight is achieved;Long Term: Adherence to nutrition and physical activity/exercise program aimed toward attainment of established weight goal;Weight Maintenance: Understanding of the daily nutrition guidelines, which includes 25-35% calories from fat, 7% or less cal from saturated fats, less than '200mg'$  cholesterol, less than 1.5gm of sodium, & 5 or more servings of fruits and vegetables daily    Number of packs per day  1 pack/ day    Intervention  Assist the participant in steps to quit. Provide individualized education and  counseling about committing to Tobacco Cessation, relapse prevention, and pharmacological support that can be provided by physician.;Advice worker, assist with locating and accessing local/national Quit Smoking programs, and support quit date choice.    Expected Outcomes  Short Term: Will demonstrate readiness to quit, by selecting a quit date.;Short Term: Will quit all tobacco product use, adhering to prevention of relapse plan.;Long Term: Complete abstinence from all tobacco products for at least 12 months from quit date.    Lipids  Yes    Intervention  Provide education and support for participant on nutrition & aerobic/resistive exercise along with prescribed medications to achieve LDL '70mg'$ , HDL >'40mg'$ .    Expected Outcomes  Short Term: Participant states understanding of desired cholesterol values and is compliant with medications prescribed. Participant is following exercise prescription and nutrition guidelines.;Long Term: Cholesterol controlled with medications as prescribed, with individualized exercise RX and with personalized nutrition plan. Value goals: LDL < '70mg'$ , HDL > 40 mg.        Personal Goals Discharge: Goals and Risk Factor Review    Row Name 05/10/19 0858 06/12/19 0943 08/29/19 1141 09/29/19 1132       Core Components/Risk Factors/Patient Goals Review   Personal Goals Review  Weight Management/Obesity;Improve shortness of breath with ADL's;Tobacco Cessation;Lipids  Weight Management/Obesity;Tobacco Cessation;Lipids;Improve shortness of breath with ADL's  Weight Management/Obesity;Tobacco Cessation;Lipids;Improve shortness of breath with ADL's  Weight Management/Obesity;Tobacco Cessation;Lipids;Improve shortness of breath with ADL's    Review  Brian Mcclure smokes a pack a day and does not have any interest in quitting. He is managing his weight and staying around 183 pounds. His lipids are doing well and he is taking medication for it. He has labs done recently and he  states it was below 30.  Brian Mcclure continues to maintain his weight, and take all medications as perscribed to control lipid levels. He continues to smoke a pack and day and is not interested in quitting. Patient stated that shortness of breath does not limit him so much, but more leg fatigue. He does feel that his stamina is increasing and he does not have to take as many rest breaks while doing physical work as he used to. He is now able to work at a steady pace.  Brian Mcclure is doing well in rehab. His weight is staying steady.  His blood pressures continue do well.  He still has some SOB, but it has improved some since he has started.  He is still smoking 1 ppd and not ready to quit currently.  Brian Mcclure is doing well in rehab. His weight is up some, he is hoping its muscle.  He is running some on treadmill, but SOB still seems to be his limit.    He is currently on a statin vacation and his legs are feeling better.  He is hoping that they will either try something different or lower his dose  once back on again.   Still at 1ppd and not ready to quit.    Expected Outcomes  Short: continue to exercise to improve ADL'S. Long: maintain ADL independently.  Short: continue to exercise to improve ADL'S. Long: maintain ADL independently.  Short: Continue to exercise to help with SOB.  Long: Continue to maintain exercise.  Short: Continue to exercise to help with SOB.  Long: Continue to maintain exercise.       Exercise Goals and Review: Exercise Goals    Row Name 05/01/19 1021             Exercise Goals   Increase Physical Activity  Yes       Intervention  Provide advice, education, support and counseling about physical activity/exercise needs.;Develop an individualized exercise prescription for aerobic and resistive training based on initial evaluation findings, risk stratification, comorbidities and participant's personal goals.       Expected Outcomes  Short Term: Attend rehab on a regular basis to increase amount of  physical activity.;Long Term: Add in home exercise to make exercise part of routine and to increase amount of physical activity.;Long Term: Exercising regularly at least 3-5 days a week.       Increase Strength and Stamina  Yes       Intervention  Provide advice, education, support and counseling about physical activity/exercise needs.;Develop an individualized exercise prescription for aerobic and resistive training based on initial evaluation findings, risk stratification, comorbidities and participant's personal goals.       Expected Outcomes  Short Term: Increase workloads from initial exercise prescription for resistance, speed, and METs.;Short Term: Perform resistance training exercises routinely during rehab and add in resistance training at home;Long Term: Improve cardiorespiratory fitness, muscular endurance and strength as measured by increased METs and functional capacity (6MWT)       Able to understand and use rate of perceived exertion (RPE) scale  Yes       Intervention  Provide education and explanation on how to use RPE scale       Expected Outcomes  Short Term: Able to use RPE daily in rehab to express subjective intensity level;Long Term:  Able to use RPE to guide intensity level when exercising independently       Able to understand and use Dyspnea scale  Yes       Intervention  Provide education and explanation on how to use Dyspnea scale       Expected Outcomes  Short Term: Able to use Dyspnea scale daily in rehab to express subjective sense of shortness of breath during exertion;Long Term: Able to use Dyspnea scale to guide intensity level when exercising independently       Knowledge and understanding of Target Heart Rate Range (THRR)  Yes       Intervention  Provide education and explanation of THRR including how the numbers were predicted and where they are located for reference       Expected Outcomes  Short Term: Able to state/look up THRR;Short Term: Able to use daily as  guideline for intensity in rehab;Long Term: Able to use THRR to govern intensity when exercising independently       Able to check pulse independently  Yes       Intervention  Provide education and demonstration on how to check pulse in carotid and radial arteries.;Review the importance of being able to check your own pulse for safety during independent exercise       Expected Outcomes  Short Term:  Able to explain why pulse checking is important during independent exercise;Long Term: Able to check pulse independently and accurately       Understanding of Exercise Prescription  Yes       Intervention  Provide education, explanation, and written materials on patient's individual exercise prescription       Expected Outcomes  Short Term: Able to explain program exercise prescription;Long Term: Able to explain home exercise prescription to exercise independently       Improve claudication pain toleration; Improve walking ability  Yes       Intervention  Participate in PAD/SET Rehab 2-3 days a week, walking at home as part of exercise prescription;Attend education sessions to aid in risk factor modification and understanding of disease process       Expected Outcomes  Short Term: Improve walking distance/time to onset of claudication pain;Long Term: Improve walking ability and toleration to claudication          Exercise Goals Re-Evaluation: Exercise Goals Re-Evaluation    Row Name 05/01/19 1026 05/08/19 1022 05/10/19 0847 05/18/19 1413 05/31/19 1225     Exercise Goal Re-Evaluation   Exercise Goals Review  Increase Physical Activity;Increase Strength and Stamina;Understanding of Exercise Prescription  Increase Physical Activity;Increase Strength and Stamina;Understanding of Exercise Prescription;Able to understand and use rate of perceived exertion (RPE) scale;Knowledge and understanding of Target Heart Rate Range (THRR);Able to check pulse independently  Understanding of Exercise Prescription;Increase  Strength and Stamina;Increase Physical Activity  Increase Physical Activity;Increase Strength and Stamina;Able to understand and use rate of perceived exertion (RPE) scale;Knowledge and understanding of Target Heart Rate Range (THRR);Able to check pulse independently;Understanding of Exercise Prescription  Increase Physical Activity;Increase Strength and Stamina;Understanding of Exercise Prescription   Comments  Reviewed RPE scale, THR and program prescription with pt today.  Pt voiced understanding and was given a copy of goals to take home.  Reviewed home exercise with pt today.  Pt plans to walk and do yard work for exercise.  Reviewed THR, pulse, RPE, sign and symptoms, NTG use, and when to call 911 or MD.  Also discussed weather considerations and indoor options.  Pt voiced understanding.  Patient states that he will walk when he is done with rehab. Patient has a pulse ox to chech is heart rate and oxygen. Brian Mcclure will have home exercise when he has had a little more time to get use to rehab. Explained to patient why it is important to continue exercising when he is done with the program. He wants to be able to jog more.  Jon tolerates exercise well and worked at around 4 METS.  Staff will work with him on getting back to jogging  Brian Mcclure has been doing well in rehab.  He is up to level 7 on the NuStep.  We will continue to monitor his progress.   Expected Outcomes  Short: Use RPE daily to regulate intensity. Long: Follow program prescription in THR.  Short: add 1-2 days of walking at home following home exercise guidlines. Long: Become indepenedent with exercise program.  Short: increase strength and stamina. Long: be able to jog.  Short - attend consistently Long - incorporate joggging into program  Short: Walk on vacation.  Long: Continue to improve stamina.   [place] Name 06/12/19 0941 06/13/19 1437 06/28/19 1521 07/28/19 1050 08/09/19 1518     Exercise Goal Re-Evaluation   Exercise Goals Review  Increase  Physical Activity;Increase Strength and Stamina;Understanding of Exercise Prescription  Increase Physical Activity;Increase Strength and Stamina;Able to understand  and use rate of perceived exertion (RPE) scale;Able to understand and use Dyspnea scale;Understanding of Exercise Prescription  Increase Physical Activity;Increase Strength and Stamina;Understanding of Exercise Prescription  --  Increase Physical Activity;Increase Strength and Stamina;Able to understand and use rate of perceived exertion (RPE) scale;Able to understand and use Dyspnea scale;Knowledge and understanding of Target Heart Rate Range (THRR);Able to check pulse independently;Understanding of Exercise Prescription   Comments  Brian Mcclure continues to attend cardiac rehab regularly. He reports doing some form of exercise on all days he is not in class. He has made steady increases in his workloads in class and reports he feels he has had an increase in stamina and is able to do more at home without taking as many breaks.  Brian Mcclure was out for two weeks on vacation.  He has been able to maintain previous levels on machines  Brian Mcclure is doing well in rehab.  He was able to return to his previous workloads and should now be ready to start to move up some more or talk about intervals.  We will continue to monitor his progress.  Out since last review.  Brian Mcclure missed some time due to the holidays but has maintained levels on machines.   Expected Outcomes  Short: continue to attend cariac rehab classes regularly. Long: Continue to impove stamina and become independent with exercise program.  Short - continueto attend consistently Long - increase MET level  Short: Talk about intervals.  Long: Continue to improve stamina.  --  Short - try intervals Long:  exercise consistently when not at program sessions   Row Name 08/23/19 1219 08/29/19 1136 09/06/19 0932 09/20/19 1426 09/29/19 1131     Exercise Goal Re-Evaluation   Exercise Goals Review  --  Increase Physical  Activity;Increase Strength and Stamina;Understanding of Exercise Prescription  --  Increase Physical Activity;Increase Strength and Stamina;Understanding of Exercise Prescription  Increase Physical Activity;Increase Strength and Stamina;Understanding of Exercise Prescription   Comments  Out since last review due to COVID exposure.  Brian Mcclure is doing well in rehab. Marland KitchenHe is glad to be getting back to regular class.  He has not been as consistent at home, but wants to get back to it.  He walks on his own and some at work too.  He feels that his strength and stamina are improving.  Brian Mcclure continues to make steady progress in rehab.  He varies TM speed and grade for variety.  He works at correct HR and RPE range  Brian Mcclure has been doing well in rehab.  He is up to level 6 on the NuStep.  We will continue to monitor his progress.  Brian Mcclure is doing well in rehab.  He is feeling better and stronger.  He stays busy, but no consistent exericse at home.  His job keeps him active.   Expected Outcomes  --  Short: Continue to add in more exercise at home. Long: Continue to improve strength.  Short - exercise consistently at home Long:  increase overall MET level  Short: Continue to improve on treadmill.  Long: Continue to improve stamina.  Short: Continue to try to add in exercise at home.  Long: Continue to improve stamin.   [place] Name 10/05/19 1306 10/17/19 1607           Exercise Goal Re-Evaluation   Exercise Goals Review  Increase Physical Activity;Increase Strength and Stamina;Able to understand and use rate of perceived exertion (RPE) scale;Able to understand and use Dyspnea scale;Knowledge and understanding of Target Heart Rate Range (  THRR);Able to check pulse independently;Understanding of Exercise Prescription  Increase Physical Activity;Increase Strength and Stamina;Understanding of Exercise Prescription      Comments  Brian Mcclure is up to level 8 on NS and 6 lb strength training.  Staff will review the importance of exercise for  cardiovascular health since he i snot consistent with exercise at home.  Brian Mcclure will be graduating next week!  He improved his post 6MWT by 275 ft!!  He will continue to exercise by walking and using treadmill at home.      Expected Outcomes  Short : exercise at home Long : maintain fitness when HT completed  Short: Graduate  Long: Continue to exericse independently         Nutrition & Weight - Outcomes: Pre Biometrics - 05/01/19 1021      Pre Biometrics   Height  6' 1.5" (1.867 m)    Weight  183 lb 3.2 oz (83.1 kg)    BMI (Calculated)  23.84    Single Leg Stand  30 seconds        Nutrition:   Nutrition Discharge: Nutrition Assessments - 10/23/19 1436      MEDFICTS Scores   Pre Score  27       Education Questionnaire Score: Knowledge Questionnaire Score - 10/23/19 1435      Knowledge Questionnaire Score   Post Score  24/26       Goals reviewed with patient; copy given to patient.

## 2019-10-25 NOTE — Telephone Encounter (Signed)
Brian Mcclure is calling to inform [name] he got approval from the [place] for extended INR checks. Please advise.

## 2019-10-25 NOTE — Progress Notes (Signed)
Cardiac Individual Treatment Plan  Patient Details  Name: DRAYCEN LEICHTER MRN: [id number] Date of Birth: 07/19/1956 Referring Provider:     Cardiac Rehab from 05/01/2019 in [facility] Cardiac and Pulmonary Rehab  Referring Provider  Lynda Rainwater, MD      Initial Encounter Date:    Cardiac Rehab from 05/01/2019 in [facility] Cardiac and Pulmonary Rehab  Date  05/01/19      Visit Diagnosis: ST elevation myocardial infarction (STEMI), unspecified artery ([place])  Patient's Home Medications on Admission:  Current Outpatient Medications:  .  Ca Carbonate-Mag Hydroxide (ROLAIDS PO), Take 1 tablet by mouth at bedtime as needed (heartburn/acid reflux)., Disp: , Rfl:  .  cholecalciferol (VITAMIN D) 25 MCG (1000 UT) tablet, Take 1,000 Units by mouth 2 (two) times daily., Disp: , Rfl:  .  clopidogrel (PLAVIX) 75 MG tablet, Take 1 tablet (75 mg total) by mouth daily., Disp: 30 tablet, Rfl: 1 .  diclofenac sodium (VOLTAREN) 1 % GEL, Apply 1 application topically daily as needed (pain)., Disp: , Rfl:  .  famotidine (PEPCID) 20 MG tablet, Take 20 mg by mouth daily. , Disp: , Rfl:  .  losartan (COZAAR) 25 MG tablet, Take 0.5 tablets (12.5 mg total) by mouth daily., Disp: 15 tablet, Rfl: 1 .  metoprolol succinate (TOPROL XL) 25 MG 24 hr tablet, Take 0.5 tablets (12.5 mg total) by mouth daily., Disp: 45 tablet, Rfl: 3 .  nitroGLYCERIN (NITROSTAT) 0.4 MG SL tablet, Place 1 tablet (0.4 mg total) under the tongue every 5 (five) minutes x 3 doses as needed for chest pain., Disp: 25 tablet, Rfl: 12 .  oxymetazoline (AFRIN) 0.05 % nasal spray, Place 1 spray into both nostrils 2 (two) times daily as needed for congestion., Disp: , Rfl:  .  rosuvastatin (CRESTOR) 40 MG tablet, Take 1 tablet (40 mg total) by mouth at bedtime., Disp: 30 tablet, Rfl: 1 .  Simethicone (GAS-X PO), Take 1 tablet by mouth 3 (three) times daily as needed (heartburn)., Disp: , Rfl:  .  warfarin (COUMADIN) 5 MG tablet, Take 1/2 tablet daily or as  directed by the anti-coag clinic., Disp: 50 tablet, Rfl: 0  Past Medical History: Past Medical History:  Diagnosis Date  . Actinic keratosis   . Acute ST elevation myocardial infarction (STEMI) of anterior wall ([place])    a. 02/2019 s/p PCI/DES ->LAD.  Marland Kitchen Apical mural thrombus following MI ([place])    a. 02/2019 s/p Ant MI-->Echo: Small, fixed thrombus on the apical wall of LV-->coumadin.  . Barrett's esophagus   . CAD (coronary artery disease)    a. 02/2019 Ant STEMI/PCI: LM nl, LAD 100p (4x18 Resolute Onyx DES), LCX/RCA min irregs, EF 35-45%.  . Chronic back pain   . Colon polyps    a. 07/2017 Colonoscopy: 7m sigmoid polyp, tubular adenoma, diverticulosis, int hemorrhoids.  .Marland KitchenCOPD (chronic obstructive pulmonary disease) ([place]   . Degeneration of lumbar intervertebral disc   . Dysphonia   . GERD (gastroesophageal reflux disease)   . HFrEF (heart failure with reduced ejection fraction) ([place]    a. 02/2019 Echo: EF 40-45%, apical, septal, inf, and inforlateral AK. Mid and apical anterior/mid anteroseptal AK. Mildly dil LA.  .Marland KitchenHydrocele   . Hyperlipidemia   . Ischemic cardiomyopathy    a. 02/2019 Echo: EF 40-45%.  . Myalgia   . Osteoarthritis    C-spine  . PAF (paroxysmal atrial fibrillation) ([facility])    a. CHA2DS2VASc = 2.  . Peripheral vascular disease ([place]    a.  2018 s/p aorto-iliac stent graft.  . Reactive airway disease   . Seborrheic keratosis   . Sinus bradycardia    a. 02/2019 Jxnl rhythm and sinus brady in setting of Ant MI-->[facility] d/c'd.  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  . Tobacco use disorder   . Tremor     Tobacco Use: Social History   Tobacco Use  Smoking Status Current Every Day Smoker  . Packs/day: 1.00  . Years: 40.00  . Pack years: 40.00  . Types: Cigarettes  Smokeless Tobacco Never Used    Labs: Recent Review Flowsheet Data    Labs for ITP Cardiac and Pulmonary Rehab Latest Ref Rng & Units 03/12/2019 05/03/2019   Cholestrol 100 -  199 mg/dL 104 96(L)   LDLCALC 0 - 99 mg/dL 59 46   HDL >39 mg/dL 31(L) 34(L)   Trlycerides 0 - 149 mg/dL 70 79   Hemoglobin A1c 4.8 - 5.6 % 6.0(H) -   TCO2 22 - 32 mmol/L 18(L) -       Exercise Target Goals: Exercise Program Goal: Individual exercise prescription set using results from initial 6 min walk test and THRR while considering  patient's activity barriers and safety.   Exercise Prescription Goal: Initial exercise prescription builds to 30-45 minutes a day of aerobic activity, 2-3 days per week.  Home exercise guidelines will be given to patient during program as part of exercise prescription that the participant will acknowledge.   Education: Aerobic Exercise & Resistance Training: - Gives group verbal and written instruction on the various components of exercise. Focuses on aerobic and resistive training programs and the benefits of this training and how to safely progress through these programs..   Education: Exercise & Equipment Safety: - Individual verbal instruction and demonstration of equipment use and safety with use of the equipment.   Education: Exercise Physiology & General Exercise Guidelines: - Group verbal and written instruction with models to review the exercise physiology of the cardiovascular system and associated critical values. Provides general exercise guidelines with specific guidelines to those with heart or lung disease.    Education: Flexibility, Balance, Mind/Body Relaxation: Provides group verbal/written instruction on the benefits of flexibility and balance training, including mind/body exercise modes such as yoga, pilates and tai chi.  Demonstration and skill practice provided.   Activity Barriers & Risk Stratification: Activity Barriers & Cardiac Risk Stratification - 05/01/19 1017      Activity Barriers & Cardiac Risk Stratification   Activity Barriers  Joint Problems;Back Problems;Muscular Weakness;Deconditioning;Other (comment)     Comments  claudication pain    Cardiac Risk Stratification  High       6 Minute Walk: 6 Minute Walk    Row Name 05/01/19 1017 10/13/19 1141       6 Minute Walk   Phase  Initial  Discharge    Distance  1765 feet  2040 feet    Distance % Change  --  15.5 %    Distance Feet Change  --  275 ft    Walk Time  6 minutes  6 minutes    # of Rest Breaks  0  0    MPH  3.34  3.86    METS  4.37  5.7    RPE  10  12    Perceived Dyspnea   1  2    VO2 Peak  15.3  19.97    Symptoms  Yes (comment)  Yes (comment)  Comments  claudication pain 3/5, SOB  caludication pain    Resting HR  52 bpm  55 bpm    Resting BP  118/64  134/68    Resting Oxygen Saturation   99 %  --    Exercise Oxygen Saturation  during 6 min walk  100 %  --    Max Ex. HR  92 bpm  154 bpm    Max Ex. BP  132/74  158/64    2 Minute Post BP  124/62  --       Oxygen Initial Assessment:   Oxygen Re-Evaluation:   Oxygen Discharge (Final Oxygen Re-Evaluation):   Initial Exercise Prescription: Initial Exercise Prescription - 05/01/19 1000      Date of Initial Exercise RX and Referring Provider   Date  05/01/19    Referring Provider  Lynda Rainwater, MD      Treadmill   MPH  3    Grade  1    Minutes  15    METs  3.71      Recumbant Bike   Level  3    RPM  50    Watts  63    Minutes  15    METs  3      NuStep   Level  4    SPM  80    Minutes  15    METs  3      Prescription Details   Frequency (times per week)  2    Duration  Progress to 30 minutes of continuous aerobic without signs/symptoms of physical distress      Intensity   THRR 40-80% of Max Heartrate  94-136    Ratings of Perceived Exertion  11-13    Perceived Dyspnea  0-4      Progression   Progression  Continue to progress workloads to maintain intensity without signs/symptoms of physical distress.      Resistance Training   Training Prescription  Yes    Weight  4 lbs    Reps  10-15       Perform Capillary Blood Glucose checks as  needed.  Exercise Prescription Changes: Exercise Prescription Changes    Row Name 05/01/19 1000 05/18/19 1400 05/31/19 1200 06/13/19 1400 06/28/19 1500     Response to Exercise   Blood Pressure (Admit)  118/64  122/64  120/60  110/60  128/62   Blood Pressure (Exercise)  132/74  134/64  126/60  142/70  150/60   Blood Pressure (Exit)  124/62  96/56  110/62  122/64  130/62   Heart Rate (Admit)  52 bpm  83 bpm  69 bpm  61 bpm  70 bpm   Heart Rate (Exercise)  92 bpm  117 bpm  118 bpm  121 bpm  114 bpm   Heart Rate (Exit)  --  82 bpm  77 bpm  74 bpm  70 bpm   Oxygen Saturation (Admit)  99 %  --  --  --  --   Oxygen Saturation (Exercise)  100 %  --  --  --  --   Rating of Perceived Exertion (Exercise)  10  --  '13  14  15   '$ Perceived Dyspnea (Exercise)  1  --  --  --  --   Symptoms  claudication pain 3/5, SOB  none  none  none  none   Comments  walk test results  --  --  --  --   Duration  --  Continue with 30 min of aerobic exercise without signs/symptoms of physical distress.  Continue with 30 min of aerobic exercise without signs/symptoms of physical distress.  Continue with 30 min of aerobic exercise without signs/symptoms of physical distress.  Continue with 30 min of aerobic exercise without signs/symptoms of physical distress.   Intensity  --  THRR unchanged  THRR unchanged  THRR unchanged  THRR unchanged     Progression   Progression  --  Continue to progress workloads to maintain intensity without signs/symptoms of physical distress.  Continue to progress workloads to maintain intensity without signs/symptoms of physical distress.  Continue to progress workloads to maintain intensity without signs/symptoms of physical distress.  Continue to progress workloads to maintain intensity without signs/symptoms of physical distress.   Average METs  --  4.05  5.47  5.6  5.29     Resistance Training   Training Prescription  --  Yes  Yes  Yes  Yes   Weight  --  0 ROM only due to shoulder problem  6  lbs  6 lb  6 lb   Reps  --  10-15  10-15  10-15  10-15     Interval Training   Interval Training  --  --  No  No  No     Treadmill   MPH  --  3.5  3.5  3.5  3.5   Grade  --  '1  1  1  1   '$ Minutes  --  '15  15  15  15   '$ METs  --  4.16  4.16  4.16  4.16     Recumbant Bike   Level  --  --  '4  4  4   '$ Minutes  --  --  '15  15  15   '$ METs  --  --  5.5  5.5  --     NuStep   Level  --  --  '7  7  7   '$ Minutes  --  --  '15  15  15   '$ METs  --  --  6.8  6.6  8.7     Recumbant Elliptical   Level  --  --  --  --  4.5   Minutes  --  --  --  --  15   METs  --  --  --  --  3     T5 Nustep   Level  --  7  --  --  --   SPM  --  80  --  --  --   Minutes  --  15  --  --  --   METs  --  4  --  --  --     Home Exercise Plan   Plans to continue exercise at  --  --  Home (comment) walking  Home (comment) walking  Home (comment) walking   Frequency  --  --  Add 2 additional days to program exercise sessions.  Add 2 additional days to program exercise sessions.  Add 2 additional days to program exercise sessions.   Initial Home Exercises Provided  --  --  05/08/19  05/08/19  05/08/19   Row Name 08/09/19 1500 09/06/19 0900 09/20/19 1400 10/05/19 1300 10/17/19 1600     Response to Exercise   Blood Pressure (Admit)  130/70  126/64  126/60  118/62  122/58   Blood Pressure (Exercise)  132/58  152/74  124/70  142/60  148/60  Blood Pressure (Exit)  126/50  126/62  118/66  104/58  110/70   Heart Rate (Admit)  55 bpm  64 bpm  68 bpm  59 bpm  57 bpm   Heart Rate (Exercise)  108 bpm  112 bpm  112 bpm  113 bpm  126 bpm   Heart Rate (Exit)  89 bpm  86 bpm  76 bpm  80 bpm  72 bpm   Rating of Perceived Exertion (Exercise)  '12  12  13  13  13   '$ Symptoms  none  none  none   none  none   Duration  Continue with 30 min of aerobic exercise without signs/symptoms of physical distress.  Continue with 30 min of aerobic exercise without signs/symptoms of physical distress.  Continue with 30 min of aerobic exercise without  signs/symptoms of physical distress.  Continue with 30 min of aerobic exercise without signs/symptoms of physical distress.  Continue with 30 min of aerobic exercise without signs/symptoms of physical distress.   Intensity  THRR unchanged  THRR unchanged  THRR unchanged  THRR unchanged  THRR unchanged     Progression   Progression  Continue to progress workloads to maintain intensity without signs/symptoms of physical distress.  Continue to progress workloads to maintain intensity without signs/symptoms of physical distress.  Continue to progress workloads to maintain intensity without signs/symptoms of physical distress.  Continue to progress workloads to maintain intensity without signs/symptoms of physical distress.  Continue to progress workloads to maintain intensity without signs/symptoms of physical distress.   Average METs  5  5.2  5.06  5.36  7.1     Resistance Training   Training Prescription  Yes  Yes  Yes  Yes  Yes   Weight  6 lb  6 lb  6 lb   6 lb   6 lb   Reps  10-15  10-15  10-15  10-15  10-15     Interval Training   Interval Training  No  No  No  No  No     Treadmill   MPH  --  3.9  3.5  3.3  3.2   Grade  --  '1  1  3  8   '$ Minutes  --  '15  15  15  15   '$ METs  --  5.2  4.16  4.89  7     Recumbant Bike   Level  6  --  4  --  6   Minutes  15  --  15  --  15   METs  4.36  --  4.4  --  7.2     NuStep   Level  --  --  6  --  8   Minutes  --  --  15  --  15   METs  --  --  6.6  --  7.1     Recumbant Elliptical   Level  10  2  --  --  --   Minutes  15  15  --  --  --   METs  6  3  --  --  --     T5 Nustep   Level  --  --  7  --  --   Minutes  --  --  15  --  --   METs  --  --  5.1  --  --     Home Exercise Plan   Plans to continue exercise  at  Home (comment) walking  Home (comment) walking  Home (comment) walking  Home (comment) walking  Home (comment) walking   Frequency  Add 2 additional days to program exercise sessions.  Add 2 additional days to program exercise  sessions.  Add 2 additional days to program exercise sessions.  Add 2 additional days to program exercise sessions.  Add 2 additional days to program exercise sessions.   Initial Home Exercises Provided  05/08/19  05/08/19  05/08/19  05/08/19  05/08/19      Exercise Comments: Exercise Comments    Row Name 05/01/19 0948           Exercise Comments  First full day of exercise!  Patient was oriented to gym and equipment including functions, settings, policies, and procedures.  Patient's individual exercise prescription and treatment plan were reviewed.  All starting workloads were established based on the results of the 6 minute walk test done at initial orientation visit.  The plan for exercise progression was also introduced and progression will be customized based on patient's performance and goals.          Exercise Goals and Review: Exercise Goals    Row Name 05/01/19 1021             Exercise Goals   Increase Physical Activity  Yes       Intervention  Provide advice, education, support and counseling about physical activity/exercise needs.;Develop an individualized exercise prescription for aerobic and resistive training based on initial evaluation findings, risk stratification, comorbidities and participant's personal goals.       Expected Outcomes  Short Term: Attend rehab on a regular basis to increase amount of physical activity.;Long Term: Add in home exercise to make exercise part of routine and to increase amount of physical activity.;Long Term: Exercising regularly at least 3-5 days a week.       Increase Strength and Stamina  Yes       Intervention  Provide advice, education, support and counseling about physical activity/exercise needs.;Develop an individualized exercise prescription for aerobic and resistive training based on initial evaluation findings, risk stratification, comorbidities and participant's personal goals.       Expected Outcomes  Short Term: Increase  workloads from initial exercise prescription for resistance, speed, and METs.;Short Term: Perform resistance training exercises routinely during rehab and add in resistance training at home;Long Term: Improve cardiorespiratory fitness, muscular endurance and strength as measured by increased METs and functional capacity (6MWT)       Able to understand and use rate of perceived exertion (RPE) scale  Yes       Intervention  Provide education and explanation on how to use RPE scale       Expected Outcomes  Short Term: Able to use RPE daily in rehab to express subjective intensity level;Long Term:  Able to use RPE to guide intensity level when exercising independently       Able to understand and use Dyspnea scale  Yes       Intervention  Provide education and explanation on how to use Dyspnea scale       Expected Outcomes  Short Term: Able to use Dyspnea scale daily in rehab to express subjective sense of shortness of breath during exertion;Long Term: Able to use Dyspnea scale to guide intensity level when exercising independently       Knowledge and understanding of Target Heart Rate Range (THRR)  Yes       Intervention  Provide education and explanation  of THRR including how the numbers were predicted and where they are located for reference       Expected Outcomes  Short Term: Able to state/look up THRR;Short Term: Able to use daily as guideline for intensity in rehab;Long Term: Able to use THRR to govern intensity when exercising independently       Able to check pulse independently  Yes       Intervention  Provide education and demonstration on how to check pulse in carotid and radial arteries.;Review the importance of being able to check your own pulse for safety during independent exercise       Expected Outcomes  Short Term: Able to explain why pulse checking is important during independent exercise;Long Term: Able to check pulse independently and accurately       Understanding of Exercise  Prescription  Yes       Intervention  Provide education, explanation, and written materials on patient's individual exercise prescription       Expected Outcomes  Short Term: Able to explain program exercise prescription;Long Term: Able to explain home exercise prescription to exercise independently       Improve claudication pain toleration; Improve walking ability  Yes       Intervention  Participate in PAD/SET Rehab 2-3 days a week, walking at home as part of exercise prescription;Attend education sessions to aid in risk factor modification and understanding of disease process       Expected Outcomes  Short Term: Improve walking distance/time to onset of claudication pain;Long Term: Improve walking ability and toleration to claudication          Exercise Goals Re-Evaluation : Exercise Goals Re-Evaluation    Row Name 05/01/19 1026 05/08/19 1022 05/10/19 0847 05/18/19 1413 05/31/19 1225     Exercise Goal Re-Evaluation   Exercise Goals Review  Increase Physical Activity;Increase Strength and Stamina;Understanding of Exercise Prescription  Increase Physical Activity;Increase Strength and Stamina;Understanding of Exercise Prescription;Able to understand and use rate of perceived exertion (RPE) scale;Knowledge and understanding of Target Heart Rate Range (THRR);Able to check pulse independently  Understanding of Exercise Prescription;Increase Strength and Stamina;Increase Physical Activity  Increase Physical Activity;Increase Strength and Stamina;Able to understand and use rate of perceived exertion (RPE) scale;Knowledge and understanding of Target Heart Rate Range (THRR);Able to check pulse independently;Understanding of Exercise Prescription  Increase Physical Activity;Increase Strength and Stamina;Understanding of Exercise Prescription   Comments  Reviewed RPE scale, THR and program prescription with pt today.  Pt voiced understanding and was given a copy of goals to take home.  Reviewed home exercise  with pt today.  Pt plans to walk and do yard work for exercise.  Reviewed THR, pulse, RPE, sign and symptoms, NTG use, and when to call 911 or MD.  Also discussed weather considerations and indoor options.  Pt voiced understanding.  Patient states that he will walk when he is done with rehab. Patient has a pulse ox to chech is heart rate and oxygen. John will have home exercise when he has had a little more time to get use to rehab. Explained to patient why it is important to continue exercising when he is done with the program. He wants to be able to jog more.  Jon tolerates exercise well and worked at around 4 METS.  Staff will work with him on getting back to jogging  Wille Glaser has been doing well in rehab.  He is up to level 7 on the NuStep.  We will continue to monitor his progress.  Expected Outcomes  Short: Use RPE daily to regulate intensity. Long: Follow program prescription in THR.  Short: add 1-2 days of walking at home following home exercise guidlines. Long: Become indepenedent with exercise program.  Short: increase strength and stamina. Long: be able to jog.  Short - attend consistently Long - incorporate joggging into program  Short: Walk on vacation.  Long: Continue to improve stamina.   [place] Name 06/12/19 0941 06/13/19 1437 06/28/19 1521 07/28/19 1050 08/09/19 1518     Exercise Goal Re-Evaluation   Exercise Goals Review  Increase Physical Activity;Increase Strength and Stamina;Understanding of Exercise Prescription  Increase Physical Activity;Increase Strength and Stamina;Able to understand and use rate of perceived exertion (RPE) scale;Able to understand and use Dyspnea scale;Understanding of Exercise Prescription  Increase Physical Activity;Increase Strength and Stamina;Understanding of Exercise Prescription  --  Increase Physical Activity;Increase Strength and Stamina;Able to understand and use rate of perceived exertion (RPE) scale;Able to understand and use Dyspnea scale;Knowledge and  understanding of Target Heart Rate Range (THRR);Able to check pulse independently;Understanding of Exercise Prescription   Comments  Wille Glaser continues to attend cardiac rehab regularly. He reports doing some form of exercise on all days he is not in class. He has made steady increases in his workloads in class and reports he feels he has had an increase in stamina and is able to do more at home without taking as many breaks.  John was out for two weeks on vacation.  He has been able to maintain previous levels on machines  Wille Glaser is doing well in rehab.  He was able to return to his previous workloads and should now be ready to start to move up some more or talk about intervals.  We will continue to monitor his progress.  Out since last review.  Wille Glaser missed some time due to the holidays but has maintained levels on machines.   Expected Outcomes  Short: continue to attend cariac rehab classes regularly. Long: Continue to impove stamina and become independent with exercise program.  Short - continueto attend consistently Long - increase MET level  Short: Talk about intervals.  Long: Continue to improve stamina.  --  Short - try intervals Long:  exercise consistently when not at program sessions   Row Name 08/23/19 1219 08/29/19 1136 09/06/19 0932 09/20/19 1426 09/29/19 1131     Exercise Goal Re-Evaluation   Exercise Goals Review  --  Increase Physical Activity;Increase Strength and Stamina;Understanding of Exercise Prescription  --  Increase Physical Activity;Increase Strength and Stamina;Understanding of Exercise Prescription  Increase Physical Activity;Increase Strength and Stamina;Understanding of Exercise Prescription   Comments  Out since last review due to COVID exposure.  Wille Glaser is doing well in rehab. Marland KitchenHe is glad to be getting back to regular class.  He has not been as consistent at home, but wants to get back to it.  He walks on his own and some at work too.  He feels that his strength and stamina are improving.   Wille Glaser continues to make steady progress in rehab.  He varies TM speed and grade for variety.  He works at correct HR and RPE range  Wille Glaser has been doing well in rehab.  He is up to level 6 on the NuStep.  We will continue to monitor his progress.  Wille Glaser is doing well in rehab.  He is feeling better and stronger.  He stays busy, but no consistent exericse at home.  His job keeps him active.   Expected Outcomes  --  Short: Continue to add in more exercise at home. Long: Continue to improve strength.  Short - exercise consistently at home Long:  increase overall MET level  Short: Continue to improve on treadmill.  Long: Continue to improve stamina.  Short: Continue to try to add in exercise at home.  Long: Continue to improve stamin.   [place] Name 10/05/19 1306 10/17/19 1607           Exercise Goal Re-Evaluation   Exercise Goals Review  Increase Physical Activity;Increase Strength and Stamina;Able to understand and use rate of perceived exertion (RPE) scale;Able to understand and use Dyspnea scale;Knowledge and understanding of Target Heart Rate Range (THRR);Able to check pulse independently;Understanding of Exercise Prescription  Increase Physical Activity;Increase Strength and Stamina;Understanding of Exercise Prescription      Comments  Wille Glaser is up to level 8 on NS and 6 lb strength training.  Staff will review the importance of exercise for cardiovascular health since he i snot consistent with exercise at home.  Wille Glaser will be graduating next week!  He improved his post 6MWT by 275 ft!!  He will continue to exercise by walking and using treadmill at home.      Expected Outcomes  Short : exercise at home Long : maintain fitness when HT completed  Short: Graduate  Long: Continue to exericse independently         Discharge Exercise Prescription (Final Exercise Prescription Changes): Exercise Prescription Changes - 10/17/19 1600      Response to Exercise   Blood Pressure (Admit)  122/58    Blood Pressure  (Exercise)  148/60    Blood Pressure (Exit)  110/70    Heart Rate (Admit)  57 bpm    Heart Rate (Exercise)  126 bpm    Heart Rate (Exit)  72 bpm    Rating of Perceived Exertion (Exercise)  13    Symptoms  none    Duration  Continue with 30 min of aerobic exercise without signs/symptoms of physical distress.    Intensity  THRR unchanged      Progression   Progression  Continue to progress workloads to maintain intensity without signs/symptoms of physical distress.    Average METs  7.1      Resistance Training   Training Prescription  Yes    Weight   6 lb    Reps  10-15      Interval Training   Interval Training  No      Treadmill   MPH  3.2    Grade  8    Minutes  15    METs  7      Recumbant Bike   Level  6    Minutes  15    METs  7.2      NuStep   Level  8    Minutes  15    METs  7.1      Home Exercise Plan   Plans to continue exercise at  Home (comment)   walking   Frequency  Add 2 additional days to program exercise sessions.    Initial Home Exercises Provided  05/08/19       Nutrition:  Target Goals: Understanding of nutrition guidelines, daily intake of sodium '1500mg'$ , cholesterol '200mg'$ , calories 30% from fat and 7% or less from saturated fats, daily to have 5 or more servings of fruits and vegetables.  Education: Controlling Sodium/Reading Food Labels -Group verbal and written material supporting the discussion of sodium use in heart healthy nutrition.  Review and explanation with models, verbal and written materials for utilization of the food label.   Education: General Nutrition Guidelines/Fats and Fiber: -Group instruction provided by verbal, written material, models and posters to present the general guidelines for heart healthy nutrition. Gives an explanation and review of dietary fats and fiber.   Biometrics: Pre Biometrics - 05/01/19 1021      Pre Biometrics   Height  6' 1.5" (1.867 m)    Weight  183 lb 3.2 oz (83.1 kg)    BMI (Calculated)   23.84    Single Leg Stand  30 seconds        Nutrition Therapy Plan and Nutrition Goals:   Nutrition Assessments: Nutrition Assessments - 10/23/19 1436      MEDFICTS Scores   Pre Score  27       MEDIFICTS Score Key:          ?70 Need to make dietary changes          40-70 Heart Healthy Diet         ? 40 Therapeutic Level Cholesterol Diet  Nutrition Goals Re-Evaluation: Nutrition Goals Re-Evaluation    [place] Name 05/22/19 1024 06/14/19 1884 07/31/19 1530 09/05/19 1141       Goals   Nutrition Goal  ST: eat salads 2x/week LT: feel better  ST: include more vegetables - add 2x/week additional servings LT: feel better  ST: continue to include a variety of foods LT: feel better  ST: continue to include a variety of foods LT: feel better    Comment  Continue with current changes  Pt reports diet is not as good as it could be. Pt able to add salads in some of the time. Pt reports diet stayed mostly the same since we last spoke. Pt reports that he wants to get in more vegetables, but it is hard with a small household (preparing full meals), discussed easy ways to include vegetables into meals he is already having. Pt reported concenr for medication and vitamin K again, reiterated thagt it was about consistency and if he wanted to increase his intake of these foods he would just have to be consistent and talk to his doctor beforehand.  Pt reports eating more venison - making soup out of that with more vegetables. pt reports energy is medium - about the same as before. Pt reports strength is good.  Pt reports that his diet is going well. No questions or concerns. Energy level is stable and strength is good.    Expected Outcome  ST: eat salads 2x/week LT: feel better  ST: include more vegetables - add 2x/week additional servings LT: feel better  ST: continue to include a variety of foods LT: feel better  ST: continue to include a variety of foods LT: feel better       Nutrition Goals Discharge  (Final Nutrition Goals Re-Evaluation): Nutrition Goals Re-Evaluation - 09/05/19 1141      Goals   Nutrition Goal  ST: continue to include a variety of foods LT: feel better    Comment  Pt reports that his diet is going well. No questions or concerns. Energy level is stable and strength is good.    Expected Outcome  ST: continue to include a variety of foods LT: feel better       Psychosocial: Target Goals: Acknowledge presence or absence of significant depression and/or stress, maximize coping skills, provide positive support system. Participant is able to verbalize types and ability to use  techniques and skills needed for reducing stress and depression.   Education: Depression - Provides group verbal and written instruction on the correlation between heart/lung disease and depressed mood, treatment options, and the stigmas associated with seeking treatment.   Education: Sleep Hygiene -Provides group verbal and written instruction about how sleep can affect your health.  Define sleep hygiene, discuss sleep cycles and impact of sleep habits. Review good sleep hygiene tips.     Education: Stress and Anxiety: - Provides group verbal and written instruction about the health risks of elevated stress and causes of high stress.  Discuss the correlation between heart/lung disease and anxiety and treatment options. Review healthy ways to manage with stress and anxiety.    Initial Review & Psychosocial Screening:   Quality of Life Scores:  Quality of Life - 10/23/19 1435      Quality of Life Scores   Health/Function Post  23.17 %    Socioeconomic Post  21 %    Psych/Spiritual Post  24.64 %    Family Post  25.9 %    GLOBAL Post  23.5 %      Scores of 19 and below usually indicate a poorer quality of life in these areas.  A difference of  2-3 points is a clinically meaningful difference.  A difference of 2-3 points in the total score of the Quality of Life Index has been associated with  significant improvement in overall quality of life, self-image, physical symptoms, and general health in studies assessing change in quality of life.  PHQ-9: Recent Review Flowsheet Data    Depression screen [facility] 2/9 10/23/2019 05/01/2019   Decreased Interest 0 0   Down, Depressed, Hopeless 0 0   PHQ - 2 Score 0 0   Altered sleeping 0 0   Tired, decreased energy 1 1   Change in appetite 0 1   Feeling bad or failure about yourself  0 0   Trouble concentrating 0 0   Moving slowly or fidgety/restless 0 0   Suicidal thoughts 0 0   PHQ-9 Score 1 2   Difficult doing work/chores Somewhat difficult Somewhat difficult     Interpretation of Total Score  Total Score Depression Severity:  1-4 = Minimal depression, 5-9 = Mild depression, 10-14 = Moderate depression, 15-19 = Moderately severe depression, 20-27 = Severe depression   Psychosocial Evaluation and Intervention:   Psychosocial Re-Evaluation: Psychosocial Re-Evaluation    Row Name 05/10/19 [id number] 06/12/19 0959 08/29/19 1138 09/29/19 1132 10/13/19 1142     Psychosocial Re-Evaluation   Current issues with  Current Stress Concerns  Current Stress Concerns  None Identified  None Identified  --   Comments  Patient wants to get his health back on track. He wants to do things like remodel and downsize but feels like he does not have enough time left. He feels like he is not checking off things off his bucket list. Jenny Reichmann is retired and finding himself very busy and not enough time to do things around the house.  Patient stated that he has no new stress concerns and feels he is able to manage this aspect of his life. He stated that he feels his stamina is increasing and he can work on project at more of a steady pace now and take fewer breaks. He reports sleeping well at night.  Wille Glaser is doing well mentally.  He is glad to be getting back to a routine again.  He sleeps well and denies any major stressors.  Wille Glaser continues to do well mentally.  He is  enjoying coming to class and sleeping well.  He is not looking forward to pollen season.  Wille Glaser feels the best part of the program was getting him to start exercising. He walks with his wife and is looking for other options for more intense exercise.  They live in [facility] and are not close to a gym.   Expected Outcomes  Short: continue to exercise to reduce stress. Long: maintain exercise to keep stress at a minimum.  Short: continue to exercise to reduce stress. Long: maintain exercise to keep stress at a minimum.  Short: Continue to exercise Long: Continue to stay positive and get into routine.  Short: Continue to exercise Long: Continue to stay positive and get into routine.  Short :continue to walk Long:  establish fitness routine in addition to walking   Interventions  Encouraged to attend Cardiac Rehabilitation for the exercise  --  Encouraged to attend Cardiac Rehabilitation for the exercise  Encouraged to attend Cardiac Rehabilitation for the exercise  --   Continue Psychosocial Services   Follow up required by staff  Follow up required by staff  Follow up required by staff  --  --      Psychosocial Discharge (Final Psychosocial Re-Evaluation): Psychosocial Re-Evaluation - 10/13/19 1142      Psychosocial Re-Evaluation   Comments  Wille Glaser feels the best part of the program was getting him to start exercising. He walks with his wife and is looking for other options for more intense exercise.  They live in [facility] and are not close to a gym.    Expected Outcomes  Short :continue to walk Long:  establish fitness routine in addition to walking       Vocational Rehabilitation: Provide vocational rehab assistance to qualifying candidates.   Vocational Rehab Evaluation & Intervention:   Education: Education Goals: Education classes will be provided on a variety of topics geared toward better understanding of heart health and risk factor modification. Participant will state  understanding/return demonstration of topics presented as noted by education test scores.  Learning Barriers/Preferences:   General Cardiac Education Topics:  AED/CPR: - Group verbal and written instruction with the use of models to demonstrate the basic use of the AED with the basic ABC's of resuscitation.   Anatomy & Physiology of the Heart: - Group verbal and written instruction and models provide basic cardiac anatomy and physiology, with the coronary electrical and arterial systems. Review of Valvular disease and Heart Failure   Cardiac Procedures: - Group verbal and written instruction to review commonly prescribed medications for heart disease. Reviews the medication, class of the drug, and side effects. Includes the steps to properly store meds and maintain the prescription regimen. (beta blockers and nitrates)   Cardiac Medications I: - Group verbal and written instruction to review commonly prescribed medications for heart disease. Reviews the medication, class of the drug, and side effects. Includes the steps to properly store meds and maintain the prescription regimen.   Cardiac Medications II: -Group verbal and written instruction to review commonly prescribed medications for heart disease. Reviews the medication, class of the drug, and side effects. (all other drug classes)    Go Sex-Intimacy & Heart Disease, Get SMART - Goal Setting: - Group verbal and written instruction through game format to discuss heart disease and the return to sexual intimacy. Provides group verbal and written material to discuss and apply goal setting through the application of the S.M.A.R.T.  Method.   Other Matters of the Heart: - Provides group verbal, written materials and models to describe Stable Angina and Peripheral Artery. Includes description of the disease process and treatment options available to the cardiac patient.   Infection Prevention: - Provides verbal and written material  to individual with discussion of infection control including proper hand washing and proper equipment cleaning during exercise session.   Falls Prevention: - Provides verbal and written material to individual with discussion of falls prevention and safety.   Other: -Provides group and verbal instruction on various topics (see comments)   Knowledge Questionnaire Score: Knowledge Questionnaire Score - 10/23/19 1435      Knowledge Questionnaire Score   Post Score  24/26       Core Components/Risk Factors/Patient Goals at Admission: Personal Goals and Risk Factors at Admission - 05/01/19 1024      Core Components/Risk Factors/Patient Goals on Admission    Weight Management  Yes;Weight Maintenance    Intervention  Weight Management: Develop a combined nutrition and exercise program designed to reach desired caloric intake, while maintaining appropriate intake of nutrient and fiber, sodium and fats, and appropriate energy expenditure required for the weight goal.;Weight Management: Provide education and appropriate resources to help participant work on and attain dietary goals.    Admit Weight  183 lb 3.2 oz (83.1 kg)    Goal Weight: Short Term  183 lb (83 kg)    Goal Weight: Long Term  183 lb (83 kg)    Expected Outcomes  Short Term: Continue to assess and modify interventions until short term weight is achieved;Long Term: Adherence to nutrition and physical activity/exercise program aimed toward attainment of established weight goal;Weight Maintenance: Understanding of the daily nutrition guidelines, which includes 25-35% calories from fat, 7% or less cal from saturated fats, less than '200mg'$  cholesterol, less than 1.5gm of sodium, & 5 or more servings of fruits and vegetables daily    Number of packs per day  1 pack/ day    Intervention  Assist the participant in steps to quit. Provide individualized education and counseling about committing to Tobacco Cessation, relapse prevention, and  pharmacological support that can be provided by physician.;Advice worker, assist with locating and accessing local/national Quit Smoking programs, and support quit date choice.    Expected Outcomes  Short Term: Will demonstrate readiness to quit, by selecting a quit date.;Short Term: Will quit all tobacco product use, adhering to prevention of relapse plan.;Long Term: Complete abstinence from all tobacco products for at least 12 months from quit date.    Lipids  Yes    Intervention  Provide education and support for participant on nutrition & aerobic/resistive exercise along with prescribed medications to achieve LDL '70mg'$ , HDL >'40mg'$ .    Expected Outcomes  Short Term: Participant states understanding of desired cholesterol values and is compliant with medications prescribed. Participant is following exercise prescription and nutrition guidelines.;Long Term: Cholesterol controlled with medications as prescribed, with individualized exercise RX and with personalized nutrition plan. Value goals: LDL < '70mg'$ , HDL > 40 mg.       Education:Diabetes - Individual verbal and written instruction to review signs/symptoms of diabetes, desired ranges of glucose level fasting, after meals and with exercise. Acknowledge that pre and post exercise glucose checks will be done for 3 sessions at entry of program.   Education: Know Your Numbers and Risk Factors: -Group verbal and written instruction about important numbers in your health.  Discussion of what are risk factors and how they  play a role in the disease process.  Review of Cholesterol, Blood Pressure, Diabetes, and BMI and the role they play in your overall health.   Core Components/Risk Factors/Patient Goals Review:  Goals and Risk Factor Review    Row Name 05/10/19 0858 06/12/19 0943 08/29/19 1141 09/29/19 1132       Core Components/Risk Factors/Patient Goals Review   Personal Goals Review  Weight Management/Obesity;Improve shortness of  breath with ADL's;Tobacco Cessation;Lipids  Weight Management/Obesity;Tobacco Cessation;Lipids;Improve shortness of breath with ADL's  Weight Management/Obesity;Tobacco Cessation;Lipids;Improve shortness of breath with ADL's  Weight Management/Obesity;Tobacco Cessation;Lipids;Improve shortness of breath with ADL's    Review  John smokes a pack a day and does not have any interest in quitting. He is managing his weight and staying around 183 pounds. His lipids are doing well and he is taking medication for it. He has labs done recently and he states it was below 74.  John continues to maintain his weight, and take all medications as perscribed to control lipid levels. He continues to smoke a pack and day and is not interested in quitting. Patient stated that shortness of breath does not limit him so much, but more leg fatigue. He does feel that his stamina is increasing and he does not have to take as many rest breaks while doing physical work as he used to. He is now able to work at a steady pace.  Wille Glaser is doing well in rehab. His weight is staying steady.  His blood pressures continue do well.  He still has some SOB, but it has improved some since he has started.  He is still smoking 1 ppd and not ready to quit currently.  Wille Glaser is doing well in rehab. His weight is up some, he is hoping its muscle.  He is running some on treadmill, but SOB still seems to be his limit.    He is currently on a statin vacation and his legs are feeling better.  He is hoping that they will either try something different or lower his dose once back on again.   Still at 1ppd and not ready to quit.    Expected Outcomes  Short: continue to exercise to improve ADL'S. Long: maintain ADL independently.  Short: continue to exercise to improve ADL'S. Long: maintain ADL independently.  Short: Continue to exercise to help with SOB.  Long: Continue to maintain exercise.  Short: Continue to exercise to help with SOB.  Long: Continue to maintain  exercise.       Core Components/Risk Factors/Patient Goals at Discharge (Final Review):  Goals and Risk Factor Review - 09/29/19 1132      Core Components/Risk Factors/Patient Goals Review   Personal Goals Review  Weight Management/Obesity;Tobacco Cessation;Lipids;Improve shortness of breath with ADL's    Review  Wille Glaser is doing well in rehab. His weight is up some, he is hoping its muscle.  He is running some on treadmill, but SOB still seems to be his limit.    He is currently on a statin vacation and his legs are feeling better.  He is hoping that they will either try something different or lower his dose once back on again.   Still at 1ppd and not ready to quit.    Expected Outcomes  Short: Continue to exercise to help with SOB.  Long: Continue to maintain exercise.       ITP Comments: ITP Comments    Row Name 05/01/19 [phone] 05/24/19 2992 05/31/19 1225 07/11/19 1447 07/19/19 4268  ITP Comments  First full day of exercise!  Patient was oriented to gym and equipment including functions, settings, policies, and procedures.  Patient's individual exercise prescription and treatment plan were reviewed.  All starting workloads were established based on the results of the 6 minute walk test done at initial orientation visit.  The plan for exercise progression was also introduced and progression will be customized based on patient's performance and goals.  30 day review completed. Continue with ITP sent to [name], Medical Director of Cardiac and Pulmonary Rehab for review , changes as needed and signature.  Wille Glaser is out of town for vacation for two weeks.  Wille Glaser has not attended since 06/28/2019.  He plans to return after New Year.  30 day review competed . ITP sent to Dr Emily Filbert for review, changes as needed and ITP approval signature   [place] Name 07/28/19 1049 08/15/19 1246 08/16/19 1229 09/13/19 0638 09/22/19 1119   ITP Comments  Wille Glaser has been out since 06/28/19 and plans to return on Monday  07/31/19.  Mr. Hickle called to inform staff that he has been around a COVID positive person. He cancelled his appointment for Monday and will keep [name] updated.  30 day review completed. ITP sent to [name], Medical Director of Cardiac and Pulmonary Rehab. Continue with ITP unless changes are made by physician.  Department operating under reduced schedule until further notice by request from hospital leadership.  30 day chart review completed. ITP sent to Dr Zachery Dakins Medical Director, for review,changes as needed and signature.  Wille Glaser is starting a 30 day Holter monitor this week   Row Name 10/11/19 0637 10/25/19 1204         ITP Comments  30 day chart review completed. ITP sent to Dr Zachery Dakins Medical Director, for review,changes as needed and signature. Continue with ITP if no changes requested  Raife graduated today from  rehab with 36 sessions completed.  Details of the patient's exercise prescription and what He needs to do in order to continue the prescription and progress were discussed with patient.  Patient was given a copy of prescription and goals.  Patient verbalized understanding.  Marquavion plans to continue to exercise by exercising at home.         Comments: Discharge ITP

## 2019-10-25 NOTE — Progress Notes (Signed)
Daily Session Note  Patient Details  Name: Brian Mcclure MRN: [id number] Date of Birth: 05-06-1956 Referring Provider:     Cardiac Rehab from 05/01/2019 in [facility] Cardiac and Pulmonary Rehab  Referring Provider  Brian Rainwater, MD      Encounter Date: 10/25/2019  Check In:      Social History   Tobacco Use  Smoking Status Current Every Day Smoker  . Packs/day: 1.00  . Years: 40.00  . Pack years: 40.00  . Types: Cigarettes  Smokeless Tobacco Never Used    Goals Met:  Independence with exercise equipment Exercise tolerated well No report of cardiac concerns or symptoms  Goals Unmet:  Not Applicable  Comments:  Brian Mcclure graduated today from  rehab with 36 sessions completed.  Details of the patient's exercise prescription and what He needs to do in order to continue the prescription and progress were discussed with patient.  Patient was given a copy of prescription and goals.  Patient verbalized understanding.  Brian Mcclure plans to continue to exercise by exercising at home.   [name] is Medical Director for [place] [name].

## 2019-11-08 ENCOUNTER — Other Ambulatory Visit: Payer: Self-pay

## 2019-11-08 ENCOUNTER — Ambulatory Visit (INDEPENDENT_AMBULATORY_CARE_PROVIDER_SITE_OTHER): Payer: No Typology Code available for payment source

## 2019-11-08 DIAGNOSIS — I236 Thrombosis of atrium, auricular appendage, and ventricle as current complications following acute myocardial infarction: Secondary | ICD-10-CM

## 2019-11-08 DIAGNOSIS — Z5181 Encounter for therapeutic drug level monitoring: Secondary | ICD-10-CM

## 2019-11-08 DIAGNOSIS — I480 Paroxysmal atrial fibrillation: Secondary | ICD-10-CM

## 2019-11-08 LAB — POCT INR: INR: 2.3 (ref 2.0–3.0)

## 2019-11-08 MED ORDER — WARFARIN SODIUM 5 MG PO TABS: ORAL_TABLET | ORAL | 0 refills | Status: DC

## 2019-11-08 NOTE — Patient Instructions (Signed)
-   continue dosage of warfarin 2.5 mg every day EXCEPT 1 whole tablet on [place]. - recheck in 4 weeks

## 2019-12-06 ENCOUNTER — Other Ambulatory Visit: Payer: Self-pay

## 2019-12-06 ENCOUNTER — Ambulatory Visit (INDEPENDENT_AMBULATORY_CARE_PROVIDER_SITE_OTHER): Payer: No Typology Code available for payment source

## 2019-12-06 DIAGNOSIS — Z5181 Encounter for therapeutic drug level monitoring: Secondary | ICD-10-CM

## 2019-12-06 DIAGNOSIS — I480 Paroxysmal atrial fibrillation: Secondary | ICD-10-CM | POA: Diagnosis not present

## 2019-12-06 DIAGNOSIS — I236 Thrombosis of atrium, auricular appendage, and ventricle as current complications following acute myocardial infarction: Secondary | ICD-10-CM

## 2019-12-06 LAB — POCT INR: INR: 3.3 — AB (ref 2.0–3.0)

## 2019-12-06 NOTE — Patient Instructions (Signed)
-   skip warfarin tonight - continue dosage of warfarin 2.5 mg every day EXCEPT 1 whole tablet on [place]. - recheck in 4 weeks

## 2019-12-24 ENCOUNTER — Encounter: Payer: Self-pay | Admitting: Emergency Medicine

## 2019-12-24 ENCOUNTER — Emergency Department: Payer: No Typology Code available for payment source

## 2019-12-24 ENCOUNTER — Other Ambulatory Visit: Payer: Self-pay

## 2019-12-24 ENCOUNTER — Emergency Department
Admission: EM | Admit: 2019-12-24 | Discharge: 2019-12-24 | Disposition: A | Discharge status: Home / Self Care | Payer: No Typology Code available for payment source | Attending: Emergency Medicine | Admitting: Emergency Medicine

## 2019-12-24 DIAGNOSIS — I5020 Unspecified systolic (congestive) heart failure: Secondary | ICD-10-CM | POA: Insufficient documentation

## 2019-12-24 DIAGNOSIS — M25551 Pain in right hip: Secondary | ICD-10-CM | POA: Insufficient documentation

## 2019-12-24 DIAGNOSIS — Z79899 Other long term (current) drug therapy: Secondary | ICD-10-CM | POA: Diagnosis not present

## 2019-12-24 DIAGNOSIS — J449 Chronic obstructive pulmonary disease, unspecified: Secondary | ICD-10-CM | POA: Insufficient documentation

## 2019-12-24 DIAGNOSIS — R110 Nausea: Secondary | ICD-10-CM | POA: Diagnosis not present

## 2019-12-24 DIAGNOSIS — Z7902 Long term (current) use of antithrombotics/antiplatelets: Secondary | ICD-10-CM | POA: Insufficient documentation

## 2019-12-24 DIAGNOSIS — Z7901 Long term (current) use of anticoagulants: Secondary | ICD-10-CM | POA: Diagnosis not present

## 2019-12-24 DIAGNOSIS — M545 Low back pain: Secondary | ICD-10-CM | POA: Diagnosis not present

## 2019-12-24 DIAGNOSIS — R109 Unspecified abdominal pain: Secondary | ICD-10-CM | POA: Diagnosis not present

## 2019-12-24 DIAGNOSIS — I2510 Atherosclerotic heart disease of native coronary artery without angina pectoris: Secondary | ICD-10-CM | POA: Insufficient documentation

## 2019-12-24 DIAGNOSIS — F17210 Nicotine dependence, cigarettes, uncomplicated: Secondary | ICD-10-CM | POA: Insufficient documentation

## 2019-12-24 LAB — URINALYSIS, COMPLETE (UACMP) WITH MICROSCOPIC
Bacteria, UA: NONE SEEN
Bilirubin Urine: NEGATIVE
Glucose, UA: NEGATIVE mg/dL
Ketones, ur: 20 mg/dL — AB
Leukocytes,Ua: NEGATIVE
Nitrite: NEGATIVE
Protein, ur: NEGATIVE mg/dL
Specific Gravity, Urine: >1.046 — ABNORMAL HIGH (ref 1.005–1.030)
Squamous Epithelial / HPF: NONE SEEN (ref 0–5)
pH: 5 (ref 5.0–8.0)

## 2019-12-24 LAB — CBC
HCT: 46.9 % (ref 39.0–52.0)
Hemoglobin: 16.4 g/dL (ref 13.0–17.0)
MCH: 31.1 pg (ref 26.0–34.0)
MCHC: 35 g/dL (ref 30.0–36.0)
MCV: 88.8 fL (ref 80.0–100.0)
Platelets: 260 10*3/uL (ref 150–400)
RBC: 5.28 MIL/uL (ref 4.22–5.81)
RDW: 13.9 % (ref 11.5–15.5)
WBC: 18.5 10*3/uL — ABNORMAL HIGH (ref 4.0–10.5)
nRBC: 0 % (ref 0.0–0.2)

## 2019-12-24 LAB — BASIC METABOLIC PANEL WITH GFR
Anion gap: 10 (ref 5–15)
BUN: 17 mg/dL (ref 8–23)
CO2: 25 mmol/L (ref 22–32)
Calcium: 9.6 mg/dL (ref 8.9–10.3)
Chloride: 103 mmol/L (ref 98–111)
Creatinine, Ser: 1.1 mg/dL (ref 0.61–1.24)
GFR calc Af Amer: >60 mL/min
GFR calc non Af Amer: >60 mL/min
Glucose, Bld: 122 mg/dL — ABNORMAL HIGH (ref 70–99)
Potassium: 4.9 mmol/L (ref 3.5–5.1)
Sodium: 138 mmol/L (ref 135–145)

## 2019-12-24 LAB — LACTIC ACID, PLASMA: Lactic Acid, Venous: 1.4 mmol/L (ref 0.5–1.9)

## 2019-12-24 MED ORDER — ONDANSETRON HCL 4 MG/2ML IJ SOLN
4.0000 mg | Freq: Once | INTRAMUSCULAR | Status: AC
  Administered 2019-12-24: 4 mg via INTRAVENOUS
  Filled 2019-12-24: qty 2

## 2019-12-24 MED ORDER — MORPHINE SULFATE (PF) 4 MG/ML IV SOLN
4.0000 mg | Freq: Once | INTRAVENOUS | Status: AC
  Administered 2019-12-24: 4 mg via INTRAVENOUS
  Filled 2019-12-24: qty 1

## 2019-12-24 MED ORDER — OXYCODONE-ACETAMINOPHEN 5-325 MG PO TABS: 1.0000 | ORAL_TABLET | ORAL | 0 refills | Status: AC | PRN

## 2019-12-24 MED ORDER — IOHEXOL 350 MG/ML SOLN
100.0000 mL | Freq: Once | INTRAVENOUS | Status: AC | PRN
  Administered 2019-12-24: 100 mL via INTRAVENOUS

## 2019-12-24 MED ORDER — SODIUM CHLORIDE 0.9 % IV BOLUS
500.0000 mL | Freq: Once | INTRAVENOUS | Status: AC
  Administered 2019-12-24: 500 mL via INTRAVENOUS

## 2019-12-24 NOTE — Discharge Instructions (Signed)
Take the pain medication as needed over the next couple days.  Follow-up with your primary care doctor within the next week.  Return to the ER for new, worsening, or persistent severe back or flank pain, numbness or weakness, difficulty walking, urinary incontinence, fever chills, vomiting, or any other new or worsening symptoms that concern you.

## 2019-12-24 NOTE — ED Provider Notes (Signed)
[facility] Emergency Department Provider Note ____________________________________________   First MD Initiated Contact with Patient 12/24/19 1140     (approximate)  I have reviewed the triage vital signs and the nursing notes.   HISTORY  Chief Complaint Flank Pain and Nausea    HPI Brian Mcclure is a 64 y.o. male with PMH as noted below including CAD, COPD, and peripheral vascular disease who presents with right-sided flank pain, acute onset around 8 AM, associated with nausea but no vomiting.  He has no urinary symptoms or diarrhea.  He denies any prior history of this pain.   Past Medical History:  Diagnosis Date  . Actinic keratosis   . Acute ST elevation myocardial infarction (STEMI) of anterior wall ([place])    a. 02/2019 s/p PCI/DES ->LAD.  Marland Kitchen Apical mural thrombus following MI ([place])    a. 02/2019 s/p Ant MI-->Echo: Small, fixed thrombus on the apical wall of LV-->coumadin.  . Barrett's esophagus   . CAD (coronary artery disease)    a. 02/2019 Ant STEMI/PCI: LM nl, LAD 100p (4x18 Resolute Onyx DES), LCX/RCA min irregs, EF 35-45%.  . Chronic back pain   . Colon polyps    a. 07/2017 Colonoscopy: 22mm sigmoid polyp, tubular adenoma, diverticulosis, int hemorrhoids.  Marland Kitchen COPD (chronic obstructive pulmonary disease) ([place])   . Degeneration of lumbar intervertebral disc   . Dysphonia   . GERD (gastroesophageal reflux disease)   . HFrEF (heart failure with reduced ejection fraction) ([place])    a. 02/2019 Echo: EF 40-45%, apical, septal, inf, and inforlateral AK. Mid and apical anterior/mid anteroseptal AK. Mildly dil LA.  Marland Kitchen Hydrocele   . Hyperlipidemia   . Ischemic cardiomyopathy    a. 02/2019 Echo: EF 40-45%.  . Myalgia   . Osteoarthritis    C-spine  . PAF (paroxysmal atrial fibrillation) (H[facility])    a. CHA2DS2VASc = 2.  . Peripheral vascular disease ([place])    a. 2018 s/p aorto-iliac stent graft.  . Reactive airway disease   . Seborrheic keratosis   .  Sinus bradycardia    a. 02/2019 Jxnl rhythm and sinus brady in setting of Ant MI-->[facility] d/c'd.  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  . Tobacco use disorder   . Tremor     Patient Active Problem List   Diagnosis Date Noted  . Encounter for therapeutic drug monitoring 03/17/2019  . Coronary artery disease involving native coronary artery of native heart with unstable angina pectoris ([place]) 03/13/2019  . Presence of drug coated stent in LAD coronary artery 03/13/2019  . Tobacco abuse 03/13/2019  . Left ventricular thrombus following MI ([place]) 03/13/2019  . Sinus bradycardia, persistent 03/13/2019  . PAF (paroxysmal atrial fibrillation) ([place])   . Acute ST elevation myocardial infarction (STEMI) of anterior wall ([place]) 03/12/2019  . SBO (small bowel obstruction) ([place]) 05/29/2018  . Columnar epithelial-lined lower esophagus   . Special screening for malignant neoplasms, colon   . Polyp of sigmoid colon   . Diverticulosis of large intestine without diverticulitis   . Hypocalcemia 02/06/2017    Past Surgical History:  Procedure Laterality Date  . [facility]  . COLONOSCOPY    . COLONOSCOPY WITH PROPOFOL N/A 08/05/2017   Procedure: COLONOSCOPY WITH PROPOFOL;  Surgeon: Virgel Manifold, MD;  Location: [facility] ENDOSCOPY;  Service: Endoscopy;  Laterality: N/A;  . CORONARY/GRAFT ACUTE MI REVASCULARIZATION N/A 03/12/2019   Procedure: Coronary/Graft Acute MI Revascularization;  Surgeon: Martinique,  Ander Slade, MD;  Location: [place] CV LAB;  Service: Cardiovascular;  Laterality: N/A;  . ESOPHAGOGASTRODUODENOSCOPY (EGD) WITH PROPOFOL N/A 08/05/2017   Procedure: ESOPHAGOGASTRODUODENOSCOPY (EGD) WITH PROPOFOL;  Surgeon: Virgel Manifold, MD;  Location: [facility] ENDOSCOPY;  Service: Endoscopy;  Laterality: N/A;  . LEFT HEART CATH AND CORONARY ANGIOGRAPHY N/A 03/12/2019   Procedure: LEFT HEART CATH AND CORONARY ANGIOGRAPHY;  Surgeon: Martinique, Peter M, MD;   Location: [place] CV LAB;  Service: Cardiovascular;  Laterality: N/A;  . NASAL SINUS SURGERY     several  . SPLENECTOMY  01/2017    Prior to Admission medications   Medication Sig Start Date End Date Taking? Authorizing Provider  Ca Carbonate-Mag Hydroxide (ROLAIDS PO) Take 1 tablet by mouth at bedtime as needed (heartburn/acid reflux).    [provider]  cholecalciferol (VITAMIN D) 25 MCG (1000 UT) tablet Take 1,000 Units by mouth 2 (two) times daily. 03/08/19   [provider]  clopidogrel (PLAVIX) 75 MG tablet Take 1 tablet (75 mg total) by mouth daily. 06/07/19   Theora Gianotti, NP  diclofenac sodium (VOLTAREN) 1 % GEL Apply 1 application topically daily as needed (pain).    [provider]  famotidine (PEPCID) 20 MG tablet Take 20 mg by mouth daily.  04/06/19   [provider]  losartan (COZAAR) 25 MG tablet Take 0.5 tablets (12.5 mg total) by mouth daily. 06/07/19 09/05/19  Theora Gianotti, NP  metoprolol succinate (TOPROL XL) 25 MG 24 hr tablet Take 0.5 tablets (12.5 mg total) by mouth daily. 05/03/19   Theora Gianotti, NP  nitroGLYCERIN (NITROSTAT) 0.4 MG SL tablet Place 1 tablet (0.4 mg total) under the tongue every 5 (five) minutes x 3 doses as needed for chest pain. 03/14/19   Bhagat, Crista Luria, PA  oxyCODONE-acetaminophen (PERCOCET) 5-325 MG tablet Take 1 tablet by mouth every 4 (four) hours as needed for up to 3 days for severe pain. 12/24/19 12/27/19  Arta Silence, MD  oxymetazoline (AFRIN) 0.05 % nasal spray Place 1 spray into both nostrils 2 (two) times daily as needed for congestion.    [provider]  rosuvastatin (CRESTOR) 40 MG tablet Take 1 tablet (40 mg total) by mouth at bedtime. 06/07/19   Theora Gianotti, NP  Simethicone (GAS-X PO) Take 1 tablet by mouth 3 (three) times daily as needed (heartburn).    [provider]  warfarin (COUMADIN) 5 MG tablet Take 1/2 tablet daily  or as directed by the anti-coag clinic. 11/08/19   Kate Sable, MD    Allergies Statins, Flunisolide, Nicotine polacrilex [nicotine], and Wellbutrin [bupropion]  Family History  Problem Relation Age of Onset  . Arrhythmia Mother   . Diabetes Brother     Social History Social History   Tobacco Use  . Smoking status: Current Every Day Smoker    Packs/day: 1.00    Years: 40.00    Pack years: 40.00    Types: Cigarettes  . Smokeless tobacco: Never Used  Substance Use Topics  . Alcohol use: Yes    Comment: rare - 1-2x/yr  . Drug use: No    Review of Systems  Constitutional: No fever/chills. Eyes: No redness. ENT: No sore throat. Cardiovascular: Denies chest pain. Respiratory: Denies shortness of breath. Gastrointestinal: Positive for nausea.  No vomiting or diarrhea. Genitourinary: Negative for dysuria or hematuria.  Musculoskeletal: Negative for back pain. Skin: Negative for rash. Neurological: Negative for headache.   ____________________________________________   PHYSICAL EXAM:  VITAL SIGNS: ED Triage  Vitals  Enc Vitals Group     BP 12/24/19 1024 121/76     Pulse Rate 12/24/19 1024 (!) 46     Resp 12/24/19 1024 16     Temp 12/24/19 1024 98.1 F (36.7 C)     Temp Source 12/24/19 1024 Oral     SpO2 12/24/19 1024 100 %     Weight 12/24/19 1024 190 lb (86.2 kg)     Height 12/24/19 1024 6\' 3"  (1.905 m)     Head Circumference --      Peak Flow --      Pain Score 12/24/19 1045 8     Pain Loc --      Pain Edu? --      Excl. in [place]? --     Constitutional: Alert and oriented.  Uncomfortable appearing but in no acute distress. Eyes: Conjunctivae are normal.  Head: Atraumatic. Nose: No congestion/rhinnorhea. Mouth/Throat: Mucous membranes are moist.   Neck: Normal range of motion.  Cardiovascular: Normal rate, regular rhythm.  Good peripheral circulation. Respiratory: Normal respiratory effort.  No retractions.  Gastrointestinal: Soft and nontender. No  distention.  Genitourinary: No CVA tenderness.  Mild right flank tenderness. Musculoskeletal: Extremities warm and well perfused.  Neurologic:  Normal speech and language. No gross focal neurologic deficits are appreciated.  Skin:  Skin is warm and dry. No rash noted. Psychiatric: Mood and affect are normal. Speech and behavior are normal.  ____________________________________________   LABS (all labs ordered are listed, but only abnormal results are displayed)  Labs Reviewed  URINALYSIS, COMPLETE (UACMP) WITH MICROSCOPIC - Abnormal; Notable for the following components:      Result Value   Color, Urine YELLOW (*)    APPearance CLEAR (*)    Specific Gravity, Urine >1.046 (*)    Hgb urine dipstick SMALL (*)    Ketones, ur 20 (*)    All other components within normal limits  BASIC METABOLIC PANEL - Abnormal; Notable for the following components:   Glucose, Bld 122 (*)    All other components within normal limits  CBC - Abnormal; Notable for the following components:   WBC 18.5 (*)    All other components within normal limits  URINE CULTURE  LACTIC ACID, PLASMA  LACTIC ACID, PLASMA   ____________________________________________  EKG  ED ECG REPORT I, Arta Silence, the attending physician, personally viewed and interpreted this ECG.  Date: 12/24/2019 EKG Time: 1427 Rate: 50 Rhythm: normal sinus rhythm QRS Axis: normal Intervals: normal ST/T Wave abnormalities: normal Narrative Interpretation: no evidence of acute ischemia  ____________________________________________  RADIOLOGY  CT abdomen renal stone study: No ureteral stone or hydronephrosis.  Thickened large and small bowel especially in the left abdomen, consistent with enteritis/colitis.  CT angio abdomen/pelvis: No acute findings.  Bowel appears normal.  ____________________________________________   PROCEDURES  Procedure(s) performed: No  Procedures  Critical Care performed:  No ____________________________________________   INITIAL IMPRESSION / ASSESSMENT AND PLAN / ED COURSE  Pertinent labs & imaging results that were available during my care of the patient were reviewed by me and considered in my medical decision making (see chart for details).  64 year old male with PMH as noted above presents with acute onset of right-sided flank pain associated with nausea but no vomiting.  He has no urinary symptoms or diarrhea.  On exam, the patient is uncomfortable appearing but in no acute distress.  The abdomen is soft and nontender.  He has mild right flank pain.  Differential includes ureteral stone, pyelonephritis,  musculoskeletal pain, colitis, diverticulitis, or possible vascular etiology.  CT renal stone study was obtained from triage and is negative for ureteral stone.  There is evidence of enteritis and possible colitis, although more on the left.  This is not really consistent with the patient's clinical presentation since he has no diarrhea, although he does have an elevated WBC count.  We will obtain a urinalysis.  If this is consistent with pyelonephritis I think this is likely consistent with the patient's symptoms.  However if it is equivocal, I will consider CT angiogram to evaluate for vascular cause.  ----------------------------------------- 4:12 PM on 12/24/2019 -----------------------------------------  Urinalysis was unremarkable.  The patient's lactic acid is normal.  I obtained a CT angio of the abdomen and pelvis.  It shows no acute vascular findings, and there are no bowel abnormalities or findings of colitis on this scan.  The patient reports improved pain although it is still present.  He states it is now really mainly localized in the right lower back and going towards the right hip.  It is a burning type of pain.  He feels that it is most likely a pinched nerve.  I agree that especially given the otherwise negative work-up, it is most  consistent with radicular pain.  The CT with contrast shows no evidence of colitis or enteritis.  I suspect that the patient's WBC count is more inflammatory and/or related to his acute pain and stress, rather than from any type of acute infection.  I performed a repeat lower extremity neurologic exam.  He has normal motor strength and intact sensation to bilateral lower extremities.  At this time there is no indication for emergent imaging of the lumbar spine.  I offered an MRI in the ED, however the patient declines.  He states he feels comfortable to go home.  He would like to follow-up with his doctor at the [place].  I think that this is reasonable.  I counseled him extensively on the results of the work-up.  I specifically informed him about the incidental nodule seen in the gallbladder.  This is not consistent with anything that would be causing his pain today, but will need follow-up.  Return precautions given, the patient expressed understanding.   ____________________________________________   FINAL CLINICAL IMPRESSION(S) / ED DIAGNOSES  Final diagnoses:  Right flank pain      NEW MEDICATIONS STARTED DURING THIS VISIT:  Discharge Medication List as of 12/24/2019  3:29 PM    START taking these medications   Details  oxyCODONE-acetaminophen (PERCOCET) 5-325 MG tablet Take 1 tablet by mouth every 4 (four) hours as needed for up to 3 days for severe pain., Starting Sun 12/24/2019, Until Wed 12/27/2019, Normal         Note:  This document was prepared using Dragon voice recognition software and may include unintentional dictation errors.    Arta Silence, MD 12/24/19 [phone]

## 2019-12-24 NOTE — ED Triage Notes (Signed)
Pt to ED via POV c/o right flank pain and nausea. Pt states that the pain started this morning around 0800. Pt states that he has not vomited but is nauseated. Pt denies hx/o kidney stones. Pt appears to be in moderate pain.

## 2019-12-25 LAB — URINE CULTURE: Culture: NO GROWTH

## 2020-01-10 ENCOUNTER — Telehealth: Payer: Self-pay | Admitting: Cardiology

## 2020-01-10 NOTE — Telephone Encounter (Signed)
Declined recall per request deleting   [facility].

## 2021-01-09 IMAGING — DX PORTABLE CHEST - 1 VIEW
1 series · 2 of 2 positions shown · non-contrast
Comparison: None

CLINICAL DATA: Chest pain.

EXAM:
PORTABLE CHEST 1 VIEW

[Series 1: chest ap · 0.14mm/px · 2 of 2 slices shown]
[im 1/2]
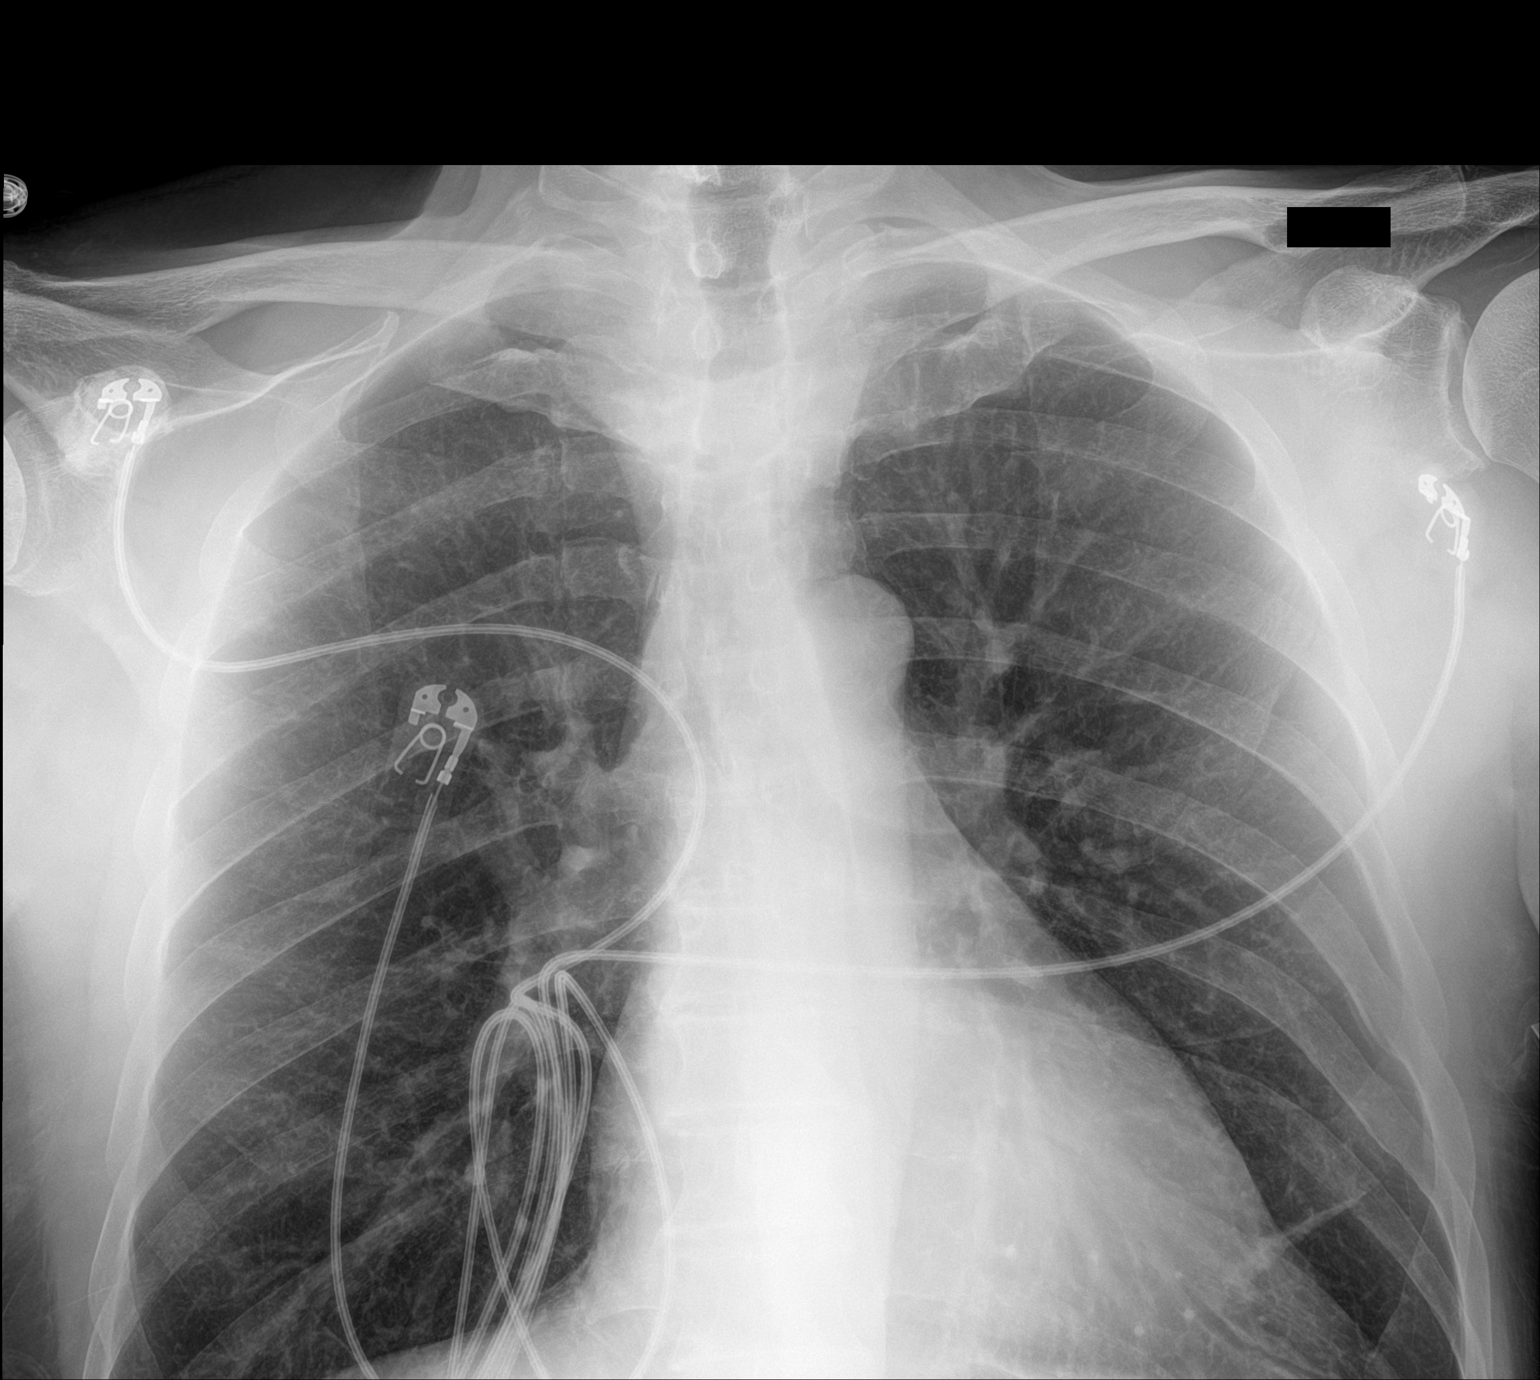
[im 2/2]
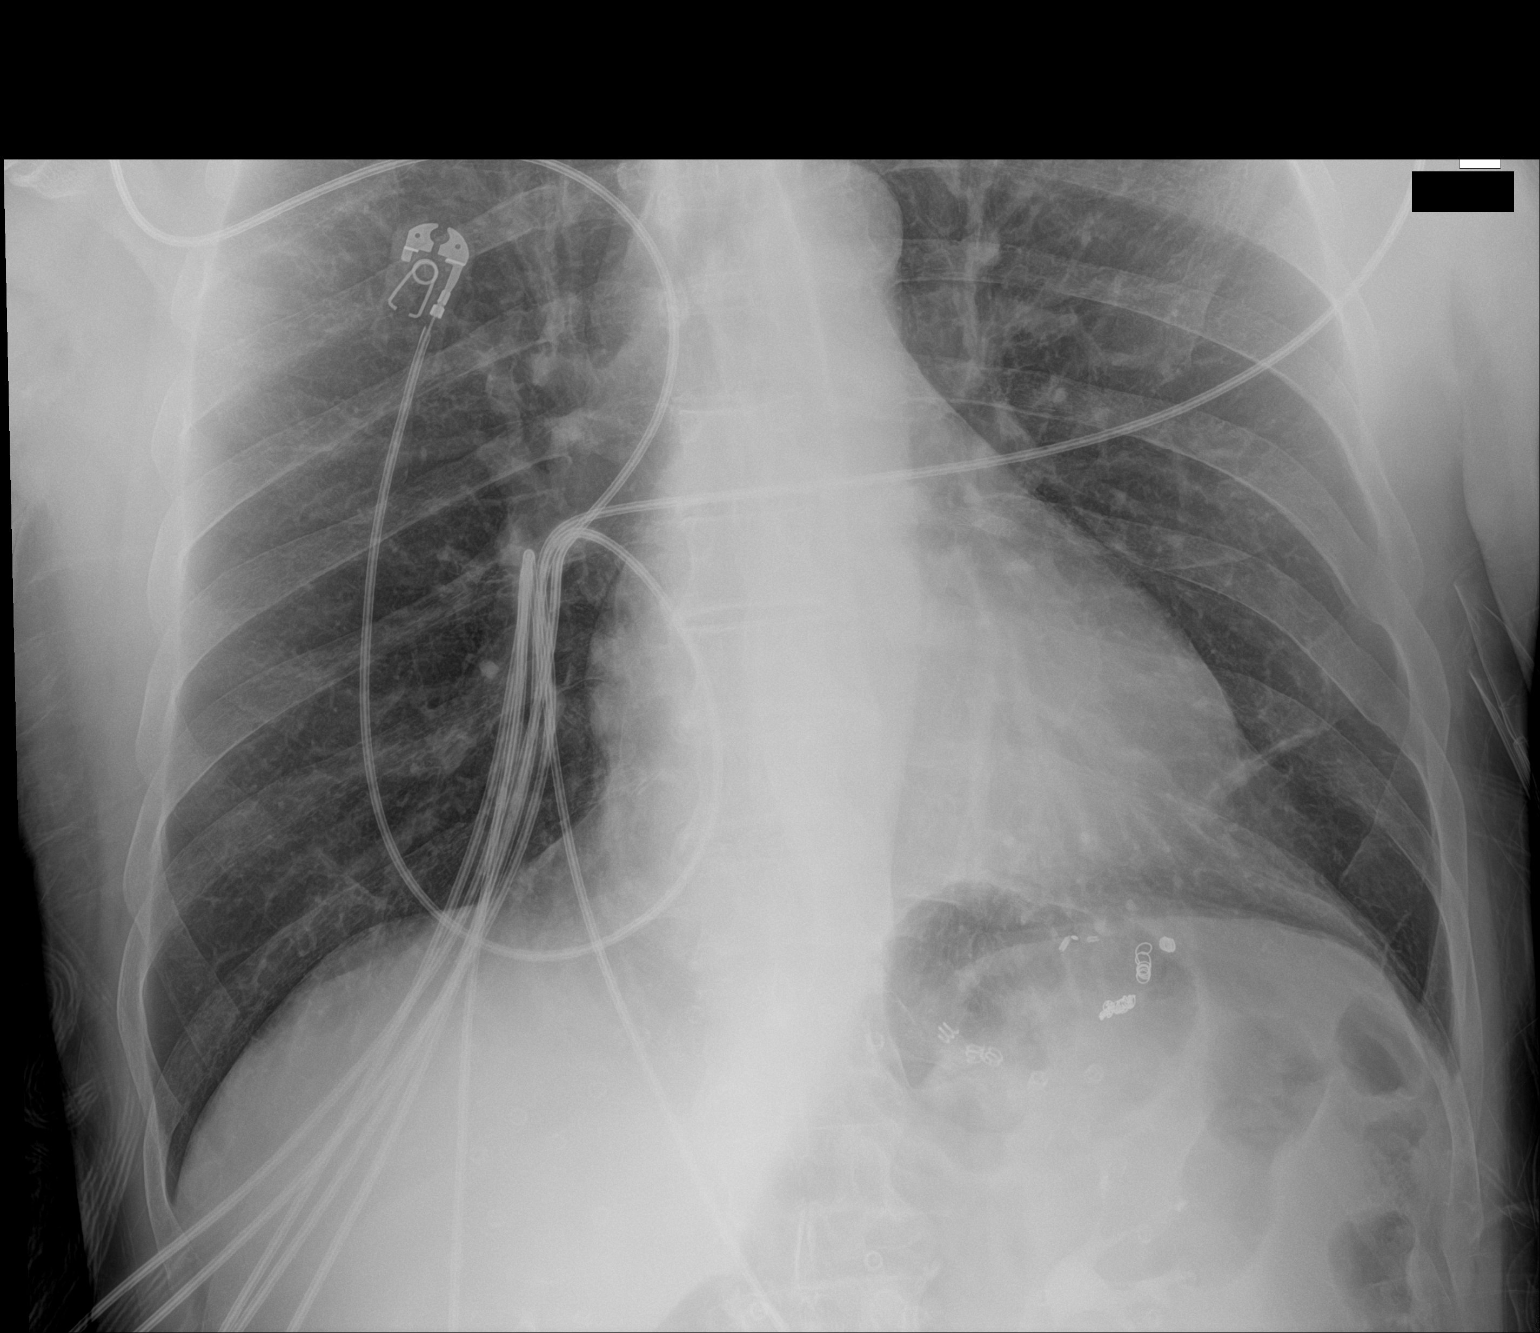

[2 of 2 positions shown; findings below may reference images not displayed]

FINDINGS: Heart size is accentuated by the portable AP technique. There are no
focal consolidations or pleural effusions. No pulmonary edema.
Surgical clips are seen in the LEFT UPPER QUADRANT of the abdomen.
Remote rib fractures.
IMPRESSION: No active disease.

## 2021-01-25 IMAGING — DX DG CHEST 1V PORT
1 series · 1 of 1 positions shown · non-contrast
Comparison: 03/12/2019

CLINICAL DATA: Chest pain

EXAM:
PORTABLE CHEST 1 VIEW

[chest ap]
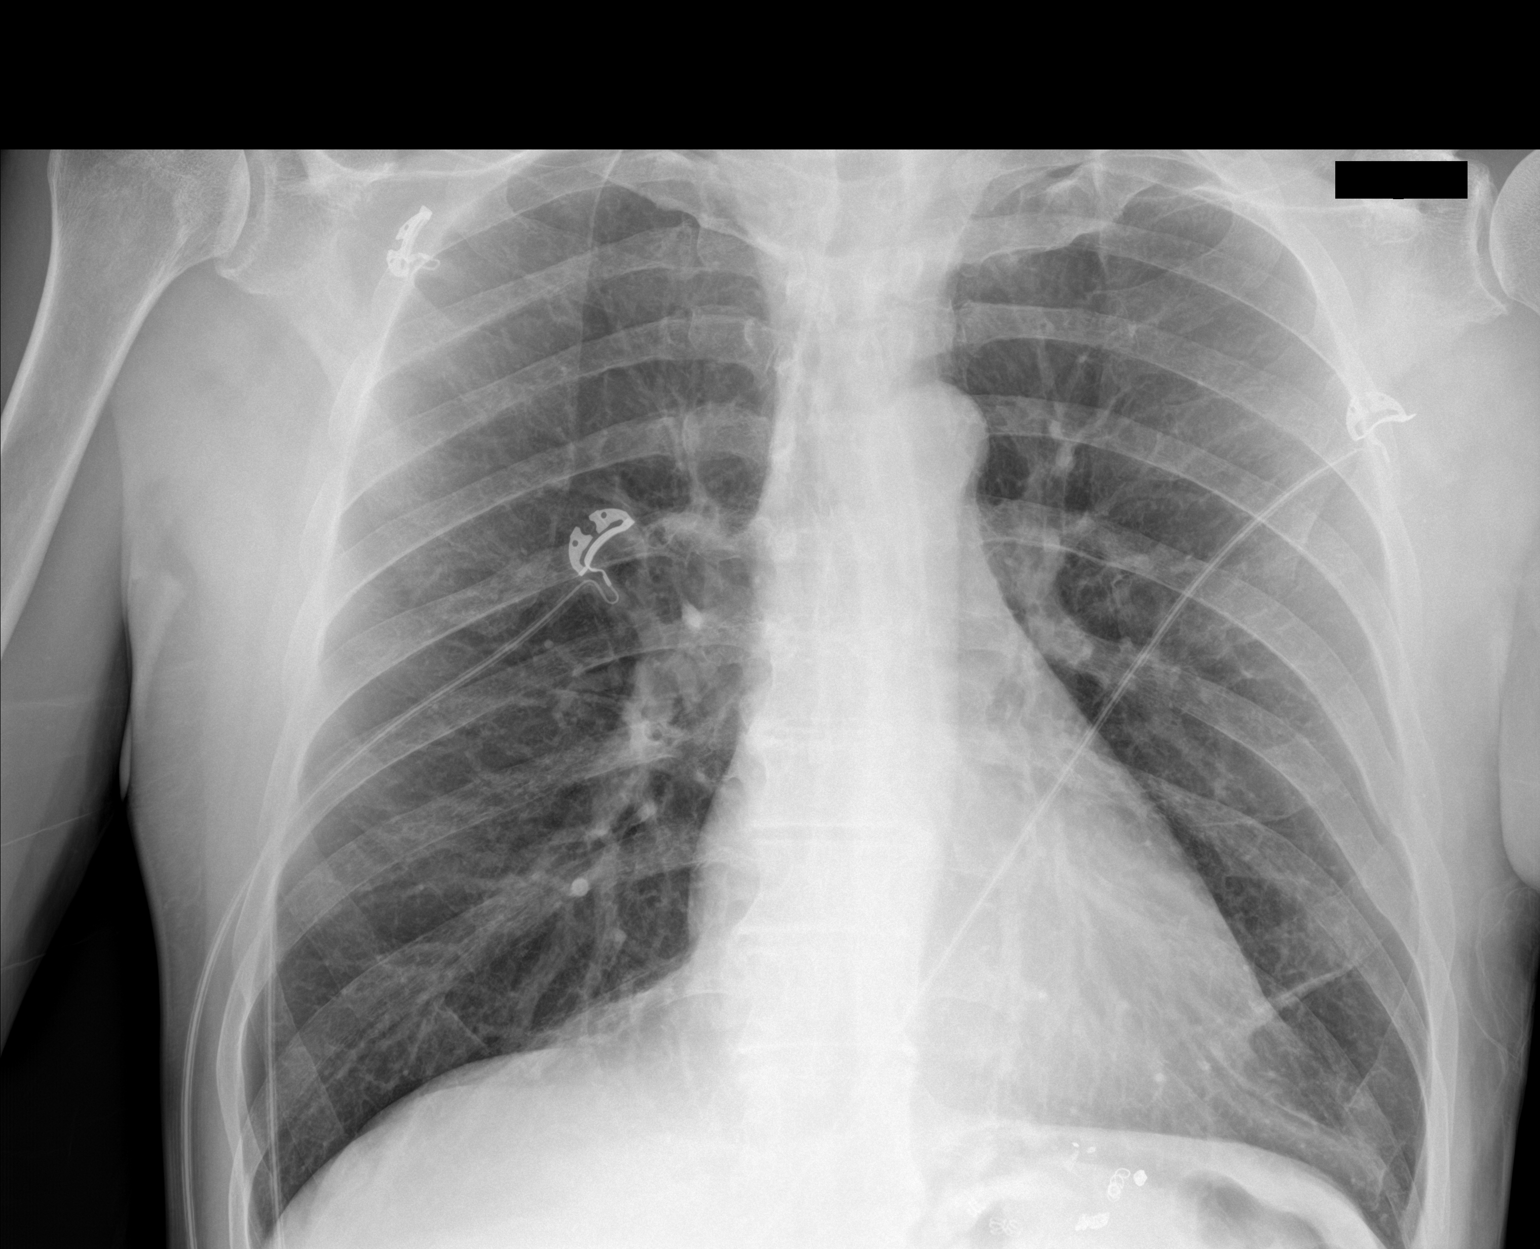

[1 of 1 positions shown; findings below may reference images not displayed]

FINDINGS: The heart size and mediastinal contours are within normal limits.
Both lungs are clear. The visualized skeletal structures are
unremarkable. Coils in the epigastric region.
IMPRESSION: No active disease.

## 2021-11-05 ENCOUNTER — Emergency Department
Admission: EM | Admit: 2021-11-05 | Discharge: 2021-11-05 | Disposition: A | Discharge status: Home / Self Care | Payer: No Typology Code available for payment source | Attending: Emergency Medicine | Admitting: Emergency Medicine

## 2021-11-05 ENCOUNTER — Emergency Department: Payer: No Typology Code available for payment source

## 2021-11-05 ENCOUNTER — Other Ambulatory Visit: Payer: Self-pay

## 2021-11-05 ENCOUNTER — Encounter: Payer: Self-pay | Admitting: Emergency Medicine

## 2021-11-05 DIAGNOSIS — I2510 Atherosclerotic heart disease of native coronary artery without angina pectoris: Secondary | ICD-10-CM | POA: Diagnosis not present

## 2021-11-05 DIAGNOSIS — R079 Chest pain, unspecified: Secondary | ICD-10-CM | POA: Diagnosis present

## 2021-11-05 DIAGNOSIS — M25512 Pain in left shoulder: Secondary | ICD-10-CM | POA: Insufficient documentation

## 2021-11-05 DIAGNOSIS — R202 Paresthesia of skin: Secondary | ICD-10-CM | POA: Insufficient documentation

## 2021-11-05 DIAGNOSIS — R0789 Other chest pain: Secondary | ICD-10-CM | POA: Insufficient documentation

## 2021-11-05 DIAGNOSIS — Z7901 Long term (current) use of anticoagulants: Secondary | ICD-10-CM | POA: Insufficient documentation

## 2021-11-05 LAB — BASIC METABOLIC PANEL
Anion gap: 6 (ref 5–15)
BUN: 12 mg/dL (ref 8–23)
CO2: 28 mmol/L (ref 22–32)
Calcium: 9 mg/dL (ref 8.9–10.3)
Chloride: 106 mmol/L (ref 98–111)
Creatinine, Ser: 1.13 mg/dL (ref 0.61–1.24)
GFR, Estimated: >60 mL/min (ref >60)
Glucose, Bld: 110 mg/dL — ABNORMAL HIGH (ref 70–99)
Potassium: 3.6 mmol/L (ref 3.5–5.1)
Sodium: 140 mmol/L (ref 135–145)

## 2021-11-05 LAB — CBC
HCT: 42.4 % (ref 39.0–52.0)
Hemoglobin: 13.7 g/dL (ref 13.0–17.0)
MCH: 30.4 pg (ref 26.0–34.0)
MCHC: 32.3 g/dL (ref 30.0–36.0)
MCV: 94.2 fL (ref 80.0–100.0)
Platelets: 215 10*3/uL (ref 150–400)
RBC: 4.5 MIL/uL (ref 4.22–5.81)
RDW: 14.8 % (ref 11.5–15.5)
WBC: 11.5 10*3/uL — ABNORMAL HIGH (ref 4.0–10.5)
nRBC: 0 % (ref 0.0–0.2)

## 2021-11-05 LAB — TROPONIN I (HIGH SENSITIVITY)
Troponin I (High Sensitivity): 7 ng/L (ref <18)
Troponin I (High Sensitivity): 6 ng/L (ref <18)

## 2021-11-05 LAB — PROTIME-INR
INR: 1.1 (ref 0.8–1.2)
Prothrombin Time: 14.4 seconds (ref 11.4–15.2)

## 2021-11-05 NOTE — ED Triage Notes (Signed)
Pt comes into the ED via POV c/o left side chest pain with radiation into the left arm.  Pt states the pain started 45 minutes.  Pt admits to having a STEMI in 2020.  Pt admits to having lightheadedness and tingling in that left arm.  Pt presents with even and unlabored respirations at this time.  ?

## 2021-11-05 NOTE — ED Provider Notes (Signed)
? ?[facility] ?Provider Note ? ? ? Event Date/Time  ? First MD Initiated Contact with Patient 11/05/21 2026   ?  (approximate) ? ? ?History  ? ?Chest Pain ? ? ?HPI ? ?Brian Mcclure is a 66 y.o. male who presents to the ED for evaluation of Chest Pain ?  ?I reviewed [facility] cardiology visit from 2020.  History of CAD s/p STEMI in August 2020.  LV thrombus on anticoagulation, A-fib paroxysmal he and PAD s/p aortoiliac stents.  Currently on Eliquis. ? ?Patient presents to the ED for evaluation of left arm tingling in the setting of chronic intermittent chest pain.  He reports chronic fleeting chest pain that lasts a matter of seconds but has been present for years and affects him every day.  Reports this is at its baseline, but after awakening from a nap today he felt tingling and soreness to his left shoulder radiating down his left posterior arm.  This arm aspect was in all the future, so due to this he presents to the ED to get checked out. ? ?He reports he did some heavy work outside with his left arm and questions if this could be musculoskeletal. ? ? ?Physical Exam  ? ?Triage Vital Signs: ?ED Triage Vitals  ?Enc Vitals Group  ?   BP 11/05/21 1731 105/70  ?   Pulse Rate 11/05/21 1731 (!) 52  ?   Resp 11/05/21 1731 17  ?   Temp 11/05/21 1731 99.1 ?F (37.3 ?C)  ?   Temp Source 11/05/21 1731 Oral  ?   SpO2 11/05/21 1731 97 %  ?   Weight 11/05/21 1732 182 lb (82.6 kg)  ?   Height 11/05/21 1732 '6\' 3"'$  (1.905 m)  ?   Head Circumference --   ?   Peak Flow --   ?   Pain Score 11/05/21 1732 3  ?   Pain Loc --   ?   Pain Edu? --   ?   Excl. in [place]? --   ? ? ?Most recent vital signs: ?Vitals:  ? 11/05/21 1948 11/05/21 2104  ?BP:  102/61  ?Pulse:  (!) 51  ?Resp: 18 17  ?Temp:    ?SpO2:  99%  ? ? ?General: Awake, no distress.  Well-appearing and conversational. ?CV:  Good peripheral perfusion.  ?Resp:  Normal effort.  ?Abd:  No distention.  ?MSK:  No deformity noted.  Left trapezius tenderness without  overlying skin changes or signs of trauma.  Chest pain is also reproducible to palpation. ?Neuro:  No focal deficits appreciated. ?Other:   ? ? ?ED Results / Procedures / Treatments  ? ?Labs ?(all labs ordered are listed, but only abnormal results are displayed) ?Labs Reviewed  ?BASIC METABOLIC PANEL - Abnormal; Notable for the following components:  ?    Result Value  ? Glucose, Bld 110 (*)   ? All other components within normal limits  ?CBC - Abnormal; Notable for the following components:  ? WBC 11.5 (*)   ? All other components within normal limits  ?PROTIME-INR  ?TROPONIN I (HIGH SENSITIVITY)  ?TROPONIN I (HIGH SENSITIVITY)  ? ? ?EKG ?Sinus bradycardia the rate of 54 bpm.  Normal axis and intervals.  No evidence of acute ischemia. ? ?RADIOLOGY ?CXR reviewed by me without evidence of acute cardiopulmonary pathology. ? ?Official radiology report(s): ?DG Chest 2 View ? ?Result Date: 11/05/2021 ?CLINICAL DATA:  Left-sided chest pain radiating to left arm, lightheadedness EXAM: CHEST - 2 VIEW  COMPARISON:  03/28/2019 FINDINGS: Frontal and lateral views of the chest demonstrate an unremarkable cardiac silhouette. No airspace disease, effusion, or pneumothorax. Stable embolic coils of gastroesophageal junction. Evidence of prior ventral hernia repair. No acute bony abnormalities. IMPRESSION: 1. No acute intrathoracic process. Electronically Signed   By: Randa Ngo M.D.   On: 11/05/2021 17:54   ? ?PROCEDURES and INTERVENTIONS: ? ?.1-3 Lead EKG Interpretation ?Performed by: Vladimir Crofts, MD ?Authorized by: Vladimir Crofts, MD  ? ?  Interpretation: normal   ?  ECG rate:  51 ?  ECG rate assessment: bradycardic   ?  Rhythm: sinus bradycardia   ?  Ectopy: none   ?  Conduction: normal   ? ?Medications - No data to display ? ? ?IMPRESSION / MDM / ASSESSMENT AND PLAN / ED COURSE  ?I reviewed the triage vital signs and the nursing notes. ? ?Pleasant 66 year old male with a history of CAD presents to the ED with atypical chest  pains without evidence of ACS and suitable for outpatient management.  He looks systemically well without evidence of neurologic or vascular deficits.  Has some trapezius tenderness on the left and this reproduces his symptoms.  No overlying skin changes or signs of herpes zoster or trauma.  His work-up is benign without evidence of ACS.  2 negative troponins and a nonischemic EKG.  Normal metabolic panel and CBC with marginal and nonspecific leukocytosis.  I offered him an observation admission but he declines.  We discussed return precautions and follow-up closely with his cardiologist. ? ?  ? ? ?FINAL CLINICAL IMPRESSION(S) / ED DIAGNOSES  ? ?Final diagnoses:  ?Other chest pain  ? ? ? ?Rx / DC Orders  ? ?ED Discharge Orders   ? ? None  ? ?  ? ? ? ?Note:  This document was prepared using Dragon voice recognition software and may include unintentional dictation errors. ?  ?Vladimir Crofts, MD ?11/05/21 2114 ? ?

## 2022-02-25 ENCOUNTER — Ambulatory Visit
Admission: RE | Admit: 2022-02-25 | Discharge: 2022-02-25 | Disposition: A | Discharge status: Home / Self Care | Payer: No Typology Code available for payment source | Source: Ambulatory Visit | Attending: Family Medicine | Admitting: Family Medicine

## 2022-02-25 DIAGNOSIS — R439 Unspecified disturbances of smell and taste: Secondary | ICD-10-CM | POA: Diagnosis present

## 2022-02-25 DIAGNOSIS — Z20822 Contact with and (suspected) exposure to covid-19: Secondary | ICD-10-CM | POA: Insufficient documentation

## 2022-02-25 DIAGNOSIS — H9201 Otalgia, right ear: Secondary | ICD-10-CM | POA: Diagnosis not present

## 2022-02-25 LAB — SARS CORONAVIRUS 2 BY RT PCR: SARS Coronavirus 2 by RT PCR: NEGATIVE

## 2022-02-25 NOTE — ED Provider Notes (Signed)
[place]   [id number] 02/25/22 Arrival Time: 4174  ASSESSMENT & PLAN:  1. Otalgia of right ear   2. Decreased taste and smell    Likely seasonal allergies; with h/o. Not terribly congested. Feels well. Doubt viral illness. COVID testing sent at pt request.. OTC symptom care as needed.  Hearing and R otalgia has dramatically improved after ear wax removal. Normal ear exam s/p removal.  Reviewed expectations re: course of current medical issues. Questions answered. Outlined signs and symptoms indicating need for more acute intervention. Understanding verbalized. After Visit Summary given.   SUBJECTIVE: History from: Patient. Brian Mcclure is a 66 y.o. male. Reports: R ear otalgia/"fullness"; x sev weeks; decreased hearing. H/O allergies; recently sneezing more than usual. Denies: fever and difficulty breathing. Normal PO intake without n/v/d. PCP recommended COVID testing.  OBJECTIVE: General appearance: alert; no distress Eyes: PERRLA; EOMI; conjunctiva normal HENT: [place]; AT; with mild nasal congestion; R EAC with cerumen impaction Neck: supple  Lungs: speaks full sentences without difficulty; unlabored Extremities: no edema Skin: warm and dry Neurologic: normal gait Psychological: alert and cooperative; normal mood and affect  Labs:  Labs Reviewed  SARS CORONAVIRUS 2 BY RT PCR    Allergies  Allergen Reactions   Statins Other (See Comments)    Leg Cramps   Flunisolide Other (See Comments)    didn't work   Nicotine Polacrilex [Nicotine] Other (See Comments)    didn't help stop smoking   Wellbutrin [Bupropion] Other (See Comments)    Caused somnolence (and didn't help to stop smoking)    Past Medical History:  Diagnosis Date   Actinic keratosis    Acute ST elevation myocardial infarction (STEMI) of anterior wall ([place])    a. 02/2019 s/p PCI/DES ->LAD.   Apical mural thrombus following MI ([place])    a. 02/2019 s/p Ant MI-->Echo: Small, fixed  thrombus on the apical wall of LV-->coumadin.   Barrett's esophagus    CAD (coronary artery disease)    a. 02/2019 Ant STEMI/PCI: LM nl, LAD 100p (4x18 Resolute Onyx DES), LCX/RCA min irregs, EF 35-45%.   Chronic back pain    Colon polyps    a. 07/2017 Colonoscopy: 26m sigmoid polyp, tubular adenoma, diverticulosis, int hemorrhoids.   COPD (chronic obstructive pulmonary disease) (HCC)    Degeneration of lumbar intervertebral disc    Dysphonia    GERD (gastroesophageal reflux disease)    HFrEF (heart failure with reduced ejection fraction) ([facility]    a. 02/2019 Echo: EF 40-45%, apical, septal, inf, and inforlateral AK. Mid and apical anterior/mid anteroseptal AK. Mildly dil LA.   Hydrocele    Hyperlipidemia    Ischemic cardiomyopathy    a. 02/2019 Echo: EF 40-45%.   Myalgia    Osteoarthritis    C-spine   PAF (paroxysmal atrial fibrillation) (HCC)    a. CHA2DS2VASc = 2.   [place]ipheral vascular disease ([place]    a. 2018 s/p aorto-iliac stent graft.   Reactive airway disease    Seborrheic keratosis    Sinus bradycardia    a. 02/2019 Jxnl rhythm and sinus brady in setting of Ant MI-->[facility] d/c'd.   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   Tobacco use disorder    Tremor    Social History   Socioeconomic History   Marital status: Married    Spouse name: Not on file   Number of children: Not on file   Years of education: Not on file   Highest education level:  Not on file  Occupational History   Not on file  Tobacco Use   Smoking status: Every Day    Packs/day: 1.00    Years: 40.00    Total pack years: 40.00    Types: Cigarettes   Smokeless tobacco: Never  Vaping Use   Vaping Use: Never used  Substance and Sexual Activity   Alcohol use: Yes    Comment: rare - 1-2x/yr   Drug use: No   Sexual activity: Yes  Other Topics Concern   Not on file  Social History Narrative   Not on file   Social Determinants of Health   Financial Resource Strain: Low Risk   (05/29/2018)   Overall Financial Resource Strain (CARDIA)    Difficulty of Paying Living Expenses: Not hard at all  Food Insecurity: No Food Insecurity (05/29/2018)   Hunger Vital Sign    Worried About Running Out of Food in the Last Year: Never true    Ran Out of Food in the Last Year: Never true  Transportation Needs: No Transportation Needs (05/29/2018)   PRAPARE - Hydrologist (Medical): No    Lack of Transportation (Non-Medical): No  Physical Activity: Sufficiently Active (05/29/2018)   Exercise Vital Sign    Days of Exercise per Week: 7 days    Minutes of Exercise per Session: 120 min  Stress: No Stress Concern Present (05/29/2018)   [place]    Feeling of Stress : Not at all  Social Connections: Unknown (05/29/2018)   Social Connection and Isolation Panel [NHANES]    Frequency of Communication with Friends and Family: More than three times a week    Frequency of Social Gatherings with Friends and Family: More than three times a week    Attends Religious Services: Patient refused    Active Member of Clubs or Organizations: Patient refused    Attends Archivist Meetings: Patient refused    Marital Status: Married  Human resources officer Violence: Not At Risk (05/29/2018)   Humiliation, Afraid, Rape, and Kick questionnaire    Fear of Current or Ex-Partner: No    Emotionally Abused: No    Physically Abused: No    Sexually Abused: No   Family History  Problem Relation Age of Onset   Arrhythmia Mother    Diabetes Brother    Past Surgical History:  Procedure Laterality Date   [facility]    [facility]   [facility]     COLONOSCOPY WITH PROPOFOL N/A 08/05/2017   Procedure: COLONOSCOPY WITH PROPOFOL;  Surgeon: Virgel Manifold, MD;  Location: [facility] ENDOSCOPY;  Service: Endoscopy;  Laterality: N/A;   CORONARY/GRAFT ACUTE MI REVASCULARIZATION N/A 03/12/2019    Procedure: Coronary/Graft Acute MI Revascularization;  Surgeon: Martinique, Peter M, MD;  Location: [place] [facility];  Service: Cardiovascular;  Laterality: N/A;   ESOPHAGOGASTRODUODENOSCOPY (EGD) WITH PROPOFOL N/A 08/05/2017   Procedure: ESOPHAGOGASTRODUODENOSCOPY (EGD) WITH PROPOFOL;  Surgeon: Virgel Manifold, MD;  Location: [facility] ENDOSCOPY;  Service: Endoscopy;  Laterality: N/A;   LEFT HEART CATH AND CORONARY ANGIOGRAPHY N/A 03/12/2019   Procedure: LEFT HEART CATH AND CORONARY ANGIOGRAPHY;  Surgeon: Martinique, Peter M, MD;  Location: [place] [facility];  Service: Cardiovascular;  Laterality: N/A;   NASAL SINUS SURGERY     several   SPLENECTOMY  01/2017     Vanessa Kick, MD 02/25/22 1050

## 2022-02-25 NOTE — ED Triage Notes (Signed)
Pt presents with right ear fullness. Denies any pain.   [facility] in conjunction with Eustachian blockage and taste/smell not 100%. Right ear popping and fluid sounds, muffled hearing, drainage, thick feeling in mouth/throat. No other symptoms. - Entered by patient

## 2022-09-18 ENCOUNTER — Ambulatory Visit: Payer: No Typology Code available for payment source | Admitting: Internal Medicine

## 2022-09-28 ENCOUNTER — Other Ambulatory Visit: Payer: Self-pay

## 2022-09-28 ENCOUNTER — Encounter: Payer: No Typology Code available for payment source | Attending: Registered Nurse

## 2022-09-28 DIAGNOSIS — R0609 Other forms of dyspnea: Secondary | ICD-10-CM

## 2022-09-28 NOTE — Progress Notes (Signed)
Virtual Visit completed. Patient informed on EP and RD appointment and 6 Minute walk test. Patient also informed of patient health questionnaires on My Chart. Patient Verbalizes understanding. Visit diagnosis can be found in [facility] media,patient is [place].

## 2022-09-30 VITALS — Ht 74.25 in | Wt 176.9 lb

## 2022-09-30 DIAGNOSIS — R0609 Other forms of dyspnea: Secondary | ICD-10-CM | POA: Diagnosis present

## 2022-09-30 NOTE — Patient Instructions (Signed)
Patient Instructions  Patient Details  Name: Brian Mcclure MRN: [id number] Date of Birth: March 05, 1956 Referring Provider:  [place] are your personal goals for exercise, nutrition, and risk factors. Our goal is to help you stay on track towards obtaining and maintaining these goals. We will be discussing your progress on these goals with you throughout the program.  Initial Exercise Prescription:  Initial Exercise Prescription - 09/30/22 1500       Date of Initial Exercise RX and Referring Provider   Date 09/30/22    Referring Provider Bobbye Charleston MD      Oxygen   Maintain Oxygen Saturation 88% or higher      Treadmill   MPH 3    Grade 1    Minutes 15    METs 3.71      Recumbant Bike   Level 3    Watts 57    Minutes 15    METs 4.24      NuStep   Level 4    Minutes 15    METs 4.24      Prescription Details   Frequency (times per week) 2    Duration Progress to 30 minutes of continuous aerobic without signs/symptoms of physical distress      Intensity   THRR 40-80% of Max Heartrate 92-133    Ratings of Perceived Exertion 11-13    Perceived Dyspnea 0-4      Progression   Progression Continue to progress workloads to maintain intensity without signs/symptoms of physical distress.      Resistance Training   Training Prescription Yes    Weight 4 lb    Reps 10-15             Exercise Goals: Frequency: Be able to perform aerobic exercise two to three times per week in program working toward 2-5 days per week of home exercise.  Intensity: Work with a perceived exertion of 11 (fairly light) - 15 (hard) while following your exercise prescription.  We will make changes to your prescription with you as you progress through the program.   Duration: Be able to do 30 to 45 minutes of continuous aerobic exercise in addition to a 5 minute warm-up and a 5 minute cool-down routine.   Nutrition Goals: Your personal nutrition goals will be  established when you do your nutrition analysis with the dietician.  The following are general nutrition guidelines to follow: Cholesterol < '200mg'$ /day Sodium < '1500mg'$ /day Fiber: Men over 50 yrs - 30 grams per day  Personal Goals:  Personal Goals and Risk Factors at Admission - 09/28/22 1118       Core Components/Risk Factors/Patient Goals on Admission    Weight Management Yes;Weight Maintenance;Weight Gain    Intervention Weight Management: Develop a combined nutrition and exercise program designed to reach desired caloric intake, while maintaining appropriate intake of nutrient and fiber, sodium and fats, and appropriate energy expenditure required for the weight goal.;Weight Management: Provide education and appropriate resources to help participant work on and attain dietary goals.;Weight Management/Obesity: Establish reasonable short term and long term weight goals.    Expected Outcomes Short Term: Continue to assess and modify interventions until short term weight is achieved;Long Term: Adherence to nutrition and physical activity/exercise program aimed toward attainment of established weight goal;Weight Maintenance: Understanding of the daily nutrition guidelines, which includes 25-35% calories from fat, 7% or less cal from saturated fats, less than '200mg'$  cholesterol, less than 1.5gm of sodium, & 5 or more  servings of fruits and vegetables daily;Understanding recommendations for meals to include 15-35% energy as protein, 25-35% energy from fat, 35-60% energy from carbohydrates, less than '200mg'$  of dietary cholesterol, 20-35 gm of total fiber daily;Understanding of distribution of calorie intake throughout the day with the consumption of 4-5 meals/snacks;Weight Gain: Understanding of general recommendations for a high calorie, high protein meal plan that promotes weight gain by distributing calorie intake throughout the day with the consumption for 4-5 meals, snacks, and/or supplements    Tobacco  Cessation Yes    Intervention Assist the participant in steps to quit. Provide individualized education and counseling about committing to Tobacco Cessation, relapse prevention, and pharmacological support that can be provided by physician.;Advice worker, assist with locating and accessing local/national Quit Smoking programs, and support quit date choice.    Expected Outcomes Short Term: Will demonstrate readiness to quit, by selecting a quit date.;Short Term: Will quit all tobacco product use, adhering to prevention of relapse plan.;Long Term: Complete abstinence from all tobacco products for at least 12 months from quit date.    Improve shortness of breath with ADL's Yes    Intervention Provide education, individualized exercise plan and daily activity instruction to help decrease symptoms of SOB with activities of daily living.    Expected Outcomes Short Term: Improve cardiorespiratory fitness to achieve a reduction of symptoms when performing ADLs;Long Term: Be able to perform more ADLs without symptoms or delay the onset of symptoms    Hypertension Yes    Intervention Provide education on lifestyle modifcations including regular physical activity/exercise, weight management, moderate sodium restriction and increased consumption of fresh fruit, vegetables, and low fat dairy, alcohol moderation, and smoking cessation.;Monitor prescription use compliance.    Expected Outcomes Short Term: Continued assessment and intervention until BP is < 140/49m HG in hypertensive participants. < 130/848mHG in hypertensive participants with diabetes, heart failure or chronic kidney disease.;Long Term: Maintenance of blood pressure at goal levels.    Lipids Yes    Intervention Provide education and support for participant on nutrition & aerobic/resistive exercise along with prescribed medications to achieve LDL '70mg'$ , HDL >'40mg'$ .    Expected Outcomes Short Term: Participant states understanding of  desired cholesterol values and is compliant with medications prescribed. Participant is following exercise prescription and nutrition guidelines.;Long Term: Cholesterol controlled with medications as prescribed, with individualized exercise RX and with personalized nutrition plan. Value goals: LDL < '70mg'$ , HDL > 40 mg.             Tobacco Use Initial Evaluation: Social History   Tobacco Use  Smoking Status Every Day   Packs/day: 1.00   Years: 40.00   Total pack years: 40.00   Types: Cigarettes  Smokeless Tobacco Never    Exercise Goals and Review:  Exercise Goals     Row Name 09/30/22 1541             Exercise Goals   Increase Physical Activity Yes       Intervention Provide advice, education, support and counseling about physical activity/exercise needs.;Develop an individualized exercise prescription for aerobic and resistive training based on initial evaluation findings, risk stratification, comorbidities and participant's personal goals.       Expected Outcomes Short Term: Attend rehab on a regular basis to increase amount of physical activity.;Long Term: Add in home exercise to make exercise part of routine and to increase amount of physical activity.;Long Term: Exercising regularly at least 3-5 days a week.       Increase Strength  and Stamina Yes       Intervention Provide advice, education, support and counseling about physical activity/exercise needs.;Develop an individualized exercise prescription for aerobic and resistive training based on initial evaluation findings, risk stratification, comorbidities and participant's personal goals.       Expected Outcomes Short Term: Increase workloads from initial exercise prescription for resistance, speed, and METs.;Short Term: Perform resistance training exercises routinely during rehab and add in resistance training at home;Long Term: Improve cardiorespiratory fitness, muscular endurance and strength as measured by increased METs  and functional capacity (6MWT)       Able to understand and use rate of perceived exertion (RPE) scale Yes       Intervention Provide education and explanation on how to use RPE scale       Expected Outcomes Short Term: Able to use RPE daily in rehab to express subjective intensity level;Long Term:  Able to use RPE to guide intensity level when exercising independently       Able to understand and use Dyspnea scale Yes       Intervention Provide education and explanation on how to use Dyspnea scale       Expected Outcomes Short Term: Able to use Dyspnea scale daily in rehab to express subjective sense of shortness of breath during exertion;Long Term: Able to use Dyspnea scale to guide intensity level when exercising independently       Knowledge and understanding of Target Heart Rate Range (THRR) Yes       Intervention Provide education and explanation of THRR including how the numbers were predicted and where they are located for reference       Expected Outcomes Short Term: Able to state/look up THRR;Short Term: Able to use daily as guideline for intensity in rehab;Long Term: Able to use THRR to govern intensity when exercising independently       Able to check pulse independently Yes       Intervention Provide education and demonstration on how to check pulse in carotid and radial arteries.;Review the importance of being able to check your own pulse for safety during independent exercise       Expected Outcomes Short Term: Able to explain why pulse checking is important during independent exercise;Long Term: Able to check pulse independently and accurately       Understanding of Exercise Prescription Yes       Intervention Provide education, explanation, and written materials on patient's individual exercise prescription       Expected Outcomes Short Term: Able to explain program exercise prescription;Long Term: Able to explain home exercise prescription to exercise independently

## 2022-09-30 NOTE — Progress Notes (Signed)
Pulmonary Individual Treatment Plan  Patient Details  Name: Brian Mcclure MRN: [id number] Date of Birth: Nov 27, 1955 Referring Provider:   Flowsheet Row Pulmonary Rehab from 09/30/2022 in [facility] Cardiac and Pulmonary Rehab  Referring Provider Bobbye Charleston MD       Initial Encounter Date:  Flowsheet Row Pulmonary Rehab from 09/30/2022 in [facility] Cardiac and Pulmonary Rehab  Date 09/30/22       Visit Diagnosis: Dyspnea on exertion  Patient's Home Medications on Admission:  Current Outpatient Medications:    albuterol (VENTOLIN HFA) 108 (90 Base) MCG/ACT inhaler, INHALE 2 PUFFS BY ORAL INHALATION EVERY 4 TO 6 HOURS AS NEEDED FOR CHRONIC OBSTRUCTIVE LUNG DISEASE BE SURE TO [place] MOUTHPIECE WITH WARM WATER ONCE A WEEK, Disp: , Rfl:    apixaban (ELIQUIS) 5 MG TABS tablet, TAKE ONE TABLET BY MOUTH EVERY 12 HOURS CAUTION BLOOD THINNER, Disp: , Rfl:    aspirin 81 MG chewable tablet, CHEW ONE TABLET BY MOUTH EVERY DAY, Disp: , Rfl:    Ca Carbonate-Mag Hydroxide (ROLAIDS PO), Take 1 tablet by mouth at bedtime as needed (heartburn/acid reflux)., Disp: , Rfl:    cholecalciferol (VITAMIN D) 25 MCG (1000 UT) tablet, Take 1,000 Units by mouth 2 (two) times daily., Disp: , Rfl:    clopidogrel (PLAVIX) 75 MG tablet, Take 1 tablet (75 mg total) by mouth daily., Disp: 30 tablet, Rfl: 1   diclofenac sodium (VOLTAREN) 1 % GEL, Apply 1 application topically daily as needed (pain). (Patient not taking: Reported on 09/28/2022), Disp: , Rfl:    diclofenac Sodium (VOLTAREN) 1 % GEL, APPLY 4 GRAMS TOPICALLY FOUR TIMES A DAY AS NEEDED FOR ARTHRITIC PAIN, Disp: , Rfl:    famotidine (PEPCID) 20 MG tablet, Take 20 mg by mouth daily.  (Patient not taking: Reported on 09/28/2022), Disp: , Rfl:    famotidine (PEPCID) 20 MG tablet, Take 1 tablet by mouth 2 (two) times daily., Disp: , Rfl:    hydrocortisone cream 1 %, SMARTSIG:1 Topical Daily, Disp: , Rfl:    loratadine (CLARITIN) 10 MG tablet, TAKE ONE TABLET BY MOUTH  EVERY DAY FOR ALLERGIES, Disp: , Rfl:    losartan (COZAAR) 25 MG tablet, Take 0.5 tablets (12.5 mg total) by mouth daily., Disp: 15 tablet, Rfl: 1   losartan (COZAAR) 25 MG tablet, TAKE 1/2 TABLET BY MOUTH EVERY DAY FOR BLOOD PRESSURE, Disp: , Rfl:    metoprolol succinate (TOPROL XL) 25 MG 24 hr tablet, Take 0.5 tablets (12.5 mg total) by mouth daily., Disp: 45 tablet, Rfl: 3   nitroGLYCERIN (NITROSTAT) 0.4 MG SL tablet, Place 1 tablet (0.4 mg total) under the tongue every 5 (five) minutes x 3 doses as needed for chest pain., Disp: 25 tablet, Rfl: 12   oxymetazoline (AFRIN) 0.05 % nasal spray, Place 1 spray into both nostrils 2 (two) times daily as needed for congestion., Disp: , Rfl:    rosuvastatin (CRESTOR) 20 MG tablet, TAKE ONE TABLET BY MOUTH AT BEDTIME FOR CHOLESTEROL, Disp: , Rfl:    rosuvastatin (CRESTOR) 40 MG tablet, Take 1 tablet (40 mg total) by mouth at bedtime. (Patient not taking: Reported on 09/28/2022), Disp: 30 tablet, Rfl: 1   Simethicone (GAS-X PO), Take 1 tablet by mouth 3 (three) times daily as needed (heartburn). (Patient not taking: Reported on 09/28/2022), Disp: , Rfl:    simethicone (MYLICON) 80 MG chewable tablet, CHEW ONE TABLET BY MOUTH TWO TIMES A DAY, Disp: , Rfl:    STIOLTO RESPIMAT 2.5-2.5 MCG/ACT AERS, INHALE 2 INHALATIONS BY  ORAL INHALATION IN THE MORNING FOR CHRONIC OBSTRUCTIVE LUNGS, Disp: , Rfl:    warfarin (COUMADIN) 5 MG tablet, Take 1/2 tablet daily or as directed by the anti-coag clinic. (Patient not taking: Reported on 09/28/2022), Disp: 50 tablet, Rfl: 0  Past Medical History: Past Medical History:  Diagnosis Date   Actinic keratosis    Acute ST elevation myocardial infarction (STEMI) of anterior wall ([place])    a. 02/2019 s/p PCI/DES ->LAD.   Apical mural thrombus following MI ([place])    a. 02/2019 s/p Ant MI-->Echo: Small, fixed thrombus on the apical wall of LV-->coumadin.   Barrett's esophagus    CAD (coronary artery disease)    a. 02/2019 Ant STEMI/PCI:  LM nl, LAD 100p (4x18 Resolute Onyx DES), LCX/RCA min irregs, EF 35-45%.   Chronic back pain    Colon polyps    a. 07/2017 Colonoscopy: 80m sigmoid polyp, tubular adenoma, diverticulosis, int hemorrhoids.   COPD (chronic obstructive pulmonary disease) (HCC)    Degeneration of lumbar intervertebral disc    Dysphonia    GERD (gastroesophageal reflux disease)    HFrEF (heart failure with reduced ejection fraction) [place]    a. 02/2019 Echo: EF 40-45%, apical, septal, inf, and inforlateral AK. Mid and apical anterior/mid anteroseptal AK. Mildly dil LA.   Hydrocele    Hyperlipidemia    Ischemic cardiomyopathy    a. 02/2019 Echo: EF 40-45%.   Myalgia    Osteoarthritis    C-spine   PAF (paroxysmal atrial fibrillation) (HCC)    a. CHA2DS2VASc = 2.   [place] disease [place]    a. 2018 s/p aorto-iliac stent graft.   Reactive airway disease    Seborrheic keratosis    Sinus bradycardia    a. 02/2019 Jxnl rhythm and sinus brady in setting of Ant MI-->[facility] d/c'd.   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   Tobacco use disorder    Tremor     Tobacco Use: Social History   Tobacco Use  Smoking Status Every Day   Packs/day: 1.00   Years: 40.00   Total pack years: 40.00   Types: Cigarettes  Smokeless Tobacco Never    Labs: Review Flowsheet       Latest Ref Rng & Units 03/12/2019 05/03/2019  Labs for ITP Cardiac and Pulmonary Rehab  Cholestrol 100 - 199 mg/dL 104  96   LDL (calc) 0 - 99 mg/dL 59  46   HDL-C >39 mg/dL 31  34   Trlycerides 0 - 149 mg/dL 70  79   Hemoglobin A1c 4.8 - 5.6 % 6.0  -  TCO2 22 - 32 mmol/L 18  -     Pulmonary Assessment Scores:  Pulmonary Assessment Scores     Row Name 09/30/22 1517         ADL UCSD   ADL Phase Entry     SOB Score total 28     Rest 0     Walk 1     Stairs 3     Bath 1     Dress 1     Shop 1       CAT Score   CAT Score 9       mMRC Score   mMRC Score 1               UCSD: Self-administered rating of dyspnea associated with activities of daily living (ADLs) 6-point scale (0 = "not at all" to 5 = "maximal or unable to  do because of breathlessness")  Scoring Scores range from 0 to 120.  Minimally important difference is 5 units  CAT: CAT can identify the health impairment of COPD patients and is better correlated with disease progression.  CAT has a scoring range of zero to 40. The CAT score is classified into four groups of low (less than 10), medium (10 - 20), high (21-30) and very high (31-40) based on the impact level of disease on health status. A CAT score over 10 suggests significant symptoms.  A worsening CAT score could be explained by an exacerbation, poor medication adherence, poor inhaler technique, or progression of COPD or comorbid conditions.  CAT MCID is 2 points  mMRC: mMRC (Modified Medical Research Council) Dyspnea Scale is used to assess the degree of baseline functional disability in patients of respiratory disease due to dyspnea. No minimal important difference is established. A decrease in score of 1 point or greater is considered a positive change.   Pulmonary Function Assessment:  Pulmonary Function Assessment - 09/28/22 1117       Breath   Shortness of Breath Yes;Limiting activity             Exercise Target Goals: Exercise Program Goal: Individual exercise prescription set using results from initial 6 min walk test and THRR while considering  patient's activity barriers and safety.   Exercise Prescription Goal: Initial exercise prescription builds to 30-45 minutes a day of aerobic activity, 2-3 days per week.  Home exercise guidelines will be given to patient during program as part of exercise prescription that the participant will acknowledge.  Education: Aerobic Exercise: - Group verbal and visual presentation on the components of exercise prescription. Introduces F.I.T.T principle from ACSM for exercise  prescriptions.  Reviews F.I.T.T. principles of aerobic exercise including progression. Written material given at graduation. Flowsheet Row Pulmonary Rehab from 09/30/2022 in [facility] Cardiac and Pulmonary Rehab  Education need identified 09/30/22       Education: Resistance Exercise: - Group verbal and visual presentation on the components of exercise prescription. Introduces F.I.T.T principle from ACSM for exercise prescriptions  Reviews F.I.T.T. principles of resistance exercise including progression. Written material given at graduation.    Education: Exercise & Equipment Safety: - Individual verbal instruction and demonstration of equipment use and safety with use of the equipment. Flowsheet Row Pulmonary Rehab from 09/30/2022 in [facility] Cardiac and Pulmonary Rehab  Date 09/28/22  Educator [facility]  Instruction Review Code 1- Verbalizes Understanding       Education: Exercise Physiology & General Exercise Guidelines: - Group verbal and written instruction with models to review the exercise physiology of the cardiovascular system and associated critical values. Provides general exercise guidelines with specific guidelines to those with heart or lung disease.    Education: Flexibility, Balance, Mind/Body Relaxation: - Group verbal and visual presentation with interactive activity on the components of exercise prescription. Introduces F.I.T.T principle from ACSM for exercise prescriptions. Reviews F.I.T.T. principles of flexibility and balance exercise training including progression. Also discusses the mind body connection.  Reviews various relaxation techniques to help reduce and manage stress (i.e. Deep breathing, progressive muscle relaxation, and visualization). Balance handout provided to take home. Written material given at graduation.   Activity Barriers & Risk Stratification:  Activity Barriers & Cardiac Risk Stratification - 09/30/22 1541       Activity Barriers & Cardiac Risk  Stratification   Activity Barriers Joint Problems;Back Problems;Muscular Weakness;Deconditioning;Shortness of Breath;Neck/Spine Problems  6 Minute Walk:  6 Minute Walk     Row Name 09/30/22 1537         6 Minute Walk   Phase Initial     Distance 1730 feet     Walk Time 6 minutes     # of Rest Breaks 0     MPH 3.28     METS 4.24     RPE 13     Perceived Dyspnea  0     VO2 Peak 14.84     Symptoms Yes (comment)     Comments leg tightness     Resting HR 51 bpm     Resting BP 98/52     Resting Oxygen Saturation  97 %     Exercise Oxygen Saturation  during 6 min walk 98 %     Max Ex. HR 84 bpm     Max Ex. BP 142/60     2 Minute Post BP 96/58       Interval HR   1 Minute HR 65     2 Minute HR 78     3 Minute HR 77     4 Minute HR 82     5 Minute HR 82     6 Minute HR 84     2 Minute Post HR 54     Interval Heart Rate? Yes       Interval Oxygen   Interval Oxygen? Yes     Baseline Oxygen Saturation % 97 %     1 Minute Oxygen Saturation % 97 %     1 Minute Liters of Oxygen 0 L  RA     2 Minute Oxygen Saturation % 97 %     2 Minute Liters of Oxygen 0 L  RA     3 Minute Oxygen Saturation % 97 %     3 Minute Liters of Oxygen 0 L  RA     4 Minute Oxygen Saturation % 98 %     4 Minute Liters of Oxygen 0 L  RA     5 Minute Oxygen Saturation % 98 %     5 Minute Liters of Oxygen 0 L  RA     6 Minute Oxygen Saturation % 97 %     6 Minute Liters of Oxygen 0 L  RA     2 Minute Post Oxygen Saturation % 98 %     2 Minute Post Liters of Oxygen 0 L  RA             Oxygen Initial Assessment:  Oxygen Initial Assessment - 09/28/22 1117       Home Oxygen   Home Oxygen Device None    Sleep Oxygen Prescription None    Home Exercise Oxygen Prescription None    Home Resting Oxygen Prescription None      Initial 6 min Walk   Oxygen Used None      Program Oxygen Prescription   Program Oxygen Prescription None      Intervention   Short Term Goals To learn  and demonstrate proper use of respiratory medications;To learn and understand importance of maintaining oxygen saturations>88%;To learn and demonstrate proper pursed lip breathing techniques or other breathing techniques. ;To learn and understand importance of monitoring SPO2 with pulse oximeter and demonstrate accurate use of the pulse oximeter.    Long  Term Goals Maintenance of O2 saturations>88%;Compliance with respiratory medication;Demonstrates proper use of MDI's;Exhibits proper breathing techniques, such as  pursed lip breathing or other method taught during program session;Verbalizes importance of monitoring SPO2 with pulse oximeter and return demonstration             Oxygen Re-Evaluation:   Oxygen Discharge (Final Oxygen Re-Evaluation):   Initial Exercise Prescription:  Initial Exercise Prescription - 09/30/22 1500       Date of Initial Exercise RX and Referring Provider   Date 09/30/22    Referring Provider Bobbye Charleston MD      Oxygen   Maintain Oxygen Saturation 88% or higher      Treadmill   MPH 3    Grade 1    Minutes 15    METs 3.71      Recumbant Bike   Level 3    Watts 57    Minutes 15    METs 4.24      NuStep   Level 4    Minutes 15    METs 4.24      Prescription Details   Frequency (times per week) 2    Duration Progress to 30 minutes of continuous aerobic without signs/symptoms of physical distress      Intensity   THRR 40-80% of Max Heartrate 92-133    Ratings of Perceived Exertion 11-13    Perceived Dyspnea 0-4      Progression   Progression Continue to progress workloads to maintain intensity without signs/symptoms of physical distress.      Resistance Training   Training Prescription Yes    Weight 4 lb    Reps 10-15             Perform Capillary Blood Glucose checks as needed.  Exercise Prescription Changes:   Exercise Prescription Changes     Row Name 09/30/22 1500             Response to Exercise   Blood  Pressure (Admit) 98/52       Blood Pressure (Exercise) 142/60       Blood Pressure (Exit) 96/58       Heart Rate (Admit) 51 bpm       Heart Rate (Exercise) 84 bpm       Heart Rate (Exit) 54 bpm       Oxygen Saturation (Admit) 97 %       Oxygen Saturation (Exercise) 97 %       Oxygen Saturation (Exit) 98 %       Rating of Perceived Exertion (Exercise) 13       Perceived Dyspnea (Exercise) 0       Symptoms leg tightness       Comments 6MWT Results                Exercise Comments:   Exercise Goals and Review:   Exercise Goals     Row Name 09/30/22 1541             Exercise Goals   Increase Physical Activity Yes       Intervention Provide advice, education, support and counseling about physical activity/exercise needs.;Develop an individualized exercise prescription for aerobic and resistive training based on initial evaluation findings, risk stratification, comorbidities and participant's personal goals.       Expected Outcomes Short Term: Attend rehab on a regular basis to increase amount of physical activity.;Long Term: Add in home exercise to make exercise part of routine and to increase amount of physical activity.;Long Term: Exercising regularly at least 3-5 days a week.       Increase Strength  and Stamina Yes       Intervention Provide advice, education, support and counseling about physical activity/exercise needs.;Develop an individualized exercise prescription for aerobic and resistive training based on initial evaluation findings, risk stratification, comorbidities and participant's personal goals.       Expected Outcomes Short Term: Increase workloads from initial exercise prescription for resistance, speed, and METs.;Short Term: Perform resistance training exercises routinely during rehab and add in resistance training at home;Long Term: Improve cardiorespiratory fitness, muscular endurance and strength as measured by increased METs and functional capacity (6MWT)        Able to understand and use rate of perceived exertion (RPE) scale Yes       Intervention Provide education and explanation on how to use RPE scale       Expected Outcomes Short Term: Able to use RPE daily in rehab to express subjective intensity level;Long Term:  Able to use RPE to guide intensity level when exercising independently       Able to understand and use Dyspnea scale Yes       Intervention Provide education and explanation on how to use Dyspnea scale       Expected Outcomes Short Term: Able to use Dyspnea scale daily in rehab to express subjective sense of shortness of breath during exertion;Long Term: Able to use Dyspnea scale to guide intensity level when exercising independently       Knowledge and understanding of Target Heart Rate Range (THRR) Yes       Intervention Provide education and explanation of THRR including how the numbers were predicted and where they are located for reference       Expected Outcomes Short Term: Able to state/look up THRR;Short Term: Able to use daily as guideline for intensity in rehab;Long Term: Able to use THRR to govern intensity when exercising independently       Able to check pulse independently Yes       Intervention Provide education and demonstration on how to check pulse in carotid and radial arteries.;Review the importance of being able to check your own pulse for safety during independent exercise       Expected Outcomes Short Term: Able to explain why pulse checking is important during independent exercise;Long Term: Able to check pulse independently and accurately       Understanding of Exercise Prescription Yes       Intervention Provide education, explanation, and written materials on patient's individual exercise prescription       Expected Outcomes Short Term: Able to explain program exercise prescription;Long Term: Able to explain home exercise prescription to exercise independently                Exercise Goals Re-Evaluation  :   Discharge Exercise Prescription (Final Exercise Prescription Changes):  Exercise Prescription Changes - 09/30/22 1500       Response to Exercise   Blood Pressure (Admit) 98/52    Blood Pressure (Exercise) 142/60    Blood Pressure (Exit) 96/58    Heart Rate (Admit) 51 bpm    Heart Rate (Exercise) 84 bpm    Heart Rate (Exit) 54 bpm    Oxygen Saturation (Admit) 97 %    Oxygen Saturation (Exercise) 97 %    Oxygen Saturation (Exit) 98 %    Rating of Perceived Exertion (Exercise) 13    Perceived Dyspnea (Exercise) 0    Symptoms leg tightness    Comments 6MWT Results  Nutrition:  Target Goals: Understanding of nutrition guidelines, daily intake of sodium '1500mg'$ , cholesterol '200mg'$ , calories 30% from fat and 7% or less from saturated fats, daily to have 5 or more servings of fruits and vegetables.  Education: All About Nutrition: -Group instruction provided by verbal, written material, interactive activities, discussions, models, and posters to present general guidelines for heart healthy nutrition including fat, fiber, MyPlate, the role of sodium in heart healthy nutrition, utilization of the nutrition label, and utilization of this knowledge for meal planning. Follow up email sent as well. Written material given at graduation.   Biometrics:  Pre Biometrics - 09/30/22 1542       Pre Biometrics   Height 6' 2.25" (1.886 m)    Weight 176 lb 14.4 oz (80.2 kg)    Waist Circumference 34 inches    Hip Circumference 40 inches    Waist to Hip Ratio 0.85 %    BMI (Calculated) 22.56    Single Leg Stand 30 seconds              Nutrition Therapy Plan and Nutrition Goals:  Nutrition Therapy & Goals - 09/30/22 1520       Intervention Plan   Intervention Prescribe, educate and counsel regarding individualized specific dietary modifications aiming towards targeted core components such as weight, hypertension, lipid management, diabetes, heart failure and other  comorbidities.    Expected Outcomes Short Term Goal: Understand basic principles of dietary content, such as calories, fat, sodium, cholesterol and nutrients.;Short Term Goal: A plan has been developed with personal nutrition goals set during dietitian appointment.;Long Term Goal: Adherence to prescribed nutrition plan.             Nutrition Assessments:  MEDIFICTS Score Key: ?70 Need to make dietary changes  40-70 Heart Healthy Diet ? 40 Therapeutic Level Cholesterol Diet  Flowsheet Row Pulmonary Rehab from 09/30/2022 in [facility] Cardiac and Pulmonary Rehab  Picture Your Plate Total Score on Admission 53      Picture Your Plate Scores: [id number] Unhealthy dietary pattern with much room for improvement. 41-50 Dietary pattern unlikely to meet recommendations for good health and room for improvement. 51-60 More healthful dietary pattern, with some room for improvement.  >60 Healthy dietary pattern, although there may be some specific behaviors that could be improved.   Nutrition Goals Re-Evaluation:   Nutrition Goals Discharge (Final Nutrition Goals Re-Evaluation):   Psychosocial: Target Goals: Acknowledge presence or absence of significant depression and/or stress, maximize coping skills, provide positive support system. Participant is able to verbalize types and ability to use techniques and skills needed for reducing stress and depression.   Education: Stress, Anxiety, and Depression - Group verbal and visual presentation to define topics covered.  Reviews how body is impacted by stress, anxiety, and depression.  Also discusses healthy ways to reduce stress and to treat/manage anxiety and depression.  Written material given at graduation.   Education: Sleep Hygiene -Provides group verbal and written instruction about how sleep can affect your health.  Define sleep hygiene, discuss sleep cycles and impact of sleep habits. Review good sleep hygiene tips.    Initial Review &  Psychosocial Screening:  Initial Psych Review & Screening - 09/28/22 1120       Initial Review   Current issues with Current Stress Concerns    Comments He can look to his Wife, kids and grandkids for support. He is having more shortness of breath than usual and wants to get that under control. He has done with  Cardiac Rehab program in 2020.      Family Dynamics   Good Support System? Yes      Barriers   Psychosocial barriers to participate in program The patient should benefit from training in stress management and relaxation.      Screening Interventions   Interventions Encouraged to exercise;To provide support and resources with identified psychosocial needs;Program counselor consult;Provide feedback about the scores to participant    Expected Outcomes Short Term goal: Utilizing psychosocial counselor, staff and physician to assist with identification of specific Stressors or current issues interfering with healing process. Setting desired goal for each stressor or current issue identified.;Long Term Goal: Stressors or current issues are controlled or eliminated.;Short Term goal: Identification and review with participant of any Quality of Life or Depression concerns found by scoring the questionnaire.;Long Term goal: The participant improves quality of Life and PHQ9 Scores as seen by post scores and/or verbalization of changes             Quality of Life Scores:  Scores of 19 and below usually indicate a poorer quality of life in these areas.  A difference of  2-3 points is a clinically meaningful difference.  A difference of 2-3 points in the total score of the Quality of Life Index has been associated with significant improvement in overall quality of life, self-image, physical symptoms, and general health in studies assessing change in quality of life.  PHQ-9: Review Flowsheet       09/30/2022 10/23/2019 05/01/2019  Depression screen PHQ 2/9  Decreased Interest 0 0 0  Down,  Depressed, Hopeless 0 0 0  PHQ - 2 Score 0 0 0  Altered sleeping 0 0 0  Tired, decreased energy '3 1 1  '$ Change in appetite 2 0 1  Feeling bad or failure about yourself  0 0 0  Trouble concentrating 0 0 0  Moving slowly or fidgety/restless 0 0 0  Suicidal thoughts 0 0 0  PHQ-9 Score '5 1 2  '$ Difficult doing work/chores Very difficult Somewhat difficult Somewhat difficult   Interpretation of Total Score  Total Score Depression Severity:  1-4 = Minimal depression, 5-9 = Mild depression, 10-14 = Moderate depression, 15-19 = Moderately severe depression, 20-27 = Severe depression   Psychosocial Evaluation and Intervention:  Psychosocial Evaluation - 09/28/22 1121       Psychosocial Evaluation & Interventions   Interventions Encouraged to exercise with the program and follow exercise prescription;Relaxation education;Stress management education    Comments He can look to his Wife, kids and grandkids for support. He is having more shortness of breath than usual and wants to get that under control. He has done with Cardiac Rehab program in 2020.    Expected Outcomes Short: Start [name] to help with mood. Long: Maintain a healthy mental state.    Continue Psychosocial Services  Follow up required by staff             Psychosocial Re-Evaluation:   Psychosocial Discharge (Final Psychosocial Re-Evaluation):   Education: Education Goals: Education classes will be provided on a weekly basis, covering required topics. Participant will state understanding/return demonstration of topics presented.  Learning Barriers/Preferences:  Learning Barriers/Preferences - 09/28/22 1116       Learning Barriers/Preferences   Learning Barriers None    Learning Preferences Individual Instruction             General Pulmonary Education Topics:  Infection Prevention: - Provides verbal and written material to individual with discussion of infection control  including proper hand washing and  proper equipment cleaning during exercise session. Flowsheet Row Pulmonary Rehab from 09/30/2022 in [facility]  Date 09/28/22  Educator [facility]  Instruction Review Code 1- Verbalizes Understanding       Falls Prevention: - Provides verbal and written material to individual with discussion of falls prevention and safety. Flowsheet Row Pulmonary Rehab from 09/30/2022 in [facility]  Date 09/28/22  Educator [facility]  Instruction Review Code 1- Verbalizes Understanding       Chronic Lung Disease Review: - Group verbal instruction with posters, models, PowerPoint presentations and videos,  to review new updates, new respiratory medications, new advancements in procedures and treatments. Providing information on websites and "800" numbers for continued self-education. Includes information about supplement oxygen, available portable oxygen systems, continuous and intermittent flow rates, oxygen safety, concentrators, and Medicare reimbursement for oxygen. Explanation of Pulmonary Drugs, including class, frequency, complications, importance of spacers, rinsing mouth after steroid MDI's, and proper cleaning methods for nebulizers. Review of basic lung anatomy and physiology related to function, structure, and complications of lung disease. Review of risk factors. Discussion about methods for diagnosing sleep apnea and types of masks and machines for OSA. Includes a review of the use of types of environmental controls: home humidity, furnaces, filters, dust mite/pet prevention, HEPA vacuums. Discussion about weather changes, air quality and the benefits of nasal washing. Instruction on Warning signs, infection symptoms, calling MD promptly, preventive modes, and value of vaccinations. Review of effective airway clearance, coughing and/or vibration techniques. Emphasizing that all should Create an Action Plan. Written material given at graduation.   AED/CPR: - Group verbal  and written instruction with the use of models to demonstrate the basic use of the AED with the basic ABC's of resuscitation.    Anatomy and Cardiac Procedures: - Group verbal and visual presentation and models provide information about basic cardiac anatomy and function. Reviews the testing methods done to diagnose heart disease and the outcomes of the test results. Describes the treatment choices: Medical Management, Angioplasty, or Coronary Bypass Surgery for treating various heart conditions including Myocardial Infarction, Angina, Valve Disease, and Cardiac Arrhythmias.  Written material given at graduation.   Medication Safety: - Group verbal and visual instruction to review commonly prescribed medications for heart and lung disease. Reviews the medication, class of the drug, and side effects. Includes the steps to properly store meds and maintain the prescription regimen.  Written material given at graduation.   Other: -Provides group and verbal instruction on various topics (see comments)   Knowledge Questionnaire Score:  Knowledge Questionnaire Score - 09/30/22 1520       Knowledge Questionnaire Score   Pre Score 15/18              Core Components/Risk Factors/Patient Goals at Admission:  Personal Goals and Risk Factors at Admission - 09/28/22 1118       Core Components/Risk Factors/Patient Goals on Admission    Weight Management Yes;Weight Maintenance;Weight Gain    Intervention Weight Management: Develop a combined nutrition and exercise program designed to reach desired caloric intake, while maintaining appropriate intake of nutrient and fiber, sodium and fats, and appropriate energy expenditure required for the weight goal.;Weight Management: Provide education and appropriate resources to help participant work on and attain dietary goals.;Weight Management/Obesity: Establish reasonable short term and long term weight goals.    Expected Outcomes Short Term: Continue to  assess and modify interventions until short term weight is achieved;Long Term: Adherence  to nutrition and physical activity/exercise program aimed toward attainment of established weight goal;Weight Maintenance: Understanding of the daily nutrition guidelines, which includes 25-35% calories from fat, 7% or less cal from saturated fats, less than '200mg'$  cholesterol, less than 1.5gm of sodium, & 5 or more servings of fruits and vegetables daily;Understanding recommendations for meals to include 15-35% energy as protein, 25-35% energy from fat, 35-60% energy from carbohydrates, less than '200mg'$  of dietary cholesterol, 20-35 gm of total fiber daily;Understanding of distribution of calorie intake throughout the day with the consumption of 4-5 meals/snacks;Weight Gain: Understanding of general recommendations for a high calorie, high protein meal plan that promotes weight gain by distributing calorie intake throughout the day with the consumption for 4-5 meals, snacks, and/or supplements    Tobacco Cessation Yes    Intervention Assist the participant in steps to quit. Provide individualized education and counseling about committing to Tobacco Cessation, relapse prevention, and pharmacological support that can be provided by physician.;Advice worker, assist with locating and accessing local/national Quit Smoking programs, and support quit date choice.    Expected Outcomes Short Term: Will demonstrate readiness to quit, by selecting a quit date.;Short Term: Will quit all tobacco product use, adhering to prevention of relapse plan.;Long Term: Complete abstinence from all tobacco products for at least 12 months from quit date.    Improve shortness of breath with ADL's Yes    Intervention Provide education, individualized exercise plan and daily activity instruction to help decrease symptoms of SOB with activities of daily living.    Expected Outcomes Short Term: Improve cardiorespiratory fitness to achieve  a reduction of symptoms when performing ADLs;Long Term: Be able to perform more ADLs without symptoms or delay the onset of symptoms    Hypertension Yes    Intervention Provide education on lifestyle modifcations including regular physical activity/exercise, weight management, moderate sodium restriction and increased consumption of fresh fruit, vegetables, and low fat dairy, alcohol moderation, and smoking cessation.;Monitor prescription use compliance.    Expected Outcomes Short Term: Continued assessment and intervention until BP is < 140/58m HG in hypertensive participants. < 130/837mHG in hypertensive participants with diabetes, heart failure or chronic kidney disease.;Long Term: Maintenance of blood pressure at goal levels.    Lipids Yes    Intervention Provide education and support for participant on nutrition & aerobic/resistive exercise along with prescribed medications to achieve LDL '70mg'$ , HDL >'40mg'$ .    Expected Outcomes Short Term: Participant states understanding of desired cholesterol values and is compliant with medications prescribed. Participant is following exercise prescription and nutrition guidelines.;Long Term: Cholesterol controlled with medications as prescribed, with individualized exercise RX and with personalized nutrition plan. Value goals: LDL < '70mg'$ , HDL > 40 mg.             Education:Diabetes - Individual verbal and written instruction to review signs/symptoms of diabetes, desired ranges of glucose level fasting, after meals and with exercise. Acknowledge that pre and post exercise glucose checks will be done for 3 sessions at entry of program.   Know Your Numbers and Heart Failure: - Group verbal and visual instruction to discuss disease risk factors for cardiac and pulmonary disease and treatment options.  Reviews associated critical values for Overweight/Obesity, Hypertension, Cholesterol, and Diabetes.  Discusses basics of heart failure: signs/symptoms and  treatments.  Introduces Heart Failure Zone chart for action plan for heart failure.  Written material given at graduation. [facility]  Education need identified 09/30/22  Core Components/Risk Factors/Patient Goals Review:    Core Components/Risk Factors/Patient Goals at Discharge (Final Review):    ITP Comments:  ITP Comments     Row Name 09/28/22 1115 09/30/22 1516         ITP Comments Virtual Visit completed. Patient informed on EP and RD appointment and 6 Minute walk test. Patient also informed of patient health questionnaires on My Chart. Patient Verbalizes understanding. Visit diagnosis can be found in [facility] media,patient is [place]. Completed 6MWT and gym orientation. Initial ITP created and sent for review to Dr. Ottie Glazier, Medical Director.               Comments: Initial ITP

## 2022-10-05 ENCOUNTER — Encounter: Payer: No Typology Code available for payment source | Admitting: *Deleted

## 2022-10-05 DIAGNOSIS — R0609 Other forms of dyspnea: Secondary | ICD-10-CM | POA: Diagnosis not present

## 2022-10-05 NOTE — Progress Notes (Signed)
Daily Session Note  Patient Details  Name: Brian Mcclure MRN: [id number] Date of Birth: 02-14-1956 Referring Provider:   Flowsheet Row Pulmonary Rehab from 09/30/2022 in [facility] Card[facility] Pulmonary Re[facility]  Referring Provider Bobbye Charleston MD       Encounter Date: 10/05/2022  Check In:  Session Check In - 10/05/22 1400       Check-In   Supervising physician immediately available to respond to emergencies See telemetry face sheet for immediately available ER MD    Location [facility]-Cardiac & Pulmonary Rehab    Staff Present Renita Papa, RN BSN;Joseph Tessie Fass, Ernestina Patches, RN, [place]    Virtual Visit No    Medication changes reported     No    Fall or balance concerns reported    No    Warm-up and Cool-down Performed on first and last piece of equipment    Resistance Training Performed Yes    VAD Patient? No    PAD/SET Patient? No      Pain Assessment   Currently in Pain? No/denies                Social History   Tobacco Use  Smoking Status Every Day   Packs/day: 1.00   Years: 40.00   Additional pack years: 0.00   Total pack years: 40.00   Types: Cigarettes  Smokeless Tobacco Never    Goals Met:  Independence with exercise equipment Exercise tolerated well No report of concerns or symptoms today Strength training completed today  Goals Unmet:  Not Applicable  Comments: First full day of exercise!  Patient was oriented to gym and equipment including functions, settings, policies, and procedures.  Patient's individual exercise prescription and treatment plan were reviewed.  All starting workloads were established based on the results of the 6 minute walk test done at initial orientation visit.  The plan for exercise progression was also introduced and progression will be customized based on patient's performance and goals.    [name] is Medical Director for [name].  Dr. Ottie Glazier is Medical Director for  [name].

## 2022-10-07 ENCOUNTER — Encounter: Payer: Self-pay | Admitting: *Deleted

## 2022-10-07 DIAGNOSIS — R0609 Other forms of dyspnea: Secondary | ICD-10-CM

## 2022-10-07 NOTE — Progress Notes (Signed)
Pulmonary Individual Treatment Plan  Patient Details  Name: Brian Mcclure MRN: [id number] Date of Birth: Nov 27, 1955 Referring Provider:   Flowsheet Row Pulmonary Rehab from 09/30/2022 in [facility] Cardiac and Pulmonary Rehab  Referring Provider Bobbye Charleston MD       Initial Encounter Date:  Flowsheet Row Pulmonary Rehab from 09/30/2022 in [facility] Cardiac and Pulmonary Rehab  Date 09/30/22       Visit Diagnosis: Dyspnea on exertion  Patient's Home Medications on Admission:  Current Outpatient Medications:    albuterol (VENTOLIN HFA) 108 (90 Base) MCG/ACT inhaler, INHALE 2 PUFFS BY ORAL INHALATION EVERY 4 TO 6 HOURS AS NEEDED FOR CHRONIC OBSTRUCTIVE LUNG DISEASE BE SURE TO [place] MOUTHPIECE WITH WARM WATER ONCE A WEEK, Disp: , Rfl:    apixaban (ELIQUIS) 5 MG TABS tablet, TAKE ONE TABLET BY MOUTH EVERY 12 HOURS CAUTION BLOOD THINNER, Disp: , Rfl:    aspirin 81 MG chewable tablet, CHEW ONE TABLET BY MOUTH EVERY DAY, Disp: , Rfl:    Ca Carbonate-Mag Hydroxide (ROLAIDS PO), Take 1 tablet by mouth at bedtime as needed (heartburn/acid reflux)., Disp: , Rfl:    cholecalciferol (VITAMIN D) 25 MCG (1000 UT) tablet, Take 1,000 Units by mouth 2 (two) times daily., Disp: , Rfl:    clopidogrel (PLAVIX) 75 MG tablet, Take 1 tablet (75 mg total) by mouth daily., Disp: 30 tablet, Rfl: 1   diclofenac sodium (VOLTAREN) 1 % GEL, Apply 1 application topically daily as needed (pain). (Patient not taking: Reported on 09/28/2022), Disp: , Rfl:    diclofenac Sodium (VOLTAREN) 1 % GEL, APPLY 4 GRAMS TOPICALLY FOUR TIMES A DAY AS NEEDED FOR ARTHRITIC PAIN, Disp: , Rfl:    famotidine (PEPCID) 20 MG tablet, Take 20 mg by mouth daily.  (Patient not taking: Reported on 09/28/2022), Disp: , Rfl:    famotidine (PEPCID) 20 MG tablet, Take 1 tablet by mouth 2 (two) times daily., Disp: , Rfl:    hydrocortisone cream 1 %, SMARTSIG:1 Topical Daily, Disp: , Rfl:    loratadine (CLARITIN) 10 MG tablet, TAKE ONE TABLET BY MOUTH  EVERY DAY FOR ALLERGIES, Disp: , Rfl:    losartan (COZAAR) 25 MG tablet, Take 0.5 tablets (12.5 mg total) by mouth daily., Disp: 15 tablet, Rfl: 1   losartan (COZAAR) 25 MG tablet, TAKE 1/2 TABLET BY MOUTH EVERY DAY FOR BLOOD PRESSURE, Disp: , Rfl:    metoprolol succinate (TOPROL XL) 25 MG 24 hr tablet, Take 0.5 tablets (12.5 mg total) by mouth daily., Disp: 45 tablet, Rfl: 3   nitroGLYCERIN (NITROSTAT) 0.4 MG SL tablet, Place 1 tablet (0.4 mg total) under the tongue every 5 (five) minutes x 3 doses as needed for chest pain., Disp: 25 tablet, Rfl: 12   oxymetazoline (AFRIN) 0.05 % nasal spray, Place 1 spray into both nostrils 2 (two) times daily as needed for congestion., Disp: , Rfl:    rosuvastatin (CRESTOR) 20 MG tablet, TAKE ONE TABLET BY MOUTH AT BEDTIME FOR CHOLESTEROL, Disp: , Rfl:    rosuvastatin (CRESTOR) 40 MG tablet, Take 1 tablet (40 mg total) by mouth at bedtime. (Patient not taking: Reported on 09/28/2022), Disp: 30 tablet, Rfl: 1   Simethicone (GAS-X PO), Take 1 tablet by mouth 3 (three) times daily as needed (heartburn). (Patient not taking: Reported on 09/28/2022), Disp: , Rfl:    simethicone (MYLICON) 80 MG chewable tablet, CHEW ONE TABLET BY MOUTH TWO TIMES A DAY, Disp: , Rfl:    STIOLTO RESPIMAT 2.5-2.5 MCG/ACT AERS, INHALE 2 INHALATIONS BY  ORAL INHALATION IN THE MORNING FOR CHRONIC OBSTRUCTIVE LUNGS, Disp: , Rfl:    warfarin (COUMADIN) 5 MG tablet, Take 1/2 tablet daily or as directed by the anti-coag clinic. (Patient not taking: Reported on 09/28/2022), Disp: 50 tablet, Rfl: 0  Past Medical History: Past Medical History:  Diagnosis Date   Actinic keratosis    Acute ST elevation myocardial infarction (STEMI) of anterior wall ([place])    a. 02/2019 s/p PCI/DES ->LAD.   Apical mural thrombus following MI ([place])    a. 02/2019 s/p Ant MI-->Echo: Small, fixed thrombus on the apical wall of LV-->coumadin.   Barrett's esophagus    CAD (coronary artery disease)    a. 02/2019 Ant STEMI/PCI:  LM nl, LAD 100p (4x18 Resolute Onyx DES), LCX/RCA min irregs, EF 35-45%.   Chronic back pain    Colon polyps    a. 07/2017 Colonoscopy: 27mm sigmoid polyp, tubular adenoma, diverticulosis, int hemorrhoids.   COPD (chronic obstructive pulmonary disease) (HCC)    Degeneration of lumbar intervertebral disc    Dysphonia    GERD (gastroesophageal reflux disease)    HFrEF (heart failure with reduced ejection fraction) ([place])    a. 02/2019 Echo: EF 40-45%, apical, septal, inf, and inforlateral AK. Mid and apical anterior/mid anteroseptal AK. Mildly dil LA.   Hydrocele    Hyperlipidemia    Ischemic cardiomyopathy    a. 02/2019 Echo: EF 40-45%.   Myalgia    Osteoarthritis    C-spine   PAF (paroxysmal atrial fibrillation) (HCC)    a. CHA2DS2VASc = 2.   Peripheral vascular disease ([place])    a. 2018 s/p aorto-iliac stent graft.   Reactive airway disease    Seborrheic keratosis    Sinus bradycardia    a. 02/2019 Jxnl rhythm and sinus brady in setting of Ant MI-->[facility]B d/c'd.   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   Tobacco use disorder    Tremor     Tobacco Use: Social History   Tobacco Use  Smoking Status Every Day   Packs/day: 1.00   Years: 40.00   Additional pack years: 0.00   Total pack years: 40.00   Types: Cigarettes  Smokeless Tobacco Never    Labs: Review Flowsheet       Latest Ref Rng & Units 03/12/2019 05/03/2019  Labs for ITP Cardiac and Pulmonary Rehab  Cholestrol 100 - 199 mg/dL 104  96   LDL (calc) 0 - 99 mg/dL 59  46   HDL-C >39 mg/dL 31  34   Trlycerides 0 - 149 mg/dL 70  79   Hemoglobin A1c 4.8 - 5.6 % 6.0  -  TCO2 22 - 32 mmol/L 18  -     Pulmonary Assessment Scores:  Pulmonary Assessment Scores     Row Name 09/30/22 1517         ADL UCSD   ADL Phase Entry     SOB Score total 28     Rest 0     Walk 1     Stairs 3     Bath 1     Dress 1     Shop 1       CAT Score   CAT Score 9       mMRC Score   mMRC Score 1               UCSD: Self-administered rating of dyspnea associated with activities of daily living (ADLs) 6-point scale (0 = "not at all" to  5 = "maximal or unable to do because of breathlessness")  Scoring Scores range from 0 to 120.  Minimally important difference is 5 units  CAT: CAT can identify the health impairment of COPD patients and is better correlated with disease progression.  CAT has a scoring range of zero to 40. The CAT score is classified into four groups of low (less than 10), medium (10 - 20), high (21-30) and very high (31-40) based on the impact level of disease on health status. A CAT score over 10 suggests significant symptoms.  A worsening CAT score could be explained by an exacerbation, poor medication adherence, poor inhaler technique, or progression of COPD or comorbid conditions.  CAT MCID is 2 points  mMRC: mMRC (Modified Medical Research Council) Dyspnea Scale is used to assess the degree of baseline functional disability in patients of respiratory disease due to dyspnea. No minimal important difference is established. A decrease in score of 1 point or greater is considered a positive change.   Pulmonary Function Assessment:  Pulmonary Function Assessment - 09/28/22 1117       Breath   Shortness of Breath Yes;Limiting activity             Exercise Target Goals: Exercise Program Goal: Individual exercise prescription set using results from initial 6 min walk test and THRR while considering  patient's activity barriers and safety.   Exercise Prescription Goal: Initial exercise prescription builds to 30-45 minutes a day of aerobic activity, 2-3 days per week.  Home exercise guidelines will be given to patient during program as part of exercise prescription that the participant will acknowledge.  Education: Aerobic Exercise: - Group verbal and visual presentation on the components of exercise prescription. Introduces F.I.T.T principle from ACSM for exercise  prescriptions.  Reviews F.I.T.T. principles of aerobic exercise including progression. Written material given at graduation. Flowsheet Row Pulmonary Rehab from 09/30/2022 in [facility] Cardiac and Pulmonary Rehab  Education need identified 09/30/22       Education: Resistance Exercise: - Group verbal and visual presentation on the components of exercise prescription. Introduces F.I.T.T principle from ACSM for exercise prescriptions  Reviews F.I.T.T. principles of resistance exercise including progression. Written material given at graduation.    Education: Exercise & Equipment Safety: - Individual verbal instruction and demonstration of equipment use and safety with use of the equipment. Flowsheet Row Pulmonary Rehab from 09/30/2022 in [facility] Cardiac and Pulmonary Rehab  Date 09/28/22  Educator [facility]  Instruction Review Code 1- Verbalizes Understanding       Education: Exercise Physiology & General Exercise Guidelines: - Group verbal and written instruction with models to review the exercise physiology of the cardiovascular system and associated critical values. Provides general exercise guidelines with specific guidelines to those with heart or lung disease.    Education: Flexibility, Balance, Mind/Body Relaxation: - Group verbal and visual presentation with interactive activity on the components of exercise prescription. Introduces F.I.T.T principle from ACSM for exercise prescriptions. Reviews F.I.T.T. principles of flexibility and balance exercise training including progression. Also discusses the mind body connection.  Reviews various relaxation techniques to help reduce and manage stress (i.e. Deep breathing, progressive muscle relaxation, and visualization). Balance handout provided to take home. Written material given at graduation.   Activity Barriers & Risk Stratification:  Activity Barriers & Cardiac Risk Stratification - 09/30/22 1541       Activity Barriers & Cardiac Risk  Stratification   Activity Barriers Joint Problems;Back Problems;Muscular Weakness;Deconditioning;Shortness of Breath;Neck/Spine Problems  6 Minute Walk:  6 Minute Walk     Row Name 09/30/22 1537         6 Minute Walk   Phase Initial     Distance 1730 feet     Walk Time 6 minutes     # of Rest Breaks 0     MPH 3.28     METS 4.24     RPE 13     Perceived Dyspnea  0     VO2 Peak 14.84     Symptoms Yes (comment)     Comments leg tightness     Resting HR 51 bpm     Resting BP 98/52     Resting Oxygen Saturation  97 %     Exercise Oxygen Saturation  during 6 min walk 98 %     Max Ex. HR 84 bpm     Max Ex. BP 142/60     2 Minute Post BP 96/58       Interval HR   1 Minute HR 65     2 Minute HR 78     3 Minute HR 77     4 Minute HR 82     5 Minute HR 82     6 Minute HR 84     2 Minute Post HR 54     Interval Heart Rate? Yes       Interval Oxygen   Interval Oxygen? Yes     Baseline Oxygen Saturation % 97 %     1 Minute Oxygen Saturation % 97 %     1 Minute Liters of Oxygen 0 L  RA     2 Minute Oxygen Saturation % 97 %     2 Minute Liters of Oxygen 0 L  RA     3 Minute Oxygen Saturation % 97 %     3 Minute Liters of Oxygen 0 L  RA     4 Minute Oxygen Saturation % 98 %     4 Minute Liters of Oxygen 0 L  RA     5 Minute Oxygen Saturation % 98 %     5 Minute Liters of Oxygen 0 L  RA     6 Minute Oxygen Saturation % 97 %     6 Minute Liters of Oxygen 0 L  RA     2 Minute Post Oxygen Saturation % 98 %     2 Minute Post Liters of Oxygen 0 L  RA             Oxygen Initial Assessment:  Oxygen Initial Assessment - 09/28/22 1117       Home Oxygen   Home Oxygen Device None    Sleep Oxygen Prescription None    Home Exercise Oxygen Prescription None    Home Resting Oxygen Prescription None      Initial 6 min Walk   Oxygen Used None      Program Oxygen Prescription   Program Oxygen Prescription None      Intervention   Short Term Goals To learn  and demonstrate proper use of respiratory medications;To learn and understand importance of maintaining oxygen saturations>88%;To learn and demonstrate proper pursed lip breathing techniques or other breathing techniques. ;To learn and understand importance of monitoring SPO2 with pulse oximeter and demonstrate accurate use of the pulse oximeter.    Long  Term Goals Maintenance of O2 saturations>88%;Compliance with respiratory medication;Demonstrates proper use of MDI's;Exhibits proper breathing techniques, such as  pursed lip breathing or other method taught during program session;Verbalizes importance of monitoring SPO2 with pulse oximeter and return demonstration             Oxygen Re-Evaluation:  Oxygen Re-Evaluation     Row Name 10/05/22 1401             Goals/Expected Outcomes   Comments Reviewed PLB technique with pt.  Talked about how it works and it's importance in maintaining their exercise saturations.       Goals/Expected Outcomes Short: Become more profiecient at using PLB.   Long: Become independent at using PLB.                Oxygen Discharge (Final Oxygen Re-Evaluation):  Oxygen Re-Evaluation - 10/05/22 1401       Goals/Expected Outcomes   Comments Reviewed PLB technique with pt.  Talked about how it works and it's importance in maintaining their exercise saturations.    Goals/Expected Outcomes Short: Become more profiecient at using PLB.   Long: Become independent at using PLB.             Initial Exercise Prescription:  Initial Exercise Prescription - 09/30/22 1500       Date of Initial Exercise RX and Referring Provider   Date 09/30/22    Referring Provider Bobbye Charleston MD      Oxygen   Maintain Oxygen Saturation 88% or higher      Treadmill   MPH 3    Grade 1    Minutes 15    METs 3.71      Recumbant Bike   Level 3    Watts 57    Minutes 15    METs 4.24      NuStep   Level 4    Minutes 15    METs 4.24      Prescription  Details   Frequency (times per week) 2    Duration Progress to 30 minutes of continuous aerobic without signs/symptoms of physical distress      Intensity   THRR 40-80% of Max Heartrate 92-133    Ratings of Perceived Exertion 11-13    Perceived Dyspnea 0-4      Progression   Progression Continue to progress workloads to maintain intensity without signs/symptoms of physical distress.      Resistance Training   Training Prescription Yes    Weight 4 lb    Reps 10-15             Perform Capillary Blood Glucose checks as needed.  Exercise Prescription Changes:   Exercise Prescription Changes     Row Name 09/30/22 1500             Response to Exercise   Blood Pressure (Admit) 98/52       Blood Pressure (Exercise) 142/60       Blood Pressure (Exit) 96/58       Heart Rate (Admit) 51 bpm       Heart Rate (Exercise) 84 bpm       Heart Rate (Exit) 54 bpm       Oxygen Saturation (Admit) 97 %       Oxygen Saturation (Exercise) 97 %       Oxygen Saturation (Exit) 98 %       Rating of Perceived Exertion (Exercise) 13       Perceived Dyspnea (Exercise) 0       Symptoms leg tightness       Comments 6MWT Results  Exercise Comments:   Exercise Comments     Row Name 10/05/22 1401           Exercise Comments First full day of exercise!  Patient was oriented to gym and equipment including functions, settings, policies, and procedures.  Patient's individual exercise prescription and treatment plan were reviewed.  All starting workloads were established based on the results of the 6 minute walk test done at initial orientation visit.  The plan for exercise progression was also introduced and progression will be customized based on patient's performance and goals.                Exercise Goals and Review:   Exercise Goals     Row Name 09/30/22 1541             Exercise Goals   Increase Physical Activity Yes       Intervention Provide advice,  education, support and counseling about physical activity/exercise needs.;Develop an individualized exercise prescription for aerobic and resistive training based on initial evaluation findings, risk stratification, comorbidities and participant's personal goals.       Expected Outcomes Short Term: Attend rehab on a regular basis to increase amount of physical activity.;Long Term: Add in home exercise to make exercise part of routine and to increase amount of physical activity.;Long Term: Exercising regularly at least 3-5 days a week.       Increase Strength and Stamina Yes       Intervention Provide advice, education, support and counseling about physical activity/exercise needs.;Develop an individualized exercise prescription for aerobic and resistive training based on initial evaluation findings, risk stratification, comorbidities and participant's personal goals.       Expected Outcomes Short Term: Increase workloads from initial exercise prescription for resistance, speed, and METs.;Short Term: Perform resistance training exercises routinely during rehab and add in resistance training at home;Long Term: Improve cardiorespiratory fitness, muscular endurance and strength as measured by increased METs and functional capacity (6MWT)       Able to understand and use rate of perceived exertion (RPE) scale Yes       Intervention Provide education and explanation on how to use RPE scale       Expected Outcomes Short Term: Able to use RPE daily in rehab to express subjective intensity level;Long Term:  Able to use RPE to guide intensity level when exercising independently       Able to understand and use Dyspnea scale Yes       Intervention Provide education and explanation on how to use Dyspnea scale       Expected Outcomes Short Term: Able to use Dyspnea scale daily in rehab to express subjective sense of shortness of breath during exertion;Long Term: Able to use Dyspnea scale to guide intensity level when  exercising independently       Knowledge and understanding of Target Heart Rate Range (THRR) Yes       Intervention Provide education and explanation of THRR including how the numbers were predicted and where they are located for reference       Expected Outcomes Short Term: Able to state/look up THRR;Short Term: Able to use daily as guideline for intensity in rehab;Long Term: Able to use THRR to govern intensity when exercising independently       Able to check pulse independently Yes       Intervention Provide education and demonstration on how to check pulse in carotid and radial arteries.;Review the importance of being able to check your  own pulse for safety during independent exercise       Expected Outcomes Short Term: Able to explain why pulse checking is important during independent exercise;Long Term: Able to check pulse independently and accurately       Understanding of Exercise Prescription Yes       Intervention Provide education, explanation, and written materials on patient's individual exercise prescription       Expected Outcomes Short Term: Able to explain program exercise prescription;Long Term: Able to explain home exercise prescription to exercise independently                Exercise Goals Re-Evaluation :  Exercise Goals Re-Evaluation     Row Name 10/05/22 1402             Exercise Goal Re-Evaluation   Exercise Goals Review Increase Physical Activity;Able to understand and use rate of perceived exertion (RPE) scale;Knowledge and understanding of Target Heart Rate Range (THRR);Understanding of Exercise Prescription;Increase Strength and Stamina;Able to check pulse independently       Comments Reviewed RPE scale, THR and program prescription with pt today.  Pt voiced understanding and was given a copy of goals to take home.       Expected Outcomes Short: Use RPE daily to regulate intensity.  Long: Follow program prescription in THR.                Discharge  Exercise Prescription (Final Exercise Prescription Changes):  Exercise Prescription Changes - 09/30/22 1500       Response to Exercise   Blood Pressure (Admit) 98/52    Blood Pressure (Exercise) 142/60    Blood Pressure (Exit) 96/58    Heart Rate (Admit) 51 bpm    Heart Rate (Exercise) 84 bpm    Heart Rate (Exit) 54 bpm    Oxygen Saturation (Admit) 97 %    Oxygen Saturation (Exercise) 97 %    Oxygen Saturation (Exit) 98 %    Rating of Perceived Exertion (Exercise) 13    Perceived Dyspnea (Exercise) 0    Symptoms leg tightness    Comments 6MWT Results             Nutrition:  Target Goals: Understanding of nutrition guidelines, daily intake of sodium 1500mg , cholesterol 200mg , calories 30% from fat and 7% or less from saturated fats, daily to have 5 or more servings of fruits and vegetables.  Education: All About Nutrition: -Group instruction provided by verbal, written material, interactive activities, discussions, models, and posters to present general guidelines for heart healthy nutrition including fat, fiber, MyPlate, the role of sodium in heart healthy nutrition, utilization of the nutrition label, and utilization of this knowledge for meal planning. Follow up email sent as well. Written material given at graduation.   Biometrics:  Pre Biometrics - 09/30/22 1542       Pre Biometrics   Height 6' 2.25" (1.886 m)    Weight 176 lb 14.4 oz (80.2 kg)    Waist Circumference 34 inches    Hip Circumference 40 inches    Waist to Hip Ratio 0.85 %    BMI (Calculated) 22.56    Single Leg Stand 30 seconds              Nutrition Therapy Plan and Nutrition Goals:  Nutrition Therapy & Goals - 09/30/22 1520       Intervention Plan   Intervention Prescribe, educate and counsel regarding individualized specific dietary modifications aiming towards targeted core components such as weight, hypertension, lipid  management, diabetes, heart failure and other comorbidities.     Expected Outcomes Short Term Goal: Understand basic principles of dietary content, such as calories, fat, sodium, cholesterol and nutrients.;Short Term Goal: A plan has been developed with personal nutrition goals set during dietitian appointment.;Long Term Goal: Adherence to prescribed nutrition plan.             Nutrition Assessments:  MEDIFICTS Score Key: ?70 Need to make dietary changes  40-70 Heart Healthy Diet ? 40 Therapeutic Level Cholesterol Diet  Flowsheet Row Pulmonary Rehab from 09/30/2022 in [facility] Cardiac and Pulmonary Rehab  Picture Your Plate Total Score on Admission 53      Picture Your Plate Scores: [id number] Unhealthy dietary pattern with much room for improvement. 41-50 Dietary pattern unlikely to meet recommendations for good health and room for improvement. 51-60 More healthful dietary pattern, with some room for improvement.  >60 Healthy dietary pattern, although there may be some specific behaviors that could be improved.   Nutrition Goals Re-Evaluation:   Nutrition Goals Discharge (Final Nutrition Goals Re-Evaluation):   Psychosocial: Target Goals: Acknowledge presence or absence of significant depression and/or stress, maximize coping skills, provide positive support system. Participant is able to verbalize types and ability to use techniques and skills needed for reducing stress and depression.   Education: Stress, Anxiety, and Depression - Group verbal and visual presentation to define topics covered.  Reviews how body is impacted by stress, anxiety, and depression.  Also discusses healthy ways to reduce stress and to treat/manage anxiety and depression.  Written material given at graduation.   Education: Sleep Hygiene -Provides group verbal and written instruction about how sleep can affect your health.  Define sleep hygiene, discuss sleep cycles and impact of sleep habits. Review good sleep hygiene tips.    Initial Review & Psychosocial Screening:   Initial Psych Review & Screening - 09/28/22 1120       Initial Review   Current issues with Current Stress Concerns    Comments He can look to his Wife, kids and grandkids for support. He is having more shortness of breath than usual and wants to get that under control. He has done with Cardiac Rehab program in 2020.      Family Dynamics   Good Support System? Yes      Barriers   Psychosocial barriers to participate in program The patient should benefit from training in stress management and relaxation.      Screening Interventions   Interventions Encouraged to exercise;To provide support and resources with identified psychosocial needs;Program counselor consult;Provide feedback about the scores to participant    Expected Outcomes Short Term goal: Utilizing psychosocial counselor, staff and physician to assist with identification of specific Stressors or current issues interfering with healing process. Setting desired goal for each stressor or current issue identified.;Long Term Goal: Stressors or current issues are controlled or eliminated.;Short Term goal: Identification and review with participant of any Quality of Life or Depression concerns found by scoring the questionnaire.;Long Term goal: The participant improves quality of Life and PHQ9 Scores as seen by post scores and/or verbalization of changes             Quality of Life Scores:  Scores of 19 and below usually indicate a poorer quality of life in these areas.  A difference of  2-3 points is a clinically meaningful difference.  A difference of 2-3 points in the total score of the Quality of Life Index has been associated with significant improvement in  overall quality of life, self-image, physical symptoms, and general health in studies assessing change in quality of life.  PHQ-9: Review Flowsheet       09/30/2022 10/23/2019 05/01/2019  Depression screen PHQ 2/9  Decreased Interest 0 0 0  Down, Depressed, Hopeless 0 0 0   PHQ - 2 Score 0 0 0  Altered sleeping 0 0 0  Tired, decreased energy 3 1 1   Change in appetite 2 0 1  Feeling bad or failure about yourself  0 0 0  Trouble concentrating 0 0 0  Moving slowly or fidgety/restless 0 0 0  Suicidal thoughts 0 0 0  PHQ-9 Score 5 1 2   Difficult doing work/chores Very difficult Somewhat difficult Somewhat difficult   Interpretation of Total Score  Total Score Depression Severity:  1-4 = Minimal depression, 5-9 = Mild depression, 10-14 = Moderate depression, 15-19 = Moderately severe depression, 20-27 = Severe depression   Psychosocial Evaluation and Intervention:  Psychosocial Evaluation - 09/28/22 1121       Psychosocial Evaluation & Interventions   Interventions Encouraged to exercise with the program and follow exercise prescription;Relaxation education;Stress management education    Comments He can look to his Wife, kids and grandkids for support. He is having more shortness of breath than usual and wants to get that under control. He has done with Cardiac Rehab program in 2020.    Expected Outcomes Short: Start [name] to help with mood. Long: Maintain a healthy mental state.    Continue Psychosocial Services  Follow up required by staff             Psychosocial Re-Evaluation:   Psychosocial Discharge (Final Psychosocial Re-Evaluation):   Education: Education Goals: Education classes will be provided on a weekly basis, covering required topics. Participant will state understanding/return demonstration of topics presented.  Learning Barriers/Preferences:  Learning Barriers/Preferences - 09/28/22 1116       Learning Barriers/Preferences   Learning Barriers None    Learning Preferences Individual Instruction             General Pulmonary Education Topics:  Infection Prevention: - Provides verbal and written material to individual with discussion of infection control including proper hand washing and proper equipment cleaning  during exercise session. Flowsheet Row Pulmonary Rehab from 09/30/2022 in [facility] Cardiac and Pulmonary Rehab  Date 09/28/22  Educator [facility]  Instruction Review Code 1- Verbalizes Understanding       Falls Prevention: - Provides verbal and written material to individual with discussion of falls prevention and safety. Flowsheet Row Pulmonary Rehab from 09/30/2022 in [facility] Cardiac and Pulmonary Rehab  Date 09/28/22  Educator [facility]  Instruction Review Code 1- Verbalizes Understanding       Chronic Lung Disease Review: - Group verbal instruction with posters, models, PowerPoint presentations and videos,  to review new updates, new respiratory medications, new advancements in procedures and treatments. Providing information on websites and "800" numbers for continued self-education. Includes information about supplement oxygen, available portable oxygen systems, continuous and intermittent flow rates, oxygen safety, concentrators, and Medicare reimbursement for oxygen. Explanation of Pulmonary Drugs, including class, frequency, complications, importance of spacers, rinsing mouth after steroid MDI's, and proper cleaning methods for nebulizers. Review of basic lung anatomy and physiology related to function, structure, and complications of lung disease. Review of risk factors. Discussion about methods for diagnosing sleep apnea and types of masks and machines for OSA. Includes a review of the use of types of environmental controls: home humidity, furnaces, filters, dust mite/pet prevention,  HEPA vacuums. Discussion about weather changes, air quality and the benefits of nasal washing. Instruction on Warning signs, infection symptoms, calling MD promptly, preventive modes, and value of vaccinations. Review of effective airway clearance, coughing and/or vibration techniques. Emphasizing that all should Create an Action Plan. Written material given at graduation.   AED/CPR: - Group verbal and written instruction  with the use of models to demonstrate the basic use of the AED with the basic ABC's of resuscitation.    Anatomy and Cardiac Procedures: - Group verbal and visual presentation and models provide information about basic cardiac anatomy and function. Reviews the testing methods done to diagnose heart disease and the outcomes of the test results. Describes the treatment choices: Medical Management, Angioplasty, or Coronary Bypass Surgery for treating various heart conditions including Myocardial Infarction, Angina, Valve Disease, and Cardiac Arrhythmias.  Written material given at graduation.   Medication Safety: - Group verbal and visual instruction to review commonly prescribed medications for heart and lung disease. Reviews the medication, class of the drug, and side effects. Includes the steps to properly store meds and maintain the prescription regimen.  Written material given at graduation.   Other: -Provides group and verbal instruction on various topics (see comments)   Knowledge Questionnaire Score:  Knowledge Questionnaire Score - 09/30/22 1520       Knowledge Questionnaire Score   Pre Score 15/18              Core Components/Risk Factors/Patient Goals at Admission:  Personal Goals and Risk Factors at Admission - 09/28/22 1118       Core Components/Risk Factors/Patient Goals on Admission    Weight Management Yes;Weight Maintenance;Weight Gain    Intervention Weight Management: Develop a combined nutrition and exercise program designed to reach desired caloric intake, while maintaining appropriate intake of nutrient and fiber, sodium and fats, and appropriate energy expenditure required for the weight goal.;Weight Management: Provide education and appropriate resources to help participant work on and attain dietary goals.;Weight Management/Obesity: Establish reasonable short term and long term weight goals.    Expected Outcomes Short Term: Continue to assess and modify  interventions until short term weight is achieved;Long Term: Adherence to nutrition and physical activity/exercise program aimed toward attainment of established weight goal;Weight Maintenance: Understanding of the daily nutrition guidelines, which includes 25-35% calories from fat, 7% or less cal from saturated fats, less than 200mg  cholesterol, less than 1.5gm of sodium, & 5 or more servings of fruits and vegetables daily;Understanding recommendations for meals to include 15-35% energy as protein, 25-35% energy from fat, 35-60% energy from carbohydrates, less than 200mg  of dietary cholesterol, 20-35 gm of total fiber daily;Understanding of distribution of calorie intake throughout the day with the consumption of 4-5 meals/snacks;Weight Gain: Understanding of general recommendations for a high calorie, high protein meal plan that promotes weight gain by distributing calorie intake throughout the day with the consumption for 4-5 meals, snacks, and/or supplements    Tobacco Cessation Yes    Intervention Assist the participant in steps to quit. Provide individualized education and counseling about committing to Tobacco Cessation, relapse prevention, and pharmacological support that can be provided by physician.;Advice worker, assist with locating and accessing local/national Quit Smoking programs, and support quit date choice.    Expected Outcomes Short Term: Will demonstrate readiness to quit, by selecting a quit date.;Short Term: Will quit all tobacco product use, adhering to prevention of relapse plan.;Long Term: Complete abstinence from all tobacco products for at least 12 months from quit date.  Improve shortness of breath with ADL's Yes    Intervention Provide education, individualized exercise plan and daily activity instruction to help decrease symptoms of SOB with activities of daily living.    Expected Outcomes Short Term: Improve cardiorespiratory fitness to achieve a reduction of  symptoms when performing ADLs;Long Term: Be able to perform more ADLs without symptoms or delay the onset of symptoms    Hypertension Yes    Intervention Provide education on lifestyle modifcations including regular physical activity/exercise, weight management, moderate sodium restriction and increased consumption of fresh fruit, vegetables, and low fat dairy, alcohol moderation, and smoking cessation.;Monitor prescription use compliance.    Expected Outcomes Short Term: Continued assessment and intervention until BP is < 140/84mm HG in hypertensive participants. < 130/57mm HG in hypertensive participants with diabetes, heart failure or chronic kidney disease.;Long Term: Maintenance of blood pressure at goal levels.    Lipids Yes    Intervention Provide education and support for participant on nutrition & aerobic/resistive exercise along with prescribed medications to achieve LDL 70mg , HDL >40mg .    Expected Outcomes Short Term: Participant states understanding of desired cholesterol values and is compliant with medications prescribed. Participant is following exercise prescription and nutrition guidelines.;Long Term: Cholesterol controlled with medications as prescribed, with individualized exercise RX and with personalized nutrition plan. Value goals: LDL < 70mg , HDL > 40 mg.             Education:Diabetes - Individual verbal and written instruction to review signs/symptoms of diabetes, desired ranges of glucose level fasting, after meals and with exercise. Acknowledge that pre and post exercise glucose checks will be done for 3 sessions at entry of program.   Know Your Numbers and Heart Failure: - Group verbal and visual instruction to discuss disease risk factors for cardiac and pulmonary disease and treatment options.  Reviews associated critical values for Overweight/Obesity, Hypertension, Cholesterol, and Diabetes.  Discusses basics of heart failure: signs/symptoms and treatments.   Introduces Heart Failure Zone chart for action plan for heart failure.  Written material given at graduation. Flowsheet Row Pulmonary Rehab from 09/30/2022 in [facility] Cardiac and Pulmonary Rehab  Education need identified 09/30/22       Core Components/Risk Factors/Patient Goals Review:    Core Components/Risk Factors/Patient Goals at Discharge (Final Review):    ITP Comments:  ITP Comments     Row Name 09/28/22 1115 09/30/22 1516 10/05/22 1401 10/07/22 0825     ITP Comments Virtual Visit completed. Patient informed on EP and RD appointment and 6 Minute walk test. Patient also informed of patient health questionnaires on My Chart. Patient Verbalizes understanding. Visit diagnosis can be found in [facility] media,patient is [place]. Completed 6MWT and gym orientation. Initial ITP created and sent for review to Dr. Ottie Glazier, Medical Director. First full day of exercise!  Patient was oriented to gym and equipment including functions, settings, policies, and procedures.  Patient's individual exercise prescription and treatment plan were reviewed.  All starting workloads were established based on the results of the 6 minute walk test done at initial orientation visit.  The plan for exercise progression was also introduced and progression will be customized based on patient's performance and goals. 30 Day review completed. Medical Director ITP review done, changes made as directed, and signed approval by Medical Director.     new to program             Comments:

## 2022-10-09 ENCOUNTER — Encounter: Payer: No Typology Code available for payment source | Admitting: *Deleted

## 2022-10-09 DIAGNOSIS — R0609 Other forms of dyspnea: Secondary | ICD-10-CM

## 2022-10-09 NOTE — Progress Notes (Signed)
Daily Session Note  Patient Details  Name: Brian Mcclure MRN: [id number] Date of Birth: 1956/03/26 Referring Provider:   Flowsheet Row Pulmonary Rehab from 09/30/2022 in [facility] Cardiac and Pulmonary Rehab  Referring Provider Bobbye Charleston MD       Encounter Date: 10/09/2022  Check In:  Session Check In - 10/09/22 1150       Check-In   Supervising physician immediately available to respond to emergencies See telemetry face sheet for immediately available ER MD    Location [facility]-Cardiac & Pulmonary Rehab    Staff Present Heath Lark, RN, BSN, CCRP;Jessica [place], MA, RCEP, CCRP, CCET;Joseph [place], [place]    Virtual Visit No    Medication changes reported     No    Fall or balance concerns reported    No    Warm-up and Cool-down Performed on first and last piece of equipment    Resistance Training Performed Yes    VAD Patient? No    PAD/SET Patient? No      Pain Assessment   Currently in Pain? No/denies                Social History   Tobacco Use  Smoking Status Every Day   Packs/day: 1.00   Years: 40.00   Additional pack years: 0.00   Total pack years: 40.00   Types: Cigarettes  Smokeless Tobacco Never    Goals Met:  Proper associated with RPD/PD & O2 Sat Independence with exercise equipment Exercise tolerated well No report of concerns or symptoms today  Goals Unmet:  Not Applicable  Comments: Pt able to follow exercise prescription today without complaint.  Will continue to monitor for progression.    [name] is Medical Director for [place].  Dr. Ottie Glazier is Medical Director for [facility] Pulmonary Rehabilitation.

## 2022-10-12 ENCOUNTER — Encounter: Payer: No Typology Code available for payment source | Admitting: *Deleted

## 2022-10-12 DIAGNOSIS — R0609 Other forms of dyspnea: Secondary | ICD-10-CM

## 2022-10-12 NOTE — Progress Notes (Signed)
Daily Session Note  Patient Details  Name: Brian Mcclure MRN: [id number] Date of Birth: 12/11/1955 Referring Provider:   Flowsheet Row Pulmonary Rehab from 09/30/2022 in [facility] Cardiac and Pulmonary Rehab  Referring Provider Bobbye Charleston MD       Encounter Date: 10/12/2022  Check In:  Session Check In - 10/12/22 1118       Check-In   Supervising physician immediately available to respond to emergencies See telemetry face sheet for immediately available ER MD    Location [facility]-Cardiac & Pulmonary Rehab    Staff Present Darlyne Russian, RN, Doyce Para, BS, ACSM CEP, Exercise Physiologist;Noah Tickle, BS, Exercise Physiologist;Susanne Bice, RN, BSN, CCRP    Virtual Visit No    Medication changes reported     No    Fall or balance concerns reported    No    Warm-up and Cool-down Performed on first and last piece of equipment    Resistance Training Performed Yes    VAD Patient? No    PAD/SET Patient? No      Pain Assessment   Currently in Pain? No/denies                Social History   Tobacco Use  Smoking Status Every Day   Packs/day: 1.00   Years: 40.00   Additional pack years: 0.00   Total pack years: 40.00   Types: Cigarettes  Smokeless Tobacco Never    Goals Met:  Independence with exercise equipment Exercise tolerated well No report of concerns or symptoms today Strength training completed today  Goals Unmet:  Not Applicable  Comments: Pt able to follow exercise prescription today without complaint.  Will continue to monitor for progression.    [name] is Medical Director for [place].  Dr. Ottie Glazier is Medical Director for [facility] Pulmonary Rehabilitation.

## 2022-10-16 ENCOUNTER — Encounter: Payer: No Typology Code available for payment source | Admitting: *Deleted

## 2022-10-16 DIAGNOSIS — R0609 Other forms of dyspnea: Secondary | ICD-10-CM | POA: Diagnosis not present

## 2022-10-16 NOTE — Progress Notes (Signed)
Daily Session Note  Patient Details  Name: Brian Mcclure MRN: [id number] Date of Birth: 1956-03-15 Referring Provider:   Flowsheet Row Pulmonary Rehab from 09/30/2022 in [facility] Cardiac and Pulmonary Rehab  Referring Provider Bobbye Charleston MD       Encounter Date: 10/16/2022  Check In:  Session Check In - 10/16/22 1107       Check-In   Supervising physician immediately available to respond to emergencies See telemetry face sheet for immediately available ER MD    Location [facility]-Cardiac & Pulmonary Rehab    Staff Present Heath Lark, RN, BSN, CCRP;Krista Spencer RN, BSN;Joseph [place], [place]    Virtual Visit No    Medication changes reported     No    Fall or balance concerns reported    No    Tobacco Cessation Use Increase    Current number of cigarettes/nicotine per day     20    Warm-up and Cool-down Performed on first and last piece of equipment    Resistance Training Performed Yes    VAD Patient? No    PAD/SET Patient? No      Pain Assessment   Currently in Pain? No/denies                Social History   Tobacco Use  Smoking Status Every Day   Packs/day: 1.00   Years: 40.00   Additional pack years: 0.00   Total pack years: 40.00   Types: Cigarettes  Smokeless Tobacco Never    Goals Met:  Proper associated with RPD/PD & O2 Sat Independence with exercise equipment Exercise tolerated well No report of concerns or symptoms today  Goals Unmet:  Not Applicable  Comments: Pt able to follow exercise prescription today without complaint.  Will continue to monitor for progression.    [name] is Medical Director for [place].  Dr. Ottie Glazier is Medical Director for [facility] Pulmonary Rehabilitation.

## 2022-10-19 ENCOUNTER — Encounter: Payer: Medicare PPO | Attending: Registered Nurse | Admitting: *Deleted

## 2022-10-19 DIAGNOSIS — R0609 Other forms of dyspnea: Secondary | ICD-10-CM | POA: Diagnosis present

## 2022-10-19 NOTE — Progress Notes (Signed)
Daily Session Note  Patient Details  Name: Brian Mcclure MRN: [id number] Date of Birth: May 28, 1956 Referring Provider:   Flowsheet Row Pulmonary Rehab from 09/30/2022 in [facility] Cardiac and Pulmonary Rehab  Referring Provider Bobbye Charleston MD       Encounter Date: 10/19/2022  Check In:  Session Check In - 10/19/22 1114       Check-In   Supervising physician immediately available to respond to emergencies See telemetry face sheet for immediately available ER MD    Location [facility]-Cardiac & Pulmonary Rehab    Staff Present Darlyne Russian, RN, Doyce Para, BS, ACSM CEP, Exercise Physiologist;Meredith Sherryll Burger, RN BSN;Joseph Tessie Fass, RCP,RRT,BSRT    Virtual Visit No    Medication changes reported     No    Fall or balance concerns reported    No    Tobacco Cessation No Change    Warm-up and Cool-down Performed on first and last piece of equipment    Resistance Training Performed Yes    VAD Patient? No    PAD/SET Patient? No      Pain Assessment   Currently in Pain? No/denies                Social History   Tobacco Use  Smoking Status Every Day   Packs/day: 1.00   Years: 40.00   Additional pack years: 0.00   Total pack years: 40.00   Types: Cigarettes  Smokeless Tobacco Never    Goals Met:  Independence with exercise equipment Exercise tolerated well No report of concerns or symptoms today Strength training completed today  Goals Unmet:  Not Applicable  Comments: Pt able to follow exercise prescription today without complaint.  Will continue to monitor for progression.    [name] is Medical Director for [place].  Dr. Ottie Glazier is Medical Director for [facility] Pulmonary Rehabilitation.

## 2022-10-21 ENCOUNTER — Ambulatory Visit: Payer: No Typology Code available for payment source | Admitting: Internal Medicine

## 2022-10-21 ENCOUNTER — Ambulatory Visit: Payer: No Typology Code available for payment source | Admitting: Cardiology

## 2022-10-23 DIAGNOSIS — R0609 Other forms of dyspnea: Secondary | ICD-10-CM

## 2022-10-23 NOTE — Progress Notes (Signed)
Daily Session Note  Patient Details  Name: Brian Mcclure MRN: [id number] Date of Birth: 11-19-1955 Referring Provider:   Flowsheet Row Pulmonary Rehab from 09/30/2022 in [facility] Cardiac and Pulmonary Rehab  Referring Provider Vito Berger MD       Encounter Date: 10/23/2022  Check In:  Session Check In - 10/23/22 1104       Check-In   Staff Present Darcel Bayley, RN,BC,MSN;Joseph [place], RCP,RRT,BSRT;Noah Blessing, [place], Exercise Physiologist    Virtual Visit No    Medication changes reported     No    Fall or balance concerns reported    No    Tobacco Cessation No Change    Warm-up and Cool-down Performed on first and last piece of equipment    Resistance Training Performed Yes    VAD Patient? No    PAD/SET Patient? No      Pain Assessment   Currently in Pain? No/denies    Multiple Pain Sites No                Social History   Tobacco Use  Smoking Status Every Day   Packs/day: 1.00   Years: 40.00   Additional pack years: 0.00   Total pack years: 40.00   Types: Cigarettes  Smokeless Tobacco Never    Goals Met:  Independence with exercise equipment Exercise tolerated well No report of concerns or symptoms today  Goals Unmet:  Not Applicable  Comments: Pt able to follow exercise prescription today without complaint.  Will continue to monitor for progression.    Dr. Bethann Punches is Medical Director for [facility] Cardiac Rehabilitation.  Dr. Vida Rigger is Medical Director for [facility] Pulmonary Rehabilitation.

## 2022-10-26 ENCOUNTER — Encounter: Payer: Medicare PPO | Admitting: *Deleted

## 2022-10-26 DIAGNOSIS — R0609 Other forms of dyspnea: Secondary | ICD-10-CM

## 2022-10-26 NOTE — Progress Notes (Signed)
Daily Session Note  Patient Details  Name: Brian Mcclure MRN: [id number] Date of Birth: [birth year] Referring Provider:   Flowsheet Row Pulmonary Rehab from 09/30/2022 in [facility] Cardiac and Pulmonary Rehab  Referring Provider Vito Berger MD       Encounter Date: 10/26/2022  Check In:  Session Check In - 10/26/22 1106       Check-In   Supervising physician immediately available to respond to emergencies See telemetry face sheet for immediately available ER MD    Location [facility]-Cardiac & Pulmonary Rehab    Staff Present Lanny Hurst, RN, Franki Monte, BS, ACSM CEP, Exercise Physiologist;Noah Tickle, BS, Exercise Physiologist    Virtual Visit No    Medication changes reported     No    Fall or balance concerns reported    No    Tobacco Cessation No Change    Current number of cigarettes/nicotine per day     20    Warm-up and Cool-down Performed on first and last piece of equipment    Resistance Training Performed Yes    VAD Patient? No    PAD/SET Patient? No      Pain Assessment   Currently in Pain? No/denies                Social History   Tobacco Use  Smoking Status Every Day   Packs/day: 1.00   Years: 40.00   Additional pack years: 0.00   Total pack years: 40.00   Types: Cigarettes  Smokeless Tobacco Never    Goals Met:  Independence with exercise equipment Exercise tolerated well No report of concerns or symptoms today Strength training completed today  Goals Unmet:  Not Applicable  Comments: Pt able to follow exercise prescription today without complaint.  Will continue to monitor for progression.    Dr. Bethann Punches is Medical Director for [facility] Cardiac Rehabilitation.  Dr. Vida Rigger is Medical Director for [facility] Pulmonary Rehabilitation.

## 2022-10-28 ENCOUNTER — Ambulatory Visit: Payer: Medicare PPO | Attending: Internal Medicine | Admitting: Internal Medicine

## 2022-10-28 ENCOUNTER — Encounter: Payer: Self-pay | Admitting: Internal Medicine

## 2022-10-28 VITALS — BP 120/60 | HR 50 | Ht 75.0 in | Wt 181.2 lb

## 2022-10-28 DIAGNOSIS — I25119 Atherosclerotic heart disease of native coronary artery with unspecified angina pectoris: Secondary | ICD-10-CM

## 2022-10-28 DIAGNOSIS — I5022 Chronic systolic (congestive) heart failure: Secondary | ICD-10-CM | POA: Insufficient documentation

## 2022-10-28 DIAGNOSIS — I739 Peripheral vascular disease, unspecified: Secondary | ICD-10-CM | POA: Insufficient documentation

## 2022-10-28 DIAGNOSIS — I480 Paroxysmal atrial fibrillation: Secondary | ICD-10-CM

## 2022-10-28 DIAGNOSIS — E785 Hyperlipidemia, unspecified: Secondary | ICD-10-CM

## 2022-10-28 NOTE — Progress Notes (Signed)
New Outpatient Visit Date: 10/28/2022  Primary Care Provider: Center, [facility] [address] [place],  [place] [address]  Chief Complaint: Reestablish cardiology care in [place]  HPI:  Brian Mcclure is a 67 y.o. male who is being seen today for the evaluation of coronary artery disease. He has a history of coronary artery disease with anterior MI and primary PCI to the LAD in 2020, heart failure with reduced ejection fraction complicated by left ventricular thrombus, paroxysmal atrial fibrillation, PAD status post aortoiliac stenting, hyperlipidemia, COPD, and alcohol use.  He was previously followed in our practice by Dr. Azucena Cecil, having last been seen in 06/2019.  He subsequently transitioned his heart care to the [facility] but now wishes to reestablish in [place].  Today, Brian Mcclure has several concerns.  He feels like his endurance has worsened over the last few years.  He notes that he had not felt well for quite some time leading up to his STEMI in 2022.  After undergoing emergent PCI and completing cardiac rehab, he felt better than he had in many years.  However, he is beginning to have more exertional dyspnea and fatigue.  He notes that some of this may be due to his underlying lung disease; he is currently participating in pulmonary rehab.  He notes an episode of chest discomfort a year ago that brought him to the ED.  The pain also radiated to the left arm and was ultimately attributed to neck problems.  He continues to have intermittent chest and left arm pain though it is not clearly exertional (he notes that he does not push himself particularly hard).  He has been receiving acupuncture for this over the last 3 weeks but has not noticed much benefit yet.  Brian Mcclure is also concerned about pain in his calves when walking.  It is different than what he experienced prior to his aortoiliac stenting several years ago.  At that time, pain was primarily located in his thighs and  buttocks.  He does not have any rest pain but feels like there is tightening in the proximal calves when walking.  He also notes occasional palpitations that respond to simethicone.  He believes that his last episode of atrial fibrillation was 1.5 years ago.  He is currently on apixaban and aspirin without bleeding.  He notes that his resting heart rate has always been somewhat low.  --------------------------------------------------------------------------------------------------   Past Medical History:  Diagnosis Date   Actinic keratosis    Acute ST elevation myocardial infarction (STEMI) of anterior wall    a. 02/2019 s/p PCI/DES ->LAD.   Apical mural thrombus following MI    a. 02/2019 s/p Ant MI-->Echo: Small, fixed thrombus on the apical wall of LV-->coumadin.   Barrett's esophagus    CAD (coronary artery disease)    a. 02/2019 Ant STEMI/PCI: LM nl, LAD 100p (4x18 Resolute Onyx DES), LCX/RCA min irregs, EF 35-45%.   Chronic back pain    Colon polyps    a. 07/2017 Colonoscopy: 32mm sigmoid polyp, tubular adenoma, diverticulosis, int hemorrhoids.   COPD (chronic obstructive pulmonary disease)    Degeneration of lumbar intervertebral disc    Dysphonia    GERD (gastroesophageal reflux disease)    HFrEF (heart failure with reduced ejection fraction)    a. 02/2019 Echo: EF 40-45%, apical, septal, inf, and inforlateral AK. Mid and apical anterior/mid anteroseptal AK. Mildly dil LA.   Hydrocele    Hyperlipidemia    Ischemic cardiomyopathy    a. 02/2019  Echo: EF 40-45%.   Myalgia    Osteoarthritis    C-spine   PAF (paroxysmal atrial fibrillation)    a. CHA2DS2VASc = 2.   Peripheral vascular disease    a. 2018 s/p aorto-iliac stent graft.   Reactive airway disease    Seborrheic keratosis    Sinus bradycardia    a. 02/2019 Jxnl rhythm and sinus brady in setting of Ant MI-->CCB d/c'd.   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   Tobacco use disorder    Tremor      Past Surgical History:  Procedure Laterality Date   [facility]   [facility]COPY     COLONOSCOPY WITH PROPOFOL N/A 08/05/2017   Procedure: COLONOSCOPY WITH PROPOFOL;  Surgeon: Pasty Spillers, MD;  Location: [facility] ENDOSCOPY;  Service: Endoscopy;  Laterality: N/A;   CORONARY/GRAFT ACUTE MI REVASCULARIZATION N/A 03/12/2019   Procedure: Coronary/Graft Acute MI Revascularization;  Surgeon: Swaziland, Peter M, MD;  Location: [facility] INVASIVE CV LAB;  Service: Cardiovascular;  Laterality: N/A;   ESOPHAGOGASTRODUODENOSCOPY (EGD) WITH PROPOFOL N/A 08/05/2017   Procedure: ESOPHAGOGASTRODUODENOSCOPY (EGD) WITH PROPOFOL;  Surgeon: Pasty Spillers, MD;  Location: [facility] ENDOSCOPY;  Service: Endoscopy;  Laterality: N/A;   LEFT HEART CATH AND CORONARY ANGIOGRAPHY N/A 03/12/2019   Procedure: LEFT HEART CATH AND CORONARY ANGIOGRAPHY;  Surgeon: Swaziland, Peter M, MD;  Location: [facility] INVASIVE CV LAB;  Service: Cardiovascular;  Laterality: N/A;   NASAL SINUS SURGERY     several   SPLENECTOMY  01/2017    Current Meds  Medication Sig   apixaban (ELIQUIS) 5 MG TABS tablet TAKE ONE TABLET BY MOUTH EVERY 12 HOURS CAUTION BLOOD THINNER   aspirin 81 MG chewable tablet CHEW ONE TABLET BY MOUTH EVERY DAY   Ca Carbonate-Mag Hydroxide (ROLAIDS PO) Take 1 tablet by mouth at bedtime as needed (heartburn/acid reflux).   cholecalciferol (VITAMIN D) 25 MCG (1000 UT) tablet Take 1,000 Units by mouth 2 (two) times daily.   diclofenac sodium (VOLTAREN) 1 % GEL Apply 1 application  topically daily as needed (pain).   diclofenac Sodium (VOLTAREN) 1 % GEL APPLY 4 GRAMS TOPICALLY FOUR TIMES A DAY AS NEEDED FOR ARTHRITIC PAIN   famotidine (PEPCID) 20 MG tablet Take 20 mg by mouth daily.   hydrocortisone cream 1 % SMARTSIG:1 Topical Daily   loratadine (CLARITIN) 10 MG tablet TAKE ONE TABLET BY MOUTH EVERY DAY FOR ALLERGIES   losartan (COZAAR) 25 MG tablet Take 0.5 tablets (12.5 mg total) by mouth daily.    metoprolol succinate (TOPROL XL) 25 MG 24 hr tablet Take 0.5 tablets (12.5 mg total) by mouth daily.   nitroGLYCERIN (NITROSTAT) 0.4 MG SL tablet Place 1 tablet (0.4 mg total) under the tongue every 5 (five) minutes x 3 doses as needed for chest pain.   oxymetazoline (AFRIN) 0.05 % nasal spray Place 1 spray into both nostrils 2 (two) times daily as needed for congestion.   rosuvastatin (CRESTOR) 20 MG tablet TAKE ONE TABLET BY MOUTH AT BEDTIME FOR CHOLESTEROL   Simethicone (GAS-X PO) Take 1 tablet by mouth 3 (three) times daily as needed (heartburn).   simethicone (MYLICON) 80 MG chewable tablet CHEW ONE TABLET BY MOUTH TWO TIMES A DAY   STIOLTO RESPIMAT 2.5-2.5 MCG/ACT AERS INHALE 2 INHALATIONS BY ORAL INHALATION IN THE MORNING FOR CHRONIC OBSTRUCTIVE LUNGS   [DISCONTINUED] clopidogrel (PLAVIX) 75 MG tablet Take 1 tablet (75 mg total) by mouth daily.    Allergies: Statins, Amoxicillin-pot  clavulanate, Atorvastatin, Atorvastatin calcium, Clavulanic acid, Codeine, Menthol, Nicotine polacrilex, Pravastatin, Bupropion, Flunisolide, and Nicotine  Social History   Tobacco Use   Smoking status: Every Day    Packs/day: 1.00    Years: 40.00    Additional pack years: 0.00    Total pack years: 40.00    Types: Cigarettes   Smokeless tobacco: Never  Vaping Use   Vaping Use: Never used  Substance Use Topics   Alcohol use: Yes    Comment: rare - 1-2x/yr   Drug use: No    Family History  Problem Relation Age of Onset   Arrhythmia Mother    Diabetes Brother     Review of Systems: A 12-system review of systems was performed and was negative except as noted in the HPI.  --------------------------------------------------------------------------------------------------  Physical Exam: BP 120/60 (BP Location: Right Arm, Patient Position: Sitting, Cuff Size: Normal)   Pulse (!) 50   Ht 6\' 3"  (1.905 m)   Wt 181 lb 4 oz (82.2 kg)   SpO2 98%   BMI 22.65 kg/m   General:  NAD HEENT: No  conjunctival pallor or scleral icterus. Neck: Supple without lymphadenopathy, thyromegaly, JVD, or HJR. No carotid bruit. Lungs: Normal work of breathing.  Mildly diminished breath sounds throughout without wheezes or crackles. Heart: Bradycardic but regular without murmurs, rubs, or gallops. Abd: Bowel sounds present. Soft, NT/ND without hepatosplenomegaly Ext: No lower extremity edema.  Pedal pulses are diminished bilaterally. Skin: Warm and dry without rash. Neuro: CNIII-XII intact. Strength and fine-touch sensation intact in upper and lower extremities bilaterally. Psych: Normal mood and affect.  EKG: Sinus bradycardia without significant abnormalities.  Lab Results  Component Value Date   WBC 11.5 (H) 11/05/2021   HGB 13.7 11/05/2021   HCT 42.4 11/05/2021   MCV 94.2 11/05/2021   PLT 215 11/05/2021    Lab Results  Component Value Date   NA 140 11/05/2021   K 3.6 11/05/2021   CL 106 11/05/2021   CO2 28 11/05/2021   BUN 12 11/05/2021   CREATININE 1.13 11/05/2021   GLUCOSE 110 (H) 11/05/2021   ALT 24 05/03/2019   Outside labs (07/28/2022, [place]): CMP: Sodium 138, potassium 4.5, chloride 106, CO2 27, BUN 11, creatinine 1.0, glucose 105, calcium 9.0, AST 21, ALT 30, alkaline phosphatase 61, total bilirubin 0.4, total protein 6.6, albumin 3.9  Lipid panel: Total cholesterol 98, HDL 39, LDL 47, triglycerides 59  --------------------------------------------------------------------------------------------------  ASSESSMENT AND PLAN: Coronary artery disease with angina pectoralis: I am concerned that some of Brian Mcclure exercise intolerance and sporadic chest discomfort could be due to progression of his CAD.  However, his underlying lung disease and continued tobacco abuse are likely playing a role as well.  We have discussed further evaluation options and have agreed to obtain an echocardiogram and pharmacologic MPI.  We will continue current medications for secondary  prevention, though if MPI is reassuring, I would favor discontinuation of aspirin in the setting of long-term anticoagulation with apixaban.  Claudication in peripheral vascular disease: Brian Mcclure reports bilateral calf pain concerning for claudication, particularly given his history of aortic iliac disease.  He also notes that he may have some outflow disease.  We will obtain ABIs as well as aortic iliac and bilateral lower extremity Dopplers.  Continue secondary prevention with aspirin/apixaban as well as rosuvastatin as outlined above.  If there is evidence of significant PAD, Brian Mcclure will be referred to Dr. [name] for further management.  Chronic HFrEF with recovered ejection fraction:  Brian Mcclure appears euvolemic on exam today but has shortness of breath with modest activity.  This could be due either to heart failure, ischemic heart disease, or COPD.  We will obtain an echo, as above.  I have encouraged Brian Mcclure to continue with pulmonary rehab and to quit smoking.  Continue current regimen of metoprolol succinate and losartan.  Resting bradycardia and low normal blood pressure preclude further escalation at this time.  Paroxysmal atrial fibrillation: EKG today shows sinus bradycardia.  Brian Mcclure does not believe he has had an episode of atrial fibrillation for 1.5 years.  He will need to remain on indefinite apixaban in the setting of a CHA2DS2-VASc score of at least 3.  Hyperlipidemia: Lipids well-controlled on last set of labs earlier this year at the [facility].  Continue high intensity statin therapy.  Shared Decision Making/Informed Consent The risks [chest pain, shortness of breath, cardiac arrhythmias, dizziness, blood pressure fluctuations, myocardial infarction, stroke/transient ischemic attack, nausea, vomiting, allergic reaction, radiation exposure, metallic taste sensation and life-threatening complications (estimated to be 1 in 10,000)], benefits (risk stratification, diagnosing  coronary artery disease, treatment guidance) and alternatives of a nuclear stress test were discussed in detail with Brian Mcclure and he agrees to proceed.  Follow-up: Return to clinic after aforementioned testing (~1 month).  Brian Kendallhristopher Darlene Brozowski, MD 10/28/2022 10:14 AM

## 2022-10-28 NOTE — Patient Instructions (Signed)
Medication Instructions:  Your Physician recommend you continue on your current medication as directed.    *If you need a refill on your cardiac medications before your next appointment, please call your pharmacy*   Lab Work: None ordered today   Testing/Procedures: Your physician has requested that you have an echocardiogram. Echocardiography is a painless test that uses sound waves to create images of your heart. It provides your doctor with information about the size and shape of your heart and how well your heart's chambers and valves are working.   You may receive an ultrasound enhancing agent through an IV if needed to better visualize your heart during the echo. This procedure takes approximately one hour.  There are no restrictions for this procedure.  This will take place at [address], [place] [address]  Your physician has requested that you have an ankle brachial index (ABI). During this test an ultrasound and blood pressure cuff are used to evaluate the arteries that supply the arms and legs with blood.  Allow thirty minutes for this exam.  There are no restrictions or special instructions.  This will take place at [address], [place] [address]   Your physician has requested that you have a lower extremity arterial duplex. During this test, ultrasound is used to evaluate arterial blood flow in the legs. Allow one hour for this exam. There are no restrictions or special instructions. This will take place at [address] ([address], [place] [address]  Your physician has recommended that you have an AORTA/ILIAC Duplex. This is a noninvasive diagnostic test that uses ultrasound technology to look at the aorta and iliac arteries to detect any restriction to blood flow to the buttocks, groin, legs or feet.  No food after 11PM the night before.  Water is OK. (Don't drink liquids if you  have been instructed not to for ANOTHER test). Avoid foods that produce bowel gas, for 24 hours prior to exam (see below). No breakfast, no chewing gum, no smoking or carbonated beverages. Patient may take morning medications with water. Come in for test at least 15 minutes early to register. This will take approximately 60 minutes This will take place at [address], [place] [address]    [facility] MYOVIEW  Your caregiver has ordered a Stress Test with nuclear imaging. The purpose of this test is to evaluate the blood supply to your heart muscle. This procedure is referred to as a "Non-Invasive Stress Test." This is because other than having an IV started in your vein, nothing is inserted or "invades" your body. Cardiac stress tests are done to find areas of poor blood flow to the heart by determining the extent of coronary artery disease (CAD). Some patients exercise on a treadmill, which naturally increases the blood flow to your heart, while others who are  unable to walk on a treadmill due to physical limitations have a pharmacologic/chemical stress agent called Lexiscan . This medicine will mimic walking on a treadmill by temporarily increasing your coronary blood flow.   Please note: these test may take anywhere between 2-4 hours to complete  PLEASE REPORT TO [facility] MEDICAL MALL ENTRANCE  THE VOLUNTEERS AT THE FIRST DESK WILL DIRECT YOU WHERE TO GO  How to prepare for your Myoview test:  Do not eat or drink after midnight No caffeine for 24 hours prior to test No smoking 24 hours prior  to test. Your medication may be taken with water.  If your doctor stopped a medication because of this test, do not take that medication. Ladies, please do not wear dresses.  Skirts or pants are appropriate. Please wear a short sleeve shirt. No perfume, cologne or lotion. Wear comfortable walking shoes. No heels!   Follow-Up: At [facility], you and your health  needs are our priority.  As part of our continuing mission to provide you with exceptional heart care, we have created designated Provider Care Teams.  These Care Teams include your primary Cardiologist (physician) and Advanced Practice Providers (APPs -  Physician Assistants and Nurse Practitioners) who all work together to provide you with the care you need, when you need it.  We recommend signing up for the patient portal called "MyChart".  Sign up information is provided on this After Visit Summary.  MyChart is used to connect with patients for Virtual Visits (Telemedicine).  Patients are able to view lab/test results, encounter notes, upcoming appointments, etc.  Non-urgent messages can be sent to your provider as well.   To learn more about what you can do with MyChart, go to [email or web address].    Your next appointment:   1 month(s)  Provider:   You may see Yvonne Kendall, MD or one of the following Advanced Practice Providers on your designated Care Team:   Nicolasa Ducking, NP Eula Listen, PA-C Cadence Fransico Michael, PA-C Charlsie Quest, NP

## 2022-10-30 ENCOUNTER — Encounter: Payer: Medicare PPO | Admitting: *Deleted

## 2022-10-30 DIAGNOSIS — R0609 Other forms of dyspnea: Secondary | ICD-10-CM | POA: Diagnosis not present

## 2022-10-30 NOTE — Progress Notes (Signed)
Daily Session Note  Patient Details  Name: Brian Mcclure MRN: [id number] Date of Birth: [birth year] Referring Provider:   Flowsheet Row Pulmonary Rehab from 09/30/2022 in [facility] Cardiac [facility] Pulmonary [facility]  Referring Provider Vito Berger MD       Encounter Date: 10/30/2022  Check In:  Session Check In - 10/30/22 1115       Check-In   Supervising physician immediately available to respond to emergencies See telemetry face sheet for immediately available ER MD    Location [facility]-Cardiac & Pulmonary Rehab    Staff Present Elige Ko, RCP,RRT,BSRT;Harlene Ramus, RN, BSN;Hermelinda Diegel, MA, RCEP, CCRP, CCET    Virtual Visit No    Medication changes reported     No    Fall or balance concerns reported    No    Tobacco Cessation No Change    Warm-up and Cool-down Performed on first and last piece of equipment    Resistance Training Performed Yes    VAD Patient? No    PAD/SET Patient? No      Pain Assessment   Currently in Pain? No/denies                Social History   Tobacco Use  Smoking Status Every Day   Packs/day: 1.00   Years: 40.00   Additional pack years: 0.00   Total pack years: 40.00   Types: Cigarettes  Smokeless Tobacco Never    Goals Met:  Proper associated with RPD/PD & O2 Sat Independence with exercise equipment Using PLB without cueing & demonstrates good technique Exercise tolerated well No report of concerns or symptoms today Strength training completed today  Goals Unmet:  Not Applicable  Comments: Pt able to follow exercise prescription today without complaint.  Will continue to monitor for progression.  Reviewed home exercise with pt today.  Pt plans to add in some walking in the garden for exercise. Reviewed THR, pulse, RPE, sign and symptoms, pulse oximetery and when to call 911 or MD.  Also discussed weather considerations and indoor options.  Pt voiced understanding.  Dr. Bethann Punches is Medical Director for [facility]  Cardiac Rehabilitation.  Dr. Vida Rigger is Medical Director for [facility] Pulmonary Rehabilitation.

## 2022-11-02 ENCOUNTER — Encounter: Payer: Medicare PPO | Admitting: *Deleted

## 2022-11-04 ENCOUNTER — Encounter: Payer: Self-pay | Admitting: *Deleted

## 2022-11-04 DIAGNOSIS — R0609 Other forms of dyspnea: Secondary | ICD-10-CM

## 2022-11-04 NOTE — Progress Notes (Signed)
Pulmonary Individual Treatment Plan  Patient Details  Name: Brian Mcclure MRN: [id number] Date of Birth: 1955-11-16 Referring Provider:   Flowsheet Row Pulmonary Rehab from 09/30/2022 in [facility] Cardiac and Pulmonary Rehab  Referring Provider Vito Berger MD       Initial Encounter Date:  Flowsheet Row Pulmonary Rehab from 09/30/2022 in [facility] Cardiac and Pulmonary Rehab  Date 09/30/22       Visit Diagnosis: Dyspnea on exertion  Patient's Home Medications on Admission:  Current Outpatient Medications:    albuterol (VENTOLIN HFA) 108 (90 Base) MCG/ACT inhaler, INHALE 2 PUFFS BY ORAL INHALATION EVERY 4 TO 6 HOURS AS NEEDED FOR CHRONIC OBSTRUCTIVE LUNG DISEASE BE SURE TO WASH MOUTHPIECE WITH WARM WATER ONCE A WEEK (Patient not taking: Reported on 10/28/2022), Disp: , Rfl:    apixaban (ELIQUIS) 5 MG TABS tablet, TAKE ONE TABLET BY MOUTH EVERY 12 HOURS CAUTION BLOOD THINNER, Disp: , Rfl:    aspirin 81 MG chewable tablet, CHEW ONE TABLET BY MOUTH EVERY DAY, Disp: , Rfl:    Ca Carbonate-Mag Hydroxide (ROLAIDS PO), Take 1 tablet by mouth at bedtime as needed (heartburn/acid reflux)., Disp: , Rfl:    cholecalciferol (VITAMIN D) 25 MCG (1000 UT) tablet, Take 1,000 Units by mouth 2 (two) times daily., Disp: , Rfl:    diclofenac sodium (VOLTAREN) 1 % GEL, Apply 1 application  topically daily as needed (pain)., Disp: , Rfl:    diclofenac Sodium (VOLTAREN) 1 % GEL, APPLY 4 GRAMS TOPICALLY FOUR TIMES A DAY AS NEEDED FOR ARTHRITIC PAIN, Disp: , Rfl:    famotidine (PEPCID) 20 MG tablet, Take 20 mg by mouth daily., Disp: , Rfl:    hydrocortisone cream 1 %, SMARTSIG:1 Topical Daily, Disp: , Rfl:    loratadine (CLARITIN) 10 MG tablet, TAKE ONE TABLET BY MOUTH EVERY DAY FOR ALLERGIES, Disp: , Rfl:    losartan (COZAAR) 25 MG tablet, Take 0.5 tablets (12.5 mg total) by mouth daily., Disp: 15 tablet, Rfl: 1   metoprolol succinate (TOPROL XL) 25 MG 24 hr tablet, Take 0.5 tablets (12.5 mg total) by mouth  daily., Disp: 45 tablet, Rfl: 3   nitroGLYCERIN (NITROSTAT) 0.4 MG SL tablet, Place 1 tablet (0.4 mg total) under the tongue every 5 (five) minutes x 3 doses as needed for chest pain., Disp: 25 tablet, Rfl: 12   oxymetazoline (AFRIN) 0.05 % nasal spray, Place 1 spray into both nostrils 2 (two) times daily as needed for congestion., Disp: , Rfl:    rosuvastatin (CRESTOR) 20 MG tablet, TAKE ONE TABLET BY MOUTH AT BEDTIME FOR CHOLESTEROL, Disp: , Rfl:    Simethicone (GAS-X PO), Take 1 tablet by mouth 3 (three) times daily as needed (heartburn)., Disp: , Rfl:    simethicone (MYLICON) 80 MG chewable tablet, CHEW ONE TABLET BY MOUTH TWO TIMES A DAY, Disp: , Rfl:    STIOLTO RESPIMAT 2.5-2.5 MCG/ACT AERS, INHALE 2 INHALATIONS BY ORAL INHALATION IN THE MORNING FOR CHRONIC OBSTRUCTIVE LUNGS, Disp: , Rfl:   Past Medical History: Past Medical History:  Diagnosis Date   Actinic keratosis    Acute ST elevation myocardial infarction (STEMI) of anterior wall    a. 02/2019 s/p PCI/DES ->LAD.   Apical mural thrombus following MI    a. 02/2019 s/p Ant MI-->Echo: Small, fixed thrombus on the apical wall of LV-->coumadin.   Barrett's esophagus    CAD (coronary artery disease)    a. 02/2019 Ant STEMI/PCI: LM nl, LAD 100p (4x18 Resolute Onyx DES), LCX/RCA min irregs, EF 35-45%.  Chronic back pain    Colon polyps    a. 07/2017 Colonoscopy: 5mm sigmoid polyp, tubular adenoma, diverticulosis, int hemorrhoids.   COPD (chronic obstructive pulmonary disease)    Degeneration of lumbar intervertebral disc    Dysphonia    GERD (gastroesophageal reflux disease)    HFrEF (heart failure with reduced ejection fraction)    a. 02/2019 Echo: EF 40-45%, apical, septal, inf, and inforlateral AK. Mid and apical anterior/mid anteroseptal AK. Mildly dil LA.   Hydrocele    Hyperlipidemia    Ischemic cardiomyopathy    a. 02/2019 Echo: EF 40-45%.   Myalgia    Osteoarthritis    C-spine   PAF (paroxysmal atrial fibrillation)    a.  CHA2DS2VASc = 2.   Peripheral vascular disease    a. 2018 s/p aorto-iliac stent graft.   Reactive airway disease    Seborrheic keratosis    Sinus bradycardia    a. 02/2019 Jxnl rhythm and sinus brady in setting of Ant MI-->CCB d/c'd.   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   Tobacco use disorder    Tremor     Tobacco Use: Social History   Tobacco Use  Smoking Status Every Day   Packs/day: 1.00   Years: 40.00   Additional pack years: 0.00   Total pack years: 40.00   Types: Cigarettes  Smokeless Tobacco Never    Labs: Review Flowsheet       Latest Ref Rng & Units 03/12/2019 05/03/2019  Labs for ITP Cardiac and Pulmonary Rehab  Cholestrol 100 - 199 mg/dL 161  96   LDL (calc) 0 - 99 mg/dL 59  46   HDL-C >09 mg/dL 31  34   Trlycerides 0 - 149 mg/dL 70  79   Hemoglobin U0A 4.8 - 5.6 % 6.0  -  TCO2 22 - 32 mmol/L 18  -     Pulmonary Assessment Scores:  Pulmonary Assessment Scores     Row Name 09/30/22 1517         ADL UCSD   ADL Phase Entry     SOB Score total 28     Rest 0     Walk 1     Stairs 3     Bath 1     Dress 1     Shop 1       CAT Score   CAT Score 9       mMRC Score   mMRC Score 1              UCSD: Self-administered rating of dyspnea associated with activities of daily living (ADLs) 6-point scale (0 = "not at all" to 5 = "maximal or unable to do because of breathlessness")  Scoring Scores range from 0 to 120.  Minimally important difference is 5 units  CAT: CAT can identify the health impairment of COPD patients and is better correlated with disease progression.  CAT has a scoring range of zero to 40. The CAT score is classified into four groups of low (less than 10), medium (10 - 20), high (21-30) and very high (31-40) based on the impact level of disease on health status. A CAT score over 10 suggests significant symptoms.  A worsening CAT score could be explained by an exacerbation, poor medication adherence, poor  inhaler technique, or progression of COPD or comorbid conditions.  CAT MCID is 2 points  mMRC: mMRC (Modified Medical Research Council) Dyspnea Scale is used to assess the degree  of baseline functional disability in patients of respiratory disease due to dyspnea. No minimal important difference is established. A decrease in score of 1 point or greater is considered a positive change.   Pulmonary Function Assessment:  Pulmonary Function Assessment - 09/28/22 1117       Breath   Shortness of Breath Yes;Limiting activity             Exercise Target Goals: Exercise Program Goal: Individual exercise prescription set using results from initial 6 min walk test and THRR while considering  patient's activity barriers and safety.   Exercise Prescription Goal: Initial exercise prescription builds to 30-45 minutes a day of aerobic activity, 2-3 days per week.  Home exercise guidelines will be given to patient during program as part of exercise prescription that the participant will acknowledge.  Education: Aerobic Exercise: - Group verbal and visual presentation on the components of exercise prescription. Introduces F.I.T.T principle from ACSM for exercise prescriptions.  Reviews F.I.T.T. principles of aerobic exercise including progression. Written material given at graduation. Flowsheet Row Pulmonary Rehab from 09/30/2022 in [facility] Cardiac and Pulmonary Rehab  Education need identified 09/30/22       Education: Resistance Exercise: - Group verbal and visual presentation on the components of exercise prescription. Introduces F.I.T.T principle from ACSM for exercise prescriptions  Reviews F.I.T.T. principles of resistance exercise including progression. Written material given at graduation.    Education: Exercise & Equipment Safety: - Individual verbal instruction and demonstration of equipment use and safety with use of the equipment. Flowsheet Row Pulmonary Rehab from 09/30/2022 in [facility]  Cardiac and Pulmonary Rehab  Date 09/28/22  Educator [facility]  Instruction Review Code 1- Verbalizes Understanding       Education: Exercise Physiology & General Exercise Guidelines: - Group verbal and written instruction with models to review the exercise physiology of the cardiovascular system and associated critical values. Provides general exercise guidelines with specific guidelines to those with heart or lung disease.    Education: Flexibility, Balance, Mind/Body Relaxation: - Group verbal and visual presentation with interactive activity on the components of exercise prescription. Introduces F.I.T.T principle from ACSM for exercise prescriptions. Reviews F.I.T.T. principles of flexibility and balance exercise training including progression. Also discusses the mind body connection.  Reviews various relaxation techniques to help reduce and manage stress (i.e. Deep breathing, progressive muscle relaxation, and visualization). Balance handout provided to take home. Written material given at graduation.   Activity Barriers & Risk Stratification:  Activity Barriers & Cardiac Risk Stratification - 09/30/22 1541       Activity Barriers & Cardiac Risk Stratification   Activity Barriers Joint Problems;Back Problems;Muscular Weakness;Deconditioning;Shortness of Breath;Neck/Spine Problems             6 Minute Walk:  6 Minute Walk     Row Name 09/30/22 1537         6 Minute Walk   Phase Initial     Distance 1730 feet     Walk Time 6 minutes     # of Rest Breaks 0     MPH 3.28     METS 4.24     RPE 13     Perceived Dyspnea  0     VO2 Peak 14.84     Symptoms Yes (comment)     Comments leg tightness     Resting HR 51 bpm     Resting BP 98/52     Resting Oxygen Saturation  97 %     Exercise Oxygen Saturation  during 6 min walk 98 %     Max Ex. HR 84 bpm     Max Ex. BP 142/60     2 Minute Post BP 96/58       Interval HR   1 Minute HR 65     2 Minute HR 78     3 Minute HR 77      4 Minute HR 82     5 Minute HR 82     6 Minute HR 84     2 Minute Post HR 54     Interval Heart Rate? Yes       Interval Oxygen   Interval Oxygen? Yes     Baseline Oxygen Saturation % 97 %     1 Minute Oxygen Saturation % 97 %     1 Minute Liters of Oxygen 0 L  RA     2 Minute Oxygen Saturation % 97 %     2 Minute Liters of Oxygen 0 L  RA     3 Minute Oxygen Saturation % 97 %     3 Minute Liters of Oxygen 0 L  RA     4 Minute Oxygen Saturation % 98 %     4 Minute Liters of Oxygen 0 L  RA     5 Minute Oxygen Saturation % 98 %     5 Minute Liters of Oxygen 0 L  RA     6 Minute Oxygen Saturation % 97 %     6 Minute Liters of Oxygen 0 L  RA     2 Minute Post Oxygen Saturation % 98 %     2 Minute Post Liters of Oxygen 0 L  RA             Oxygen Initial Assessment:  Oxygen Initial Assessment - 09/28/22 1117       Home Oxygen   Home Oxygen Device None    Sleep Oxygen Prescription None    Home Exercise Oxygen Prescription None    Home Resting Oxygen Prescription None      Initial 6 min Walk   Oxygen Used None      Program Oxygen Prescription   Program Oxygen Prescription None      Intervention   Short Term Goals To learn and demonstrate proper use of respiratory medications;To learn and understand importance of maintaining oxygen saturations>88%;To learn and demonstrate proper pursed lip breathing techniques or other breathing techniques. ;To learn and understand importance of monitoring SPO2 with pulse oximeter and demonstrate accurate use of the pulse oximeter.    Long  Term Goals Maintenance of O2 saturations>88%;Compliance with respiratory medication;Demonstrates proper use of MDI's;Exhibits proper breathing techniques, such as pursed lip breathing or other method taught during program session;Verbalizes importance of monitoring SPO2 with pulse oximeter and return demonstration             Oxygen Re-Evaluation:  Oxygen Re-Evaluation     Row Name 10/05/22 1401  10/23/22 1113           Program Oxygen Prescription   Program Oxygen Prescription -- None        Home Oxygen   Home Oxygen Device -- None      Sleep Oxygen Prescription -- None      Home Exercise Oxygen Prescription -- None      Home Resting Oxygen Prescription -- None        Goals/Expected Outcomes   Short Term Goals -- To learn and  understand importance of maintaining oxygen saturations>88%;To learn and demonstrate proper pursed lip breathing techniques or other breathing techniques.       Long  Term Goals -- Exhibits proper breathing techniques, such as pursed lip breathing or other method taught during program session      Comments Reviewed PLB technique with pt.  Talked about how it works and it's importance in maintaining their exercise saturations. Informed patient how to perform the Pursed Lipped breathing technique. Told patient to Inhale through the nose and out the mouth with pursed lips to keep their airways open, help oxygenate them better, practice when at rest or doing strenuous activity. Patient Verbalizes understanding of technique and will work on and be reiterated during [name].      Goals/Expected Outcomes Short: Become more profiecient at using PLB.   Long: Become independent at using PLB. Short: use PLB with exertion. Long: use PLB on exertion proficiently and independently.               Oxygen Discharge (Final Oxygen Re-Evaluation):  Oxygen Re-Evaluation - 10/23/22 1113       Program Oxygen Prescription   Program Oxygen Prescription None      Home Oxygen   Home Oxygen Device None    Sleep Oxygen Prescription None    Home Exercise Oxygen Prescription None    Home Resting Oxygen Prescription None      Goals/Expected Outcomes   Short Term Goals To learn and understand importance of maintaining oxygen saturations>88%;To learn and demonstrate proper pursed lip breathing techniques or other breathing techniques.     Long  Term Goals Exhibits proper  breathing techniques, such as pursed lip breathing or other method taught during program session    Comments Informed patient how to perform the Pursed Lipped breathing technique. Told patient to Inhale through the nose and out the mouth with pursed lips to keep their airways open, help oxygenate them better, practice when at rest or doing strenuous activity. Patient Verbalizes understanding of technique and will work on and be reiterated during [name].    Goals/Expected Outcomes Short: use PLB with exertion. Long: use PLB on exertion proficiently and independently.             Initial Exercise Prescription:  Initial Exercise Prescription - 09/30/22 1500       Date of Initial Exercise RX and Referring Provider   Date 09/30/22    Referring Provider Vito Berger MD      Oxygen   Maintain Oxygen Saturation 88% or higher      Treadmill   MPH 3    Grade 1    Minutes 15    METs 3.71      Recumbant Bike   Level 3    Watts 57    Minutes 15    METs 4.24      NuStep   Level 4    Minutes 15    METs 4.24      Prescription Details   Frequency (times per week) 2    Duration Progress to 30 minutes of continuous aerobic without signs/symptoms of physical distress      Intensity   THRR 40-80% of Max Heartrate 92-133    Ratings of Perceived Exertion 11-13    Perceived Dyspnea 0-4      Progression   Progression Continue to progress workloads to maintain intensity without signs/symptoms of physical distress.      Resistance Training   Training Prescription Yes    Weight 4  lb    Reps 10-15             Perform Capillary Blood Glucose checks as needed.  Exercise Prescription Changes:   Exercise Prescription Changes     Row Name 09/30/22 1500 10/13/22 1400 10/29/22 1300 10/30/22 1100       Response to Exercise   Blood Pressure (Admit) 98/52 108/60 94/60 --    Blood Pressure (Exercise) 142/60 124/68 148/62 --    Blood Pressure (Exit) 96/58 104/62 102/62 --     Heart Rate (Admit) 51 bpm 55 bpm 49 bpm --    Heart Rate (Exercise) 84 bpm 85 bpm 111 bpm --    Heart Rate (Exit) 54 bpm 62 bpm 63 bpm --    Oxygen Saturation (Admit) 97 % 98 % 98 % --    Oxygen Saturation (Exercise) 97 % 92 % 95 % --    Oxygen Saturation (Exit) 98 % 97 % 96 % --    Rating of Perceived Exertion (Exercise) 13 13 14  --    Perceived Dyspnea (Exercise) 0 1 0 --    Symptoms leg tightness slight SOB none --    Comments Results 2nd full day of exercise -- --    Duration -- Continue with 30 min of aerobic exercise without signs/symptoms of physical distress. Continue with 30 min of aerobic exercise without signs/symptoms of physical distress. --    Intensity -- THRR unchanged THRR unchanged --      Progression   Progression -- Continue to progress workloads to maintain intensity without signs/symptoms of physical distress. Continue to progress workloads to maintain intensity without signs/symptoms of physical distress. --    Average METs -- 4.84 5.92 --      Resistance Training   Training Prescription -- Yes Yes --    Weight -- 6 lb 6 lb --    Reps -- 10-15 10-15 --      Interval Training   Interval Training -- No No --      Treadmill   MPH -- 3.8 4 --    Grade -- 1 5 --    Minutes -- 15 30 --    METs -- 4.43 6.82 --      Recumbant Elliptical   Level -- 11 -- --    Minutes -- 15 -- --    METs -- 6.2 -- --      REL-XR   Level -- -- 12 --    Minutes -- -- 15 --    METs -- -- 7.8 --      T5 Nustep   Level -- -- 5 --    Minutes -- -- 15 --    METs -- -- 4 --      Home Exercise Plan   Plans to continue exercise at -- -- -- Home (comment)  walking, working in garden    Frequency -- -- -- Add 2 additional days to program exercise sessions.    Initial Home Exercises Provided -- -- -- 10/30/22      Oxygen   Maintain Oxygen Saturation -- -- 88% or higher --             Exercise Comments:   Exercise Comments     Row Name 10/05/22 1401            Exercise Comments First full day of exercise!  Patient was oriented to gym and equipment including functions, settings, policies, and procedures.  Patient's individual exercise prescription and  treatment plan were reviewed.  All starting workloads were established based on the results of the 6 minute walk test done at initial orientation visit.  The plan for exercise progression was also introduced and progression will be customized based on patient's performance and goals.                Exercise Goals and Review:   Exercise Goals     Row Name 09/30/22 1541             Exercise Goals   Increase Physical Activity Yes       Intervention Provide advice, education, support and counseling about physical activity/exercise needs.;Develop an individualized exercise prescription for aerobic and resistive training based on initial evaluation findings, risk stratification, comorbidities and participant's personal goals.       Expected Outcomes Short Term: Attend rehab on a regular basis to increase amount of physical activity.;Long Term: Add in home exercise to make exercise part of routine and to increase amount of physical activity.;Long Term: Exercising regularly at least 3-5 days a week.       Increase Strength and Stamina Yes       Intervention Provide advice, education, support and counseling about physical activity/exercise needs.;Develop an individualized exercise prescription for aerobic and resistive training based on initial evaluation findings, risk stratification, comorbidities and participant's personal goals.       Expected Outcomes Short Term: Increase workloads from initial exercise prescription for resistance, speed, and METs.;Short Term: Perform resistance training exercises routinely during rehab and add in resistance training at home;Long Term: Improve cardiorespiratory fitness, muscular endurance and strength as measured by increased METs and functional capacity ( )       Able  to understand and use rate of perceived exertion (RPE) scale Yes       Intervention Provide education and explanation on how to use RPE scale       Expected Outcomes Short Term: Able to use RPE daily in rehab to express subjective intensity level;Long Term:  Able to use RPE to guide intensity level when exercising independently       Able to understand and use Dyspnea scale Yes       Intervention Provide education and explanation on how to use Dyspnea scale       Expected Outcomes Short Term: Able to use Dyspnea scale daily in rehab to express subjective sense of shortness of breath during exertion;Long Term: Able to use Dyspnea scale to guide intensity level when exercising independently       Knowledge and understanding of Target Heart Rate Range (THRR) Yes       Intervention Provide education and explanation of THRR including how the numbers were predicted and where they are located for reference       Expected Outcomes Short Term: Able to state/look up THRR;Short Term: Able to use daily as guideline for intensity in rehab;Long Term: Able to use THRR to govern intensity when exercising independently       Able to check pulse independently Yes       Intervention Provide education and demonstration on how to check pulse in carotid and radial arteries.;Review the importance of being able to check your own pulse for safety during independent exercise       Expected Outcomes Short Term: Able to explain why pulse checking is important during independent exercise;Long Term: Able to check pulse independently and accurately       Understanding of Exercise Prescription Yes  Intervention Provide education, explanation, and written materials on patient's individual exercise prescription       Expected Outcomes Short Term: Able to explain program exercise prescription;Long Term: Able to explain home exercise prescription to exercise independently                Exercise Goals Re-Evaluation :   Exercise Goals Re-Evaluation     Row Name 10/05/22 1402 10/13/22 1451 10/29/22 1340 10/30/22 1116       Exercise Goal Re-Evaluation   Exercise Goals Review Increase Physical Activity;Able to understand and use rate of perceived exertion (RPE) scale;Knowledge and understanding of Target Heart Rate Range (THRR);Understanding of Exercise Prescription;Increase Strength and Stamina;Able to check pulse independently Increase Physical Activity;Increase Strength and Stamina;Understanding of Exercise Prescription Increase Physical Activity;Increase Strength and Stamina;Understanding of Exercise Prescription Increase Physical Activity;Increase Strength and Stamina;Able to understand and use rate of perceived exertion (RPE) scale;Able to understand and use Dyspnea scale;Knowledge and understanding of Target Heart Rate Range (THRR);Able to check pulse independently;Understanding of Exercise Prescription    Comments Reviewed RPE scale, THR and program prescription with pt today.  Pt voiced understanding and was given a copy of goals to take home. Nicola Girt is off to a good start with the program. He is already working at pretty significant high loads, around level 11 on the XR and up to 3.8 mph speed on the treadmill, all with appropriate RPEs. We will continue to monitor his progress as he furthers in the program. Cletis Athens is doing well in rehab. He recently increased his overall average MET level to 5.92 METs. He also increased his treadmill workload to 4 mph with an incline of 5%, and he walked at this pace for 30 minutes. He improved to level 12 on the XR and level 5 on the T5 nustep as well. We will continue to monitor his progress in the program. Reviewed home exercise with pt today.  Pt plans to add in some walking in the garden for exercise. Reviewed THR, pulse, RPE, sign and symptoms, pulse oximetery and when to call 911 or MD.  Also discussed weather considerations and indoor options.  Pt voiced understanding. We  talked about the importance of adding in home exerise.    Expected Outcomes Short: Use RPE daily to regulate intensity.  Long: Follow program prescription in THR. Short: Follow current exercise prescription, progress workloads slowly and gradually Long: Increase overall strength and stamina Short: Continue to progressively increase workloads. Long: Continue to improve strength and stamina. Short: Add in some exercise at home Long: Conitnue to improve stamina             Discharge Exercise Prescription (Final Exercise Prescription Changes):  Exercise Prescription Changes - 10/30/22 1100       Home Exercise Plan   Plans to continue exercise at Home (comment)   walking, working in garden   Frequency Add 2 additional days to program exercise sessions.    Initial Home Exercises Provided 10/30/22             Nutrition:  Target Goals: Understanding of nutrition guidelines, daily intake of sodium 1500mg , cholesterol 200mg , calories 30% from fat and 7% or less from saturated fats, daily to have 5 or more servings of fruits and vegetables.  Education: All About Nutrition: -Group instruction provided by verbal, written material, interactive activities, discussions, models, and posters to present general guidelines for heart healthy nutrition including fat, fiber, MyPlate, the role of sodium in heart healthy nutrition, utilization of the nutrition  label, and utilization of this knowledge for meal planning. Follow up email sent as well. Written material given at graduation.   Biometrics:  Pre Biometrics - 09/30/22 1542       Pre Biometrics   Height 6' 2.25" (1.886 m)    Weight 176 lb 14.4 oz (80.2 kg)    Waist Circumference 34 inches    Hip Circumference 40 inches    Waist to Hip Ratio 0.85 %    BMI (Calculated) 22.56    Single Leg Stand 30 seconds              Nutrition Therapy Plan and Nutrition Goals:  Nutrition Therapy & Goals - 09/30/22 1520       Intervention Plan    Intervention Prescribe, educate and counsel regarding individualized specific dietary modifications aiming towards targeted core components such as weight, hypertension, lipid management, diabetes, heart failure and other comorbidities.    Expected Outcomes Short Term Goal: Understand basic principles of dietary content, such as calories, fat, sodium, cholesterol and nutrients.;Short Term Goal: A plan has been developed with personal nutrition goals set during dietitian appointment.;Long Term Goal: Adherence to prescribed nutrition plan.             Nutrition Assessments:  MEDIFICTS Score Key: ?70 Need to make dietary changes  40-70 Heart Healthy Diet ? 40 Therapeutic Level Cholesterol Diet  Flowsheet Row Pulmonary Rehab from 09/30/2022 in [facility] Cardiac and Pulmonary Rehab  Picture Your Plate Total Score on Admission 53      Picture Your Plate Scores: <16 Unhealthy dietary pattern with much room for improvement. 41-50 Dietary pattern unlikely to meet recommendations for good health and room for improvement. 51-60 More healthful dietary pattern, with some room for improvement.  >60 Healthy dietary pattern, although there may be some specific behaviors that could be improved.   Nutrition Goals Re-Evaluation:  Nutrition Goals Re-Evaluation     Row Name 10/23/22 1120             Goals   Current Weight 181 lb (82.1 kg)       Nutrition Goal Eat smaller portions.       Comment Patient was informed on why it is important to maintain a balanced diet when dealing with Respiratory issues. Explained that it takes a lot of energy to breath and when they are short of breath often they will need to have a good diet to help keep up with the calories they are expending for breathing.       Expected Outcome Short: Choose and plan snacks accordingly to patients caloric intake to improve breathing. Long: Maintain a diet independently that meets their caloric intake to aid in daily shortness of  breath.                Nutrition Goals Discharge (Final Nutrition Goals Re-Evaluation):  Nutrition Goals Re-Evaluation - 10/23/22 1120       Goals   Current Weight 181 lb (82.1 kg)    Nutrition Goal Eat smaller portions.    Comment Patient was informed on why it is important to maintain a balanced diet when dealing with Respiratory issues. Explained that it takes a lot of energy to breath and when they are short of breath often they will need to have a good diet to help keep up with the calories they are expending for breathing.    Expected Outcome Short: Choose and plan snacks accordingly to patients caloric intake to improve breathing. Long: Maintain a diet  independently that meets their caloric intake to aid in daily shortness of breath.             Psychosocial: Target Goals: Acknowledge presence or absence of significant depression and/or stress, maximize coping skills, provide positive support system. Participant is able to verbalize types and ability to use techniques and skills needed for reducing stress and depression.   Education: Stress, Anxiety, and Depression - Group verbal and visual presentation to define topics covered.  Reviews how body is impacted by stress, anxiety, and depression.  Also discusses healthy ways to reduce stress and to treat/manage anxiety and depression.  Written material given at graduation.   Education: Sleep Hygiene -Provides group verbal and written instruction about how sleep can affect your health.  Define sleep hygiene, discuss sleep cycles and impact of sleep habits. Review good sleep hygiene tips.    Initial Review & Psychosocial Screening:  Initial Psych Review & Screening - 09/28/22 1120       Initial Review   Current issues with Current Stress Concerns    Comments He can look to his Wife, kids and grandkids for support. He is having more shortness of breath than usual and wants to get that under control. He has done with Cardiac  Rehab program in 2020.      Family Dynamics   Good Support System? Yes      Barriers   Psychosocial barriers to participate in program The patient should benefit from training in stress management and relaxation.      Screening Interventions   Interventions Encouraged to exercise;To provide support and resources with identified psychosocial needs;Program counselor consult;Provide feedback about the scores to participant    Expected Outcomes Short Term goal: Utilizing psychosocial counselor, staff and physician to assist with identification of specific Stressors or current issues interfering with healing process. Setting desired goal for each stressor or current issue identified.;Long Term Goal: Stressors or current issues are controlled or eliminated.;Short Term goal: Identification and review with participant of any Quality of Life or Depression concerns found by scoring the questionnaire.;Long Term goal: The participant improves quality of Life and PHQ9 Scores as seen by post scores and/or verbalization of changes             Quality of Life Scores:  Scores of 19 and below usually indicate a poorer quality of life in these areas.  A difference of  2-3 points is a clinically meaningful difference.  A difference of 2-3 points in the total score of the Quality of Life Index has been associated with significant improvement in overall quality of life, self-image, physical symptoms, and general health in studies assessing change in quality of life.  PHQ-9: Review Flowsheet       10/23/2022 09/30/2022 10/23/2019 05/01/2019  Depression screen PHQ 2/9  Decreased Interest 0 0 0 0  Down, Depressed, Hopeless 0 0 0 0  PHQ - 2 Score 0 0 0 0  Altered sleeping 0 0 0 0  Tired, decreased energy 3 3 1 1   Change in appetite 0 2 0 1  Feeling bad or failure about yourself  0 0 0 0  Trouble concentrating 0 0 0 0  Moving slowly or fidgety/restless 0 0 0 0  Suicidal thoughts 0 0 0 0  PHQ-9 Score 3 5 1 2    Difficult doing work/chores Somewhat difficult Very difficult Somewhat difficult Somewhat difficult   Interpretation of Total Score  Total Score Depression Severity:  1-4 = Minimal depression, 5-9 = Mild  depression, 10-14 = Moderate depression, 15-19 = Moderately severe depression, 20-27 = Severe depression   Psychosocial Evaluation and Intervention:  Psychosocial Evaluation - 09/28/22 1121       Psychosocial Evaluation & Interventions   Interventions Encouraged to exercise with the program and follow exercise prescription;Relaxation education;Stress management education    Comments He can look to his Wife, kids and grandkids for support. He is having more shortness of breath than usual and wants to get that under control. He has done with Cardiac Rehab program in 2020.    Expected Outcomes Short: Start [name] to help with mood. Long: Maintain a healthy mental state.    Continue Psychosocial Services  Follow up required by staff             Psychosocial Re-Evaluation:  Psychosocial Re-Evaluation     Row Name 10/23/22 1117             Psychosocial Re-Evaluation   Current issues with Current Stress Concerns       Comments Reviewed patient health questionnaire (PHQ-9) with patient for follow up. Previously, patients score indicated signs/symptoms of depression.  Reviewed to see if patient is improving symptom wise while in program.  Score improved and patient states that it is because he has been able to increase his exercise level.       Expected Outcomes Short: Continue to attend [name] regularly for regular exercise and social engagement. Long: Continue to improve symptoms and manage a positive mental state.       Interventions Encouraged to attend Pulmonary Rehabilitation for the exercise       Continue Psychosocial Services  Follow up required by staff                Psychosocial Discharge (Final Psychosocial Re-Evaluation):  Psychosocial Re-Evaluation -  10/23/22 1117       Psychosocial Re-Evaluation   Current issues with Current Stress Concerns    Comments Reviewed patient health questionnaire (PHQ-9) with patient for follow up. Previously, patients score indicated signs/symptoms of depression.  Reviewed to see if patient is improving symptom wise while in program.  Score improved and patient states that it is because he has been able to increase his exercise level.    Expected Outcomes Short: Continue to attend [name] regularly for regular exercise and social engagement. Long: Continue to improve symptoms and manage a positive mental state.    Interventions Encouraged to attend Pulmonary Rehabilitation for the exercise    Continue Psychosocial Services  Follow up required by staff             Education: Education Goals: Education classes will be provided on a weekly basis, covering required topics. Participant will state understanding/return demonstration of topics presented.  Learning Barriers/Preferences:  Learning Barriers/Preferences - 09/28/22 1116       Learning Barriers/Preferences   Learning Barriers None    Learning Preferences Individual Instruction             General Pulmonary Education Topics:  Infection Prevention: - Provides verbal and written material to individual with discussion of infection control including proper hand washing and proper equipment cleaning during exercise session. Flowsheet Row Pulmonary Rehab from 09/30/2022 in [facility] Cardiac and Pulmonary Rehab  Date 09/28/22  Educator [facility]  Instruction Review Code 1- Verbalizes Understanding       Falls Prevention: - Provides verbal and written material to individual with discussion of falls prevention and safety. Flowsheet Row Pulmonary Rehab from 09/30/2022 in [facility] Cardiac and Pulmonary  Rehab  Date 09/28/22  Educator [facility] Fall  Instruction Review Code 1- Verbalizes Understanding       Chronic Lung Disease Review: - Group verbal instruction with  posters, models, PowerPoint presentations and videos,  to review new updates, new respiratory medications, new advancements in procedures and treatments. Providing information on websites and "800" numbers for continued self-education. Includes information about supplement oxygen, available portable oxygen systems, continuous and intermittent flow rates, oxygen safety, concentrators, and Medicare reimbursement for oxygen. Explanation of Pulmonary Drugs, including class, frequency, complications, importance of spacers, rinsing mouth after steroid MDI's, and proper cleaning methods for nebulizers. Review of basic lung anatomy and physiology related to function, structure, and complications of lung disease. Review of risk factors. Discussion about methods for diagnosing sleep apnea and types of masks and machines for OSA. Includes a review of the use of types of environmental controls: home humidity, furnaces, filters, dust mite/pet prevention, HEPA vacuums. Discussion about weather changes, air quality and the benefits of nasal washing. Instruction on Warning signs, infection symptoms, calling MD promptly, preventive modes, and value of vaccinations. Review of effective airway clearance, coughing and/or vibration techniques. Emphasizing that all should Create an Action Plan. Written material given at graduation.   AED/CPR: - Group verbal and written instruction with the use of models to demonstrate the basic use of the AED with the basic ABC's of resuscitation.    Anatomy and Cardiac Procedures: - Group verbal and visual presentation and models provide information about basic cardiac anatomy and function. Reviews the testing methods done to diagnose heart disease and the outcomes of the test results. Describes the treatment choices: Medical Management, Angioplasty, or Coronary Bypass Surgery for treating various heart conditions including Myocardial Infarction, Angina, Valve Disease, and Cardiac Arrhythmias.   Written material given at graduation.   Medication Safety: - Group verbal and visual instruction to review commonly prescribed medications for heart and lung disease. Reviews the medication, class of the drug, and side effects. Includes the steps to properly store meds and maintain the prescription regimen.  Written material given at graduation.   Other: -Provides group and verbal instruction on various topics (see comments)   Knowledge Questionnaire Score:  Knowledge Questionnaire Score - 09/30/22 1520       Knowledge Questionnaire Score   Pre Score 15/18              Core Components/Risk Factors/Patient Goals at Admission:  Personal Goals and Risk Factors at Admission - 09/28/22 1118       Core Components/Risk Factors/Patient Goals on Admission    Weight Management Yes;Weight Maintenance;Weight Gain    Intervention Weight Management: Develop a combined nutrition and exercise program designed to reach desired caloric intake, while maintaining appropriate intake of nutrient and fiber, sodium and fats, and appropriate energy expenditure required for the weight goal.;Weight Management: Provide education and appropriate resources to help participant work on and attain dietary goals.;Weight Management/Obesity: Establish reasonable short term and long term weight goals.    Expected Outcomes Short Term: Continue to assess and modify interventions until short term weight is achieved;Long Term: Adherence to nutrition and physical activity/exercise program aimed toward attainment of established weight goal;Weight Maintenance: Understanding of the daily nutrition guidelines, which includes 25-35% calories from fat, 7% or less cal from saturated fats, less than 200mg  cholesterol, less than 1.5gm of sodium, & 5 or more servings of fruits and vegetables daily;Understanding recommendations for meals to include 15-35% energy as protein, 25-35% energy from fat, 35-60% energy from carbohydrates, less  than 200mg  of dietary cholesterol, 20-35 gm of total fiber daily;Understanding of distribution of calorie intake throughout the day with the consumption of 4-5 meals/snacks;Weight Gain: Understanding of general recommendations for a high calorie, high protein meal plan that promotes weight gain by distributing calorie intake throughout the day with the consumption for 4-5 meals, snacks, and/or supplements    Tobacco Cessation Yes    Intervention Assist the participant in steps to quit. Provide individualized education and counseling about committing to Tobacco Cessation, relapse prevention, and pharmacological support that can be provided by physician.;Education officer, environmental, assist with locating and accessing local/national Quit Smoking programs, and support quit date choice.    Expected Outcomes Short Term: Will demonstrate readiness to quit, by selecting a quit date.;Short Term: Will quit all tobacco product use, adhering to prevention of relapse plan.;Long Term: Complete abstinence from all tobacco products for at least 12 months from quit date.    Improve shortness of breath with ADL's Yes    Intervention Provide education, individualized exercise plan and daily activity instruction to help decrease symptoms of SOB with activities of daily living.    Expected Outcomes Short Term: Improve cardiorespiratory fitness to achieve a reduction of symptoms when performing ADLs;Long Term: Be able to perform more ADLs without symptoms or delay the onset of symptoms    Hypertension Yes    Intervention Provide education on lifestyle modifcations including regular physical activity/exercise, weight management, moderate sodium restriction and increased consumption of fresh fruit, vegetables, and low fat dairy, alcohol moderation, and smoking cessation.;Monitor prescription use compliance.    Expected Outcomes Short Term: Continued assessment and intervention until BP is < 140/21mm HG in hypertensive  participants. < 130/50mm HG in hypertensive participants with diabetes, heart failure or chronic kidney disease.;Long Term: Maintenance of blood pressure at goal levels.    Lipids Yes    Intervention Provide education and support for participant on nutrition & aerobic/resistive exercise along with prescribed medications to achieve LDL 70mg , HDL >40mg .    Expected Outcomes Short Term: Participant states understanding of desired cholesterol values and is compliant with medications prescribed. Participant is following exercise prescription and nutrition guidelines.;Long Term: Cholesterol controlled with medications as prescribed, with individualized exercise RX and with personalized nutrition plan. Value goals: LDL < 70mg , HDL > 40 mg.             Education:Diabetes - Individual verbal and written instruction to review signs/symptoms of diabetes, desired ranges of glucose level fasting, after meals and with exercise. Acknowledge that pre and post exercise glucose checks will be done for 3 sessions at entry of program.   Know Your Numbers and Heart Failure: - Group verbal and visual instruction to discuss disease risk factors for cardiac and pulmonary disease and treatment options.  Reviews associated critical values for Overweight/Obesity, Hypertension, Cholesterol, and Diabetes.  Discusses basics of heart failure: signs/symptoms and treatments.  Introduces Heart Failure Zone chart for action plan for heart failure.  Written material given at graduation. Flowsheet Row Pulmonary Rehab from 09/30/2022 in [facility] Cardiac and Pulmonary Rehab  Education need identified 09/30/22       Core Components/Risk Factors/Patient Goals Review:   Goals and Risk Factor Review     Row Name 10/23/22 1115             Core Components/Risk Factors/Patient Goals Review   Personal Goals Review Improve shortness of breath with ADL's       Review Spoke to patient about their shortness of breath and what they  can  do to improve. Patient has been informed of breathing techniques when starting the program. Patient is informed to tell staff if they have had any med changes and that certain meds they are taking or not taking can be causing shortness of breath.       Expected Outcomes Short: Attend [name] regularly to improve shortness of breath with ADL's. Long: maintain independence with ADL's                Core Components/Risk Factors/Patient Goals at Discharge (Final Review):   Goals and Risk Factor Review - 10/23/22 1115       Core Components/Risk Factors/Patient Goals Review   Personal Goals Review Improve shortness of breath with ADL's    Review Spoke to patient about their shortness of breath and what they can do to improve. Patient has been informed of breathing techniques when starting the program. Patient is informed to tell staff if they have had any med changes and that certain meds they are taking or not taking can be causing shortness of breath.    Expected Outcomes Short: Attend [name] regularly to improve shortness of breath with ADL's. Long: maintain independence with ADL's             ITP Comments:  ITP Comments     Row Name 09/28/22 1115 09/30/22 1516 10/05/22 1401 10/07/22 0825 11/04/22 0928   ITP Comments Virtual Visit completed. Patient informed on EP and RD appointment and 6 Minute walk test. Patient also informed of patient health questionnaires on My Chart. Patient Verbalizes understanding. Visit diagnosis can be found in [facility] media,patient is [place]. Completed and gym orientation. Initial ITP created and sent for review to Dr. Vida Rigger, Medical Director. First full day of exercise!  Patient was oriented to gym and equipment including functions, settings, policies, and procedures.  Patient's individual exercise prescription and treatment plan were reviewed.  All starting workloads were established based on the results of the 6 minute walk test done at initial  orientation visit.  The plan for exercise progression was also introduced and progression will be customized based on patient's performance and goals. 30 Day review completed. Medical Director ITP review done, changes made as directed, and signed approval by Medical Director.     new to program 30 day review completed. ITP sent to Dr. Jinny Sanders, Medical Director of  Pulmonary Rehab. Continue with ITP unless changes are made by physician.            Comments: 30 day review

## 2022-11-05 ENCOUNTER — Encounter
Admission: RE | Admit: 2022-11-05 | Discharge: 2022-11-05 | Disposition: A | Discharge status: Home / Self Care | Payer: Medicare PPO | Source: Ambulatory Visit | Attending: Internal Medicine | Admitting: Internal Medicine

## 2022-11-05 DIAGNOSIS — I25119 Atherosclerotic heart disease of native coronary artery with unspecified angina pectoris: Secondary | ICD-10-CM | POA: Diagnosis not present

## 2022-11-05 DIAGNOSIS — I5022 Chronic systolic (congestive) heart failure: Secondary | ICD-10-CM | POA: Insufficient documentation

## 2022-11-05 LAB — NM MYOCAR MULTI W/SPECT W/WALL MOTION / EF
LV dias vol: 98 mL (ref 62–150)
LV sys vol: 43 mL
Nuc Stress EF: 56 %
Peak HR: 77 {beats}/min
Percent HR: 50 %
Rest HR: 43 {beats}/min
Rest Nuclear Isotope Dose: 10.8 mCi
SDS: 1
SRS: 1
SSS: 0
ST Depression (mm): 0 mm
Stress Nuclear Isotope Dose: 31.7 mCi
TID: 1.2

## 2022-11-05 MED ORDER — TECHNETIUM TC 99M TETROFOSMIN IV KIT
10.8300 | PACK | Freq: Once | INTRAVENOUS | Status: AC | PRN
  Administered 2022-11-05: 10.83 via INTRAVENOUS

## 2022-11-05 MED ORDER — TECHNETIUM TC 99M TETROFOSMIN IV KIT
31.6900 | PACK | Freq: Once | INTRAVENOUS | Status: AC | PRN
  Administered 2022-11-05: 31.69 via INTRAVENOUS

## 2022-11-05 MED ORDER — REGADENOSON 0.4 MG/5ML IV SOLN
0.4000 mg | Freq: Once | INTRAVENOUS | Status: AC
  Administered 2022-11-05: 0.4 mg via INTRAVENOUS

## 2022-11-06 DIAGNOSIS — R0609 Other forms of dyspnea: Secondary | ICD-10-CM

## 2022-11-06 NOTE — Progress Notes (Signed)
Daily Session Note  Patient Details  Name: Brian Mcclure MRN: [id number] Date of Birth: 12-04-1955 Referring Provider:   Flowsheet Row Pulmonary Rehab from 09/30/2022 in [facility] Cardiac and Pulmonary Rehab  Referring Provider Vito Berger MD       Encounter Date: 11/06/2022  Check In:  Session Check In - 11/06/22 1128       Check-In   Supervising physician immediately available to respond to emergencies See telemetry face sheet for immediately available ER MD    Location [facility]-Cardiac & Pulmonary Rehab    Staff Present Harlene Ramus, RN, BSN;Joseph [place], RCP,RRT,BSRT;Jessica Melvin, [place], RCEP, CCRP, CCET    Virtual Visit No    Medication changes reported     No    Fall or balance concerns reported    No    Tobacco Cessation No Change    Warm-up and Cool-down Performed on first and last piece of equipment    Resistance Training Performed Yes    VAD Patient? No    PAD/SET Patient? No      Pain Assessment   Currently in Pain? No/denies                Social History   Tobacco Use  Smoking Status Every Day   Packs/day: 1.00   Years: 40.00   Additional pack years: 0.00   Total pack years: 40.00   Types: Cigarettes  Smokeless Tobacco Never    Goals Met:  Independence with exercise equipment Exercise tolerated well No report of concerns or symptoms today Strength training completed today  Goals Unmet:  Not Applicable  Comments: Pt able to follow exercise prescription today without complaint.  Will continue to monitor for progression.   Dr. Bethann Punches is Medical Director for [facility] Cardiac Rehabilitation.  Dr. Vida Rigger is Medical Director for [facility] Pulmonary Rehabilitation.

## 2022-11-09 ENCOUNTER — Telehealth: Payer: Self-pay | Admitting: Internal Medicine

## 2022-11-09 ENCOUNTER — Encounter: Payer: Medicare PPO | Admitting: *Deleted

## 2022-11-09 DIAGNOSIS — R0609 Other forms of dyspnea: Secondary | ICD-10-CM

## 2022-11-09 NOTE — Telephone Encounter (Signed)
Left voicemail to reschedule ECHO

## 2022-11-09 NOTE — Progress Notes (Signed)
Daily Session Note  Patient Details  Name: Brian Mcclure MRN: [id number] Date of Birth: 11-25-1955 Referring Provider:   Flowsheet Row Pulmonary Rehab from 09/30/2022 in [facility] Cardiac and Pulmonary Rehab  Referring Provider Vito Berger MD       Encounter Date: 11/09/2022  Check In:  Session Check In - 11/09/22 1105       Check-In   Supervising physician immediately available to respond to emergencies See telemetry face sheet for immediately available ER MD    Location [facility]-Cardiac & Pulmonary Rehab    Staff Present Lanny Hurst, RN, Franki Monte, BS, ACSM CEP, Exercise Physiologist;Noah Tickle, BS, Exercise Physiologist    Virtual Visit No    Medication changes reported     No    Fall or balance concerns reported    No    Tobacco Cessation No Change    Warm-up and Cool-down Performed on first and last piece of equipment    Resistance Training Performed Yes    VAD Patient? No    PAD/SET Patient? No      Pain Assessment   Currently in Pain? No/denies                Social History   Tobacco Use  Smoking Status Every Day   Packs/day: 1.00   Years: 40.00   Additional pack years: 0.00   Total pack years: 40.00   Types: Cigarettes  Smokeless Tobacco Never    Goals Met:  Independence with exercise equipment Exercise tolerated well No report of concerns or symptoms today Strength training completed today  Goals Unmet:  Not Applicable  Comments: Pt able to follow exercise prescription today without complaint.  Will continue to monitor for progression.    Dr. Bethann Punches is Medical Director for [facility] Cardiac Rehabilitation.  Dr. Vida Rigger is Medical Director for [facility] Pulmonary Rehabilitation.

## 2022-11-09 NOTE — Telephone Encounter (Signed)
-----   Message from Parke Poisson, RN sent at 11/09/2022  9:19 AM EDT ----- Pt called to see if he could move up his ECHO.  Thanks

## 2022-11-12 ENCOUNTER — Ambulatory Visit: Payer: Medicare PPO | Attending: Internal Medicine

## 2022-11-12 DIAGNOSIS — I25119 Atherosclerotic heart disease of native coronary artery with unspecified angina pectoris: Secondary | ICD-10-CM | POA: Diagnosis not present

## 2022-11-12 DIAGNOSIS — I5022 Chronic systolic (congestive) heart failure: Secondary | ICD-10-CM | POA: Diagnosis not present

## 2022-11-12 LAB — ECHOCARDIOGRAM COMPLETE
AR max vel: 3.12 cm2
AV Area VTI: 3.24 cm2
AV Area mean vel: 2.79 cm2
AV Mean grad: 2 mmHg
AV Peak grad: 3.4 mmHg
Ao pk vel: 0.93 m/s
Area-P 1/2: 3.53 cm2
Calc EF: 67.3 %
S' Lateral: 3.5 cm
Single Plane A2C EF: 63.1 %
Single Plane A4C EF: 71.3 %

## 2022-11-23 ENCOUNTER — Encounter: Payer: Medicare PPO | Attending: Registered Nurse | Admitting: *Deleted

## 2022-11-23 DIAGNOSIS — R0609 Other forms of dyspnea: Secondary | ICD-10-CM | POA: Diagnosis present

## 2022-11-23 NOTE — Progress Notes (Signed)
Daily Session Note  Patient Details  Name: Brian Mcclure MRN: [id number] Date of Birth: 03/25/1956 Referring Provider:   Flowsheet Row Pulmonary Rehab from 09/30/2022 in [facility] Cardiac and Pulmonary Rehab  Referring Provider Vito Berger MD       Encounter Date: 11/23/2022  Check In:  Session Check In - 11/23/22 1006       Check-In   Supervising physician immediately available to respond to emergencies See telemetry face sheet for immediately available ER MD    Location [facility]-Cardiac & Pulmonary Rehab    Staff Present Lanny Hurst, RN, Franki Monte, BS, ACSM CEP, Exercise Physiologist;Noah Tickle, BS, Exercise Physiologist    Virtual Visit No    Medication changes reported     No    Fall or balance concerns reported    No    Tobacco Cessation No Change    Warm-up and Cool-down Performed on first and last piece of equipment    Resistance Training Performed Yes    VAD Patient? No    PAD/SET Patient? No      Pain Assessment   Currently in Pain? No/denies                Social History   Tobacco Use  Smoking Status Every Day   Packs/day: 1.00   Years: 40.00   Additional pack years: 0.00   Total pack years: 40.00   Types: Cigarettes  Smokeless Tobacco Never    Goals Met:  Independence with exercise equipment Exercise tolerated well No report of concerns or symptoms today Strength training completed today  Goals Unmet:  Not Applicable  Comments: Pt able to follow exercise prescription today without complaint.  Will continue to monitor for progression.    Dr. Bethann Punches is Medical Director for [facility] Cardiac Rehabilitation.  Dr. Vida Rigger is Medical Director for [facility] Pulmonary Rehabilitation.

## 2022-11-27 ENCOUNTER — Encounter: Payer: Medicare PPO | Admitting: *Deleted

## 2022-11-27 DIAGNOSIS — R0609 Other forms of dyspnea: Secondary | ICD-10-CM | POA: Diagnosis not present

## 2022-11-27 NOTE — Progress Notes (Signed)
Daily Session Note  Patient Details  Name: KWELI VANDEMARK MRN: [id number] Date of Birth: 10-Jun-1956 Referring Provider:   Flowsheet Row Pulmonary Rehab from 09/30/2022 in [facility] Cardiac and Pulmonary Rehab  Referring Provider Vito Berger MD       Encounter Date: 11/27/2022  Check In:  Session Check In - 11/27/22 1145       Check-In   Supervising physician immediately available to respond to emergencies See telemetry face sheet for immediately available ER MD    Staff Present Cora Collum, RN, BSN, CCRP;Jessica Cochiti, MA, RCEP, CCRP, CCET;Joseph [place], [place]    Virtual Visit No    Medication changes reported     No    Fall or balance concerns reported    No    Warm-up and Cool-down Performed on first and last piece of equipment    Resistance Training Performed Yes    VAD Patient? No    PAD/SET Patient? No      Pain Assessment   Currently in Pain? No/denies                Social History   Tobacco Use  Smoking Status Every Day   Packs/day: 1.00   Years: 40.00   Additional pack years: 0.00   Total pack years: 40.00   Types: Cigarettes  Smokeless Tobacco Never    Goals Met:  Proper associated with RPD/PD & O2 Sat Independence with exercise equipment Exercise tolerated well No report of concerns or symptoms today  Goals Unmet:  Not Applicable  Comments: Pt able to follow exercise prescription today without complaint.  Will continue to monitor for progression.    Dr. Bethann Punches is Medical Director for [facility] Cardiac Rehabilitation.  Dr. Vida Rigger is Medical Director for [facility] Pulmonary Rehabilitation.

## 2022-11-30 ENCOUNTER — Encounter: Payer: Medicare PPO | Admitting: *Deleted

## 2022-11-30 DIAGNOSIS — R0609 Other forms of dyspnea: Secondary | ICD-10-CM

## 2022-11-30 NOTE — Progress Notes (Signed)
Daily Session Note  Patient Details  Name: Brian Mcclure MRN: [id number] Date of Birth: Nov 28, 1955 Referring Provider:   Flowsheet Row Pulmonary Rehab from 09/30/2022 in [facility] Cardiac and Pulmonary Rehab  Referring Provider Vito Berger MD       Encounter Date: 11/30/2022  Check In:  Session Check In - 11/30/22 1111       Check-In   Supervising physician immediately available to respond to emergencies See telemetry face sheet for immediately available ER MD    Location [facility]-Cardiac & Pulmonary Rehab    Staff Present Lanny Hurst, RN, Franki Monte, BS, ACSM CEP, Exercise Physiologist;Meredith Jewel Baize, RN BSN;Noah Tickle, BS, Exercise Physiologist    Virtual Visit No    Medication changes reported     No    Fall or balance concerns reported    No    Tobacco Cessation No Change    Warm-up and Cool-down Performed on first and last piece of equipment    Resistance Training Performed Yes    VAD Patient? No    PAD/SET Patient? No      Pain Assessment   Currently in Pain? No/denies                Social History   Tobacco Use  Smoking Status Every Day   Packs/day: 1.00   Years: 40.00   Additional pack years: 0.00   Total pack years: 40.00   Types: Cigarettes  Smokeless Tobacco Never    Goals Met:  Independence with exercise equipment Exercise tolerated well No report of concerns or symptoms today Strength training completed today  Goals Unmet:  Not Applicable  Comments: Pt able to follow exercise prescription today without complaint.  Will continue to monitor for progression.    Dr. Bethann Punches is Medical Director for [facility] Cardiac Rehabilitation.  Dr. Vida Rigger is Medical Director for [facility] Pulmonary Rehabilitation.

## 2022-12-02 ENCOUNTER — Ambulatory Visit (INDEPENDENT_AMBULATORY_CARE_PROVIDER_SITE_OTHER): Payer: Medicare PPO

## 2022-12-02 ENCOUNTER — Ambulatory Visit: Payer: Medicare PPO | Attending: Internal Medicine

## 2022-12-02 ENCOUNTER — Encounter: Payer: Self-pay | Admitting: *Deleted

## 2022-12-02 ENCOUNTER — Other Ambulatory Visit: Payer: Medicare PPO

## 2022-12-02 DIAGNOSIS — Z95828 Presence of other vascular implants and grafts: Secondary | ICD-10-CM | POA: Diagnosis not present

## 2022-12-02 DIAGNOSIS — I739 Peripheral vascular disease, unspecified: Secondary | ICD-10-CM

## 2022-12-02 DIAGNOSIS — R0609 Other forms of dyspnea: Secondary | ICD-10-CM

## 2022-12-02 LAB — VAS US ABI WITH/WO TBI
Left ABI: 1.02
Right ABI: 1.24

## 2022-12-02 NOTE — Progress Notes (Signed)
Pulmonary Individual Treatment Plan  Patient Details  Name: AMAY SCHILTZ MRN: [id number] Date of Birth: 1956-02-26 Referring Provider:   Flowsheet Row Pulmonary Rehab from 09/30/2022 in [facility] Cardiac and Pulmonary Rehab  Referring Provider Vito Berger MD       Initial Encounter Date:  Flowsheet Row Pulmonary Rehab from 09/30/2022 in [facility] Cardiac and Pulmonary Rehab  Date 09/30/22       Visit Diagnosis: Dyspnea on exertion  Patient's Home Medications on Admission:  Current Outpatient Medications:    albuterol (VENTOLIN HFA) 108 (90 Base) MCG/ACT inhaler, INHALE 2 PUFFS BY ORAL INHALATION EVERY 4 TO 6 HOURS AS NEEDED FOR CHRONIC OBSTRUCTIVE LUNG DISEASE BE SURE TO WASH MOUTHPIECE WITH WARM WATER ONCE A WEEK (Patient not taking: Reported on 10/28/2022), Disp: , Rfl:    apixaban (ELIQUIS) 5 MG TABS tablet, TAKE ONE TABLET BY MOUTH EVERY 12 HOURS CAUTION BLOOD THINNER, Disp: , Rfl:    aspirin 81 MG chewable tablet, CHEW ONE TABLET BY MOUTH EVERY DAY, Disp: , Rfl:    Ca Carbonate-Mag Hydroxide (ROLAIDS PO), Take 1 tablet by mouth at bedtime as needed (heartburn/acid reflux)., Disp: , Rfl:    cholecalciferol (VITAMIN D) 25 MCG (1000 UT) tablet, Take 1,000 Units by mouth 2 (two) times daily., Disp: , Rfl:    diclofenac sodium (VOLTAREN) 1 % GEL, Apply 1 application  topically daily as needed (pain)., Disp: , Rfl:    famotidine (PEPCID) 20 MG tablet, Take 20 mg by mouth daily., Disp: , Rfl:    hydrocortisone cream 1 %, SMARTSIG:1 Topical Daily, Disp: , Rfl:    loratadine (CLARITIN) 10 MG tablet, TAKE ONE TABLET BY MOUTH EVERY DAY FOR ALLERGIES, Disp: , Rfl:    losartan (COZAAR) 25 MG tablet, Take 0.5 tablets (12.5 mg total) by mouth daily., Disp: 15 tablet, Rfl: 1   metoprolol succinate (TOPROL XL) 25 MG 24 hr tablet, Take 0.5 tablets (12.5 mg total) by mouth daily., Disp: 45 tablet, Rfl: 3   nitroGLYCERIN (NITROSTAT) 0.4 MG SL tablet, Place 1 tablet (0.4 mg total) under the tongue  every 5 (five) minutes x 3 doses as needed for chest pain., Disp: 25 tablet, Rfl: 12   oxymetazoline (AFRIN) 0.05 % nasal spray, Place 1 spray into both nostrils 2 (two) times daily as needed for congestion., Disp: , Rfl:    rosuvastatin (CRESTOR) 20 MG tablet, TAKE ONE TABLET BY MOUTH AT BEDTIME FOR CHOLESTEROL, Disp: , Rfl:    Simethicone (GAS-X PO), Take 1 tablet by mouth 3 (three) times daily as needed (heartburn)., Disp: , Rfl:    simethicone (MYLICON) 80 MG chewable tablet, CHEW ONE TABLET BY MOUTH TWO TIMES A DAY, Disp: , Rfl:    STIOLTO RESPIMAT 2.5-2.5 MCG/ACT AERS, INHALE 2 INHALATIONS BY ORAL INHALATION IN THE MORNING FOR CHRONIC OBSTRUCTIVE LUNGS, Disp: , Rfl:   Past Medical History: Past Medical History:  Diagnosis Date   Actinic keratosis    Acute ST elevation myocardial infarction (STEMI) of anterior wall (HCC)    a. 02/2019 s/p PCI/DES ->LAD.   Apical mural thrombus following MI (HCC)    a. 02/2019 s/p Ant MI-->Echo: Small, fixed thrombus on the apical wall of LV-->coumadin.   Barrett's esophagus    CAD (coronary artery disease)    a. 02/2019 Ant STEMI/PCI: LM nl, LAD 100p (4x18 Resolute Onyx DES), LCX/RCA min irregs, EF 35-45%.   Chronic back pain    Colon polyps    a. 07/2017 Colonoscopy: 5mm sigmoid polyp, tubular adenoma, diverticulosis, int  hemorrhoids.   COPD (chronic obstructive pulmonary disease) (HCC)    Degeneration of lumbar intervertebral disc    Dysphonia    GERD (gastroesophageal reflux disease)    HFrEF (heart failure with reduced ejection fraction) (HCC)    a. 02/2019 Echo: EF 40-45%, apical, septal, inf, and inforlateral AK. Mid and apical anterior/mid anteroseptal AK. Mildly dil LA.   Hydrocele    Hyperlipidemia    Ischemic cardiomyopathy    a. 02/2019 Echo: EF 40-45%.   Myalgia    Osteoarthritis    C-spine   PAF (paroxysmal atrial fibrillation) (HCC)    a. CHA2DS2VASc = 2.   Peripheral vascular disease (HCC)    a. 2018 s/p aorto-iliac stent graft.    Reactive airway disease    Seborrheic keratosis    Sinus bradycardia    a. 02/2019 Jxnl rhythm and sinus brady in setting of Ant MI-->CCB d/c'd.   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   Tobacco use disorder    Tremor     Tobacco Use: Social History   Tobacco Use  Smoking Status Every Day   Packs/day: 1.00   Years: 40.00   Additional pack years: 0.00   Total pack years: 40.00   Types: Cigarettes  Smokeless Tobacco Never    Labs: Review Flowsheet       Latest Ref Rng & Units 03/12/2019 05/03/2019  Labs for ITP Cardiac and Pulmonary Rehab  Cholestrol 100 - 199 mg/dL 161  96   LDL (calc) 0 - 99 mg/dL 59  46   HDL-C >09 mg/dL 31  34   Trlycerides 0 - 149 mg/dL 70  79   Hemoglobin U0A 4.8 - 5.6 % 6.0  -  TCO2 22 - 32 mmol/L 18  -     Pulmonary Assessment Scores:  Pulmonary Assessment Scores     Row Name 09/30/22 1517         ADL UCSD   ADL Phase Entry     SOB Score total 28     Rest 0     Walk 1     Stairs 3     Bath 1     Dress 1     Shop 1       CAT Score   CAT Score 9       mMRC Score   mMRC Score 1              UCSD: Self-administered rating of dyspnea associated with activities of daily living (ADLs) 6-point scale (0 = "not at all" to 5 = "maximal or unable to do because of breathlessness")  Scoring Scores range from 0 to 120.  Minimally important difference is 5 units  CAT: CAT can identify the health impairment of COPD patients and is better correlated with disease progression.  CAT has a scoring range of zero to 40. The CAT score is classified into four groups of low (less than 10), medium (10 - 20), high (21-30) and very high (31-40) based on the impact level of disease on health status. A CAT score over 10 suggests significant symptoms.  A worsening CAT score could be explained by an exacerbation, poor medication adherence, poor inhaler technique, or progression of COPD or comorbid conditions.  CAT MCID is 2  points  mMRC: mMRC (Modified Medical Research Council) Dyspnea Scale is used to assess the degree of baseline functional disability in patients of respiratory disease due to dyspnea. No minimal important difference is  established. A decrease in score of 1 point or greater is considered a positive change.   Pulmonary Function Assessment:  Pulmonary Function Assessment - 09/28/22 1117       Breath   Shortness of Breath Yes;Limiting activity             Exercise Target Goals: Exercise Program Goal: Individual exercise prescription set using results from initial 6 min walk test and THRR while considering  patient's activity barriers and safety.   Exercise Prescription Goal: Initial exercise prescription builds to 30-45 minutes a day of aerobic activity, 2-3 days per week.  Home exercise guidelines will be given to patient during program as part of exercise prescription that the participant will acknowledge.  Education: Aerobic Exercise: - Group verbal and visual presentation on the components of exercise prescription. Introduces F.I.T.T principle from ACSM for exercise prescriptions.  Reviews F.I.T.T. principles of aerobic exercise including progression. Written material given at graduation. Flowsheet Row Pulmonary Rehab from 09/30/2022 in [facility] Cardiac and Pulmonary Rehab  Education need identified 09/30/22       Education: Resistance Exercise: - Group verbal and visual presentation on the components of exercise prescription. Introduces F.I.T.T principle from ACSM for exercise prescriptions  Reviews F.I.T.T. principles of resistance exercise including progression. Written material given at graduation.    Education: Exercise & Equipment Safety: - Individual verbal instruction and demonstration of equipment use and safety with use of the equipment. Flowsheet Row Pulmonary Rehab from 09/30/2022 in [facility] Cardiac and Pulmonary Rehab  Date 09/28/22  Educator [facility]  Instruction Review Code  1- Verbalizes Understanding       Education: Exercise Physiology & General Exercise Guidelines: - Group verbal and written instruction with models to review the exercise physiology of the cardiovascular system and associated critical values. Provides general exercise guidelines with specific guidelines to those with heart or lung disease.    Education: Flexibility, Balance, Mind/Body Relaxation: - Group verbal and visual presentation with interactive activity on the components of exercise prescription. Introduces F.I.T.T principle from ACSM for exercise prescriptions. Reviews F.I.T.T. principles of flexibility and balance exercise training including progression. Also discusses the mind body connection.  Reviews various relaxation techniques to help reduce and manage stress (i.e. Deep breathing, progressive muscle relaxation, and visualization). Balance handout provided to take home. Written material given at graduation.   Activity Barriers & Risk Stratification:  Activity Barriers & Cardiac Risk Stratification - 09/30/22 1541       Activity Barriers & Cardiac Risk Stratification   Activity Barriers Joint Problems;Back Problems;Muscular Weakness;Deconditioning;Shortness of Breath;Neck/Spine Problems             6 Minute Walk:  6 Minute Walk     Row Name 09/30/22 1537         6 Minute Walk   Phase Initial     Distance 1730 feet     Walk Time 6 minutes     # of Rest Breaks 0     MPH 3.28     METS 4.24     RPE 13     Perceived Dyspnea  0     VO2 Peak 14.84     Symptoms Yes (comment)     Comments leg tightness     Resting HR 51 bpm     Resting BP 98/52     Resting Oxygen Saturation  97 %     Exercise Oxygen Saturation  during 6 min walk 98 %     Max Ex. HR 84 bpm  Max Ex. BP 142/60     2 Minute Post BP 96/58       Interval HR   1 Minute HR 65     2 Minute HR 78     3 Minute HR 77     4 Minute HR 82     5 Minute HR 82     6 Minute HR 84     2 Minute Post HR 54      Interval Heart Rate? Yes       Interval Oxygen   Interval Oxygen? Yes     Baseline Oxygen Saturation % 97 %     1 Minute Oxygen Saturation % 97 %     1 Minute Liters of Oxygen 0 L  RA     2 Minute Oxygen Saturation % 97 %     2 Minute Liters of Oxygen 0 L  RA     3 Minute Oxygen Saturation % 97 %     3 Minute Liters of Oxygen 0 L  RA     4 Minute Oxygen Saturation % 98 %     4 Minute Liters of Oxygen 0 L  RA     5 Minute Oxygen Saturation % 98 %     5 Minute Liters of Oxygen 0 L  RA     6 Minute Oxygen Saturation % 97 %     6 Minute Liters of Oxygen 0 L  RA     2 Minute Post Oxygen Saturation % 98 %     2 Minute Post Liters of Oxygen 0 L  RA             Oxygen Initial Assessment:  Oxygen Initial Assessment - 09/28/22 1117       Home Oxygen   Home Oxygen Device None    Sleep Oxygen Prescription None    Home Exercise Oxygen Prescription None    Home Resting Oxygen Prescription None      Initial 6 min Walk   Oxygen Used None      Program Oxygen Prescription   Program Oxygen Prescription None      Intervention   Short Term Goals To learn and demonstrate proper use of respiratory medications;To learn and understand importance of maintaining oxygen saturations>88%;To learn and demonstrate proper pursed lip breathing techniques or other breathing techniques. ;To learn and understand importance of monitoring SPO2 with pulse oximeter and demonstrate accurate use of the pulse oximeter.    Long  Term Goals Maintenance of O2 saturations>88%;Compliance with respiratory medication;Demonstrates proper use of MDI's;Exhibits proper breathing techniques, such as pursed lip breathing or other method taught during program session;Verbalizes importance of monitoring SPO2 with pulse oximeter and return demonstration             Oxygen Re-Evaluation:  Oxygen Re-Evaluation     Row Name 10/05/22 1401 10/23/22 1113 11/23/22 1007         Program Oxygen Prescription   Program  Oxygen Prescription -- None None       Home Oxygen   Home Oxygen Device -- None None     Sleep Oxygen Prescription -- None None     Home Exercise Oxygen Prescription -- None None     Home Resting Oxygen Prescription -- None None       Goals/Expected Outcomes   Short Term Goals -- To learn and understand importance of maintaining oxygen saturations>88%;To learn and demonstrate proper pursed lip breathing techniques or other breathing techniques.  To learn and understand importance of monitoring SPO2 with pulse oximeter and demonstrate accurate use of the pulse oximeter.;To learn and understand importance of maintaining oxygen saturations>88%;To learn and demonstrate proper pursed lip breathing techniques or other breathing techniques.      Long  Term Goals -- Exhibits proper breathing techniques, such as pursed lip breathing or other method taught during program session Verbalizes importance of monitoring SPO2 with pulse oximeter and return demonstration;Maintenance of O2 saturations>88%;Exhibits proper breathing techniques, such as pursed lip breathing or other method taught during program session     Comments Reviewed PLB technique with pt.  Talked about how it works and it's importance in maintaining their exercise saturations. Informed patient how to perform the Pursed Lipped breathing technique. Told patient to Inhale through the nose and out the mouth with pursed lips to keep their airways open, help oxygenate them better, practice when at rest or doing strenuous activity. Patient Verbalizes understanding of technique and will work on and be reiterated during [name]. Cletis Athens is doing well in rehab.  His saturations are doing well and he has been using PLB to help with breathing.     Goals/Expected Outcomes Short: Become more profiecient at using PLB.   Long: Become independent at using PLB. Short: use PLB with exertion. Long: use PLB on exertion proficiently and independently. Short: use PLB with  exertion. Long: use PLB on exertion proficiently and independently and monitor saturations              Oxygen Discharge (Final Oxygen Re-Evaluation):  Oxygen Re-Evaluation - 11/23/22 1007       Program Oxygen Prescription   Program Oxygen Prescription None      Home Oxygen   Home Oxygen Device None    Sleep Oxygen Prescription None    Home Exercise Oxygen Prescription None    Home Resting Oxygen Prescription None      Goals/Expected Outcomes   Short Term Goals To learn and understand importance of monitoring SPO2 with pulse oximeter and demonstrate accurate use of the pulse oximeter.;To learn and understand importance of maintaining oxygen saturations>88%;To learn and demonstrate proper pursed lip breathing techniques or other breathing techniques.     Long  Term Goals Verbalizes importance of monitoring SPO2 with pulse oximeter and return demonstration;Maintenance of O2 saturations>88%;Exhibits proper breathing techniques, such as pursed lip breathing or other method taught during program session    Comments Cletis Athens is doing well in rehab.  His saturations are doing well and he has been using PLB to help with breathing.    Goals/Expected Outcomes Short: use PLB with exertion. Long: use PLB on exertion proficiently and independently and monitor saturations             Initial Exercise Prescription:  Initial Exercise Prescription - 09/30/22 1500       Date of Initial Exercise RX and Referring Provider   Date 09/30/22    Referring Provider Vito Berger MD      Oxygen   Maintain Oxygen Saturation 88% or higher      Treadmill   MPH 3    Grade 1    Minutes 15    METs 3.71      Recumbant Bike   Level 3    Watts 57    Minutes 15    METs 4.24      NuStep   Level 4    Minutes 15    METs 4.24      Prescription Details   Frequency (times  per week) 2    Duration Progress to 30 minutes of continuous aerobic without signs/symptoms of physical distress      Intensity    THRR 40-80% of Max Heartrate 92-133    Ratings of Perceived Exertion 11-13    Perceived Dyspnea 0-4      Progression   Progression Continue to progress workloads to maintain intensity without signs/symptoms of physical distress.      Resistance Training   Training Prescription Yes    Weight 4 lb    Reps 10-15             Perform Capillary Blood Glucose checks as needed.  Exercise Prescription Changes:   Exercise Prescription Changes     Row Name 09/30/22 1500 10/13/22 1400 10/29/22 1300 10/30/22 1100 11/10/22 1500     Response to Exercise   Blood Pressure (Admit) 98/52 108/60 94/60 -- 108/64   Blood Pressure (Exercise) 142/60 124/68 148/62 -- --   Blood Pressure (Exit) 96/58 104/62 102/62 -- 98/56   Heart Rate (Admit) 51 bpm 55 bpm 49 bpm -- 49 bpm   Heart Rate (Exercise) 84 bpm 85 bpm 111 bpm -- 88 bpm   Heart Rate (Exit) 54 bpm 62 bpm 63 bpm -- 59 bpm   Oxygen Saturation (Admit) 97 % 98 % 98 % -- 97 %   Oxygen Saturation (Exercise) 97 % 92 % 95 % -- 93 %   Oxygen Saturation (Exit) 98 % 97 % 96 % -- 96 %   Rating of Perceived Exertion (Exercise) 13 13 14  -- 12   Perceived Dyspnea (Exercise) 0 1 0 -- 0   Symptoms leg tightness slight SOB none -- none   Comments Results 2nd full day of exercise -- -- --   Duration -- Continue with 30 min of aerobic exercise without signs/symptoms of physical distress. Continue with 30 min of aerobic exercise without signs/symptoms of physical distress. -- Continue with 30 min of aerobic exercise without signs/symptoms of physical distress.   Intensity -- THRR unchanged THRR unchanged -- THRR unchanged     Progression   Progression -- Continue to progress workloads to maintain intensity without signs/symptoms of physical distress. Continue to progress workloads to maintain intensity without signs/symptoms of physical distress. -- Continue to progress workloads to maintain intensity without signs/symptoms of physical distress.    Average METs -- 4.84 5.92 -- 6.59     Resistance Training   Training Prescription -- Yes Yes -- Yes   Weight -- 6 lb 6 lb -- 6 lb   Reps -- 10-15 10-15 -- 10-15     Interval Training   Interval Training -- No No -- No     Treadmill   MPH -- 3.8 4 -- 3.7   Grade -- 1 5 -- 5.5   Minutes -- 15 30 -- 15   METs -- 4.43 6.82 -- 6.64     Recumbant Elliptical   Level -- 11 -- -- --   Minutes -- 15 -- -- --   METs -- 6.2 -- -- --     REL-XR   Level -- -- 12 -- 13   Minutes -- -- 15 -- 15   METs -- -- 7.8 -- 9     T5 Nustep   Level -- -- 5 -- --   Minutes -- -- 15 -- --   METs -- -- 4 -- --     Home Exercise Plan   Plans to continue exercise at -- -- --  Home (comment)  walking, working in garden Home (comment)  walking, working in garden   Frequency -- -- -- Add 2 additional days to program exercise sessions. Add 2 additional days to program exercise sessions.   Initial Home Exercises Provided -- -- -- 10/30/22 10/30/22     Oxygen   Maintain Oxygen Saturation -- -- 88% or higher -- 88% or higher    Row Name 11/24/22 0800             Response to Exercise   Blood Pressure (Admit) 112/58       Blood Pressure (Exit) 108/56       Heart Rate (Admit) 52 bpm       Heart Rate (Exercise) 81 bpm       Heart Rate (Exit) 65 bpm       Oxygen Saturation (Admit) 96 %       Oxygen Saturation (Exercise) 95 %       Oxygen Saturation (Exit) 97 %       Rating of Perceived Exertion (Exercise) 12       Perceived Dyspnea (Exercise) 0       Symptoms none       Duration Continue with 30 min of aerobic exercise without signs/symptoms of physical distress.       Intensity THRR unchanged         Progression   Progression Continue to progress workloads to maintain intensity without signs/symptoms of physical distress.       Average METs 4.77         Resistance Training   Training Prescription Yes       Weight 6 lb       Reps 10-15         Interval Training   Interval Training No          Treadmill   MPH 3.4       Grade 2.5       Minutes 30       METs 4.77         Home Exercise Plan   Plans to continue exercise at Home (comment)  walking, working in garden       Frequency Add 2 additional days to program exercise sessions.       Initial Home Exercises Provided 10/30/22         Oxygen   Maintain Oxygen Saturation 88% or higher                Exercise Comments:   Exercise Comments     Row Name 10/05/22 1401           Exercise Comments First full day of exercise!  Patient was oriented to gym and equipment including functions, settings, policies, and procedures.  Patient's individual exercise prescription and treatment plan were reviewed.  All starting workloads were established based on the results of the 6 minute walk test done at initial orientation visit.  The plan for exercise progression was also introduced and progression will be customized based on patient's performance and goals.                Exercise Goals and Review:   Exercise Goals     Row Name 09/30/22 1541             Exercise Goals   Increase Physical Activity Yes       Intervention Provide advice, education, support and counseling about physical activity/exercise needs.;Develop an individualized exercise prescription for aerobic and  resistive training based on initial evaluation findings, risk stratification, comorbidities and participant's personal goals.       Expected Outcomes Short Term: Attend rehab on a regular basis to increase amount of physical activity.;Long Term: Add in home exercise to make exercise part of routine and to increase amount of physical activity.;Long Term: Exercising regularly at least 3-5 days a week.       Increase Strength and Stamina Yes       Intervention Provide advice, education, support and counseling about physical activity/exercise needs.;Develop an individualized exercise prescription for aerobic and resistive training based on initial  evaluation findings, risk stratification, comorbidities and participant's personal goals.       Expected Outcomes Short Term: Increase workloads from initial exercise prescription for resistance, speed, and METs.;Short Term: Perform resistance training exercises routinely during rehab and add in resistance training at home;Long Term: Improve cardiorespiratory fitness, muscular endurance and strength as measured by increased METs and functional capacity ( )       Able to understand and use rate of perceived exertion (RPE) scale Yes       Intervention Provide education and explanation on how to use RPE scale       Expected Outcomes Short Term: Able to use RPE daily in rehab to express subjective intensity level;Long Term:  Able to use RPE to guide intensity level when exercising independently       Able to understand and use Dyspnea scale Yes       Intervention Provide education and explanation on how to use Dyspnea scale       Expected Outcomes Short Term: Able to use Dyspnea scale daily in rehab to express subjective sense of shortness of breath during exertion;Long Term: Able to use Dyspnea scale to guide intensity level when exercising independently       Knowledge and understanding of Target Heart Rate Range (THRR) Yes       Intervention Provide education and explanation of THRR including how the numbers were predicted and where they are located for reference       Expected Outcomes Short Term: Able to state/look up THRR;Short Term: Able to use daily as guideline for intensity in rehab;Long Term: Able to use THRR to govern intensity when exercising independently       Able to check pulse independently Yes       Intervention Provide education and demonstration on how to check pulse in carotid and radial arteries.;Review the importance of being able to check your own pulse for safety during independent exercise       Expected Outcomes Short Term: Able to explain why pulse checking is important  during independent exercise;Long Term: Able to check pulse independently and accurately       Understanding of Exercise Prescription Yes       Intervention Provide education, explanation, and written materials on patient's individual exercise prescription       Expected Outcomes Short Term: Able to explain program exercise prescription;Long Term: Able to explain home exercise prescription to exercise independently                Exercise Goals Re-Evaluation :  Exercise Goals Re-Evaluation     Row Name 10/05/22 1402 10/13/22 1451 10/29/22 1340 10/30/22 1116 11/10/22 1511     Exercise Goal Re-Evaluation   Exercise Goals Review Increase Physical Activity;Able to understand and use rate of perceived exertion (RPE) scale;Knowledge and understanding of Target Heart Rate Range (THRR);Understanding of Exercise Prescription;Increase Strength and Stamina;Able to check  pulse independently Increase Physical Activity;Increase Strength and Stamina;Understanding of Exercise Prescription Increase Physical Activity;Increase Strength and Stamina;Understanding of Exercise Prescription Increase Physical Activity;Increase Strength and Stamina;Able to understand and use rate of perceived exertion (RPE) scale;Able to understand and use Dyspnea scale;Knowledge and understanding of Target Heart Rate Range (THRR);Able to check pulse independently;Understanding of Exercise Prescription Increase Physical Activity;Increase Strength and Stamina;Understanding of Exercise Prescription   Comments Reviewed RPE scale, THR and program prescription with pt today.  Pt voiced understanding and was given a copy of goals to take home. Nicola Girt is off to a good start with the program. He is already working at pretty significant high loads, around level 11 on the XR and up to 3.8 mph speed on the treadmill, all with appropriate RPEs. We will continue to monitor his progress as he furthers in the program. Cletis Athens is doing well in rehab. He  recently increased his overall average MET level to 5.92 METs. He also increased his treadmill workload to 4 mph with an incline of 5%, and he walked at this pace for 30 minutes. He improved to level 12 on the XR and level 5 on the T5 nustep as well. We will continue to monitor his progress in the program. Reviewed home exercise with pt today.  Pt plans to add in some walking in the garden for exercise. Reviewed THR, pulse, RPE, sign and symptoms, pulse oximetery and when to call 911 or MD.  Also discussed weather considerations and indoor options.  Pt voiced understanding. We talked about the importance of adding in home exerise. Jarmarcus has been doing well in rehab. He has increased to a 3.7/5.5% treadmill workload which he completes for 30 minutes at a time.  He increased to level 13 on the XR, working over 9 METS!  His oxygen is staying well above 88%. We will continue to monitor.   Expected Outcomes Short: Use RPE daily to regulate intensity.  Long: Follow program prescription in THR. Short: Follow current exercise prescription, progress workloads slowly and gradually Long: Increase overall strength and stamina Short: Continue to progressively increase workloads. Long: Continue to improve strength and stamina. Short: Add in some exercise at home Long: Conitnue to improve stamina Short: Add in intervals to regimen Long: Continue to increase overall MET level and stamina    Row Name 11/23/22 0959 11/24/22 0808           Exercise Goal Re-Evaluation   Exercise Goals Review Increase Physical Activity;Increase Strength and Stamina;Understanding of Exercise Prescription Increase Physical Activity;Increase Strength and Stamina;Understanding of Exercise Prescription      Comments Cletis Athens returned to rehab today.  He has been staying active by working in the garden while he was out.  He is happy to be back and ready to get back to exercise again. Cletis Athens has only attended rehab once since the last review. During that  one session he walked on the treadmill for 30 minutes at a speed of 3.4 mph with an incline of 2.5%. He also has continued to use 6 lb hand weights for resistance training. We will continue to monitor his progress in the program.      Expected Outcomes Short: Continue to return to routine exercise Long: conitnue to improve stamina Short: Attend rehab regularly. Long: Continue to improve strength and stamina.               Discharge Exercise Prescription (Final Exercise Prescription Changes):  Exercise Prescription Changes - 11/24/22 0800  Response to Exercise   Blood Pressure (Admit) 112/58    Blood Pressure (Exit) 108/56    Heart Rate (Admit) 52 bpm    Heart Rate (Exercise) 81 bpm    Heart Rate (Exit) 65 bpm    Oxygen Saturation (Admit) 96 %    Oxygen Saturation (Exercise) 95 %    Oxygen Saturation (Exit) 97 %    Rating of Perceived Exertion (Exercise) 12    Perceived Dyspnea (Exercise) 0    Symptoms none    Duration Continue with 30 min of aerobic exercise without signs/symptoms of physical distress.    Intensity THRR unchanged      Progression   Progression Continue to progress workloads to maintain intensity without signs/symptoms of physical distress.    Average METs 4.77      Resistance Training   Training Prescription Yes    Weight 6 lb    Reps 10-15      Interval Training   Interval Training No      Treadmill   MPH 3.4    Grade 2.5    Minutes 30    METs 4.77      Home Exercise Plan   Plans to continue exercise at Home (comment)   walking, working in garden   Frequency Add 2 additional days to program exercise sessions.    Initial Home Exercises Provided 10/30/22      Oxygen   Maintain Oxygen Saturation 88% or higher             Nutrition:  Target Goals: Understanding of nutrition guidelines, daily intake of sodium 1500mg , cholesterol 200mg , calories 30% from fat and 7% or less from saturated fats, daily to have 5 or more servings of fruits  and vegetables.  Education: All About Nutrition: -Group instruction provided by verbal, written material, interactive activities, discussions, models, and posters to present general guidelines for heart healthy nutrition including fat, fiber, MyPlate, the role of sodium in heart healthy nutrition, utilization of the nutrition label, and utilization of this knowledge for meal planning. Follow up email sent as well. Written material given at graduation.   Biometrics:  Pre Biometrics - 09/30/22 1542       Pre Biometrics   Height 6' 2.25" (1.886 m)    Weight 176 lb 14.4 oz (80.2 kg)    Waist Circumference 34 inches    Hip Circumference 40 inches    Waist to Hip Ratio 0.85 %    BMI (Calculated) 22.56    Single Leg Stand 30 seconds              Nutrition Therapy Plan and Nutrition Goals:  Nutrition Therapy & Goals - 09/30/22 1520       Intervention Plan   Intervention Prescribe, educate and counsel regarding individualized specific dietary modifications aiming towards targeted core components such as weight, hypertension, lipid management, diabetes, heart failure and other comorbidities.    Expected Outcomes Short Term Goal: Understand basic principles of dietary content, such as calories, fat, sodium, cholesterol and nutrients.;Short Term Goal: A plan has been developed with personal nutrition goals set during dietitian appointment.;Long Term Goal: Adherence to prescribed nutrition plan.             Nutrition Assessments:  MEDIFICTS Score Key: ?70 Need to make dietary changes  40-70 Heart Healthy Diet ? 40 Therapeutic Level Cholesterol Diet  Flowsheet Row Pulmonary Rehab from 09/30/2022 in [facility]  Picture Your Plate Total Score on Admission 53  Picture Your Plate Scores: <40 Unhealthy dietary pattern with much room for improvement. 41-50 Dietary pattern unlikely to meet recommendations for good health and room for improvement. 51-60 More  healthful dietary pattern, with some room for improvement.  >60 Healthy dietary pattern, although there may be some specific behaviors that could be improved.   Nutrition Goals Re-Evaluation:  Nutrition Goals Re-Evaluation     Row Name 10/23/22 1120 11/23/22 1003           Goals   Current Weight 181 lb (82.1 kg) --      Nutrition Goal Eat smaller portions. Short: Choose and plan snacks accordingly to patients caloric intake to improve breathing. Long: Maintain a diet independently that meets their caloric intake to aid in daily shortness of breath.      Comment Patient was informed on why it is important to maintain a balanced diet when dealing with Respiratory issues. Explained that it takes a lot of energy to breath and when they are short of breath often they will need to have a good diet to help keep up with the calories they are expending for breathing. Jon conintues to work on his diet.  He feels that he is doing pretty good with his diet.  He continues to make changes and trying to find healthier options.  We talked about staying away from salt with heart history as well.      Expected Outcome Short: Choose and plan snacks accordingly to patients caloric intake to improve breathing. Long: Maintain a diet independently that meets their caloric intake to aid in daily shortness of breath. Short; conitnue to make healthier choices Long: Conitnue to improve balance               Nutrition Goals Discharge (Final Nutrition Goals Re-Evaluation):  Nutrition Goals Re-Evaluation - 11/23/22 1003       Goals   Nutrition Goal Short: Choose and plan snacks accordingly to patients caloric intake to improve breathing. Long: Maintain a diet independently that meets their caloric intake to aid in daily shortness of breath.    Comment Jon conintues to work on his diet.  He feels that he is doing pretty good with his diet.  He continues to make changes and trying to find healthier options.  We talked  about staying away from salt with heart history as well.    Expected Outcome Short; conitnue to make healthier choices Long: Conitnue to improve balance             Psychosocial: Target Goals: Acknowledge presence or absence of significant depression and/or stress, maximize coping skills, provide positive support system. Participant is able to verbalize types and ability to use techniques and skills needed for reducing stress and depression.   Education: Stress, Anxiety, and Depression - Group verbal and visual presentation to define topics covered.  Reviews how body is impacted by stress, anxiety, and depression.  Also discusses healthy ways to reduce stress and to treat/manage anxiety and depression.  Written material given at graduation.   Education: Sleep Hygiene -Provides group verbal and written instruction about how sleep can affect your health.  Define sleep hygiene, discuss sleep cycles and impact of sleep habits. Review good sleep hygiene tips.    Initial Review & Psychosocial Screening:  Initial Psych Review & Screening - 09/28/22 1120       Initial Review   Current issues with Current Stress Concerns    Comments He can look to his Wife, kids and grandkids  for support. He is having more shortness of breath than usual and wants to get that under control. He has done with Cardiac Rehab program in 2020.      Family Dynamics   Good Support System? Yes      Barriers   Psychosocial barriers to participate in program The patient should benefit from training in stress management and relaxation.      Screening Interventions   Interventions Encouraged to exercise;To provide support and resources with identified psychosocial needs;Program counselor consult;Provide feedback about the scores to participant    Expected Outcomes Short Term goal: Utilizing psychosocial counselor, staff and physician to assist with identification of specific Stressors or current issues interfering with  healing process. Setting desired goal for each stressor or current issue identified.;Long Term Goal: Stressors or current issues are controlled or eliminated.;Short Term goal: Identification and review with participant of any Quality of Life or Depression concerns found by scoring the questionnaire.;Long Term goal: The participant improves quality of Life and PHQ9 Scores as seen by post scores and/or verbalization of changes             Quality of Life Scores:  Scores of 19 and below usually indicate a poorer quality of life in these areas.  A difference of  2-3 points is a clinically meaningful difference.  A difference of 2-3 points in the total score of the Quality of Life Index has been associated with significant improvement in overall quality of life, self-image, physical symptoms, and general health in studies assessing change in quality of life.  PHQ-9: Review Flowsheet       10/23/2022 09/30/2022 10/23/2019 05/01/2019  Depression screen PHQ 2/9  Decreased Interest 0 0 0 0  Down, Depressed, Hopeless 0 0 0 0  PHQ - 2 Score 0 0 0 0  Altered sleeping 0 0 0 0  Tired, decreased energy 3 3 1 1   Change in appetite 0 2 0 1  Feeling bad or failure about yourself  0 0 0 0  Trouble concentrating 0 0 0 0  Moving slowly or fidgety/restless 0 0 0 0  Suicidal thoughts 0 0 0 0  PHQ-9 Score 3 5 1 2   Difficult doing work/chores Somewhat difficult Very difficult Somewhat difficult Somewhat difficult   Interpretation of Total Score  Total Score Depression Severity:  1-4 = Minimal depression, 5-9 = Mild depression, 10-14 = Moderate depression, 15-19 = Moderately severe depression, 20-27 = Severe depression   Psychosocial Evaluation and Intervention:  Psychosocial Evaluation - 09/28/22 1121       Psychosocial Evaluation & Interventions   Interventions Encouraged to exercise with the program and follow exercise prescription;Relaxation education;Stress management education    Comments He can look  to his Wife, kids and grandkids for support. He is having more shortness of breath than usual and wants to get that under control. He has done with Cardiac Rehab program in 2020.    Expected Outcomes Short: Start [name] to help with mood. Long: Maintain a healthy mental state.    Continue Psychosocial Services  Follow up required by staff             Psychosocial Re-Evaluation:  Psychosocial Re-Evaluation     Row Name 10/23/22 1117 11/23/22 1001           Psychosocial Re-Evaluation   Current issues with Current Stress Concerns Current Stress Concerns      Comments Reviewed patient health questionnaire (PHQ-9) with patient for follow up. Previously, patients score indicated  signs/symptoms of depression.  Reviewed to see if patient is improving symptom wise while in program.  Score improved and patient states that it is because he has been able to increase his exercise level. Cletis Athens has been out with a heart workup after a nuclear stress test, but echo found everything to be normal, so he is pleased with that. He is feeling pretty good otherwise and just meet things where they are and tackles the daily tasks.  He tries to not get too stressed.  He is sleeping well for most part.      Expected Outcomes Short: Continue to attend [name] regularly for regular exercise and social engagement. Long: Continue to improve symptoms and manage a positive mental state. Short; Return to routine exercise for mental boost Long: Continue to stay positive      Interventions Encouraged to attend Pulmonary Rehabilitation for the exercise Encouraged to attend Pulmonary Rehabilitation for the exercise      Continue Psychosocial Services  Follow up required by staff --               Psychosocial Discharge (Final Psychosocial Re-Evaluation):  Psychosocial Re-Evaluation - 11/23/22 1001       Psychosocial Re-Evaluation   Current issues with Current Stress Concerns    Comments Cletis Athens has been out with a  heart workup after a nuclear stress test, but echo found everything to be normal, so he is pleased with that. He is feeling pretty good otherwise and just meet things where they are and tackles the daily tasks.  He tries to not get too stressed.  He is sleeping well for most part.    Expected Outcomes Short; Return to routine exercise for mental boost Long: Continue to stay positive    Interventions Encouraged to attend Pulmonary Rehabilitation for the exercise             Education: Education Goals: Education classes will be provided on a weekly basis, covering required topics. Participant will state understanding/return demonstration of topics presented.  Learning Barriers/Preferences:  Learning Barriers/Preferences - 09/28/22 1116       Learning Barriers/Preferences   Learning Barriers None    Learning Preferences Individual Instruction             General Pulmonary Education Topics:  Infection Prevention: - Provides verbal and written material to individual with discussion of infection control including proper hand washing and proper equipment cleaning during exercise session. Flowsheet Row Pulmonary Rehab from 09/30/2022 in [facility] Card[facility] and Pulmonary Rehab  Date 09/28/22  Educator [facility]  Instruction Review Code 1- Verbalizes Understanding       Falls Prevention: - Provides verbal and written material to individual with discussion of falls prevention and safety. Flowsheet Row Pulmonary Rehab from 09/30/2022 in [facility] Card[facility] and Pulmonary Rehab  Date 09/28/22  Educator [facility]  Instruction Review Code 1- Verbalizes Understanding       Chronic Lung Disease Review: - Group verbal instruction with posters, models, PowerPoint presentations and videos,  to review new updates, new respiratory medications, new advancements in procedures and treatments. Providing information on websites and "800" numbers for continued self-education. Includes information about supplement oxygen,  available portable oxygen systems, continuous and intermittent flow rates, oxygen safety, concentrators, and Medicare reimbursement for oxygen. Explanation of Pulmonary Drugs, including class, frequency, complications, importance of spacers, rinsing mouth after steroid MDI's, and proper cleaning methods for nebulizers. Review of basic lung anatomy and physiology related to function, structure, and complications of lung disease. Review of risk  factors. Discussion about methods for diagnosing sleep apnea and types of masks and machines for OSA. Includes a review of the use of types of environmental controls: home humidity, furnaces, filters, dust mite/pet prevention, HEPA vacuums. Discussion about weather changes, air quality and the benefits of nasal washing. Instruction on Warning signs, infection symptoms, calling MD promptly, preventive modes, and value of vaccinations. Review of effective airway clearance, coughing and/or vibration techniques. Emphasizing that all should Create an Action Plan. Written material given at graduation.   AED/CPR: - Group verbal and written instruction with the use of models to demonstrate the basic use of the AED with the basic ABC's of resuscitation.    Anatomy and Cardiac Procedures: - Group verbal and visual presentation and models provide information about basic cardiac anatomy and function. Reviews the testing methods done to diagnose heart disease and the outcomes of the test results. Describes the treatment choices: Medical Management, Angioplasty, or Coronary Bypass Surgery for treating various heart conditions including Myocardial Infarction, Angina, Valve Disease, and Cardiac Arrhythmias.  Written material given at graduation.   Medication Safety: - Group verbal and visual instruction to review commonly prescribed medications for heart and lung disease. Reviews the medication, class of the drug, and side effects. Includes the steps to properly store meds and  maintain the prescription regimen.  Written material given at graduation.   Other: -Provides group and verbal instruction on various topics (see comments)   Knowledge Questionnaire Score:  Knowledge Questionnaire Score - 09/30/22 1520       Knowledge Questionnaire Score   Pre Score 15/18              Core Components/Risk Factors/Patient Goals at Admission:  Personal Goals and Risk Factors at Admission - 09/28/22 1118       Core Components/Risk Factors/Patient Goals on Admission    Weight Management Yes;Weight Maintenance;Weight Gain    Intervention Weight Management: Develop a combined nutrition and exercise program designed to reach desired caloric intake, while maintaining appropriate intake of nutrient and fiber, sodium and fats, and appropriate energy expenditure required for the weight goal.;Weight Management: Provide education and appropriate resources to help participant work on and attain dietary goals.;Weight Management/Obesity: Establish reasonable short term and long term weight goals.    Expected Outcomes Short Term: Continue to assess and modify interventions until short term weight is achieved;Long Term: Adherence to nutrition and physical activity/exercise program aimed toward attainment of established weight goal;Weight Maintenance: Understanding of the daily nutrition guidelines, which includes 25-35% calories from fat, 7% or less cal from saturated fats, less than 200mg  cholesterol, less than 1.5gm of sodium, & 5 or more servings of fruits and vegetables daily;Understanding recommendations for meals to include 15-35% energy as protein, 25-35% energy from fat, 35-60% energy from carbohydrates, less than 200mg  of dietary cholesterol, 20-35 gm of total fiber daily;Understanding of distribution of calorie intake throughout the day with the consumption of 4-5 meals/snacks;Weight Gain: Understanding of general recommendations for a high calorie, high protein meal plan that  promotes weight gain by distributing calorie intake throughout the day with the consumption for 4-5 meals, snacks, and/or supplements    Tobacco Cessation Yes    Intervention Assist the participant in steps to quit. Provide individualized education and counseling about committing to Tobacco Cessation, relapse prevention, and pharmacological support that can be provided by physician.;Education officer, environmental, assist with locating and accessing local/national Quit Smoking programs, and support quit date choice.    Expected Outcomes Short Term: Will demonstrate readiness to  quit, by selecting a quit date.;Short Term: Will quit all tobacco product use, adhering to prevention of relapse plan.;Long Term: Complete abstinence from all tobacco products for at least 12 months from quit date.    Improve shortness of breath with ADL's Yes    Intervention Provide education, individualized exercise plan and daily activity instruction to help decrease symptoms of SOB with activities of daily living.    Expected Outcomes Short Term: Improve cardiorespiratory fitness to achieve a reduction of symptoms when performing ADLs;Long Term: Be able to perform more ADLs without symptoms or delay the onset of symptoms    Hypertension Yes    Intervention Provide education on lifestyle modifcations including regular physical activity/exercise, weight management, moderate sodium restriction and increased consumption of fresh fruit, vegetables, and low fat dairy, alcohol moderation, and smoking cessation.;Monitor prescription use compliance.    Expected Outcomes Short Term: Continued assessment and intervention until BP is < 140/2mm HG in hypertensive participants. < 130/54mm HG in hypertensive participants with diabetes, heart failure or chronic kidney disease.;Long Term: Maintenance of blood pressure at goal levels.    Lipids Yes    Intervention Provide education and support for participant on nutrition & aerobic/resistive  exercise along with prescribed medications to achieve LDL 70mg , HDL >40mg .    Expected Outcomes Short Term: Participant states understanding of desired cholesterol values and is compliant with medications prescribed. Participant is following exercise prescription and nutrition guidelines.;Long Term: Cholesterol controlled with medications as prescribed, with individualized exercise RX and with personalized nutrition plan. Value goals: LDL < 70mg , HDL > 40 mg.             Education:Diabetes - Individual verbal and written instruction to review signs/symptoms of diabetes, desired ranges of glucose level fasting, after meals and with exercise. Acknowledge that pre and post exercise glucose checks will be done for 3 sessions at entry of program.   Know Your Numbers and Heart Failure: - Group verbal and visual instruction to discuss disease risk factors for cardiac and pulmonary disease and treatment options.  Reviews associated critical values for Overweight/Obesity, Hypertension, Cholesterol, and Diabetes.  Discusses basics of heart failure: signs/symptoms and treatments.  Introduces Heart Failure Zone chart for action plan for heart failure.  Written material given at graduation. Flowsheet Row Pulmonary Rehab from 09/30/2022 in [facility] Cardiac and Pulmonary Rehab  Education need identified 09/30/22       Core Components/Risk Factors/Patient Goals Review:   Goals and Risk Factor Review     Row Name 10/23/22 1115 11/23/22 1004           Core Components/Risk Factors/Patient Goals Review   Personal Goals Review Improve shortness of breath with ADL's Improve shortness of breath with ADL's;Tobacco Cessation;Weight Management/Obesity;Hypertension      Review Spoke to patient about their shortness of breath and what they can do to improve. Patient has been informed of breathing techniques when starting the program. Patient is informed to tell staff if they have had any med changes and that certain  meds they are taking or not taking can be causing shortness of breath. Cletis Athens is doing well in rehab. His weight is staying steady for most part.  His breathing is doing well and he feels he is doing well.  He is still smoking a pack each day with no plans to quit.  His pressures are doing well.  He is feeling pretty good overall.  He had some acupuncture to help with plantar facsitis and has more testing coming up next  week.      Expected Outcomes Short: Attend [name] regularly to improve shortness of breath with ADL's. Long: maintain independence with ADL's Short: Keep [name] updated with testing.  Long: [place] to work on breathing               Core Components/Risk Factors/Patient Goals at Discharge (Final Review):   Goals and Risk Factor Review - 11/23/22 1004       Core Components/Risk Factors/Patient Goals Review   Personal Goals Review Improve shortness of breath with ADL's;Tobacco Cessation;Weight Management/Obesity;Hypertension    Review Cletis Athens is doing well in rehab. His weight is staying steady for most part.  His breathing is doing well and he feels he is doing well.  He is still smoking a pack each day with no plans to quit.  His pressures are doing well.  He is feeling pretty good overall.  He had some acupuncture to help with plantar facsitis and has more testing coming up next week.    Expected Outcomes Short: Keep [name] updated with testing.  Long: Conitnue to work on breathing             ITP Comments:  ITP Comments     Row Name 09/28/22 1115 09/30/22 1516 10/05/22 1401 10/07/22 0825 11/04/22 0928   ITP Comments Virtual Visit completed. Patient informed on EP and RD appointment and 6 Minute walk test. Patient also informed of patient health questionnaires on My Chart. Patient Verbalizes understanding. Visit diagnosis can be found in [facility] media,patient is [place]. Completed and gym orientation. Initial ITP created and sent for review to Dr. Vida Rigger, Medical Director. First  full day of exercise!  Patient was oriented to gym and equipment including functions, settings, policies, and procedures.  Patient's individual exercise prescription and treatment plan were reviewed.  All starting workloads were established based on the results of the 6 minute walk test done at initial orientation visit.  The plan for exercise progression was also introduced and progression will be customized based on patient's performance and goals. 30 Day review completed. Medical Director ITP review done, changes made as directed, and signed approval by Medical Director.     new to program 30 day review completed. ITP sent to Dr. Jinny Sanders, Medical Director of  Pulmonary Rehab. Continue with ITP unless changes are made by physician.    Row Name 11/23/22 [phone] 12/02/22 0839         ITP Comments Cletis Athens returned to rehab today after heart workup. 30 Day review completed. Medical Director ITP review done, changes made as directed, and signed approval by Medical Director.               Comments:

## 2022-12-03 ENCOUNTER — Other Ambulatory Visit: Payer: Self-pay

## 2022-12-03 DIAGNOSIS — I739 Peripheral vascular disease, unspecified: Secondary | ICD-10-CM

## 2022-12-04 ENCOUNTER — Encounter: Payer: Self-pay | Admitting: Nurse Practitioner

## 2022-12-04 ENCOUNTER — Encounter: Payer: Medicare PPO | Admitting: *Deleted

## 2022-12-04 ENCOUNTER — Ambulatory Visit: Payer: Medicare PPO | Attending: Nurse Practitioner | Admitting: Nurse Practitioner

## 2022-12-04 VITALS — BP 114/66 | HR 56 | Ht 75.0 in | Wt 180.0 lb

## 2022-12-04 DIAGNOSIS — I480 Paroxysmal atrial fibrillation: Secondary | ICD-10-CM

## 2022-12-04 DIAGNOSIS — R0609 Other forms of dyspnea: Secondary | ICD-10-CM | POA: Diagnosis not present

## 2022-12-04 DIAGNOSIS — I5022 Chronic systolic (congestive) heart failure: Secondary | ICD-10-CM | POA: Diagnosis not present

## 2022-12-04 DIAGNOSIS — I2510 Atherosclerotic heart disease of native coronary artery without angina pectoris: Secondary | ICD-10-CM | POA: Diagnosis not present

## 2022-12-04 DIAGNOSIS — Z720 Tobacco use: Secondary | ICD-10-CM

## 2022-12-04 DIAGNOSIS — E785 Hyperlipidemia, unspecified: Secondary | ICD-10-CM | POA: Diagnosis not present

## 2022-12-04 DIAGNOSIS — I739 Peripheral vascular disease, unspecified: Secondary | ICD-10-CM

## 2022-12-04 NOTE — Patient Instructions (Signed)
Medication Instructions:  Your physician recommends that you continue on your current medications as directed. Please refer to the Current Medication list given to you today.  *If you need a refill on your cardiac medications before your next appointment, please call your pharmacy*  Lab Work: -None ordered If you have labs (blood work) drawn today and your tests are completely normal, you will receive your results only by: MyChart Message (if you have MyChart) OR A paper copy in the mail If you have any lab test that is abnormal or we need to change your treatment, we will call you to review the results.  Testing/Procedures: -None ordered  Follow-Up: At [facility], you and your health needs are our priority.  As part of our continuing mission to provide you with exceptional heart care, we have created designated Provider Care Teams.  These Care Teams include your primary Cardiologist (physician) and Advanced Practice Providers (APPs -  Physician Assistants and Nurse Practitioners) who all work together to provide you with the care you need, when you need it.  Your next appointment:   3 - 4 month(s)  Provider:   You may see Yvonne Kendall, MD or one of the following Advanced Practice Providers on your designated Care Team:   Nicolasa Ducking, NP  Other Instructions -PV follow-up with Dr. [name]

## 2022-12-04 NOTE — Progress Notes (Signed)
Office Visit    Patient Name: Brian Mcclure Date of Encounter: 12/04/2022  Primary Care Provider:  [facility] Primary Cardiologist:  Brian Kendall, MD  Chief Complaint    67 year old male with history of CAD status post anterior MI and LAD stenting in August 2020, ischemic cardiomyopathy, HFimpEF, LV apical thrombus, paroxysmal atrial fibrillation, peripheral vascular disease, sinus and junctional bradycardia in the setting of anterior MI, hyperlipidemia, COPD, and tobacco abuse, who presents for follow-up related to CAD.  Past Medical History    Past Medical History:  Diagnosis Date   Actinic keratosis    Acute ST elevation myocardial infarction (STEMI) of anterior wall (HCC)    a. 02/2019 s/p PCI/DES ->LAD.   Apical mural thrombus following MI (HCC)    a. 02/2019 s/p Ant MI-->Echo: Small, fixed thrombus on the apical wall of LV-->coumadin.   Barrett's esophagus    CAD (coronary artery disease)    a. 02/2019 Ant STEMI/PCI: LM nl, LAD 100p (4x18 Resolute Onyx DES), LCX/RCA min irregs, EF 35-45%; b. 10/2022 MV: EF 38% (nl by echo). No isch/infarct.   Chronic back pain    Colon polyps    a. 07/2017 Colonoscopy: 5mm sigmoid polyp, tubular adenoma, diverticulosis, int hemorrhoids.   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 02/2019 Echo: EF 40-45%; b. 06/2019 Echo: EF 50-55%; c. 10/2022 Echo: EF 55-60%, no rwma, nl RV fxn, mild MR.   Hydrocele    Hyperlipidemia    Ischemic cardiomyopathy    a. 02/2019 Echo: EF 40-45%; b. 06/2019 Echo: EF 50-55%; c. 10/2022 Echo: EF 55-60%.   Myalgia    Osteoarthritis    C-spine   PAF (paroxysmal atrial fibrillation) (HCC)    a. CHA2DS2VASc = 3-->eliquis   Peripheral vascular disease (HCC)    a. 2018 s/p aorto-iliac stent graft; b. 11/2022  ABI/Duplex: R 1.24, L 1.02. Bilateral inflow dzs noted on duplex.   Reactive airway disease    Seborrheic keratosis    Sinus bradycardia    a. 02/2019 Jxnl rhythm and sinus brady in setting of Ant MI-->CCB d/c'd.   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   Tobacco use disorder    Tremor    Past Surgical History:  Procedure Laterality Date   [facility]    [facility]   [facility]COPY     COLONOSCOPY WITH PROPOFOL N/A 08/05/2017   Procedure: COLONOSCOPY WITH PROPOFOL;  Surgeon: Brian Spillers, MD;  Location: [facility] ENDOSCOPY;  Service: Endoscopy;  Laterality: N/A;   CORONARY/GRAFT ACUTE MI REVASCULARIZATION N/A 03/12/2019   Procedure: Coronary/Graft Acute MI Revascularization;  Surgeon: Mcclure, Brian M, MD;  Location: [facility] INVASIVE CV LAB;  Service: Cardiovascular;  Laterality: N/A;   ESOPHAGOGASTRODUODENOSCOPY (EGD) WITH PROPOFOL N/A 08/05/2017   Procedure: ESOPHAGOGASTRODUODENOSCOPY (EGD) WITH PROPOFOL;  Surgeon: Brian Spillers, MD;  Location: [facility] ENDOSCOPY;  Service: Endoscopy;  Laterality: N/A;   LEFT HEART CATH AND CORONARY ANGIOGRAPHY N/A 03/12/2019   Procedure: LEFT HEART CATH AND CORONARY ANGIOGRAPHY;  Surgeon: Mcclure, Brian M, MD;  Location: [facility] INVASIVE CV LAB;  Service: Cardiovascular;  Laterality: N/A;   NASAL SINUS SURGERY     several   SPLENECTOMY  01/2017    Allergies  Allergies  Allergen Reactions   Statins Other (See Comments)  Leg Cramps   Amoxicillin-Pot Clavulanate Diarrhea   Atorvastatin Other (See Comments)   Atorvastatin Calcium Other (See Comments)   Clavulanic Acid Diarrhea   Codeine Other (See Comments)   Menthol     Other Reaction(s): Congestion Nose   Nicotine Polacrilex    Pravastatin Other (See Comments)   Bupropion Other (See Comments)    Caused somnolence (and didn't help to stop smoking)   Flunisolide Other (See Comments)    didn't work  Other Reaction(s): Nasal congestion   Nicotine Other (See Comments)     didn't help stop smoking    History of Present Illness    67 year old male with above complex past medical history including CAD, ischemic cardiomyopathy, HFimpEF, LV apical thrombus, paroxysmal atrial fibrillation, peripheral vascular disease, sinus and junctional bradycardia, hyperlipidemia, COPD, and tobacco abuse.  In August 2020, he presented with anterior STEMI.  Diagnostic catheterization revealed a total occlusion of the proximal LAD and otherwise nonobstructive disease.  EF was 35 to 45% by ventriculography.  The LAD was successfully treated with a drug-eluting stent.  Follow-up echo showed persistent LV dysfunction with an EF of 40 to 45% and a small, fixed thrombus in the apical wall of left ventricle, and he was placed on Coumadin.  During hospitalization, he had junctional rhythm and sinus bradycardia resulting in discontinuation of home dose of diltiazem.  He subsequently developed atrial flutter in the outpatient setting and was placed on low-dose beta-blocker and was maintained on warfarin, which was later changed to Eliquis.    [name] subsequently transitioned his cardiology care to the [facility], prior to returning to our practice in April 2024.  At that time, he reported more exertional dyspnea and fatigue as well as episodic chest pain, and bilateral calf claudication.  Stress testing was undertaken and showed no evidence of ischemia but depressed EF at 38%.  This was followed by echocardiogram, which showed an EF of 55-60% w/o regional wall motion abnormalities.  Lower extremity ABIs were normal although duplex suggested some degree of in-stent restenosis and therefore, referral was made for him to follow-up with Dr. Kirke Mcclure.  Since his last visit, he has been participating in pulmonary rehab.  He walks on the treadmill for up to 40 minutes without significant claudication, chest pain, or dyspnea.  Further, he denies palpitations, PND, orthopnea, dizziness, syncope, edema, or early  satiety.  He has had leg muscle aches, which persist throughout the day and do not seem to change with activity.  He wonders if his rosuvastatin may be playing a role.  He continues to smoke 1 pack of cigarettes a day and is not currently contemplating quitting but he and his wife have discussed the need to develop a plan soon.  Home Medications    Current Outpatient Medications  Medication Sig Dispense Refill   apixaban (ELIQUIS) 5 MG TABS tablet TAKE ONE TABLET BY MOUTH EVERY 12 HOURS CAUTION BLOOD THINNER     aspirin 81 MG chewable tablet CHEW ONE TABLET BY MOUTH EVERY DAY     Ca Carbonate-Mag Hydroxide (ROLAIDS PO) Take 1 tablet by mouth at bedtime as needed (heartburn/acid reflux).     cholecalciferol (VITAMIN D) 25 MCG (1000 UT) tablet Take 1,000 Units by mouth 2 (two) times daily.     diclofenac sodium (VOLTAREN) 1 % GEL Apply 1 application  topically daily as needed (pain).     famotidine (PEPCID) 20 MG tablet Take 20 mg by mouth daily.     hydrocortisone cream 1 %  SMARTSIG:1 Topical Daily     levofloxacin (LEVAQUIN) 750 MG tablet TAKE ONE TABLET BY MOUTH ONCE FOR BACTEREMIA . DO NOT TAKE WITH DAIRY PRODUCTS;ANTACID;SUCRALFATE;IRON;ZINC. TAKE FOR FEVER MORE THAN 100.5 WHILE ON YOUR WAY TO CLOSEST EMERGENCY DEPARTMENT.     loratadine (CLARITIN) 10 MG tablet TAKE ONE TABLET BY MOUTH EVERY DAY FOR ALLERGIES     metoprolol succinate (TOPROL XL) 25 MG 24 hr tablet Take 0.5 tablets (12.5 mg total) by mouth daily. 45 tablet 3   nitroGLYCERIN (NITROSTAT) 0.4 MG SL tablet Place 1 tablet (0.4 mg total) under the tongue every 5 (five) minutes x 3 doses as needed for chest pain. 25 tablet 12   oxymetazoline (AFRIN) 0.05 % nasal spray Place 1 spray into both nostrils 2 (two) times daily as needed for congestion.     rosuvastatin (CRESTOR) 20 MG tablet TAKE ONE TABLET BY MOUTH AT BEDTIME FOR CHOLESTEROL     Simethicone (GAS-X PO) Take 1 tablet by mouth 3 (three) times daily as needed (heartburn).      simethicone (MYLICON) 80 MG chewable tablet CHEW ONE TABLET BY MOUTH TWO TIMES A DAY     STIOLTO RESPIMAT 2.5-2.5 MCG/ACT AERS INHALE 2 INHALATIONS BY ORAL INHALATION IN THE MORNING FOR CHRONIC OBSTRUCTIVE LUNGS     albuterol (VENTOLIN HFA) 108 (90 Base) MCG/ACT inhaler INHALE 2 PUFFS BY ORAL INHALATION EVERY 4 TO 6 HOURS AS NEEDED FOR CHRONIC OBSTRUCTIVE LUNG DISEASE BE SURE TO WASH MOUTHPIECE WITH WARM WATER ONCE A WEEK (Patient not taking: Reported on 10/28/2022)     losartan (COZAAR) 25 MG tablet Take 0.5 tablets (12.5 mg total) by mouth daily. 15 tablet 1   No current facility-administered medications for this visit.     Review of Systems    Muscle aches involving his legs.  He denies chest pain, dyspnea, palpitations, PND, orthopnea, dizziness, syncope, edema, claudication, or early satiety.  All other systems reviewed and are otherwise negative except as noted above.    Physical Exam    VS:  BP 114/66 (BP Location: Left Arm, Patient Position: Sitting, Cuff Size: Normal)   Pulse (!) 56   Ht 6\' 3"  (1.905 Mcclure)   Wt 180 lb (81.6 kg)   SpO2 97%   BMI 22.50 kg/Mcclure  , BMI Body mass index is 22.5 kg/Mcclure.     GEN: Well nourished, well developed, in no acute distress. HEENT: normal. Neck: Supple, no JVD, carotid bruits, or masses. Cardiac: RRR, no murmurs, rubs, or gallops. No clubbing, cyanosis, edema.  Radials 2+/PT 1+ and equal bilaterally.  Respiratory:  Respirations regular and unlabored, clear to auscultation bilaterally. GI: Soft, nontender, nondistended, BS + x 4. MS: no deformity or atrophy. Skin: warm and dry, no rash. Neuro:  Strength and sensation are intact. Psych: Normal affect.  Accessory Clinical Findings    ECG personally reviewed by me today -sinus bradycardia, 56- no acute changes.  Lab Results  Component Value Date   WBC 11.5 (H) 11/05/2021   HGB 13.7 11/05/2021   HCT 42.4 11/05/2021   MCV 94.2 11/05/2021   PLT 215 11/05/2021   Lab Results  Component Value  Date   CREATININE 1.13 11/05/2021   BUN 12 11/05/2021   NA 140 11/05/2021   K 3.6 11/05/2021   CL 106 11/05/2021   CO2 28 11/05/2021   Lab Results  Component Value Date   ALT 24 05/03/2019   AST 17 05/03/2019   ALKPHOS 73 05/03/2019   BILITOT 0.4 05/03/2019   Lab  Results  Component Value Date   CHOL 96 (L) 05/03/2019   HDL 34 (L) 05/03/2019   LDLCALC 46 05/03/2019   TRIG 79 05/03/2019   CHOLHDL 2.8 05/03/2019    Lab Results  Component Value Date   HGBA1C 6.0 (H) 03/12/2019    Assessment & Plan    1.  Coronary artery disease: History of anterior STEMI with LAD stenting in August 2020.  Recently seen in April with complaints of exercise intolerance and occasional chest discomfort.  Stress testing was undertaken and showed an EF of 38% without ischemia or infarct.  Subsequent echo showed normal LV function without regional wall motion abnormalities.  He denies any recurrent chest pain today.  He has been participating in cardiac rehab without significant dyspnea and notes that he is able to walk on the treadmill for about 40 minutes at 3.5 mph and a 7% grade.  I congratulated him on his efforts and encouraged him to continue with pulmonary rehab.  He remains on aspirin, beta-blocker, ARB.  We did discuss potentially discontinuing aspirin but as he is having lower extremity symptoms with recent abnormal arterial duplex pending PV eval by Dr. Kirke Mcclure, I will continue for the time being.  Holding statin for 1 to 2 weeks to see if leg symptoms improve.  2.  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  He is euvolemic on examination with normal blood pressure and heart rate.  Tolerating pulmonary rehab well.  He remains on beta-blocker and ARB therapy.  3.  Peripheral vascular disease: At last visit, complained of bilateral calf pain concerning for claudication.  He has a known history of aortoiliac disease  with prior interventions.  ABIs were normal however there was concern on duplex for inflow disease.  Today, he notes that he can walk about 40 minutes on a treadmill and around 15 minutes, his muscles feel tired but he does not have progressive claudication.  Follow-up was arranged with Dr. Kirke Mcclure.  Remains on aspirin.  Holding statin in the setting of leg aching, which is persistent throughout the day and less consistent with claudication symptoms.  If symptoms improve off of statin, will need to consider PCSK9 inhibitor.  4.  Paroxysmal atrial fibrillation: EKG today shows sinus bradycardia.  He remains on beta-blocker and Eliquis therapy.  5.  Hyperlipidemia: He allowed me to review recent lab work from the [place] on his phone, which showed an LDL less than 70 and otherwise stable labs.  As above, rosuvastatin on hold in the setting of leg aching.  He did have to come off of atorvastatin for similar symptoms in the past.  If symptoms improve, will likely need to switch to PCSK9 inhibitor.  6.  Tobacco abuse: Smoking 1 pack/day.  He is in the precontemplation stage of quitting and notes that he and his wife recognize that they need to quit and have discussed coming up with the timeline.  Complete cessation advised and assistance offered to help him quit if necessary.  7.  Disposition: Follow-up with Dr. Kirke Mcclure for PVD eval.  Follow-up with general cardiology in 3 months or sooner if necessary.   Nicolasa Ducking, NP 12/04/2022, 1:48 PM

## 2022-12-04 NOTE — Progress Notes (Signed)
Daily Session Note  Patient Details  Name: Brian Mcclure MRN: [id number] Date of Birth: 07/03/1956 Referring Provider:   Flowsheet Row Pulmonary Rehab from 09/30/2022 in [facility] Cardiac and Pulmonary Rehab  Referring Provider Vito Berger MD       Encounter Date: 12/04/2022  Check In:  Session Check In - 12/04/22 1100       Check-In   Supervising physician immediately available to respond to emergencies See telemetry face sheet for immediately available ER MD    Location [facility]-Cardiac & Pulmonary Rehab    Staff Present Lanny Hurst, RN, ADN;Jessica Juanetta Gosling, MA, RCEP, CCRP, CCET;Joseph [place], [place]    Virtual Visit No    Medication changes reported     Yes    Comments started new inhaler Stiloto    Fall or balance concerns reported    No    Tobacco Cessation No Change    Warm-up and Cool-down Performed on first and last piece of equipment    Resistance Training Performed Yes    VAD Patient? No    PAD/SET Patient? No      Pain Assessment   Currently in Pain? No/denies                Social History   Tobacco Use  Smoking Status Every Day   Packs/day: 1.00   Years: 40.00   Additional pack years: 0.00   Total pack years: 40.00   Types: Cigarettes  Smokeless Tobacco Never    Goals Met:  Independence with exercise equipment Exercise tolerated well No report of concerns or symptoms today Strength training completed today  Goals Unmet:  Not Applicable  Comments: Pt able to follow exercise prescription today without complaint.  Will continue to monitor for progression.    Dr. Bethann Punches is Medical Director for [facility] Cardiac Rehabilitation.  Dr. Vida Rigger is Medical Director for [facility] Pulmonary Rehabilitation.

## 2022-12-07 ENCOUNTER — Encounter: Payer: Medicare PPO | Admitting: *Deleted

## 2022-12-07 DIAGNOSIS — R0609 Other forms of dyspnea: Secondary | ICD-10-CM | POA: Diagnosis not present

## 2022-12-07 NOTE — Progress Notes (Signed)
Daily Session Note  Patient Details  Name: Brian Mcclure MRN: [id number] Date of Birth: 18-Jan-1956 Referring Provider:   Flowsheet Row Pulmonary Rehab from 09/30/2022 in [facility] Cardiac and Pulmonary Rehab  Referring Provider Vito Berger MD       Encounter Date: 12/07/2022  Check In:  Session Check In - 12/07/22 1118       Check-In   Supervising physician immediately available to respond to emergencies See telemetry face sheet for immediately available ER MD    Location [facility]-Cardiac & Pulmonary Rehab    Staff Present Lanny Hurst, RN, Franki Monte, BS, ACSM CEP, Exercise Physiologist;Meredith Jewel Baize, RN BSN;Noah Tickle, BS, Exercise Physiologist    Virtual Visit No    Medication changes reported     No    Fall or balance concerns reported    No    Tobacco Cessation No Change    Warm-up and Cool-down Performed on first and last piece of equipment    Resistance Training Performed Yes    VAD Patient? No    PAD/SET Patient? No      Pain Assessment   Currently in Pain? No/denies                Social History   Tobacco Use  Smoking Status Every Day   Packs/day: 1.00   Years: 40.00   Additional pack years: 0.00   Total pack years: 40.00   Types: Cigarettes  Smokeless Tobacco Never    Goals Met:  Independence with exercise equipment Exercise tolerated well No report of concerns or symptoms today Strength training completed today  Goals Unmet:  Not Applicable  Comments: Pt able to follow exercise prescription today without complaint.  Will continue to monitor for progression.    Dr. Bethann Punches is Medical Director for [facility] Cardiac Rehabilitation.  Dr. Vida Rigger is Medical Director for [facility] Pulmonary Rehabilitation.

## 2022-12-11 ENCOUNTER — Encounter: Payer: Medicare PPO | Admitting: *Deleted

## 2022-12-11 DIAGNOSIS — R0609 Other forms of dyspnea: Secondary | ICD-10-CM | POA: Diagnosis not present

## 2022-12-11 NOTE — Progress Notes (Signed)
Daily Session Note  Patient Details  Name: Brian Mcclure MRN: [id number] Date of Birth: Dec 02, 1955 Referring Provider:   Flowsheet Row Pulmonary Rehab from 09/30/2022 in [facility] Cardiac and Pulmonary Rehab  Referring Provider Vito Berger MD       Encounter Date: 12/11/2022  Check In:  Session Check In - 12/11/22 1151       Check-In   Supervising physician immediately available to respond to emergencies See telemetry face sheet for immediately available ER MD    Location [facility]-Cardiac & Pulmonary Rehab    Staff Present Cora Collum, RN, BSN, CCRP;Jessica [place], MA, RCEP, CCRP, CCET;Joseph [place], [place]    Virtual Visit No    Medication changes reported     No    Fall or balance concerns reported    No    Warm-up and Cool-down Performed on first and last piece of equipment    Resistance Training Performed Yes    VAD Patient? No    PAD/SET Patient? No      Pain Assessment   Currently in Pain? No/denies                Social History   Tobacco Use  Smoking Status Every Day   Packs/day: 1.00   Years: 40.00   Additional pack years: 0.00   Total pack years: 40.00   Types: Cigarettes  Smokeless Tobacco Never    Goals Met:  Proper associated with RPD/PD & O2 Sat Independence with exercise equipment Exercise tolerated well No report of concerns or symptoms today  Goals Unmet:  Not Applicable  Comments: Pt able to follow exercise prescription today without complaint.  Will continue to monitor for progression.    Dr. Bethann Punches is Medical Director for [facility] Cardiac Rehabilitation.  Dr. Vida Rigger is Medical Director for [facility] Pulmonary Rehabilitation.

## 2022-12-16 ENCOUNTER — Ambulatory Visit: Payer: Medicare PPO

## 2022-12-18 ENCOUNTER — Encounter: Payer: Medicare PPO | Admitting: *Deleted

## 2022-12-18 DIAGNOSIS — R0609 Other forms of dyspnea: Secondary | ICD-10-CM | POA: Diagnosis not present

## 2022-12-18 NOTE — Progress Notes (Signed)
Daily Session Note  Patient Details  Name: Brian Mcclure MRN: [id number] Date of Birth: [birth year] Referring Provider:   Flowsheet Row Pulmonary Rehab from 09/30/2022 in [facility] Cardiac and Pulmonary Rehab  Referring Provider Vito Berger MD       Encounter Date: 12/18/2022  Check In:  Session Check In - 12/18/22 1152       Check-In   Supervising physician immediately available to respond to emergencies See telemetry face sheet for immediately available ER MD    Location [facility]-Cardiac & Pulmonary Rehab    Staff Present Cora Collum, RN, BSN, CCRP;Jessica [place], MA, RCEP, CCRP, CCET;Joseph Sherman, [place]    Virtual Visit No    Medication changes reported     No    Fall or balance concerns reported    No    Tobacco Cessation Use Decreased    Current number of cigarettes/nicotine per day     12    Warm-up and Cool-down Performed on first and last piece of equipment    Resistance Training Performed Yes    VAD Patient? No    PAD/SET Patient? No      Pain Assessment   Currently in Pain? No/denies                Social History   Tobacco Use  Smoking Status Every Day   Packs/day: 1.00   Years: 40.00   Additional pack years: 0.00   Total pack years: 40.00   Types: Cigarettes  Smokeless Tobacco Never    Goals Met:  Proper associated with RPD/PD & O2 Sat Independence with exercise equipment Exercise tolerated well No report of concerns or symptoms today  Goals Unmet:  Not Applicable  Comments: Pt able to follow exercise prescription today without complaint.  Will continue to monitor for progression.    Dr. Bethann Punches is Medical Director for [facility] Cardiac Rehabilitation.  Dr. Vida Rigger is Medical Director for [facility] Pulmonary Rehabilitation.

## 2022-12-21 ENCOUNTER — Encounter: Payer: No Typology Code available for payment source | Attending: Registered Nurse | Admitting: *Deleted

## 2022-12-21 DIAGNOSIS — R0609 Other forms of dyspnea: Secondary | ICD-10-CM | POA: Insufficient documentation

## 2022-12-21 NOTE — Progress Notes (Deleted)
Daily Session Note  Patient Details  Name: BERNETT KOMARA MRN: [id number] Date of Birth: 19-Jan-1956 Referring Provider:   Flowsheet Row Pulmonary Rehab from 09/30/2022 in [facility] Cardiac and Pulmonary Rehab  Referring Provider Vito Berger MD       Encounter Date: 12/21/2022  Check In:  Session Check In - 12/21/22 1113       Check-In   Supervising physician immediately available to respond to emergencies See telemetry face sheet for immediately available ER MD    Location [facility]-Cardiac & Pulmonary Rehab    Staff Present Lanny Hurst, RN, Franki Monte, BS, ACSM CEP, Exercise Physiologist;Jessica Juanetta [place], MA, RCEP, CCRP, CCET;Noah Tickle, BS, Exercise Physiologist    Virtual Visit No    Medication changes reported     No    Fall or balance concerns reported    No    Tobacco Cessation No Change    Warm-up and Cool-down Performed on first and last piece of equipment    Resistance Training Performed Yes    VAD Patient? No    PAD/SET Patient? No      Pain Assessment   Currently in Pain? No/denies                Social History   Tobacco Use  Smoking Status Every Day   Packs/day: 1.00   Years: 40.00   Additional pack years: 0.00   Total pack years: 40.00   Types: Cigarettes  Smokeless Tobacco Never    Goals Met:  Independence with exercise equipment Exercise tolerated well No report of concerns or symptoms today Strength training completed today  Goals Unmet:  Not Applicable  Comments: Pt able to follow exercise prescription today without complaint.  Will continue to monitor for progression.    Dr. Bethann Punches is Medical Director for [facility] Cardiac Rehabilitation.  Dr. Vida Rigger is Medical Director for [facility] Pulmonary Rehabilitation.

## 2022-12-21 NOTE — Progress Notes (Signed)
Daily Session Note  Patient Details  Name: Brian Mcclure MRN: [id number] Date of Birth: 1955-11-07 Referring Provider:   Flowsheet Row Pulmonary Rehab from 09/30/2022 in [facility] Cardiac and Pulmonary Rehab  Referring Provider Vito Berger MD       Encounter Date: 12/21/2022  Check In:  Session Check In - 12/21/22 1113       Check-In   Supervising physician immediately available to respond to emergencies See telemetry face sheet for immediately available ER MD    Location [facility]-Cardiac & Pulmonary Rehab    Staff Present Lanny Hurst, RN, Franki Monte, BS, ACSM CEP, Exercise Physiologist;Jessica [place], MA, RCEP, CCRP, CCET;Noah Tickle, BS, Exercise Physiologist    Virtual Visit No    Medication changes reported     Yes    Comments restarted crestor at reduced dose 10 mg daily    Fall or balance concerns reported    No    Tobacco Cessation No Change    Current number of cigarettes/nicotine per day     12    Warm-up and Cool-down Performed on first and last piece of equipment    Resistance Training Performed Yes    VAD Patient? No    PAD/SET Patient? No      Pain Assessment   Currently in Pain? No/denies                Social History   Tobacco Use  Smoking Status Every Day   Packs/day: 1.00   Years: 40.00   Additional pack years: 0.00   Total pack years: 40.00   Types: Cigarettes  Smokeless Tobacco Never    Goals Met:  Independence with exercise equipment Exercise tolerated well No report of concerns or symptoms today Strength training completed today  Goals Unmet:  Not Applicable  Comments: Pt able to follow exercise prescription today without complaint.  Will continue to monitor for progression.    Dr. Bethann Punches is Medical Director for [facility] Cardiac Rehabilitation.  Dr. Vida Rigger is Medical Director for [facility] Pulmonary Rehabilitation.

## 2022-12-22 ENCOUNTER — Other Ambulatory Visit: Payer: Self-pay | Admitting: *Deleted

## 2022-12-22 MED ORDER — EZETIMIBE 10 MG PO TABS: 10.0000 mg | ORAL_TABLET | Freq: Every day | ORAL | 3 refills | Status: DC

## 2022-12-25 ENCOUNTER — Encounter: Payer: No Typology Code available for payment source | Admitting: *Deleted

## 2022-12-25 DIAGNOSIS — R0609 Other forms of dyspnea: Secondary | ICD-10-CM | POA: Diagnosis not present

## 2022-12-25 NOTE — Progress Notes (Signed)
Daily Session Note  Patient Details  Name: Brian Mcclure MRN: [id number] Date of Birth: Apr 01, 1956 Referring Provider:   Flowsheet Row Pulmonary Rehab from 09/30/2022 in [facility] Cardiac and Pulmonary Rehab  Referring Provider Vito Berger MD       Encounter Date: 12/25/2022  Check In:  Session Check In - 12/25/22 1154       Check-In   Supervising physician immediately available to respond to emergencies See telemetry face sheet for immediately available ER MD    Location [facility]-Cardiac & Pulmonary Rehab    Staff Present Cora Collum, RN, BSN, CCRP;Joseph [place], RCP,RRT,BSRT;Jessica [place], [place], RCEP, CCRP, CCET    Virtual Visit No    Medication changes reported     No    Warm-up and Cool-down Performed on first and last piece of equipment    Resistance Training Performed Yes    VAD Patient? No    PAD/SET Patient? No      Pain Assessment   Currently in Pain? No/denies                Social History   Tobacco Use  Smoking Status Every Day   Packs/day: 1.00   Years: 40.00   Additional pack years: 0.00   Total pack years: 40.00   Types: Cigarettes  Smokeless Tobacco Never    Goals Met:  Proper associated with RPD/PD & O2 Sat Independence with exercise equipment Exercise tolerated well No report of concerns or symptoms today  Goals Unmet:  Not Applicable  Comments: Pt able to follow exercise prescription today without complaint.  Will continue to monitor for progression.    Dr. Bethann Punches is Medical Director for [facility] Cardiac Rehabilitation.  Dr. Vida Rigger is Medical Director for [facility] Pulmonary Rehabilitation.

## 2022-12-30 ENCOUNTER — Encounter: Payer: Self-pay | Admitting: *Deleted

## 2022-12-30 DIAGNOSIS — R0609 Other forms of dyspnea: Secondary | ICD-10-CM

## 2022-12-30 NOTE — Progress Notes (Signed)
Pulmonary Individual Treatment Plan  Patient Details  Name: Brian Mcclure MRN: [id number] Date of Birth: 1956/05/01 Referring Provider:   Flowsheet Row Pulmonary Rehab from 09/30/2022 in [facility] Cardiac and Pulmonary Rehab  Referring Provider Vito Berger MD       Initial Encounter Date:  Flowsheet Row Pulmonary Rehab from 09/30/2022 in [facility] Cardiac and Pulmonary Rehab  Date 09/30/22       Visit Diagnosis: Dyspnea on exertion  Patient's Home Medications on Admission:  Current Outpatient Medications:    albuterol (VENTOLIN HFA) 108 (90 Base) MCG/ACT inhaler, INHALE 2 PUFFS BY ORAL INHALATION EVERY 4 TO 6 HOURS AS NEEDED FOR CHRONIC OBSTRUCTIVE LUNG DISEASE BE SURE TO WASH MOUTHPIECE WITH WARM WATER ONCE A WEEK (Patient not taking: Reported on 10/28/2022), Disp: , Rfl:    apixaban (ELIQUIS) 5 MG TABS tablet, TAKE ONE TABLET BY MOUTH EVERY 12 HOURS CAUTION BLOOD THINNER, Disp: , Rfl:    aspirin 81 MG chewable tablet, CHEW ONE TABLET BY MOUTH EVERY DAY, Disp: , Rfl:    Ca Carbonate-Mag Hydroxide (ROLAIDS PO), Take 1 tablet by mouth at bedtime as needed (heartburn/acid reflux)., Disp: , Rfl:    cholecalciferol (VITAMIN D) 25 MCG (1000 UT) tablet, Take 1,000 Units by mouth 2 (two) times daily., Disp: , Rfl:    diclofenac sodium (VOLTAREN) 1 % GEL, Apply 1 application  topically daily as needed (pain)., Disp: , Rfl:    ezetimibe (ZETIA) 10 MG tablet, Take 1 tablet (10 mg total) by mouth daily., Disp: 90 tablet, Rfl: 3   famotidine (PEPCID) 20 MG tablet, Take 20 mg by mouth daily., Disp: , Rfl:    hydrocortisone cream 1 %, SMARTSIG:1 Topical Daily, Disp: , Rfl:    levofloxacin (LEVAQUIN) 750 MG tablet, TAKE ONE TABLET BY MOUTH ONCE FOR BACTEREMIA . DO NOT TAKE WITH DAIRY PRODUCTS;ANTACID;SUCRALFATE;IRON;ZINC. TAKE FOR FEVER MORE THAN 100.5 WHILE ON YOUR WAY TO CLOSEST EMERGENCY DEPARTMENT., Disp: , Rfl:    loratadine (CLARITIN) 10 MG tablet, TAKE ONE TABLET BY MOUTH EVERY DAY FOR  ALLERGIES, Disp: , Rfl:    losartan (COZAAR) 25 MG tablet, Take 0.5 tablets (12.5 mg total) by mouth daily., Disp: 15 tablet, Rfl: 1   metoprolol succinate (TOPROL XL) 25 MG 24 hr tablet, Take 0.5 tablets (12.5 mg total) by mouth daily., Disp: 45 tablet, Rfl: 3   nitroGLYCERIN (NITROSTAT) 0.4 MG SL tablet, Place 1 tablet (0.4 mg total) under the tongue every 5 (five) minutes x 3 doses as needed for chest pain., Disp: 25 tablet, Rfl: 12   oxymetazoline (AFRIN) 0.05 % nasal spray, Place 1 spray into both nostrils 2 (two) times daily as needed for congestion., Disp: , Rfl:    rosuvastatin (CRESTOR) 20 MG tablet, TAKE ONE TABLET BY MOUTH AT BEDTIME FOR CHOLESTEROL, Disp: , Rfl:    Simethicone (GAS-X PO), Take 1 tablet by mouth 3 (three) times daily as needed (heartburn)., Disp: , Rfl:    simethicone (MYLICON) 80 MG chewable tablet, CHEW ONE TABLET BY MOUTH TWO TIMES A DAY, Disp: , Rfl:    STIOLTO RESPIMAT 2.5-2.5 MCG/ACT AERS, INHALE 2 INHALATIONS BY ORAL INHALATION IN THE MORNING FOR CHRONIC OBSTRUCTIVE LUNGS, Disp: , Rfl:   Past Medical History: Past Medical History:  Diagnosis Date   Actinic keratosis    Acute ST elevation myocardial infarction (STEMI) of anterior wall (HCC)    a. 02/2019 s/p PCI/DES ->LAD.   Apical mural thrombus following MI (HCC)    a. 02/2019 s/p Ant MI-->Echo: Small, fixed  thrombus on the apical wall of LV-->coumadin.   Barrett's esophagus    CAD (coronary artery disease)    a. 02/2019 Ant STEMI/PCI: LM nl, LAD 100p (4x18 Resolute Onyx DES), LCX/RCA min irregs, EF 35-45%; b. 10/2022 MV: EF 38% (nl by echo). No isch/infarct.   Chronic back pain    Colon polyps    a. 07/2017 Colonoscopy: 5mm sigmoid polyp, tubular adenoma, diverticulosis, int hemorrhoids.   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 02/2019 Echo: EF 40-45%; b. 06/2019 Echo: EF 50-55%; c. 10/2022 Echo: EF 55-60%, no rwma, nl RV fxn, mild MR.   Hydrocele    Hyperlipidemia    Ischemic cardiomyopathy    a. 02/2019 Echo: EF 40-45%; b. 06/2019 Echo: EF 50-55%; c. 10/2022 Echo: EF 55-60%.   Myalgia    Osteoarthritis    C-spine   PAF (paroxysmal atrial fibrillation) (HCC)    a. CHA2DS2VASc = 3-->eliquis   Peripheral vascular disease (HCC)    a. 2018 s/p aorto-iliac stent graft; b. 11/2022 ABI/Duplex: R 1.24, L 1.02. Bilateral inflow dzs noted on duplex.   Reactive airway disease    Seborrheic keratosis    Sinus bradycardia    a. 02/2019 Jxnl rhythm and sinus brady in setting of Ant MI-->[facility] d/c'd.   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   Tobacco use disorder    Tremor     Tobacco Use: Social History   Tobacco Use  Smoking Status Every Day   Packs/day: 1.00   Years: 40.00   Additional pack years: 0.00   Total pack years: 40.00   Types: Cigarettes  Smokeless Tobacco Never    Labs: Review Flowsheet       Latest Ref Rng & Units 03/12/2019 05/03/2019  Labs for ITP Cardiac and Pulmonary Rehab  Cholestrol 100 - 199 mg/dL 244  96   LDL (calc) 0 - 99 mg/dL 59  46   HDL-C >01 mg/dL 31  34   Trlycerides 0 - 149 mg/dL 70  79   Hemoglobin U2V 4.8 - 5.6 % 6.0  -  TCO2 22 - 32 mmol/L 18  -     Pulmonary Assessment Scores:  Pulmonary Assessment Scores     Row Name 09/30/22 1517         ADL UCSD   ADL Phase Entry     SOB Score total 28     Rest 0     Walk 1     Stairs 3     Bath 1     Dress 1     Shop 1       CAT Score   CAT Score 9       mMRC Score   mMRC Score 1              UCSD: Self-administered rating of dyspnea associated with activities of daily living (ADLs) 6-point scale (0 = "not at all" to 5 = "maximal or unable to do because of breathlessness")  Scoring Scores range from 0 to 120.  Minimally important difference is 5 units  CAT: CAT can identify the  health impairment of COPD patients and is better correlated with disease progression.  CAT has a scoring range of zero to 40. The CAT score is classified into four  groups of low (less than 10), medium (10 - 20), high (21-30) and very high (31-40) based on the impact level of disease on health status. A CAT score over 10 suggests significant symptoms.  A worsening CAT score could be explained by an exacerbation, poor medication adherence, poor inhaler technique, or progression of COPD or comorbid conditions.  CAT MCID is 2 points  mMRC: mMRC (Modified Medical Research Council) Dyspnea Scale is used to assess the degree of baseline functional disability in patients of respiratory disease due to dyspnea. No minimal important difference is established. A decrease in score of 1 point or greater is considered a positive change.   Pulmonary Function Assessment:  Pulmonary Function Assessment - 09/28/22 1117       Breath   Shortness of Breath Yes;Limiting activity             Exercise Target Goals: Exercise Program Goal: Individual exercise prescription set using results from initial 6 min walk test and THRR while considering  patient's activity barriers and safety.   Exercise Prescription Goal: Initial exercise prescription builds to 30-45 minutes a day of aerobic activity, 2-3 days per week.  Home exercise guidelines will be given to patient during program as part of exercise prescription that the participant will acknowledge.  Education: Aerobic Exercise: - Group verbal and visual presentation on the components of exercise prescription. Introduces F.I.T.T principle from ACSM for exercise prescriptions.  Reviews F.I.T.T. principles of aerobic exercise including progression. Written material given at graduation. Flowsheet Row Pulmonary Rehab from 09/30/2022 in [facility] Cardiac and Pulmonary Rehab  Education need identified 09/30/22       Education: Resistance Exercise: - Group verbal and  visual presentation on the components of exercise prescription. Introduces F.I.T.T principle from ACSM for exercise prescriptions  Reviews F.I.T.T. principles of resistance exercise including progression. Written material given at graduation.    Education: Exercise & Equipment Safety: - Individual verbal instruction and demonstration of equipment use and safety with use of the equipment. Flowsheet Row Pulmonary Rehab from 09/30/2022 in [facility] Cardiac and Pulmonary Rehab  Date 09/28/22  Educator [facility]  Instruction Review Code 1- Verbalizes Understanding       Education: Exercise Physiology & General Exercise Guidelines: - Group verbal and written instruction with models to review the exercise physiology of the cardiovascular system and associated critical values. Provides general exercise guidelines with specific guidelines to those with heart or lung disease.    Education: Flexibility, Balance, Mind/Body Relaxation: - Group verbal and visual presentation with interactive activity on the components of exercise prescription. Introduces F.I.T.T principle from ACSM for exercise prescriptions. Reviews F.I.T.T. principles of flexibility and balance exercise training including progression. Also discusses the mind body connection.  Reviews various relaxation techniques to help reduce and manage stress (i.e. Deep breathing, progressive muscle relaxation, and visualization). Balance handout provided to take home. Written material given at graduation.   Activity Barriers & Risk Stratification:  Activity Barriers & Cardiac Risk Stratification - 09/30/22 1541       Activity Barriers & Cardiac Risk Stratification   Activity Barriers Joint Problems;Back Problems;Muscular Weakness;Deconditioning;Shortness of Breath;Neck/Spine Problems             6 Minute Walk:  6 Minute Walk     Row Name 09/30/22 1537         6 Minute Walk   Phase Initial     Distance 1730 feet     Walk Time 6 minutes     #  of Rest Breaks 0  MPH 3.28     METS 4.24     RPE 13     Perceived Dyspnea  0     VO2 Peak 14.84     Symptoms Yes (comment)     Comments leg tightness     Resting HR 51 bpm     Resting BP 98/52     Resting Oxygen Saturation  97 %     Exercise Oxygen Saturation  during 6 min walk 98 %     Max Ex. HR 84 bpm     Max Ex. BP 142/60     2 Minute Post BP 96/58       Interval HR   1 Minute HR 65     2 Minute HR 78     3 Minute HR 77     4 Minute HR 82     5 Minute HR 82     6 Minute HR 84     2 Minute Post HR 54     Interval Heart Rate? Yes       Interval Oxygen   Interval Oxygen? Yes     Baseline Oxygen Saturation % 97 %     1 Minute Oxygen Saturation % 97 %     1 Minute Liters of Oxygen 0 L  RA     2 Minute Oxygen Saturation % 97 %     2 Minute Liters of Oxygen 0 L  RA     3 Minute Oxygen Saturation % 97 %     3 Minute Liters of Oxygen 0 L  RA     4 Minute Oxygen Saturation % 98 %     4 Minute Liters of Oxygen 0 L  RA     5 Minute Oxygen Saturation % 98 %     5 Minute Liters of Oxygen 0 L  RA     6 Minute Oxygen Saturation % 97 %     6 Minute Liters of Oxygen 0 L  RA     2 Minute Post Oxygen Saturation % 98 %     2 Minute Post Liters of Oxygen 0 L  RA             Oxygen Initial Assessment:  Oxygen Initial Assessment - 09/28/22 1117       Home Oxygen   Home Oxygen Device None    Sleep Oxygen Prescription None    Home Exercise Oxygen Prescription None    Home Resting Oxygen Prescription None      Initial 6 min Walk   Oxygen Used None      Program Oxygen Prescription   Program Oxygen Prescription None      Intervention   Short Term Goals To learn and demonstrate proper use of respiratory medications;To learn and understand importance of maintaining oxygen saturations>88%;To learn and demonstrate proper pursed lip breathing techniques or other breathing techniques. ;To learn and understand importance of monitoring SPO2 with pulse oximeter and demonstrate  accurate use of the pulse oximeter.    Long  Term Goals Maintenance of O2 saturations>88%;Compliance with respiratory medication;Demonstrates proper use of MDI's;Exhibits proper breathing techniques, such as pursed lip breathing or other method taught during program session;Verbalizes importance of monitoring SPO2 with pulse oximeter and return demonstration             Oxygen Re-Evaluation:  Oxygen Re-Evaluation     Row Name 10/05/22 1401 10/23/22 1113 11/23/22 1007 12/18/22 1124       Program  Oxygen Prescription   Program Oxygen Prescription -- None None None      Home Oxygen   Home Oxygen Device -- None None None    Sleep Oxygen Prescription -- None None None    Home Exercise Oxygen Prescription -- None None None    Home Resting Oxygen Prescription -- None None None      Goals/Expected Outcomes   Short Term Goals -- To learn and understand importance of maintaining oxygen saturations>88%;To learn and demonstrate proper pursed lip breathing techniques or other breathing techniques.  To learn and understand importance of monitoring SPO2 with pulse oximeter and demonstrate accurate use of the pulse oximeter.;To learn and understand importance of maintaining oxygen saturations>88%;To learn and demonstrate proper pursed lip breathing techniques or other breathing techniques.  To learn and understand importance of maintaining oxygen saturations>88%;To learn and understand importance of monitoring SPO2 with pulse oximeter and demonstrate accurate use of the pulse oximeter.    Long  Term Goals -- Exhibits proper breathing techniques, such as pursed lip breathing or other method taught during program session Verbalizes importance of monitoring SPO2 with pulse oximeter and return demonstration;Maintenance of O2 saturations>88%;Exhibits proper breathing techniques, such as pursed lip breathing or other method taught during program session Maintenance of O2 saturations>88%;Verbalizes importance of  monitoring SPO2 with pulse oximeter and return demonstration    Comments Reviewed PLB technique with pt.  Talked about how it works and it's importance in maintaining their exercise saturations. Informed patient how to perform the Pursed Lipped breathing technique. Told patient to Inhale through the nose and out the mouth with pursed lips to keep their airways open, help oxygenate them better, practice when at rest or doing strenuous activity. Patient Verbalizes understanding of technique and will work on and be reiterated during [name]. Cletis Athens is doing well in rehab.  His saturations are doing well and he has been using PLB to help with breathing. Cletis Athens has a pulse oximeter to check his oxygen saturation at home. Informed and explained why it is important to have one. Reviewed that oxygen saturations should be 88 percent and above.    Goals/Expected Outcomes Short: Become more profiecient at using PLB.   Long: Become independent at using PLB. Short: use PLB with exertion. Long: use PLB on exertion proficiently and independently. Short: use PLB with exertion. Long: use PLB on exertion proficiently and independently and monitor saturations Short: monitor oxygen at home with exertion. Long: maintain oxygen saturations above 88 percent independently.             Oxygen Discharge (Final Oxygen Re-Evaluation):  Oxygen Re-Evaluation - 12/18/22 1124       Program Oxygen Prescription   Program Oxygen Prescription None      Home Oxygen   Home Oxygen Device None    Sleep Oxygen Prescription None    Home Exercise Oxygen Prescription None    Home Resting Oxygen Prescription None      Goals/Expected Outcomes   Short Term Goals To learn and understand importance of maintaining oxygen saturations>88%;To learn and understand importance of monitoring SPO2 with pulse oximeter and demonstrate accurate use of the pulse oximeter.    Long  Term Goals Maintenance of O2 saturations>88%;Verbalizes importance of  monitoring SPO2 with pulse oximeter and return demonstration    Comments Cletis Athens has a pulse oximeter to check his oxygen saturation at home. Informed and explained why it is important to have one. Reviewed that oxygen saturations should be 88 percent and above.    Goals/Expected  Outcomes Short: monitor oxygen at home with exertion. Long: maintain oxygen saturations above 88 percent independently.             Initial Exercise Prescription:  Initial Exercise Prescription - 09/30/22 1500       Date of Initial Exercise RX and Referring Provider   Date 09/30/22    Referring Provider Vito Berger MD      Oxygen   Maintain Oxygen Saturation 88% or higher      Treadmill   MPH 3    Grade 1    Minutes 15    METs 3.71      Recumbant Bike   Level 3    Watts 57    Minutes 15    METs 4.24      NuStep   Level 4    Minutes 15    METs 4.24      Prescription Details   Frequency (times per week) 2    Duration Progress to 30 minutes of continuous aerobic without signs/symptoms of physical distress      Intensity   THRR 40-80% of Max Heartrate 92-133    Ratings of Perceived Exertion 11-13    Perceived Dyspnea 0-4      Progression   Progression Continue to progress workloads to maintain intensity without signs/symptoms of physical distress.      Resistance Training   Training Prescription Yes    Weight 4 lb    Reps 10-15             Perform Capillary Blood Glucose checks as needed.  Exercise Prescription Changes:   Exercise Prescription Changes     Row Name 09/30/22 1500 10/13/22 1400 10/29/22 1300 10/30/22 1100 11/10/22 1500     Response to Exercise   Blood Pressure (Admit) 98/52 108/60 94/60 -- 108/64   Blood Pressure (Exercise) 142/60 124/68 148/62 -- --   Blood Pressure (Exit) 96/58 104/62 102/62 -- 98/56   Heart Rate (Admit) 51 bpm 55 bpm 49 bpm -- 49 bpm   Heart Rate (Exercise) 84 bpm 85 bpm 111 bpm -- 88 bpm   Heart Rate (Exit) 54 bpm 62 bpm 63 bpm -- 59  bpm   Oxygen Saturation (Admit) 97 % 98 % 98 % -- 97 %   Oxygen Saturation (Exercise) 97 % 92 % 95 % -- 93 %   Oxygen Saturation (Exit) 98 % 97 % 96 % -- 96 %   Rating of Perceived Exertion (Exercise) 13 13 14  -- 12   Perceived Dyspnea (Exercise) 0 1 0 -- 0   Symptoms leg tightness slight SOB none -- none   Comments Results 2nd full day of exercise -- -- --   Duration -- Continue with 30 min of aerobic exercise without signs/symptoms of physical distress. Continue with 30 min of aerobic exercise without signs/symptoms of physical distress. -- Continue with 30 min of aerobic exercise without signs/symptoms of physical distress.   Intensity -- THRR unchanged THRR unchanged -- THRR unchanged     Progression   Progression -- Continue to progress workloads to maintain intensity without signs/symptoms of physical distress. Continue to progress workloads to maintain intensity without signs/symptoms of physical distress. -- Continue to progress workloads to maintain intensity without signs/symptoms of physical distress.   Average METs -- 4.84 5.92 -- 6.59     Resistance Training   Training Prescription -- Yes Yes -- Yes   Weight -- 6 lb 6 lb -- 6 lb   Reps -- 10-15  10-15 -- 10-15     Interval Training   Interval Training -- No No -- No     Treadmill   MPH -- 3.8 4 -- 3.7   Grade -- 1 5 -- 5.5   Minutes -- 15 30 -- 15   METs -- 4.43 6.82 -- 6.64     Recumbant Elliptical   Level -- 11 -- -- --   Minutes -- 15 -- -- --   METs -- 6.2 -- -- --     REL-XR   Level -- -- 12 -- 13   Minutes -- -- 15 -- 15   METs -- -- 7.8 -- 9     T5 Nustep   Level -- -- 5 -- --   Minutes -- -- 15 -- --   METs -- -- 4 -- --     Home Exercise Plan   Plans to continue exercise at -- -- -- Home (comment)  walking, working in [employer] (comment)  walking, working in garden   Frequency -- -- -- Add 2 additional days to program exercise sessions. Add 2 additional days to program exercise sessions.    Initial Home Exercises Provided -- -- -- 10/30/22 10/30/22     Oxygen   Maintain Oxygen Saturation -- -- 88% or higher -- 88% or higher    Row Name 11/24/22 0800 12/08/22 1400 12/23/22 1400         Response to Exercise   Blood Pressure (Admit) 112/58 124/70 100/60     Blood Pressure (Exit) 108/56 100/54 124/60     Heart Rate (Admit) 52 bpm 74 bpm 51 bpm     Heart Rate (Exercise) 81 bpm 100 bpm 93 bpm     Heart Rate (Exit) 65 bpm 69 bpm 59 bpm     Oxygen Saturation (Admit) 96 % 98 % 97 %     Oxygen Saturation (Exercise) 95 % 95 % 95 %     Oxygen Saturation (Exit) 97 % 96 % 97 %     Rating of Perceived Exertion (Exercise) 12 14 14      Perceived Dyspnea (Exercise) 0 1 0     Symptoms none none none     Duration Continue with 30 min of aerobic exercise without signs/symptoms of physical distress. Continue with 30 min of aerobic exercise without signs/symptoms of physical distress. Continue with 30 min of aerobic exercise without signs/symptoms of physical distress.     Intensity THRR unchanged THRR unchanged THRR unchanged       Progression   Progression Continue to progress workloads to maintain intensity without signs/symptoms of physical distress. Continue to progress workloads to maintain intensity without signs/symptoms of physical distress. Continue to progress workloads to maintain intensity without signs/symptoms of physical distress.     Average METs 4.77 5.66 5.85       Resistance Training   Training Prescription Yes Yes Yes     Weight 6 lb 6 lb 6 lb     Reps 10-15 10-15 10-15       Interval Training   Interval Training No No No       Treadmill   MPH 3.4 3.4  up to 3.8 3.4     Grade 2.5 6  up to 6.5 7     Minutes 30 30 30      METs 4.77 7.9 6.88       NuStep   Level -- 4 --     Minutes -- 15 --  METs -- 5.7 --       T5 Nustep   Level -- -- 4     Minutes -- -- 30     METs -- -- 2.2       Home Exercise Plan   Plans to continue exercise at Home (comment)   walking, working in garden Home (comment)  walking, working in [employer] (comment)  walking, working in garden     Frequency Add 2 additional days to program exercise sessions. Add 2 additional days to program exercise sessions. Add 2 additional days to program exercise sessions.     Initial Home Exercises Provided 10/30/22 10/30/22 10/30/22       Oxygen   Maintain Oxygen Saturation 88% or higher 88% or higher 88% or higher              Exercise Comments:   Exercise Comments     Row Name 10/05/22 1401           Exercise Comments First full day of exercise!  Patient was oriented to gym and equipment including functions, settings, policies, and procedures.  Patient's individual exercise prescription and treatment plan were reviewed.  All starting workloads were established based on the results of the 6 minute walk test done at initial orientation visit.  The plan for exercise progression was also introduced and progression will be customized based on patient's performance and goals.                Exercise Goals and Review:   Exercise Goals     Row Name 09/30/22 1541             Exercise Goals   Increase Physical Activity Yes       Intervention Provide advice, education, support and counseling about physical activity/exercise needs.;Develop an individualized exercise prescription for aerobic and resistive training based on initial evaluation findings, risk stratification, comorbidities and participant's personal goals.       Expected Outcomes Short Term: Attend rehab on a regular basis to increase amount of physical activity.;Long Term: Add in home exercise to make exercise part of routine and to increase amount of physical activity.;Long Term: Exercising regularly at least 3-5 days a week.       Increase Strength and Stamina Yes       Intervention Provide advice, education, support and counseling about physical activity/exercise needs.;Develop an individualized exercise  prescription for aerobic and resistive training based on initial evaluation findings, risk stratification, comorbidities and participant's personal goals.       Expected Outcomes Short Term: Increase workloads from initial exercise prescription for resistance, speed, and METs.;Short Term: Perform resistance training exercises routinely during rehab and add in resistance training at home;Long Term: Improve cardiorespiratory fitness, muscular endurance and strength as measured by increased METs and functional capacity ( )       Able to understand and use rate of perceived exertion (RPE) scale Yes       Intervention Provide education and explanation on how to use RPE scale       Expected Outcomes Short Term: Able to use RPE daily in rehab to express subjective intensity level;Long Term:  Able to use RPE to guide intensity level when exercising independently       Able to understand and use Dyspnea scale Yes       Intervention Provide education and explanation on how to use Dyspnea scale       Expected Outcomes Short Term: Able to use Dyspnea scale daily  in rehab to express subjective sense of shortness of breath during exertion;Long Term: Able to use Dyspnea scale to guide intensity level when exercising independently       Knowledge and understanding of Target Heart Rate Range (THRR) Yes       Intervention Provide education and explanation of THRR including how the numbers were predicted and where they are located for reference       Expected Outcomes Short Term: Able to state/look up THRR;Short Term: Able to use daily as guideline for intensity in rehab;Long Term: Able to use THRR to govern intensity when exercising independently       Able to check pulse independently Yes       Intervention Provide education and demonstration on how to check pulse in carotid and radial arteries.;Review the importance of being able to check your own pulse for safety during independent exercise       Expected Outcomes  Short Term: Able to explain why pulse checking is important during independent exercise;Long Term: Able to check pulse independently and accurately       Understanding of Exercise Prescription Yes       Intervention Provide education, explanation, and written materials on patient's individual exercise prescription       Expected Outcomes Short Term: Able to explain program exercise prescription;Long Term: Able to explain home exercise prescription to exercise independently                Exercise Goals Re-Evaluation :  Exercise Goals Re-Evaluation     Row Name 10/05/22 1402 10/13/22 1451 10/29/22 1340 10/30/22 1116 11/10/22 1511     Exercise Goal Re-Evaluation   Exercise Goals Review Increase Physical Activity;Able to understand and use rate of perceived exertion (RPE) scale;Knowledge and understanding of Target Heart Rate Range (THRR);Understanding of Exercise Prescription;Increase Strength and Stamina;Able to check pulse independently Increase Physical Activity;Increase Strength and Stamina;Understanding of Exercise Prescription Increase Physical Activity;Increase Strength and Stamina;Understanding of Exercise Prescription Increase Physical Activity;Increase Strength and Stamina;Able to understand and use rate of perceived exertion (RPE) scale;Able to understand and use Dyspnea scale;Knowledge and understanding of Target Heart Rate Range (THRR);Able to check pulse independently;Understanding of Exercise Prescription Increase Physical Activity;Increase Strength and Stamina;Understanding of Exercise Prescription   Comments Reviewed RPE scale, THR and program prescription with pt today.  Pt voiced understanding and was given a copy of goals to take home. Nicola Girt is off to a good start with the program. He is already working at pretty significant high loads, around level 11 on the XR and up to 3.8 mph speed on the treadmill, all with appropriate RPEs. We will continue to monitor his progress as he  furthers in the program. Cletis Athens is doing well in rehab. He recently increased his overall average MET level to 5.92 METs. He also increased his treadmill workload to 4 mph with an incline of 5%, and he walked at this pace for 30 minutes. He improved to level 12 on the XR and level 5 on the T5 nustep as well. We will continue to monitor his progress in the program. Reviewed home exercise with pt today.  Pt plans to add in some walking in the garden for exercise. Reviewed THR, pulse, RPE, sign and symptoms, pulse oximetery and when to call 911 or MD.  Also discussed weather considerations and indoor options.  Pt voiced understanding. We talked about the importance of adding in home exerise. Cable has been doing well in rehab. He has increased to a 3.7/5.5% treadmill  workload which he completes for 30 minutes at a time.  He increased to level 13 on the XR, working over 9 METS!  His oxygen is staying well above 88%. We will continue to monitor.   Expected Outcomes Short: Use RPE daily to regulate intensity.  Long: Follow program prescription in THR. Short: Follow current exercise prescription, progress workloads slowly and gradually Long: Increase overall strength and stamina Short: Continue to progressively increase workloads. Long: Continue to improve strength and stamina. Short: Add in some exercise at home Long: Conitnue to improve stamina Short: Add in intervals to regimen Long: Continue to increase overall MET level and stamina    Row Name 11/23/22 0959 11/24/22 0808 12/08/22 1433 12/23/22 1424       Exercise Goal Re-Evaluation   Exercise Goals Review Increase Physical Activity;Increase Strength and Stamina;Understanding of Exercise Prescription Increase Physical Activity;Increase Strength and Stamina;Understanding of Exercise Prescription Increase Physical Activity;Increase Strength and Stamina;Understanding of Exercise Prescription Increase Physical Activity;Increase Strength and Stamina;Understanding of  Exercise Prescription    Comments Cletis Athens returned to rehab today.  He has been staying active by working in the garden while he was out.  He is happy to be back and ready to get back to exercise again. Cletis Athens has only attended rehab once since the last review. During that one session he walked on the treadmill for 30 minutes at a speed of 3.4 mph with an incline of 2.5%. He also has continued to use 6 lb hand weights for resistance training. We will continue to monitor his progress in the program. Cletis Athens continues to do well in rehab. He prefers to walk on the treadmill and continues to use that as his primary mode of exercise. Staff will continue to encourage patient to use a variety of machines. He has increased his speed on the treadmill, varying between 3.4-3.8 mph with a range of 2.5-6% incline. It does not appear that he is completing intervals. His RPEs remain in appropriate range. Will continue to monitor. Cletis Athens continues to do well in rehab. He has consistently walked the treadmill for 30 minutes at a speed of 3.4 mph with incline ranging from 6 to 7%. He also continues to work on the T5 nustep at level 4 and use 6 lb hand weights for resistance training as well. We will continue to monitor his progress in the program.    Expected Outcomes Short: Continue to return to routine exercise Long: conitnue to improve stamina Short: Attend rehab regularly. Long: Continue to improve strength and stamina. Short: Keep consistent workloads on treadmill, try different seated machines Long: Continue to increase overall MET level and stamina Short: Keep consistent workloads on treadmill, try different seated machines. Long: Continue to increase overall MET level and stamina.             Discharge Exercise Prescription (Final Exercise Prescription Changes):  Exercise Prescription Changes - 12/23/22 1400       Response to Exercise   Blood Pressure (Admit) 100/60    Blood Pressure (Exit) 124/60    Heart Rate (Admit) 51  bpm    Heart Rate (Exercise) 93 bpm    Heart Rate (Exit) 59 bpm    Oxygen Saturation (Admit) 97 %    Oxygen Saturation (Exercise) 95 %    Oxygen Saturation (Exit) 97 %    Rating of Perceived Exertion (Exercise) 14    Perceived Dyspnea (Exercise) 0    Symptoms none    Duration Continue with 30 min of aerobic exercise  without signs/symptoms of physical distress.    Intensity THRR unchanged      Progression   Progression Continue to progress workloads to maintain intensity without signs/symptoms of physical distress.    Average METs 5.85      Resistance Training   Training Prescription Yes    Weight 6 lb    Reps 10-15      Interval Training   Interval Training No      Treadmill   MPH 3.4    Grade 7    Minutes 30    METs 6.88      T5 Nustep   Level 4    Minutes 30    METs 2.2      Home Exercise Plan   Plans to continue exercise at Home (comment)   walking, working in garden   Frequency Add 2 additional days to program exercise sessions.    Initial Home Exercises Provided 10/30/22      Oxygen   Maintain Oxygen Saturation 88% or higher             Nutrition:  Target Goals: Understanding of nutrition guidelines, daily intake of sodium 1500mg , cholesterol 200mg , calories 30% from fat and 7% or less from saturated fats, daily to have 5 or more servings of fruits and vegetables.  Education: All About Nutrition: -Group instruction provided by verbal, written material, interactive activities, discussions, models, and posters to present general guidelines for heart healthy nutrition including fat, fiber, MyPlate, the role of sodium in heart healthy nutrition, utilization of the nutrition label, and utilization of this knowledge for meal planning. Follow up email sent as well. Written material given at graduation.   Biometrics:  Pre Biometrics - 09/30/22 1542       Pre Biometrics   Height 6' 2.25" (1.886 m)    Weight 176 lb 14.4 oz (80.2 kg)    Waist Circumference  34 inches    Hip Circumference 40 inches    Waist to Hip Ratio 0.85 %    BMI (Calculated) 22.56    Single Leg Stand 30 seconds              Nutrition Therapy Plan and Nutrition Goals:  Nutrition Therapy & Goals - 09/30/22 1520       Intervention Plan   Intervention Prescribe, educate and counsel regarding individualized specific dietary modifications aiming towards targeted core components such as weight, hypertension, lipid management, diabetes, heart failure and other comorbidities.    Expected Outcomes Short Term Goal: Understand basic principles of dietary content, such as calories, fat, sodium, cholesterol and nutrients.;Short Term Goal: A plan has been developed with personal nutrition goals set during dietitian appointment.;Long Term Goal: Adherence to prescribed nutrition plan.             Nutrition Assessments:  MEDIFICTS Score Key: ?70 Need to make dietary changes  40-70 Heart Healthy Diet ? 40 Therapeutic Level Cholesterol Diet  Flowsheet Row Pulmonary Rehab from 09/30/2022 in [facility]  Picture Your Plate Total Score on Admission 53      Picture Your Plate Scores: <16 Unhealthy dietary pattern with much room for improvement. 41-50 Dietary pattern unlikely to meet recommendations for good health and room for improvement. 51-60 More healthful dietary pattern, with some room for improvement.  >60 Healthy dietary pattern, although there may be some specific behaviors that could be improved.   Nutrition Goals Re-Evaluation:  Nutrition Goals Re-Evaluation     Row Name 10/23/22 1120 11/23/22 1003  12/18/22 1125         Goals   Current Weight 181 lb (82.1 kg) -- 180 lb (81.6 kg)     Nutrition Goal Eat smaller portions. Short: Choose and plan snacks accordingly to patients caloric intake to improve breathing. Long: Maintain a diet independently that meets their caloric intake to aid in daily shortness of breath. --     Comment Patient was  informed on why it is important to maintain a balanced diet when dealing with Respiratory issues. Explained that it takes a lot of energy to breath and when they are short of breath often they will need to have a good diet to help keep up with the calories they are expending for breathing. Jon conintues to work on his diet.  He feels that he is doing pretty good with his diet.  He continues to make changes and trying to find healthier options.  We talked about staying away from salt with heart history as well. Patient would not like to meet with the dietician. Will follow up next review.     Expected Outcome Short: Choose and plan snacks accordingly to patients caloric intake to improve breathing. Long: Maintain a diet independently that meets their caloric intake to aid in daily shortness of breath. Short; conitnue to make healthier choices Long: Conitnue to improve balance --              Nutrition Goals Discharge (Final Nutrition Goals Re-Evaluation):  Nutrition Goals Re-Evaluation - 12/18/22 1125       Goals   Current Weight 180 lb (81.6 kg)    Comment Patient would not like to meet with the dietician. Will follow up next review.             Psychosocial: Target Goals: Acknowledge presence or absence of significant depression and/or stress, maximize coping skills, provide positive support system. Participant is able to verbalize types and ability to use techniques and skills needed for reducing stress and depression.   Education: Stress, Anxiety, and Depression - Group verbal and visual presentation to define topics covered.  Reviews how body is impacted by stress, anxiety, and depression.  Also discusses healthy ways to reduce stress and to treat/manage anxiety and depression.  Written material given at graduation.   Education: Sleep Hygiene -Provides group verbal and written instruction about how sleep can affect your health.  Define sleep hygiene, discuss sleep cycles and impact  of sleep habits. Review good sleep hygiene tips.    Initial Review & Psychosocial Screening:  Initial Psych Review & Screening - 09/28/22 1120       Initial Review   Current issues with Current Stress Concerns    Comments He can look to his Wife, kids and grandkids for support. He is having more shortness of breath than usual and wants to get that under control. He has done with Cardiac Rehab program in 2020.      Family Dynamics   Good Support System? Yes      Barriers   Psychosocial barriers to participate in program The patient should benefit from training in stress management and relaxation.      Screening Interventions   Interventions Encouraged to exercise;To provide support and resources with identified psychosocial needs;Program counselor consult;Provide feedback about the scores to participant    Expected Outcomes Short Term goal: Utilizing psychosocial counselor, staff and physician to assist with identification of specific Stressors or current issues interfering with healing process. Setting desired goal for each stressor  or current issue identified.;Long Term Goal: Stressors or current issues are controlled or eliminated.;Short Term goal: Identification and review with participant of any Quality of Life or Depression concerns found by scoring the questionnaire.;Long Term goal: The participant improves quality of Life and PHQ9 Scores as seen by post scores and/or verbalization of changes             Quality of Life Scores:  Scores of 19 and below usually indicate a poorer quality of life in these areas.  A difference of  2-3 points is a clinically meaningful difference.  A difference of 2-3 points in the total score of the Quality of Life Index has been associated with significant improvement in overall quality of life, self-image, physical symptoms, and general health in studies assessing change in quality of life.  PHQ-9: Review Flowsheet       10/23/2022 09/30/2022  10/23/2019 05/01/2019  Depression screen PHQ 2/9  Decreased Interest 0 0 0 0  Down, Depressed, Hopeless 0 0 0 0  PHQ - 2 Score 0 0 0 0  Altered sleeping 0 0 0 0  Tired, decreased energy 3 3 1 1   Change in appetite 0 2 0 1  Feeling bad or failure about yourself  0 0 0 0  Trouble concentrating 0 0 0 0  Moving slowly or fidgety/restless 0 0 0 0  Suicidal thoughts 0 0 0 0  PHQ-9 Score 3 5 1 2   Difficult doing work/chores Somewhat difficult Very difficult Somewhat difficult Somewhat difficult   Interpretation of Total Score  Total Score Depression Severity:  1-4 = Minimal depression, 5-9 = Mild depression, 10-14 = Moderate depression, 15-19 = Moderately severe depression, 20-27 = Severe depression   Psychosocial Evaluation and Intervention:  Psychosocial Evaluation - 09/28/22 1121       Psychosocial Evaluation & Interventions   Interventions Encouraged to exercise with the program and follow exercise prescription;Relaxation education;Stress management education    Comments He can look to his Wife, kids and grandkids for support. He is having more shortness of breath than usual and wants to get that under control. He has done with Cardiac Rehab program in 2020.    Expected Outcomes Short: Start [name] to help with mood. Long: Maintain a healthy mental state.    Continue Psychosocial Services  Follow up required by staff             Psychosocial Re-Evaluation:  Psychosocial Re-Evaluation     Row Name 10/23/22 1117 11/23/22 1001 12/18/22 1127         Psychosocial Re-Evaluation   Current issues with Current Stress Concerns Current Stress Concerns None Identified     Comments Reviewed patient health questionnaire (PHQ-9) with patient for follow up. Previously, patients score indicated signs/symptoms of depression.  Reviewed to see if patient is improving symptom wise while in program.  Score improved and patient states that it is because he has been able to increase his exercise  level. Cletis Athens has been out with a heart workup after a nuclear stress test, but echo found everything to be normal, so he is pleased with that. He is feeling pretty good otherwise and just meet things where they are and tackles the daily tasks.  He tries to not get too stressed.  He is sleeping well for most part. Patient reports no issues with their current mental states, sleep, stress, depression or anxiety. Will follow up with patient in a few weeks for any changes.     Expected Outcomes  Short: Continue to attend [name] regularly for regular exercise and social engagement. Long: Continue to improve symptoms and manage a positive mental state. Short; Return to routine exercise for mental boost Long: Continue to stay positive Short: Continue to exercise regularly to support mental health and notify staff of any changes. Long: maintain mental health and well being through teaching of rehab or prescribed medications independently.     Interventions Encouraged to attend Pulmonary Rehabilitation for the exercise Encouraged to attend Pulmonary Rehabilitation for the exercise Encouraged to attend Pulmonary Rehabilitation for the exercise     Continue Psychosocial Services  Follow up required by staff -- Follow up required by staff              Psychosocial Discharge (Final Psychosocial Re-Evaluation):  Psychosocial Re-Evaluation - 12/18/22 1127       Psychosocial Re-Evaluation   Current issues with None Identified    Comments Patient reports no issues with their current mental states, sleep, stress, depression or anxiety. Will follow up with patient in a few weeks for any changes.    Expected Outcomes Short: Continue to exercise regularly to support mental health and notify staff of any changes. Long: maintain mental health and well being through teaching of rehab or prescribed medications independently.    Interventions Encouraged to attend Pulmonary Rehabilitation for the exercise    Continue  Psychosocial Services  Follow up required by staff             Education: Education Goals: Education classes will be provided on a weekly basis, covering required topics. Participant will state understanding/return demonstration of topics presented.  Learning Barriers/Preferences:  Learning Barriers/Preferences - 09/28/22 1116       Learning Barriers/Preferences   Learning Barriers None    Learning Preferences Individual Instruction             General Pulmonary Education Topics:  Infection Prevention: - Provides verbal and written material to individual with discussion of infection control including proper hand washing and proper equipment cleaning during exercise session. Flowsheet Row Pulmonary Rehab from 09/30/2022 in [facility] Card[facility] Pulmonary Rehab  Date 09/28/22  Educator [facility]  Instruction Review Code 1- Verbalizes Understanding       Falls Prevention: - Provides verbal and written material to individual with discussion of falls prevention and safety. Flowsheet Row Pulmonary Rehab from 09/30/2022 in [facility] Card[facility] Pulmonary Rehab  Date 09/28/22  Educator [facility]  Instruction Review Code 1- Verbalizes Understanding       Chronic Lung Disease Review: - Group verbal instruction with posters, models, PowerPoint presentations and videos,  to review new updates, new respiratory medications, new advancements in procedures and treatments. Providing information on websites and "800" numbers for continued self-education. Includes information about supplement oxygen, available portable oxygen systems, continuous and intermittent flow rates, oxygen safety, concentrators, and Medicare reimbursement for oxygen. Explanation of Pulmonary Drugs, including class, frequency, complications, importance of spacers, rinsing mouth after steroid MDI's, and proper cleaning methods for nebulizers. Review of basic lung anatomy and physiology related to function, structure, and complications of  lung disease. Review of risk factors. Discussion about methods for diagnosing sleep apnea and types of masks and machines for OSA. Includes a review of the use of types of environmental controls: home humidity, furnaces, filters, dust mite/pet prevention, HEPA vacuums. Discussion about weather changes, air quality and the benefits of nasal washing. Instruction on Warning signs, infection symptoms, calling MD promptly, preventive modes, and value of vaccinations. Review of effective airway clearance,  coughing and/or vibration techniques. Emphasizing that all should Create an Action Plan. Written material given at graduation.   AED/CPR: - Group verbal and written instruction with the use of models to demonstrate the basic use of the AED with the basic ABC's of resuscitation.    Anatomy and Cardiac Procedures: - Group verbal and visual presentation and models provide information about basic cardiac anatomy and function. Reviews the testing methods done to diagnose heart disease and the outcomes of the test results. Describes the treatment choices: Medical Management, Angioplasty, or Coronary Bypass Surgery for treating various heart conditions including Myocardial Infarction, Angina, Valve Disease, and Cardiac Arrhythmias.  Written material given at graduation.   Medication Safety: - Group verbal and visual instruction to review commonly prescribed medications for heart and lung disease. Reviews the medication, class of the drug, and side effects. Includes the steps to properly store meds and maintain the prescription regimen.  Written material given at graduation.   Other: -Provides group and verbal instruction on various topics (see comments)   Knowledge Questionnaire Score:  Knowledge Questionnaire Score - 09/30/22 1520       Knowledge Questionnaire Score   Pre Score 15/18              Core Components/Risk Factors/Patient Goals at Admission:  Personal Goals and Risk Factors at  Admission - 09/28/22 1118       Core Components/Risk Factors/Patient Goals on Admission    Weight Management Yes;Weight Maintenance;Weight Gain    Intervention Weight Management: Develop a combined nutrition and exercise program designed to reach desired caloric intake, while maintaining appropriate intake of nutrient and fiber, sodium and fats, and appropriate energy expenditure required for the weight goal.;Weight Management: Provide education and appropriate resources to help participant work on and attain dietary goals.;Weight Management/Obesity: Establish reasonable short term and long term weight goals.    Expected Outcomes Short Term: Continue to assess and modify interventions until short term weight is achieved;Long Term: Adherence to nutrition and physical activity/exercise program aimed toward attainment of established weight goal;Weight Maintenance: Understanding of the daily nutrition guidelines, which includes 25-35% calories from fat, 7% or less cal from saturated fats, less than 200mg  cholesterol, less than 1.5gm of sodium, & 5 or more servings of fruits and vegetables daily;Understanding recommendations for meals to include 15-35% energy as protein, 25-35% energy from fat, 35-60% energy from carbohydrates, less than 200mg  of dietary cholesterol, 20-35 gm of total fiber daily;Understanding of distribution of calorie intake throughout the day with the consumption of 4-5 meals/snacks;Weight Gain: Understanding of general recommendations for a high calorie, high protein meal plan that promotes weight gain by distributing calorie intake throughout the day with the consumption for 4-5 meals, snacks, and/or supplements    Tobacco Cessation Yes    Intervention Assist the participant in steps to quit. Provide individualized education and counseling about committing to Tobacco Cessation, relapse prevention, and pharmacological support that can be provided by physician.;Education officer, environmental,  assist with locating and accessing local/national Quit Smoking programs, and support quit date choice.    Expected Outcomes Short Term: Will demonstrate readiness to quit, by selecting a quit date.;Short Term: Will quit all tobacco product use, adhering to prevention of relapse plan.;Long Term: Complete abstinence from all tobacco products for at least 12 months from quit date.    Improve shortness of breath with ADL's Yes    Intervention Provide education, individualized exercise plan and daily activity instruction to help decrease symptoms of SOB with activities of daily living.  Expected Outcomes Short Term: Improve cardiorespiratory fitness to achieve a reduction of symptoms when performing ADLs;Long Term: Be able to perform more ADLs without symptoms or delay the onset of symptoms    Hypertension Yes    Intervention Provide education on lifestyle modifcations including regular physical activity/exercise, weight management, moderate sodium restriction and increased consumption of fresh fruit, vegetables, and low fat dairy, alcohol moderation, and smoking cessation.;Monitor prescription use compliance.    Expected Outcomes Short Term: Continued assessment and intervention until BP is < 140/4mm HG in hypertensive participants. < 130/71mm HG in hypertensive participants with diabetes, heart failure or chronic kidney disease.;Long Term: Maintenance of blood pressure at goal levels.    Lipids Yes    Intervention Provide education and support for participant on nutrition & aerobic/resistive exercise along with prescribed medications to achieve LDL 70mg , HDL >40mg .    Expected Outcomes Short Term: Participant states understanding of desired cholesterol values and is compliant with medications prescribed. Participant is following exercise prescription and nutrition guidelines.;Long Term: Cholesterol controlled with medications as prescribed, with individualized exercise RX and with personalized nutrition  plan. Value goals: LDL < 70mg , HDL > 40 mg.             Education:Diabetes - Individual verbal and written instruction to review signs/symptoms of diabetes, desired ranges of glucose level fasting, after meals and with exercise. Acknowledge that pre and post exercise glucose checks will be done for 3 sessions at entry of program.   Know Your Numbers and Heart Failure: - Group verbal and visual instruction to discuss disease risk factors for cardiac and pulmonary disease and treatment options.  Reviews associated critical values for Overweight/Obesity, Hypertension, Cholesterol, and Diabetes.  Discusses basics of heart failure: signs/symptoms and treatments.  Introduces Heart Failure Zone chart for action plan for heart failure.  Written material given at graduation. Flowsheet Row Pulmonary Rehab from 09/30/2022 in [facility] Cardiac and Pulmonary Rehab  Education need identified 09/30/22       Core Components/Risk Factors/Patient Goals Review:   Goals and Risk Factor Review     Row Name 10/23/22 1115 11/23/22 1004 12/18/22 1128         Core Components/Risk Factors/Patient Goals Review   Personal Goals Review Improve shortness of breath with ADL's Improve shortness of breath with ADL's;Tobacco Cessation;Weight Management/Obesity;Hypertension Tobacco Cessation     Review Spoke to patient about their shortness of breath and what they can do to improve. Patient has been informed of breathing techniques when starting the program. Patient is informed to tell staff if they have had any med changes and that certain meds they are taking or not taking can be causing shortness of breath. Cletis Athens is doing well in rehab. His weight is staying steady for most part.  His breathing is doing well and he feels he is doing well.  He is still smoking a pack each day with no plans to quit.  His pressures are doing well.  He is feeling pretty good overall.  He had some acupuncture to help with plantar facsitis and has  more testing coming up next week. Cletis Athens has been cutting back on smoking. He is cuting back and telling himself to wait a little longer until the next one. He is trying to quit but putting off smoking and fighting his urges. He is not interested in taking medications to help him quit.     Expected Outcomes Short: Attend [name] regularly to improve shortness of breath with ADL's. Long: maintain independence with ADL's  Short: Keep [name] updated with testing.  Long: Conitnue to work on breathing Short: reduce tobacco use. Long: Be tobacco free.              Core Components/Risk Factors/Patient Goals at Discharge (Final Review):   Goals and Risk Factor Review - 12/18/22 1128       Core Components/Risk Factors/Patient Goals Review   Personal Goals Review Tobacco Cessation    Review Cletis Athens has been cutting back on smoking. He is cuting back and telling himself to wait a little longer until the next one. He is trying to quit but putting off smoking and fighting his urges. He is not interested in taking medications to help him quit.    Expected Outcomes Short: reduce tobacco use. Long: Be tobacco free.             ITP Comments:  ITP Comments     Row Name 09/28/22 1115 09/30/22 1516 10/05/22 1401 10/07/22 0825 11/04/22 0928   ITP Comments Virtual Visit completed. Patient informed on EP and RD appointment and 6 Minute walk test. Patient also informed of patient health questionnaires on My Chart. Patient Verbalizes understanding. Visit diagnosis can be found in [facility] media,patient is [place]. Completed and gym orientation. Initial ITP created and sent for review to Dr. Vida Rigger, Medical Director. First full day of exercise!  Patient was oriented to gym and equipment including functions, settings, policies, and procedures.  Patient's individual exercise prescription and treatment plan were reviewed.  All starting workloads were established based on the results of the 6 minute walk test done at initial  orientation visit.  The plan for exercise progression was also introduced and progression will be customized based on patient's performance and goals. 30 Day review completed. Medical Director ITP review done, changes made as directed, and signed approval by Medical Director.     new to program 30 day review completed. ITP sent to Dr. Jinny Sanders, Medical Director of  Pulmonary Rehab. Continue with ITP unless changes are made by physician.    Row Name 11/23/22 1610 12/02/22 0839 12/30/22 0930       ITP Comments Jon returned to rehab today after heart workup. 30 Day review completed. Medical Director ITP review done, changes made as directed, and signed approval by Medical Director. 30 Day review completed. Medical Director ITP review done, changes made as directed, and signed approval by Medical Director.              Comments:

## 2023-01-01 ENCOUNTER — Encounter: Payer: No Typology Code available for payment source | Admitting: *Deleted

## 2023-01-01 DIAGNOSIS — R0609 Other forms of dyspnea: Secondary | ICD-10-CM | POA: Diagnosis not present

## 2023-01-01 NOTE — Progress Notes (Signed)
Daily Session Note  Patient Details  Name: Brian Mcclure MRN: [id number] Date of Birth: March 19, 1956 Referring Provider:   Flowsheet Row Pulmonary Rehab from 09/30/2022 in [facility] Cardiac and Pulmonary Rehab  Referring Provider Vito Berger MD       Encounter Date: 01/01/2023  Check In:  Session Check In - 01/01/23 1147       Check-In   Supervising physician immediately available to respond to emergencies See telemetry face sheet for immediately available ER MD    Location [facility]-Cardiac & Pulmonary Rehab    Staff Present Cora Collum, RN, BSN, CCRP;Joseph Hood, RCP,RRT,BSRT;Noah Tickle, BS, Exercise Physiologist    Virtual Visit No    Medication changes reported     Yes    Comments stopped Crestor   started Zetia generic.    Fall or balance concerns reported    No    Warm-up and Cool-down Performed on first and last piece of equipment    Resistance Training Performed Yes    VAD Patient? No    PAD/SET Patient? No      Pain Assessment   Currently in Pain? No/denies                Social History   Tobacco Use  Smoking Status Every Day   Packs/day: 1.00   Years: 40.00   Additional pack years: 0.00   Total pack years: 40.00   Types: Cigarettes  Smokeless Tobacco Never    Goals Met:  Proper associated with RPD/PD & O2 Sat Independence with exercise equipment Exercise tolerated well No report of concerns or symptoms today  Goals Unmet:  Not Applicable  Comments: Pt able to follow exercise prescription today without complaint.  Will continue to monitor for progression.    Dr. Bethann Punches is Medical Director for [facility] Cardiac Rehabilitation.  Dr. Vida Rigger is Medical Director for [facility] Pulmonary Rehabilitation.

## 2023-01-07 ENCOUNTER — Encounter: Payer: No Typology Code available for payment source | Admitting: *Deleted

## 2023-01-07 DIAGNOSIS — R0609 Other forms of dyspnea: Secondary | ICD-10-CM

## 2023-01-07 NOTE — Progress Notes (Signed)
Daily Session Note  Patient Details  Name: Brian Mcclure MRN: [id number] Date of Birth: 1955/08/16 Referring Provider:   Doristine Devoid Pulmonary Rehab from 09/30/2022 in [facility] Cardiac and Pulmonary Rehab  Referring Provider Vito Berger MD       Encounter Date: 01/07/2023  Check In:  Session Check In - 01/07/23 1120       Check-In   Supervising physician immediately available to respond to emergencies See telemetry face sheet for immediately available ER MD    Location [facility]-Cardiac & Pulmonary Rehab    Staff Present Susann Givens, RN Atilano Median, RN, ADN;Roger Shelter, RN, BSN, CCRP   [place] Bigelow, MS   Virtual Visit No    Medication changes reported     No    Fall or balance concerns reported    No    Tobacco Cessation No Change    Warm-up and Cool-down Performed on first and last piece of equipment    Resistance Training Performed Yes    VAD Patient? No    PAD/SET Patient? No      Pain Assessment   Currently in Pain? No/denies                Social History   Tobacco Use  Smoking Status Every Day   Packs/day: 1.00   Years: 40.00   Additional pack years: 0.00   Total pack years: 40.00   Types: Cigarettes  Smokeless Tobacco Never    Goals Met:  Independence with exercise equipment Exercise tolerated well No report of concerns or symptoms today Strength training completed today  Goals Unmet:  Not Applicable  Comments: Pt able to follow exercise prescription today without complaint.  Will continue to monitor for progression.    Dr. Bethann Punches is Medical Director for [facility] Cardiac Rehabilitation.  Dr. Vida Rigger is Medical Director for [facility] Pulmonary Rehabilitation.

## 2023-01-11 ENCOUNTER — Encounter: Payer: No Typology Code available for payment source | Admitting: *Deleted

## 2023-01-11 DIAGNOSIS — R0609 Other forms of dyspnea: Secondary | ICD-10-CM | POA: Diagnosis not present

## 2023-01-11 NOTE — Progress Notes (Signed)
Daily Session Note  Patient Details  Name: Brian Mcclure MRN: [id number] Date of Birth: 20-Jan-1956 Referring Provider:   Flowsheet Row Pulmonary Rehab from 09/30/2022 in [facility] Cardiac and Pulmonary Rehab  Referring Provider Vito Berger MD       Encounter Date: 01/11/2023  Check In:  Session Check In - 01/11/23 1124       Check-In   Supervising physician immediately available to respond to emergencies See telemetry face sheet for immediately available ER MD    Location [facility]-Cardiac & Pulmonary Rehab    Staff Present Lanny Hurst, RN, Franki Monte, BS, ACSM CEP, Exercise Physiologist;Kristen Coble, RN,BC,MSN;Meredith Jewel Baize, RN BSN    Virtual Visit No    Medication changes reported     No    Fall or balance concerns reported    No    Tobacco Cessation No Change    Current number of cigarettes/nicotine per day     12    Warm-up and Cool-down Performed on first and last piece of equipment    Resistance Training Performed Yes    VAD Patient? No    PAD/SET Patient? No      Pain Assessment   Currently in Pain? No/denies                Social History   Tobacco Use  Smoking Status Every Day   Packs/day: 1.00   Years: 40.00   Additional pack years: 0.00   Total pack years: 40.00   Types: Cigarettes  Smokeless Tobacco Never    Goals Met:  Independence with exercise equipment Exercise tolerated well No report of concerns or symptoms today Strength training completed today  Goals Unmet:  Not Applicable  Comments: Pt able to follow exercise prescription today without complaint.  Will continue to monitor for progression.    Dr. Bethann Punches is Medical Director for [facility] Cardiac Rehabilitation.  Dr. Vida Rigger is Medical Director for [facility] Pulmonary Rehabilitation.

## 2023-01-14 ENCOUNTER — Encounter: Payer: No Typology Code available for payment source | Admitting: *Deleted

## 2023-01-14 DIAGNOSIS — R0609 Other forms of dyspnea: Secondary | ICD-10-CM | POA: Diagnosis not present

## 2023-01-14 NOTE — Progress Notes (Signed)
Daily Session Note  Patient Details  Name: Brian Mcclure MRN: [id number] Date of Birth: [birth year] Referring Provider:   Flowsheet Row Pulmonary Rehab from 09/30/2022 in [facility] Cardiac and Pulmonary Rehab  Referring Provider Vito Berger MD       Encounter Date: 01/14/2023  Check In:  Session Check In - 01/14/23 1403       Check-In   Supervising physician immediately available to respond to emergencies See telemetry face sheet for immediately available ER MD    Location [facility]-Cardiac & Pulmonary Rehab    Staff Present Lanny Hurst, RN, ADN;Joseph Hood, RCP,RRT,BSRT;Cora Collum, RN, BSN, CCRP    Virtual Visit No    Medication changes reported     No    Fall or balance concerns reported    No    Tobacco Cessation No Change    Warm-up and Cool-down Performed on first and last piece of equipment    Resistance Training Performed Yes    VAD Patient? No    PAD/SET Patient? No      Pain Assessment   Currently in Pain? No/denies                Social History   Tobacco Use  Smoking Status Every Day   Packs/day: 1.00   Years: 40.00   Additional pack years: 0.00   Total pack years: 40.00   Types: Cigarettes  Smokeless Tobacco Never    Goals Met:  Independence with exercise equipment Exercise tolerated well No report of concerns or symptoms today Strength training completed today  Goals Unmet:  Not Applicable  Comments: Pt able to follow exercise prescription today without complaint.  Will continue to monitor for progression.    Dr. Bethann Punches is Medical Director for [facility] Cardiac Rehabilitation.  Dr. Vida Rigger is Medical Director for [facility] Pulmonary Rehabilitation.

## 2023-01-18 ENCOUNTER — Encounter: Payer: No Typology Code available for payment source | Admitting: *Deleted

## 2023-01-22 ENCOUNTER — Encounter: Payer: No Typology Code available for payment source | Attending: Registered Nurse | Admitting: *Deleted

## 2023-01-22 DIAGNOSIS — R0609 Other forms of dyspnea: Secondary | ICD-10-CM | POA: Insufficient documentation

## 2023-01-22 NOTE — Progress Notes (Signed)
Daily Session Note  Patient Details  Name: Brian Mcclure MRN: [id number] Date of Birth: [birth year] Referring Provider:   Flowsheet Row Pulmonary Rehab from 09/30/2022 in [facility] Cardiac and Pulmonary Rehab  Referring Provider Vito Berger MD       Encounter Date: 01/22/2023  Check In:  Session Check In - 01/22/23 1150       Check-In   Supervising physician immediately available to respond to emergencies See telemetry face sheet for immediately available ER MD    Location [facility]-Cardiac & Pulmonary Rehab    Staff Present Cora Collum, RN, BSN, CCRP;Noah Tickle, BS, Exercise Physiologist;Other   [place] Bigelow HFS   Virtual Visit No    Medication changes reported     No    Fall or balance concerns reported    No    Warm-up and Cool-down Performed on first and last piece of equipment    Resistance Training Performed Yes    VAD Patient? No    PAD/SET Patient? No      Pain Assessment   Currently in Pain? No/denies                Social History   Tobacco Use  Smoking Status Every Day   Packs/day: 1.00   Years: 40.00   Additional pack years: 0.00   Total pack years: 40.00   Types: Cigarettes  Smokeless Tobacco Never    Goals Met:  Proper associated with RPD/PD & O2 Sat Independence with exercise equipment Exercise tolerated well No report of concerns or symptoms today  Goals Unmet:  Not Applicable  Comments: Pt able to follow exercise prescription today without complaint.  Will continue to monitor for progression.    Dr. Bethann Punches is Medical Director for [facility] Cardiac Rehabilitation.  Dr. Vida Rigger is Medical Director for [facility] Pulmonary Rehabilitation.

## 2023-01-25 ENCOUNTER — Encounter: Payer: No Typology Code available for payment source | Admitting: *Deleted

## 2023-01-25 DIAGNOSIS — R0609 Other forms of dyspnea: Secondary | ICD-10-CM | POA: Diagnosis not present

## 2023-01-25 NOTE — Progress Notes (Signed)
Daily Session Note  Patient Details  Name: SHAYDON ROESCH MRN: [id number] Date of Birth: 1956-02-02 Referring Provider:   Flowsheet Row Pulmonary Rehab from 09/30/2022 in [facility] Cardiac and Pulmonary Rehab  Referring Provider Vito Berger MD       Encounter Date: 01/25/2023  Check In:  Session Check In - 01/25/23 1152       Check-In   Supervising physician immediately available to respond to emergencies See telemetry face sheet for immediately available ER MD    Location [facility]-Cardiac & Pulmonary Rehab    Staff Present Cora Collum, RN, BSN, Matthew Saras, BS, ACSM CEP, Exercise Physiologist;Meredith Jewel Baize, RN BSN;Krista Karleen Hampshire RN, BSN    Virtual Visit No    Medication changes reported     No    Fall or balance concerns reported    No    Tobacco Cessation Use Increase    Current number of cigarettes/nicotine per day     14    Warm-up and Cool-down Performed on first and last piece of equipment    Resistance Training Performed Yes    VAD Patient? No    PAD/SET Patient? No      Pain Assessment   Currently in Pain? No/denies                Social History   Tobacco Use  Smoking Status Every Day   Packs/day: 1.00   Years: 40.00   Additional pack years: 0.00   Total pack years: 40.00   Types: Cigarettes  Smokeless Tobacco Never    Goals Met:  Proper associated with RPD/PD & O2 Sat Independence with exercise equipment Exercise tolerated well No report of concerns or symptoms today  Goals Unmet:  Not Applicable  Comments: Pt able to follow exercise prescription today without complaint.  Will continue to monitor for progression.    Dr. Bethann Punches is Medical Director for [facility] Cardiac Rehabilitation.  Dr. Vida Rigger is Medical Director for [facility] Pulmonary Rehabilitation.

## 2023-01-26 ENCOUNTER — Encounter: Payer: Self-pay | Admitting: *Deleted

## 2023-01-26 DIAGNOSIS — R0609 Other forms of dyspnea: Secondary | ICD-10-CM

## 2023-01-26 NOTE — Progress Notes (Signed)
Pulmonary Individual Treatment Plan  Patient Details  Name: Brian Mcclure MRN: [id number] Date of Birth: 1955/08/17 Referring Provider:   Flowsheet Row Pulmonary Rehab from 09/30/2022 in [facility] Cardiac and Pulmonary Rehab  Referring Provider Vito Berger MD       Initial Encounter Date:  Flowsheet Row Pulmonary Rehab from 09/30/2022 in [facility] Cardiac and Pulmonary Rehab  Date 09/30/22       Visit Diagnosis: Dyspnea on exertion  Patient's Home Medications on Admission:  Current Outpatient Medications:    albuterol (VENTOLIN HFA) 108 (90 Base) MCG/ACT inhaler, INHALE 2 PUFFS BY ORAL INHALATION EVERY 4 TO 6 HOURS AS NEEDED FOR CHRONIC OBSTRUCTIVE LUNG DISEASE BE SURE TO WASH MOUTHPIECE WITH WARM WATER ONCE A WEEK (Patient not taking: Reported on 10/28/2022), Disp: , Rfl:    apixaban (ELIQUIS) 5 MG TABS tablet, TAKE ONE TABLET BY MOUTH EVERY 12 HOURS CAUTION BLOOD THINNER, Disp: , Rfl:    aspirin 81 MG chewable tablet, CHEW ONE TABLET BY MOUTH EVERY DAY, Disp: , Rfl:    Ca Carbonate-Mag Hydroxide (ROLAIDS PO), Take 1 tablet by mouth at bedtime as needed (heartburn/acid reflux)., Disp: , Rfl:    cholecalciferol (VITAMIN D) 25 MCG (1000 UT) tablet, Take 1,000 Units by mouth 2 (two) times daily., Disp: , Rfl:    diclofenac sodium (VOLTAREN) 1 % GEL, Apply 1 application  topically daily as needed (pain)., Disp: , Rfl:    ezetimibe (ZETIA) 10 MG tablet, Take 1 tablet (10 mg total) by mouth daily., Disp: 90 tablet, Rfl: 3   famotidine (PEPCID) 20 MG tablet, Take 20 mg by mouth daily., Disp: , Rfl:    hydrocortisone cream 1 %, SMARTSIG:1 Topical Daily, Disp: , Rfl:    levofloxacin (LEVAQUIN) 750 MG tablet, TAKE ONE TABLET BY MOUTH ONCE FOR BACTEREMIA . DO NOT TAKE WITH DAIRY PRODUCTS;ANTACID;SUCRALFATE;IRON;ZINC. TAKE FOR FEVER MORE THAN 100.5 WHILE ON YOUR WAY TO CLOSEST EMERGENCY DEPARTMENT., Disp: , Rfl:    loratadine (CLARITIN) 10 MG tablet, TAKE ONE TABLET BY MOUTH EVERY DAY FOR  ALLERGIES, Disp: , Rfl:    losartan (COZAAR) 25 MG tablet, Take 0.5 tablets (12.5 mg total) by mouth daily., Disp: 15 tablet, Rfl: 1   metoprolol succinate (TOPROL XL) 25 MG 24 hr tablet, Take 0.5 tablets (12.5 mg total) by mouth daily., Disp: 45 tablet, Rfl: 3   nitroGLYCERIN (NITROSTAT) 0.4 MG SL tablet, Place 1 tablet (0.4 mg total) under the tongue every 5 (five) minutes x 3 doses as needed for chest pain., Disp: 25 tablet, Rfl: 12   oxymetazoline (AFRIN) 0.05 % nasal spray, Place 1 spray into both nostrils 2 (two) times daily as needed for congestion., Disp: , Rfl:    rosuvastatin (CRESTOR) 20 MG tablet, TAKE ONE TABLET BY MOUTH AT BEDTIME FOR CHOLESTEROL (Patient not taking: Reported on 01/01/2023), Disp: , Rfl:    Simethicone (GAS-X PO), Take 1 tablet by mouth 3 (three) times daily as needed (heartburn)., Disp: , Rfl:    simethicone (MYLICON) 80 MG chewable tablet, CHEW ONE TABLET BY MOUTH TWO TIMES A DAY, Disp: , Rfl:    STIOLTO RESPIMAT 2.5-2.5 MCG/ACT AERS, INHALE 2 INHALATIONS BY ORAL INHALATION IN THE MORNING FOR CHRONIC OBSTRUCTIVE LUNGS, Disp: , Rfl:   Past Medical History: Past Medical History:  Diagnosis Date   Actinic keratosis    Acute ST elevation myocardial infarction (STEMI) of anterior wall (HCC)    a. 02/2019 s/p PCI/DES ->LAD.   Apical mural thrombus following MI (HCC)    a.  02/2019 s/p Ant MI-->Echo: Small, fixed thrombus on the apical wall of LV-->coumadin.   Barrett's esophagus    CAD (coronary artery disease)    a. 02/2019 Ant STEMI/PCI: LM nl, LAD 100p (4x18 Resolute Onyx DES), LCX/RCA min irregs, EF 35-45%; b. 10/2022 MV: EF 38% (nl by echo). No isch/infarct.   Chronic back pain    Colon polyps    a. 07/2017 Colonoscopy: 5mm sigmoid polyp, tubular adenoma, diverticulosis, int hemorrhoids.   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 02/2019 Echo: EF 40-45%; b. 06/2019 Echo: EF 50-55%; c. 10/2022 Echo: EF 55-60%, no rwma, nl RV fxn, mild MR.   Hydrocele    Hyperlipidemia    Ischemic cardiomyopathy    a. 02/2019 Echo: EF 40-45%; b. 06/2019 Echo: EF 50-55%; c. 10/2022 Echo: EF 55-60%.   Myalgia    Osteoarthritis    C-spine   PAF (paroxysmal atrial fibrillation) (HCC)    a. CHA2DS2VASc = 3-->eliquis   Peripheral vascular disease (HCC)    a. 2018 s/p aorto-iliac stent graft; b. 11/2022 ABI/Duplex: R 1.24, L 1.02. Bilateral inflow dzs noted on duplex.   Reactive airway disease    Seborrheic keratosis    Sinus bradycardia    a. 02/2019 Jxnl rhythm and sinus brady in setting of Ant MI-->[facility] d/c'd.   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   Tobacco use disorder    Tremor     Tobacco Use: Social History   Tobacco Use  Smoking Status Every Day   Packs/day: 1.00   Years: 40.00   Additional pack years: 0.00   Total pack years: 40.00   Types: Cigarettes  Smokeless Tobacco Never    Labs: Review Flowsheet       Latest Ref Rng & Units 03/12/2019 05/03/2019  Labs for ITP Cardiac and Pulmonary Rehab  Cholestrol 100 - 199 mg/dL 161  96   LDL (calc) 0 - 99 mg/dL 59  46   HDL-C >09 mg/dL 31  34   Trlycerides 0 - 149 mg/dL 70  79   Hemoglobin U0A 4.8 - 5.6 % 6.0  -  TCO2 22 - 32 mmol/L 18  -     Pulmonary Assessment Scores:  Pulmonary Assessment Scores     Row Name 09/30/22 1517         ADL UCSD   ADL Phase Entry     SOB Score total 28     Rest 0     Walk 1     Stairs 3     Bath 1     Dress 1     Shop 1       CAT Score   CAT Score 9       mMRC Score   mMRC Score 1              UCSD: Self-administered rating of dyspnea associated with activities of daily living (ADLs) 6-point scale (0 = "not at all" to 5 = "maximal or unable to do because of breathlessness")  Scoring Scores range from 0 to 120.  Minimally important difference  is 5 units  CAT: CAT can identify the health impairment of COPD patients and is better correlated with disease progression.  CAT has a scoring range of zero to 40. The  CAT score is classified into four groups of low (less than 10), medium (10 - 20), high (21-30) and very high (31-40) based on the impact level of disease on health status. A CAT score over 10 suggests significant symptoms.  A worsening CAT score could be explained by an exacerbation, poor medication adherence, poor inhaler technique, or progression of COPD or comorbid conditions.  CAT MCID is 2 points  mMRC: mMRC (Modified Medical Research Council) Dyspnea Scale is used to assess the degree of baseline functional disability in patients of respiratory disease due to dyspnea. No minimal important difference is established. A decrease in score of 1 point or greater is considered a positive change.   Pulmonary Function Assessment:  Pulmonary Function Assessment - 09/28/22 1117       Breath   Shortness of Breath Yes;Limiting activity             Exercise Target Goals: Exercise Program Goal: Individual exercise prescription set using results from initial 6 min walk test and THRR while considering  patient's activity barriers and safety.   Exercise Prescription Goal: Initial exercise prescription builds to 30-45 minutes a day of aerobic activity, 2-3 days per week.  Home exercise guidelines will be given to patient during program as part of exercise prescription that the participant will acknowledge.  Education: Aerobic Exercise: - Group verbal and visual presentation on the components of exercise prescription. Introduces F.I.T.T principle from ACSM for exercise prescriptions.  Reviews F.I.T.T. principles of aerobic exercise including progression. Written material given at graduation. Flowsheet Row Pulmonary Rehab from 09/30/2022 in [facility] Cardiac and Pulmonary Rehab  Education need identified 09/30/22       Education:  Resistance Exercise: - Group verbal and visual presentation on the components of exercise prescription. Introduces F.I.T.T principle from ACSM for exercise prescriptions  Reviews F.I.T.T. principles of resistance exercise including progression. Written material given at graduation.    Education: Exercise & Equipment Safety: - Individual verbal instruction and demonstration of equipment use and safety with use of the equipment. Flowsheet Row Pulmonary Rehab from 09/30/2022 in [facility] Cardiac and Pulmonary Rehab  Date 09/28/22  Educator [facility]  Instruction Review Code 1- Verbalizes Understanding       Education: Exercise Physiology & General Exercise Guidelines: - Group verbal and written instruction with models to review the exercise physiology of the cardiovascular system and associated critical values. Provides general exercise guidelines with specific guidelines to those with heart or lung disease.    Education: Flexibility, Balance, Mind/Body Relaxation: - Group verbal and visual presentation with interactive activity on the components of exercise prescription. Introduces F.I.T.T principle from ACSM for exercise prescriptions. Reviews F.I.T.T. principles of flexibility and balance exercise training including progression. Also discusses the mind body connection.  Reviews various relaxation techniques to help reduce and manage stress (i.e. Deep breathing, progressive muscle relaxation, and visualization). Balance handout provided to take home. Written material given at graduation.   Activity Barriers & Risk Stratification:  Activity Barriers & Cardiac Risk Stratification - 09/30/22 1541       Activity Barriers & Cardiac Risk Stratification   Activity Barriers Joint Problems;Back Problems;Muscular Weakness;Deconditioning;Shortness of Breath;Neck/Spine Problems             6 Minute Walk:  6 Minute Walk     Row Name 09/30/22 1537         6 Minute Walk   Phase Initial     Distance  1730 feet     Walk Time 6 minutes     #  of Rest Breaks 0     MPH 3.28     METS 4.24     RPE 13     Perceived Dyspnea  0     VO2 Peak 14.84     Symptoms Yes (comment)     Comments leg tightness     Resting HR 51 bpm     Resting BP 98/52     Resting Oxygen Saturation  97 %     Exercise Oxygen Saturation  during 6 min walk 98 %     Max Ex. HR 84 bpm     Max Ex. BP 142/60     2 Minute Post BP 96/58       Interval HR   1 Minute HR 65     2 Minute HR 78     3 Minute HR 77     4 Minute HR 82     5 Minute HR 82     6 Minute HR 84     2 Minute Post HR 54     Interval Heart Rate? Yes       Interval Oxygen   Interval Oxygen? Yes     Baseline Oxygen Saturation % 97 %     1 Minute Oxygen Saturation % 97 %     1 Minute Liters of Oxygen 0 L  RA     2 Minute Oxygen Saturation % 97 %     2 Minute Liters of Oxygen 0 L  RA     3 Minute Oxygen Saturation % 97 %     3 Minute Liters of Oxygen 0 L  RA     4 Minute Oxygen Saturation % 98 %     4 Minute Liters of Oxygen 0 L  RA     5 Minute Oxygen Saturation % 98 %     5 Minute Liters of Oxygen 0 L  RA     6 Minute Oxygen Saturation % 97 %     6 Minute Liters of Oxygen 0 L  RA     2 Minute Post Oxygen Saturation % 98 %     2 Minute Post Liters of Oxygen 0 L  RA             Oxygen Initial Assessment:  Oxygen Initial Assessment - 09/28/22 1117       Home Oxygen   Home Oxygen Device None    Sleep Oxygen Prescription None    Home Exercise Oxygen Prescription None    Home Resting Oxygen Prescription None      Initial 6 min Walk   Oxygen Used None      Program Oxygen Prescription   Program Oxygen Prescription None      Intervention   Short Term Goals To learn and demonstrate proper use of respiratory medications;To learn and understand importance of maintaining oxygen saturations>88%;To learn and demonstrate proper pursed lip breathing techniques or other breathing techniques. ;To learn and understand importance of monitoring  SPO2 with pulse oximeter and demonstrate accurate use of the pulse oximeter.    Long  Term Goals Maintenance of O2 saturations>88%;Compliance with respiratory medication;Demonstrates proper use of MDI's;Exhibits proper breathing techniques, such as pursed lip breathing or other method taught during program session;Verbalizes importance of monitoring SPO2 with pulse oximeter and return demonstration             Oxygen Re-Evaluation:  Oxygen Re-Evaluation     Row Name 10/05/22 1401 10/23/22 1113 11/23/22 1007 12/18/22  1124       Program Oxygen Prescription   Program Oxygen Prescription -- None None None      Home Oxygen   Home Oxygen Device -- None None None    Sleep Oxygen Prescription -- None None None    Home Exercise Oxygen Prescription -- None None None    Home Resting Oxygen Prescription -- None None None      Goals/Expected Outcomes   Short Term Goals -- To learn and understand importance of maintaining oxygen saturations>88%;To learn and demonstrate proper pursed lip breathing techniques or other breathing techniques.  To learn and understand importance of monitoring SPO2 with pulse oximeter and demonstrate accurate use of the pulse oximeter.;To learn and understand importance of maintaining oxygen saturations>88%;To learn and demonstrate proper pursed lip breathing techniques or other breathing techniques.  To learn and understand importance of maintaining oxygen saturations>88%;To learn and understand importance of monitoring SPO2 with pulse oximeter and demonstrate accurate use of the pulse oximeter.    Long  Term Goals -- Exhibits proper breathing techniques, such as pursed lip breathing or other method taught during program session Verbalizes importance of monitoring SPO2 with pulse oximeter and return demonstration;Maintenance of O2 saturations>88%;Exhibits proper breathing techniques, such as pursed lip breathing or other method taught during program session Maintenance of O2  saturations>88%;Verbalizes importance of monitoring SPO2 with pulse oximeter and return demonstration    Comments Reviewed PLB technique with pt.  Talked about how it works and it's importance in maintaining their exercise saturations. Informed patient how to perform the Pursed Lipped breathing technique. Told patient to Inhale through the nose and out the mouth with pursed lips to keep their airways open, help oxygenate them better, practice when at rest or doing strenuous activity. Patient Verbalizes understanding of technique and will work on and be reiterated during [name]. Cletis Athens is doing well in rehab.  His saturations are doing well and he has been using PLB to help with breathing. Cletis Athens has a pulse oximeter to check his oxygen saturation at home. Informed and explained why it is important to have one. Reviewed that oxygen saturations should be 88 percent and above.    Goals/Expected Outcomes Short: Become more profiecient at using PLB.   Long: Become independent at using PLB. Short: use PLB with exertion. Long: use PLB on exertion proficiently and independently. Short: use PLB with exertion. Long: use PLB on exertion proficiently and independently and monitor saturations Short: monitor oxygen at home with exertion. Long: maintain oxygen saturations above 88 percent independently.             Oxygen Discharge (Final Oxygen Re-Evaluation):  Oxygen Re-Evaluation - 12/18/22 1124       Program Oxygen Prescription   Program Oxygen Prescription None      Home Oxygen   Home Oxygen Device None    Sleep Oxygen Prescription None    Home Exercise Oxygen Prescription None    Home Resting Oxygen Prescription None      Goals/Expected Outcomes   Short Term Goals To learn and understand importance of maintaining oxygen saturations>88%;To learn and understand importance of monitoring SPO2 with pulse oximeter and demonstrate accurate use of the pulse oximeter.    Long  Term Goals Maintenance of O2  saturations>88%;Verbalizes importance of monitoring SPO2 with pulse oximeter and return demonstration    Comments Cletis Athens has a pulse oximeter to check his oxygen saturation at home. Informed and explained why it is important to have one. Reviewed that oxygen saturations should be  88 percent and above.    Goals/Expected Outcomes Short: monitor oxygen at home with exertion. Long: maintain oxygen saturations above 88 percent independently.             Initial Exercise Prescription:  Initial Exercise Prescription - 09/30/22 1500       Date of Initial Exercise RX and Referring Provider   Date 09/30/22    Referring Provider Vito Berger MD      Oxygen   Maintain Oxygen Saturation 88% or higher      Treadmill   MPH 3    Grade 1    Minutes 15    METs 3.71      Recumbant Bike   Level 3    Watts 57    Minutes 15    METs 4.24      NuStep   Level 4    Minutes 15    METs 4.24      Prescription Details   Frequency (times per week) 2    Duration Progress to 30 minutes of continuous aerobic without signs/symptoms of physical distress      Intensity   THRR 40-80% of Max Heartrate 92-133    Ratings of Perceived Exertion 11-13    Perceived Dyspnea 0-4      Progression   Progression Continue to progress workloads to maintain intensity without signs/symptoms of physical distress.      Resistance Training   Training Prescription Yes    Weight 4 lb    Reps 10-15             Perform Capillary Blood Glucose checks as needed.  Exercise Prescription Changes:   Exercise Prescription Changes     Row Name 09/30/22 1500 10/13/22 1400 10/29/22 1300 10/30/22 1100 11/10/22 1500     Response to Exercise   Blood Pressure (Admit) 98/52 108/60 94/60 -- 108/64   Blood Pressure (Exercise) 142/60 124/68 148/62 -- --   Blood Pressure (Exit) 96/58 104/62 102/62 -- 98/56   Heart Rate (Admit) 51 bpm 55 bpm 49 bpm -- 49 bpm   Heart Rate (Exercise) 84 bpm 85 bpm 111 bpm -- 88 bpm   Heart  Rate (Exit) 54 bpm 62 bpm 63 bpm -- 59 bpm   Oxygen Saturation (Admit) 97 % 98 % 98 % -- 97 %   Oxygen Saturation (Exercise) 97 % 92 % 95 % -- 93 %   Oxygen Saturation (Exit) 98 % 97 % 96 % -- 96 %   Rating of Perceived Exertion (Exercise) 13 13 14  -- 12   Perceived Dyspnea (Exercise) 0 1 0 -- 0   Symptoms leg tightness slight SOB none -- none   Comments Results 2nd full day of exercise -- -- --   Duration -- Continue with 30 min of aerobic exercise without signs/symptoms of physical distress. Continue with 30 min of aerobic exercise without signs/symptoms of physical distress. -- Continue with 30 min of aerobic exercise without signs/symptoms of physical distress.   Intensity -- THRR unchanged THRR unchanged -- THRR unchanged     Progression   Progression -- Continue to progress workloads to maintain intensity without signs/symptoms of physical distress. Continue to progress workloads to maintain intensity without signs/symptoms of physical distress. -- Continue to progress workloads to maintain intensity without signs/symptoms of physical distress.   Average METs -- 4.84 5.92 -- 6.59     Resistance Training   Training Prescription -- Yes Yes -- Yes   Weight -- 6 lb 6 lb --  6 lb   Reps -- 10-15 10-15 -- 10-15     Interval Training   Interval Training -- No No -- No     Treadmill   MPH -- 3.8 4 -- 3.7   Grade -- 1 5 -- 5.5   Minutes -- 15 30 -- 15   METs -- 4.43 6.82 -- 6.64     Recumbant Elliptical   Level -- 11 -- -- --   Minutes -- 15 -- -- --   METs -- 6.2 -- -- --     REL-XR   Level -- -- 12 -- 13   Minutes -- -- 15 -- 15   METs -- -- 7.8 -- 9     T5 Nustep   Level -- -- 5 -- --   Minutes -- -- 15 -- --   METs -- -- 4 -- --     Home Exercise Plan   Plans to continue exercise at -- -- -- Home (comment)  walking, working in [employer] (comment)  walking, working in garden   Frequency -- -- -- Add 2 additional days to program exercise sessions. Add 2 additional  days to program exercise sessions.   Initial Home Exercises Provided -- -- -- 10/30/22 10/30/22     Oxygen   Maintain Oxygen Saturation -- -- 88% or higher -- 88% or higher    Row Name 11/24/22 0800 12/08/22 1400 12/23/22 1400 01/07/23 0800 01/20/23 1600     Response to Exercise   Blood Pressure (Admit) 112/58 124/70 100/60 130/58 100/58   Blood Pressure (Exit) 108/56 100/54 124/60 102/56 96/56   Heart Rate (Admit) 52 bpm 74 bpm 51 bpm 50 bpm 48 bpm   Heart Rate (Exercise) 81 bpm 100 bpm 93 bpm 98 bpm 104 bpm   Heart Rate (Exit) 65 bpm 69 bpm 59 bpm 70 bpm 69 bpm   Oxygen Saturation (Admit) 96 % 98 % 97 % 96 % 98 %   Oxygen Saturation (Exercise) 95 % 95 % 95 % 98 % 94 %   Oxygen Saturation (Exit) 97 % 96 % 97 % 96 % 95 %   Rating of Perceived Exertion (Exercise) 12 14 14 14 14    Perceived Dyspnea (Exercise) 0 1 0 0 0   Symptoms none none none none none   Duration Continue with 30 min of aerobic exercise without signs/symptoms of physical distress. Continue with 30 min of aerobic exercise without signs/symptoms of physical distress. Continue with 30 min of aerobic exercise without signs/symptoms of physical distress. Continue with 30 min of aerobic exercise without signs/symptoms of physical distress. Continue with 30 min of aerobic exercise without signs/symptoms of physical distress.   Intensity THRR unchanged THRR unchanged THRR unchanged THRR unchanged THRR unchanged     Progression   Progression Continue to progress workloads to maintain intensity without signs/symptoms of physical distress. Continue to progress workloads to maintain intensity without signs/symptoms of physical distress. Continue to progress workloads to maintain intensity without signs/symptoms of physical distress. Continue to progress workloads to maintain intensity without signs/symptoms of physical distress. Continue to progress workloads to maintain intensity without signs/symptoms of physical distress.   Average  METs 4.77 5.66 5.85 7.5 6.96     Resistance Training   Training Prescription Yes Yes Yes Yes Yes   Weight 6 lb 6 lb 6 lb 6lb 6 lb   Reps 10-15 10-15 10-15 10-15 10-15     Interval Training   Interval Training No No No No  No     Treadmill   MPH 3.4 3.4  up to 3.8 3.4 3.4 3.8   Grade 2.5 6  up to 6.5 7 5 7    Minutes 30 30 30 30 30    METs 4.77 7.9 6.88 7.35 7.57     NuStep   Level -- 4 -- -- --   Minutes -- 15 -- -- --   METs -- 5.7 -- -- --     T5 Nustep   Level -- -- 4 -- --   Minutes -- -- 30 -- --   METs -- -- 2.2 -- --     Home Exercise Plan   Plans to continue exercise at Home (comment)  walking, working in garden Home (comment)  walking, working in [employer] (comment)  walking, working in garden Home (comment)  walking  working in [employer] (comment)  walking  working in garden   Frequency Add 2 additional days to program exercise sessions. Add 2 additional days to program exercise sessions. Add 2 additional days to program exercise sessions. Add 2 additional days to program exercise sessions. Add 2 additional days to program exercise sessions.   Initial Home Exercises Provided 10/30/22 10/30/22 10/30/22 10/30/22 10/30/22     Oxygen   Maintain Oxygen Saturation 88% or higher 88% or higher 88% or higher 88% or higher 88% or higher            Exercise Comments:   Exercise Comments     Row Name 10/05/22 1401           Exercise Comments First full day of exercise!  Patient was oriented to gym and equipment including functions, settings, policies, and procedures.  Patient's individual exercise prescription and treatment plan were reviewed.  All starting workloads were established based on the results of the 6 minute walk test done at initial orientation visit.  The plan for exercise progression was also introduced and progression will be customized based on patient's performance and goals.                Exercise Goals and Review:   Exercise Goals      Row Name 09/30/22 1541             Exercise Goals   Increase Physical Activity Yes       Intervention Provide advice, education, support and counseling about physical activity/exercise needs.;Develop an individualized exercise prescription for aerobic and resistive training based on initial evaluation findings, risk stratification, comorbidities and participant's personal goals.       Expected Outcomes Short Term: Attend rehab on a regular basis to increase amount of physical activity.;Long Term: Add in home exercise to make exercise part of routine and to increase amount of physical activity.;Long Term: Exercising regularly at least 3-5 days a week.       Increase Strength and Stamina Yes       Intervention Provide advice, education, support and counseling about physical activity/exercise needs.;Develop an individualized exercise prescription for aerobic and resistive training based on initial evaluation findings, risk stratification, comorbidities and participant's personal goals.       Expected Outcomes Short Term: Increase workloads from initial exercise prescription for resistance, speed, and METs.;Short Term: Perform resistance training exercises routinely during rehab and add in resistance training at home;Long Term: Improve cardiorespiratory fitness, muscular endurance and strength as measured by increased METs and functional capacity ( )       Able to understand and use rate of perceived exertion (RPE) scale  Yes       Intervention Provide education and explanation on how to use RPE scale       Expected Outcomes Short Term: Able to use RPE daily in rehab to express subjective intensity level;Long Term:  Able to use RPE to guide intensity level when exercising independently       Able to understand and use Dyspnea scale Yes       Intervention Provide education and explanation on how to use Dyspnea scale       Expected Outcomes Short Term: Able to use Dyspnea scale daily in rehab to  express subjective sense of shortness of breath during exertion;Long Term: Able to use Dyspnea scale to guide intensity level when exercising independently       Knowledge and understanding of Target Heart Rate Range (THRR) Yes       Intervention Provide education and explanation of THRR including how the numbers were predicted and where they are located for reference       Expected Outcomes Short Term: Able to state/look up THRR;Short Term: Able to use daily as guideline for intensity in rehab;Long Term: Able to use THRR to govern intensity when exercising independently       Able to check pulse independently Yes       Intervention Provide education and demonstration on how to check pulse in carotid and radial arteries.;Review the importance of being able to check your own pulse for safety during independent exercise       Expected Outcomes Short Term: Able to explain why pulse checking is important during independent exercise;Long Term: Able to check pulse independently and accurately       Understanding of Exercise Prescription Yes       Intervention Provide education, explanation, and written materials on patient's individual exercise prescription       Expected Outcomes Short Term: Able to explain program exercise prescription;Long Term: Able to explain home exercise prescription to exercise independently                Exercise Goals Re-Evaluation :  Exercise Goals Re-Evaluation     Row Name 10/05/22 1402 10/13/22 1451 10/29/22 1340 10/30/22 1116 11/10/22 1511     Exercise Goal Re-Evaluation   Exercise Goals Review Increase Physical Activity;Able to understand and use rate of perceived exertion (RPE) scale;Knowledge and understanding of Target Heart Rate Range (THRR);Understanding of Exercise Prescription;Increase Strength and Stamina;Able to check pulse independently Increase Physical Activity;Increase Strength and Stamina;Understanding of Exercise Prescription Increase Physical  Activity;Increase Strength and Stamina;Understanding of Exercise Prescription Increase Physical Activity;Increase Strength and Stamina;Able to understand and use rate of perceived exertion (RPE) scale;Able to understand and use Dyspnea scale;Knowledge and understanding of Target Heart Rate Range (THRR);Able to check pulse independently;Understanding of Exercise Prescription Increase Physical Activity;Increase Strength and Stamina;Understanding of Exercise Prescription   Comments Reviewed RPE scale, THR and program prescription with pt today.  Pt voiced understanding and was given a copy of goals to take home. Nicola Girt is off to a good start with the program. He is already working at pretty significant high loads, around level 11 on the XR and up to 3.8 mph speed on the treadmill, all with appropriate RPEs. We will continue to monitor his progress as he furthers in the program. Cletis Athens is doing well in rehab. He recently increased his overall average MET level to 5.92 METs. He also increased his treadmill workload to 4 mph with an incline of 5%, and he walked at this pace for  30 minutes. He improved to level 12 on the XR and level 5 on the T5 nustep as well. We will continue to monitor his progress in the program. Reviewed home exercise with pt today.  Pt plans to add in some walking in the garden for exercise. Reviewed THR, pulse, RPE, sign and symptoms, pulse oximetery and when to call 911 or MD.  Also discussed weather considerations and indoor options.  Pt voiced understanding. We talked about the importance of adding in home exerise. Keonne has been doing well in rehab. He has increased to a 3.7/5.5% treadmill workload which he completes for 30 minutes at a time.  He increased to level 13 on the XR, working over 9 METS!  His oxygen is staying well above 88%. We will continue to monitor.   Expected Outcomes Short: Use RPE daily to regulate intensity.  Long: Follow program prescription in THR. Short: Follow  current exercise prescription, progress workloads slowly and gradually Long: Increase overall strength and stamina Short: Continue to progressively increase workloads. Long: Continue to improve strength and stamina. Short: Add in some exercise at home Long: Conitnue to improve stamina Short: Add in intervals to regimen Long: Continue to increase overall MET level and stamina    Row Name 11/23/22 0959 11/24/22 0808 12/08/22 1433 12/23/22 1424 01/07/23 0826     Exercise Goal Re-Evaluation   Exercise Goals Review Increase Physical Activity;Increase Strength and Stamina;Understanding of Exercise Prescription Increase Physical Activity;Increase Strength and Stamina;Understanding of Exercise Prescription Increase Physical Activity;Increase Strength and Stamina;Understanding of Exercise Prescription Increase Physical Activity;Increase Strength and Stamina;Understanding of Exercise Prescription Increase Physical Activity;Increase Strength and Stamina;Understanding of Exercise Prescription   Comments Cletis Athens returned to rehab today.  He has been staying active by working in the garden while he was out.  He is happy to be back and ready to get back to exercise again. Cletis Athens has only attended rehab once since the last review. During that one session he walked on the treadmill for 30 minutes at a speed of 3.4 mph with an incline of 2.5%. He also has continued to use 6 lb hand weights for resistance training. We will continue to monitor his progress in the program. Cletis Athens continues to do well in rehab. He prefers to walk on the treadmill and continues to use that as his primary mode of exercise. Staff will continue to encourage patient to use a variety of machines. He has increased his speed on the treadmill, varying between 3.4-3.8 mph with a range of 2.5-6% incline. It does not appear that he is completing intervals. His RPEs remain in appropriate range. Will continue to monitor. Cletis Athens continues to do well in rehab. He has  consistently walked the treadmill for 30 minutes at a speed of 3.4 mph with incline ranging from 6 to 7%. He also continues to work on the T5 nustep at level 4 and use 6 lb hand weights for resistance training as well. We will continue to monitor his progress in the program. JOn conmtinues to do well with his exercise progression. He has one time done 7/5% on TM. He has stayed steady eith 3.5/8% other days. He continues with 6lb hand weights.  We will continue to monitor his progress in the program.   Expected Outcomes Short: Continue to return to routine exercise Long: conitnue to improve stamina Short: Attend rehab regularly. Long: Continue to improve strength and stamina. Short: Keep consistent workloads on treadmill, try different seated machines Long: Continue to increase overall MET level  and stamina Short: Keep consistent workloads on treadmill, try different seated machines. Long: Continue to increase overall MET level and stamina. STG: Work on other equipment for variation. LTG COntinued progression with exercise    Row Name 01/20/23 1624             Exercise Goal Re-Evaluation   Exercise Goals Review Increase Physical Activity;Increase Strength and Stamina;Understanding of Exercise Prescription       Comments Cletis Athens is doing well in the program. He continues to walk the treadmill for 30 minutes but did increase his workload to a speed of 3.8 mph with an incline of 7%. He also continues to use 6 lb hand weights for resistance training as well. We will continue to monitor his progress.       Expected Outcomes Short: Try other equipment for variation. Long: Continue to improve strength and stamina.                Discharge Exercise Prescription (Final Exercise Prescription Changes):  Exercise Prescription Changes - 01/20/23 1600       Response to Exercise   Blood Pressure (Admit) 100/58    Blood Pressure (Exit) 96/56    Heart Rate (Admit) 48 bpm    Heart Rate (Exercise) 104 bpm     Heart Rate (Exit) 69 bpm    Oxygen Saturation (Admit) 98 %    Oxygen Saturation (Exercise) 94 %    Oxygen Saturation (Exit) 95 %    Rating of Perceived Exertion (Exercise) 14    Perceived Dyspnea (Exercise) 0    Symptoms none    Duration Continue with 30 min of aerobic exercise without signs/symptoms of physical distress.    Intensity THRR unchanged      Progression   Progression Continue to progress workloads to maintain intensity without signs/symptoms of physical distress.    Average METs 6.96      Resistance Training   Training Prescription Yes    Weight 6 lb    Reps 10-15      Interval Training   Interval Training No      Treadmill   MPH 3.8    Grade 7    Minutes 30    METs 7.57      Home Exercise Plan   Plans to continue exercise at Home (comment)   walking  working in garden   Frequency Add 2 additional days to program exercise sessions.    Initial Home Exercises Provided 10/30/22      Oxygen   Maintain Oxygen Saturation 88% or higher             Nutrition:  Target Goals: Understanding of nutrition guidelines, daily intake of sodium 1500mg , cholesterol 200mg , calories 30% from fat and 7% or less from saturated fats, daily to have 5 or more servings of fruits and vegetables.  Education: All About Nutrition: -Group instruction provided by verbal, written material, interactive activities, discussions, models, and posters to present general guidelines for heart healthy nutrition including fat, fiber, MyPlate, the role of sodium in heart healthy nutrition, utilization of the nutrition label, and utilization of this knowledge for meal planning. Follow up email sent as well. Written material given at graduation.   Biometrics:  Pre Biometrics - 09/30/22 1542       Pre Biometrics   Height 6' 2.25" (1.886 m)    Weight 176 lb 14.4 oz (80.2 kg)    Waist Circumference 34 inches    Hip Circumference 40 inches    Waist  to Hip Ratio 0.85 %    BMI (Calculated) 22.56     Single Leg Stand 30 seconds              Nutrition Therapy Plan and Nutrition Goals:  Nutrition Therapy & Goals - 09/30/22 1520       Intervention Plan   Intervention Prescribe, educate and counsel regarding individualized specific dietary modifications aiming towards targeted core components such as weight, hypertension, lipid management, diabetes, heart failure and other comorbidities.    Expected Outcomes Short Term Goal: Understand basic principles of dietary content, such as calories, fat, sodium, cholesterol and nutrients.;Short Term Goal: A plan has been developed with personal nutrition goals set during dietitian appointment.;Long Term Goal: Adherence to prescribed nutrition plan.             Nutrition Assessments:  MEDIFICTS Score Key: ?70 Need to make dietary changes  40-70 Heart Healthy Diet ? 40 Therapeutic Level Cholesterol Diet  Flowsheet Row Pulmonary Rehab from 09/30/2022 in [facility]  Picture Your Plate Total Score on Admission 53      Picture Your Plate Scores: <82 Unhealthy dietary pattern with much room for improvement. 41-50 Dietary pattern unlikely to meet recommendations for good health and room for improvement. 51-60 More healthful dietary pattern, with some room for improvement.  >60 Healthy dietary pattern, although there may be some specific behaviors that could be improved.   Nutrition Goals Re-Evaluation:  Nutrition Goals Re-Evaluation     Row Name 10/23/22 1120 11/23/22 1003 12/18/22 1125         Goals   Current Weight 181 lb (82.1 kg) -- 180 lb (81.6 kg)     Nutrition Goal Eat smaller portions. Short: Choose and plan snacks accordingly to patients caloric intake to improve breathing. Long: Maintain a diet independently that meets their caloric intake to aid in daily shortness of breath. --     Comment Patient was informed on why it is important to maintain a balanced diet when dealing with Respiratory issues.  Explained that it takes a lot of energy to breath and when they are short of breath often they will need to have a good diet to help keep up with the calories they are expending for breathing. Jon conintues to work on his diet.  He feels that he is doing pretty good with his diet.  He continues to make changes and trying to find healthier options.  We talked about staying away from salt with heart history as well. Patient would not like to meet with the dietician. Will follow up next review.     Expected Outcome Short: Choose and plan snacks accordingly to patients caloric intake to improve breathing. Long: Maintain a diet independently that meets their caloric intake to aid in daily shortness of breath. Short; conitnue to make healthier choices Long: Conitnue to improve balance --              Nutrition Goals Discharge (Final Nutrition Goals Re-Evaluation):  Nutrition Goals Re-Evaluation - 12/18/22 1125       Goals   Current Weight 180 lb (81.6 kg)    Comment Patient would not like to meet with the dietician. Will follow up next review.             Psychosocial: Target Goals: Acknowledge presence or absence of significant depression and/or stress, maximize coping skills, provide positive support system. Participant is able to verbalize types and ability to use techniques and skills needed for  reducing stress and depression.   Education: Stress, Anxiety, and Depression - Group verbal and visual presentation to define topics covered.  Reviews how body is impacted by stress, anxiety, and depression.  Also discusses healthy ways to reduce stress and to treat/manage anxiety and depression.  Written material given at graduation.   Education: Sleep Hygiene -Provides group verbal and written instruction about how sleep can affect your health.  Define sleep hygiene, discuss sleep cycles and impact of sleep habits. Review good sleep hygiene tips.    Initial Review & Psychosocial Screening:   Initial Psych Review & Screening - 09/28/22 1120       Initial Review   Current issues with Current Stress Concerns    Comments He can look to his Wife, kids and grandkids for support. He is having more shortness of breath than usual and wants to get that under control. He has done with Cardiac Rehab program in 2020.      Family Dynamics   Good Support System? Yes      Barriers   Psychosocial barriers to participate in program The patient should benefit from training in stress management and relaxation.      Screening Interventions   Interventions Encouraged to exercise;To provide support and resources with identified psychosocial needs;Program counselor consult;Provide feedback about the scores to participant    Expected Outcomes Short Term goal: Utilizing psychosocial counselor, staff and physician to assist with identification of specific Stressors or current issues interfering with healing process. Setting desired goal for each stressor or current issue identified.;Long Term Goal: Stressors or current issues are controlled or eliminated.;Short Term goal: Identification and review with participant of any Quality of Life or Depression concerns found by scoring the questionnaire.;Long Term goal: The participant improves quality of Life and PHQ9 Scores as seen by post scores and/or verbalization of changes             Quality of Life Scores:  Scores of 19 and below usually indicate a poorer quality of life in these areas.  A difference of  2-3 points is a clinically meaningful difference.  A difference of 2-3 points in the total score of the Quality of Life Index has been associated with significant improvement in overall quality of life, self-image, physical symptoms, and general health in studies assessing change in quality of life.  PHQ-9: Review Flowsheet       10/23/2022 09/30/2022 10/23/2019 05/01/2019  Depression screen PHQ 2/9  Decreased Interest 0 0 0 0  Down, Depressed,  Hopeless 0 0 0 0  PHQ - 2 Score 0 0 0 0  Altered sleeping 0 0 0 0  Tired, decreased energy 3 3 1 1   Change in appetite 0 2 0 1  Feeling bad or failure about yourself  0 0 0 0  Trouble concentrating 0 0 0 0  Moving slowly or fidgety/restless 0 0 0 0  Suicidal thoughts 0 0 0 0  PHQ-9 Score 3 5 1 2   Difficult doing work/chores Somewhat difficult Very difficult Somewhat difficult Somewhat difficult   Interpretation of Total Score  Total Score Depression Severity:  1-4 = Minimal depression, 5-9 = Mild depression, 10-14 = Moderate depression, 15-19 = Moderately severe depression, 20-27 = Severe depression   Psychosocial Evaluation and Intervention:  Psychosocial Evaluation - 09/28/22 1121       Psychosocial Evaluation & Interventions   Interventions Encouraged to exercise with the program and follow exercise prescription;Relaxation education;Stress management education    Comments He can look  to his Wife, kids and grandkids for support. He is having more shortness of breath than usual and wants to get that under control. He has done with Cardiac Rehab program in 2020.    Expected Outcomes Short: Start [name] to help with mood. Long: Maintain a healthy mental state.    Continue Psychosocial Services  Follow up required by staff             Psychosocial Re-Evaluation:  Psychosocial Re-Evaluation     Row Name 10/23/22 1117 11/23/22 1001 12/18/22 1127         Psychosocial Re-Evaluation   Current issues with Current Stress Concerns Current Stress Concerns None Identified     Comments Reviewed patient health questionnaire (PHQ-9) with patient for follow up. Previously, patients score indicated signs/symptoms of depression.  Reviewed to see if patient is improving symptom wise while in program.  Score improved and patient states that it is because he has been able to increase his exercise level. Cletis Athens has been out with a heart workup after a nuclear stress test, but echo found everything  to be normal, so he is pleased with that. He is feeling pretty good otherwise and just meet things where they are and tackles the daily tasks.  He tries to not get too stressed.  He is sleeping well for most part. Patient reports no issues with their current mental states, sleep, stress, depression or anxiety. Will follow up with patient in a few weeks for any changes.     Expected Outcomes Short: Continue to attend [name] regularly for regular exercise and social engagement. Long: Continue to improve symptoms and manage a positive mental state. Short; Return to routine exercise for mental boost Long: Continue to stay positive Short: Continue to exercise regularly to support mental health and notify staff of any changes. Long: maintain mental health and well being through teaching of rehab or prescribed medications independently.     Interventions Encouraged to attend Pulmonary Rehabilitation for the exercise Encouraged to attend Pulmonary Rehabilitation for the exercise Encouraged to attend Pulmonary Rehabilitation for the exercise     Continue Psychosocial Services  Follow up required by staff -- Follow up required by staff              Psychosocial Discharge (Final Psychosocial Re-Evaluation):  Psychosocial Re-Evaluation - 12/18/22 1127       Psychosocial Re-Evaluation   Current issues with None Identified    Comments Patient reports no issues with their current mental states, sleep, stress, depression or anxiety. Will follow up with patient in a few weeks for any changes.    Expected Outcomes Short: Continue to exercise regularly to support mental health and notify staff of any changes. Long: maintain mental health and well being through teaching of rehab or prescribed medications independently.    Interventions Encouraged to attend Pulmonary Rehabilitation for the exercise    Continue Psychosocial Services  Follow up required by staff             Education: Education Goals:  Education classes will be provided on a weekly basis, covering required topics. Participant will state understanding/return demonstration of topics presented.  Learning Barriers/Preferences:  Learning Barriers/Preferences - 09/28/22 1116       Learning Barriers/Preferences   Learning Barriers None    Learning Preferences Individual Instruction             General Pulmonary Education Topics:  Infection Prevention: - Provides verbal and written material to individual with discussion of infection  control including proper hand washing and proper equipment cleaning during exercise session. Flowsheet Row Pulmonary Rehab from 09/30/2022 in [facility] Cardiac and Pulmonary Rehab  Date 09/28/22  Educator [facility]  Instruction Review Code 1- Verbalizes Understanding       Falls Prevention: - Provides verbal and written material to individual with discussion of falls prevention and safety. Flowsheet Row Pulmonary Rehab from 09/30/2022 in [facility] Cardiac and Pulmonary Rehab  Date 09/28/22  Educator [facility]  Instruction Review Code 1- Verbalizes Understanding       Chronic Lung Disease Review: - Group verbal instruction with posters, models, PowerPoint presentations and videos,  to review new updates, new respiratory medications, new advancements in procedures and treatments. Providing information on websites and "800" numbers for continued self-education. Includes information about supplement oxygen, available portable oxygen systems, continuous and intermittent flow rates, oxygen safety, concentrators, and Medicare reimbursement for oxygen. Explanation of Pulmonary Drugs, including class, frequency, complications, importance of spacers, rinsing mouth after steroid MDI's, and proper cleaning methods for nebulizers. Review of basic lung anatomy and physiology related to function, structure, and complications of lung disease. Review of risk factors. Discussion about methods for diagnosing sleep apnea and types of  masks and machines for OSA. Includes a review of the use of types of environmental controls: home humidity, furnaces, filters, dust mite/pet prevention, HEPA vacuums. Discussion about weather changes, air quality and the benefits of nasal washing. Instruction on Warning signs, infection symptoms, calling MD promptly, preventive modes, and value of vaccinations. Review of effective airway clearance, coughing and/or vibration techniques. Emphasizing that all should Create an Action Plan. Written material given at graduation.   AED/CPR: - Group verbal and written instruction with the use of models to demonstrate the basic use of the AED with the basic ABC's of resuscitation.    Anatomy and Cardiac Procedures: - Group verbal and visual presentation and models provide information about basic cardiac anatomy and function. Reviews the testing methods done to diagnose heart disease and the outcomes of the test results. Describes the treatment choices: Medical Management, Angioplasty, or Coronary Bypass Surgery for treating various heart conditions including Myocardial Infarction, Angina, Valve Disease, and Cardiac Arrhythmias.  Written material given at graduation.   Medication Safety: - Group verbal and visual instruction to review commonly prescribed medications for heart and lung disease. Reviews the medication, class of the drug, and side effects. Includes the steps to properly store meds and maintain the prescription regimen.  Written material given at graduation.   Other: -Provides group and verbal instruction on various topics (see comments)   Knowledge Questionnaire Score:  Knowledge Questionnaire Score - 09/30/22 1520       Knowledge Questionnaire Score   Pre Score 15/18              Core Components/Risk Factors/Patient Goals at Admission:  Personal Goals and Risk Factors at Admission - 09/28/22 1118       Core Components/Risk Factors/Patient Goals on Admission    Weight  Management Yes;Weight Maintenance;Weight Gain    Intervention Weight Management: Develop a combined nutrition and exercise program designed to reach desired caloric intake, while maintaining appropriate intake of nutrient and fiber, sodium and fats, and appropriate energy expenditure required for the weight goal.;Weight Management: Provide education and appropriate resources to help participant work on and attain dietary goals.;Weight Management/Obesity: Establish reasonable short term and long term weight goals.    Expected Outcomes Short Term: Continue to assess and modify interventions until short term weight is achieved;Long Term: Adherence  to nutrition and physical activity/exercise program aimed toward attainment of established weight goal;Weight Maintenance: Understanding of the daily nutrition guidelines, which includes 25-35% calories from fat, 7% or less cal from saturated fats, less than 200mg  cholesterol, less than 1.5gm of sodium, & 5 or more servings of fruits and vegetables daily;Understanding recommendations for meals to include 15-35% energy as protein, 25-35% energy from fat, 35-60% energy from carbohydrates, less than 200mg  of dietary cholesterol, 20-35 gm of total fiber daily;Understanding of distribution of calorie intake throughout the day with the consumption of 4-5 meals/snacks;Weight Gain: Understanding of general recommendations for a high calorie, high protein meal plan that promotes weight gain by distributing calorie intake throughout the day with the consumption for 4-5 meals, snacks, and/or supplements    Tobacco Cessation Yes    Intervention Assist the participant in steps to quit. Provide individualized education and counseling about committing to Tobacco Cessation, relapse prevention, and pharmacological support that can be provided by physician.;Education officer, environmental, assist with locating and accessing local/national Quit Smoking programs, and support quit date choice.     Expected Outcomes Short Term: Will demonstrate readiness to quit, by selecting a quit date.;Short Term: Will quit all tobacco product use, adhering to prevention of relapse plan.;Long Term: Complete abstinence from all tobacco products for at least 12 months from quit date.    Improve shortness of breath with ADL's Yes    Intervention Provide education, individualized exercise plan and daily activity instruction to help decrease symptoms of SOB with activities of daily living.    Expected Outcomes Short Term: Improve cardiorespiratory fitness to achieve a reduction of symptoms when performing ADLs;Long Term: Be able to perform more ADLs without symptoms or delay the onset of symptoms    Hypertension Yes    Intervention Provide education on lifestyle modifcations including regular physical activity/exercise, weight management, moderate sodium restriction and increased consumption of fresh fruit, vegetables, and low fat dairy, alcohol moderation, and smoking cessation.;Monitor prescription use compliance.    Expected Outcomes Short Term: Continued assessment and intervention until BP is < 140/32mm HG in hypertensive participants. < 130/49mm HG in hypertensive participants with diabetes, heart failure or chronic kidney disease.;Long Term: Maintenance of blood pressure at goal levels.    Lipids Yes    Intervention Provide education and support for participant on nutrition & aerobic/resistive exercise along with prescribed medications to achieve LDL 70mg , HDL >40mg .    Expected Outcomes Short Term: Participant states understanding of desired cholesterol values and is compliant with medications prescribed. Participant is following exercise prescription and nutrition guidelines.;Long Term: Cholesterol controlled with medications as prescribed, with individualized exercise RX and with personalized nutrition plan. Value goals: LDL < 70mg , HDL > 40 mg.             Education:Diabetes - Individual verbal  and written instruction to review signs/symptoms of diabetes, desired ranges of glucose level fasting, after meals and with exercise. Acknowledge that pre and post exercise glucose checks will be done for 3 sessions at entry of program.   Know Your Numbers and Heart Failure: - Group verbal and visual instruction to discuss disease risk factors for cardiac and pulmonary disease and treatment options.  Reviews associated critical values for Overweight/Obesity, Hypertension, Cholesterol, and Diabetes.  Discusses basics of heart failure: signs/symptoms and treatments.  Introduces Heart Failure Zone chart for action plan for heart failure.  Written material given at graduation. Flowsheet Row Pulmonary Rehab from 09/30/2022 in [facility] Cardiac and Pulmonary Rehab  Education need identified 09/30/22  Core Components/Risk Factors/Patient Goals Review:   Goals and Risk Factor Review     Row Name 10/23/22 1115 11/23/22 1004 12/18/22 1128         Core Components/Risk Factors/Patient Goals Review   Personal Goals Review Improve shortness of breath with ADL's Improve shortness of breath with ADL's;Tobacco Cessation;Weight Management/Obesity;Hypertension Tobacco Cessation     Review Spoke to patient about their shortness of breath and what they can do to improve. Patient has been informed of breathing techniques when starting the program. Patient is informed to tell staff if they have had any med changes and that certain meds they are taking or not taking can be causing shortness of breath. Cletis Athens is doing well in rehab. His weight is staying steady for most part.  His breathing is doing well and he feels he is doing well.  He is still smoking a pack each day with no plans to quit.  His pressures are doing well.  He is feeling pretty good overall.  He had some acupuncture to help with plantar facsitis and has more testing coming up next week. Cletis Athens has been cutting back on smoking. He is cuting back and telling  himself to wait a little longer until the next one. He is trying to quit but putting off smoking and fighting his urges. He is not interested in taking medications to help him quit.     Expected Outcomes Short: Attend [name] regularly to improve shortness of breath with ADL's. Long: maintain independence with ADL's Short: Keep [name] updated with testing.  Long: Conitnue to work on breathing Short: reduce tobacco use. Long: Be tobacco free.              Core Components/Risk Factors/Patient Goals at Discharge (Final Review):   Goals and Risk Factor Review - 12/18/22 1128       Core Components/Risk Factors/Patient Goals Review   Personal Goals Review Tobacco Cessation    Review Cletis Athens has been cutting back on smoking. He is cuting back and telling himself to wait a little longer until the next one. He is trying to quit but putting off smoking and fighting his urges. He is not interested in taking medications to help him quit.    Expected Outcomes Short: reduce tobacco use. Long: Be tobacco free.             ITP Comments:  ITP Comments     Row Name 09/28/22 1115 09/30/22 1516 10/05/22 1401 10/07/22 0825 11/04/22 0928   ITP Comments Virtual Visit completed. Patient informed on EP and RD appointment and 6 Minute walk test. Patient also informed of patient health questionnaires on My Chart. Patient Verbalizes understanding. Visit diagnosis can be found in [facility] media,patient is [place]. Completed and gym orientation. Initial ITP created and sent for review to Dr. Vida Rigger, Medical Director. First full day of exercise!  Patient was oriented to gym and equipment including functions, settings, policies, and procedures.  Patient's individual exercise prescription and treatment plan were reviewed.  All starting workloads were established based on the results of the 6 minute walk test done at initial orientation visit.  The plan for exercise progression was also introduced and progression will be  customized based on patient's performance and goals. 30 Day review completed. Medical Director ITP review done, changes made as directed, and signed approval by Medical Director.     new to program 30 day review completed. ITP sent to Dr. Jinny Sanders, Medical Director of  Pulmonary Rehab.  Continue with ITP unless changes are made by physician.    Row Name 11/23/22 9604 12/02/22 0839 12/30/22 0930 01/26/23 1426     ITP Comments Jon returned to rehab today after heart workup. 30 Day review completed. Medical Director ITP review done, changes made as directed, and signed approval by Medical Director. 30 Day review completed. Medical Director ITP review done, changes made as directed, and signed approval by Medical Director. 30 Day review completed. Medical Director ITP review done, changes made as directed, and signed approval by Medical Director.             Comments:

## 2023-01-29 ENCOUNTER — Encounter: Payer: No Typology Code available for payment source | Admitting: *Deleted

## 2023-01-29 DIAGNOSIS — R0609 Other forms of dyspnea: Secondary | ICD-10-CM

## 2023-01-29 NOTE — Progress Notes (Signed)
Daily Session Note  Patient Details  Name: Brian Mcclure MRN: [id number] Date of Birth: [birth year] Referring Provider:   Flowsheet Row Pulmonary Rehab from 09/30/2022 in [facility] Cardiac and Pulmonary Rehab  Referring Provider Vito Berger MD       Encounter Date: 01/29/2023  Check In:  Session Check In - 01/29/23 1149       Check-In   Supervising physician immediately available to respond to emergencies See telemetry face sheet for immediately available ER MD    Location [facility]-Cardiac & Pulmonary Rehab    Staff Present Cora Collum, RN, BSN, CCRP;Kristen Coble, RN,BC,MSN;Joseph [place], [place]    Virtual Visit No    Medication changes reported     No    Fall or balance concerns reported    No    Warm-up and Cool-down Performed on first and last piece of equipment    Resistance Training Performed Yes    VAD Patient? No    PAD/SET Patient? No      Pain Assessment   Currently in Pain? No/denies                Social History   Tobacco Use  Smoking Status Every Day   Current packs/day: 1.00   Average packs/day: 1 pack/day for 40.0 years (40.0 ttl pk-yrs)   Types: Cigarettes  Smokeless Tobacco Never    Goals Met:  Proper associated with RPD/PD & O2 Sat Independence with exercise equipment Exercise tolerated well No report of concerns or symptoms today  Goals Unmet:  Not Applicable  Comments: Pt able to follow exercise prescription today without complaint.  Will continue to monitor for progression.    Dr. Bethann Punches is Medical Director for [facility] Cardiac Rehabilitation.  Dr. Vida Rigger is Medical Director for [facility] Pulmonary Rehabilitation.

## 2023-02-01 ENCOUNTER — Encounter: Payer: No Typology Code available for payment source | Admitting: *Deleted

## 2023-02-01 DIAGNOSIS — R0609 Other forms of dyspnea: Secondary | ICD-10-CM

## 2023-02-01 NOTE — Progress Notes (Signed)
Daily Session Note  Patient Details  Name: Brian Mcclure MRN: [id number] Date of Birth: Jan 02, 1956 Referring Provider:   Flowsheet Row Pulmonary Rehab from 09/30/2022 in [facility] Cardiac and Pulmonary Rehab  Referring Provider Vito Berger MD       Encounter Date: 02/01/2023  Check In:  Session Check In - 02/01/23 1115       Check-In   Supervising physician immediately available to respond to emergencies See telemetry face sheet for immediately available ER MD    Location [facility]-Cardiac & Pulmonary Rehab    Staff Present Lanny Hurst, RN, ADN;Joseph Reino Kent, [place] Huxley, BS, ACSM CEP, Exercise Physiologist;Meredith Jewel Baize, RN BSN    Virtual Visit No    Medication changes reported     No    Fall or balance concerns reported    No    Tobacco Cessation No Change    Warm-up and Cool-down Performed on first and last piece of equipment    Resistance Training Performed Yes    VAD Patient? No    PAD/SET Patient? No      Pain Assessment   Currently in Pain? No/denies                Social History   Tobacco Use  Smoking Status Every Day   Current packs/day: 1.00   Average packs/day: 1 pack/day for 40.0 years (40.0 ttl pk-yrs)   Types: Cigarettes  Smokeless Tobacco Never    Goals Met:  Independence with exercise equipment Exercise tolerated well No report of concerns or symptoms today Strength training completed today  Goals Unmet:  Not Applicable  Comments: Pt able to follow exercise prescription today without complaint.  Will continue to monitor for progression.    Dr. Bethann Punches is Medical Director for [facility] Cardiac Rehabilitation.  Dr. Vida Rigger is Medical Director for [facility] Pulmonary Rehabilitation.

## 2023-02-12 ENCOUNTER — Encounter: Payer: No Typology Code available for payment source | Admitting: *Deleted

## 2023-02-12 DIAGNOSIS — R0609 Other forms of dyspnea: Secondary | ICD-10-CM | POA: Diagnosis not present

## 2023-02-12 NOTE — Progress Notes (Signed)
Daily Session Note  Patient Details  Name: Brian Mcclure MRN: [id number] Date of Birth: 07-11-1956 Referring Provider:   Flowsheet Row Pulmonary Rehab from 09/30/2022 in [facility] Cardiac and Pulmonary Rehab  Referring Provider Vito Berger MD       Encounter Date: 02/12/2023  Check In:  Session Check In - 02/12/23 1120       Check-In   Supervising physician immediately available to respond to emergencies See telemetry face sheet for immediately available ER MD    Location [facility]-Cardiac & Pulmonary Rehab    Staff Present Cyndia Diver, RN, BSN, MA;Susanne Bice, RN, BSN, CCRP;Joseph [place], RCP,RRT,BSRT    Virtual Visit No    Medication changes reported     No    Fall or balance concerns reported    No    Tobacco Cessation No Change    Warm-up and Cool-down Performed on first and last piece of equipment    Resistance Training Performed Yes    VAD Patient? No    PAD/SET Patient? No      Pain Assessment   Currently in Pain? No/denies                Social History   Tobacco Use  Smoking Status Every Day   Current packs/day: 1.00   Average packs/day: 1 pack/day for 40.0 years (40.0 ttl pk-yrs)   Types: Cigarettes  Smokeless Tobacco Never    Goals Met:  Independence with exercise equipment Exercise tolerated well No report of concerns or symptoms today  Goals Unmet:  Not Applicable  Comments: Pt able to follow exercise prescription today without complaint.  Will continue to monitor for progression.    Dr. Bethann Punches is Medical Director for [facility] Cardiac Rehabilitation.  Dr. Vida Rigger is Medical Director for [facility] Pulmonary Rehabilitation.

## 2023-02-15 ENCOUNTER — Encounter: Payer: No Typology Code available for payment source | Admitting: *Deleted

## 2023-02-15 VITALS — Ht 74.25 in | Wt 182.4 lb

## 2023-02-15 DIAGNOSIS — R0609 Other forms of dyspnea: Secondary | ICD-10-CM

## 2023-02-15 NOTE — Patient Instructions (Signed)
Discharge Patient Instructions  Patient Details  Name: Brian Mcclure MRN: [id number] Date of Birth: Nov 15, 1955 Referring Provider:  [facility]   Number of Visits: 36  Reason for Discharge:  Patient reached a stable level of exercise. Patient independent in their exercise. Patient has met program and personal goals.  Smoking History:  Social History   Tobacco Use  Smoking Status Every Day   Current packs/day: 1.00   Average packs/day: 1 pack/day for 40.0 years (40.0 ttl pk-yrs)   Types: Cigarettes  Smokeless Tobacco Never    Diagnosis:  Dyspnea on exertion  Initial Exercise Prescription:  Initial Exercise Prescription - 09/30/22 1500       Date of Initial Exercise RX and Referring Provider   Date 09/30/22    Referring Provider Vito Berger MD      Oxygen   Maintain Oxygen Saturation 88% or higher      Treadmill   MPH 3    Grade 1    Minutes 15    METs 3.71      Recumbant Bike   Level 3    Watts 57    Minutes 15    METs 4.24      NuStep   Level 4    Minutes 15    METs 4.24      Prescription Details   Frequency (times per week) 2    Duration Progress to 30 minutes of continuous aerobic without signs/symptoms of physical distress      Intensity   THRR 40-80% of Max Heartrate 92-133    Ratings of Perceived Exertion 11-13    Perceived Dyspnea 0-4      Progression   Progression Continue to progress workloads to maintain intensity without signs/symptoms of physical distress.      Resistance Training   Training Prescription Yes    Weight 4 lb    Reps 10-15             Discharge Exercise Prescription (Final Exercise Prescription Changes):  Exercise Prescription Changes - 02/05/23 0800       Response to Exercise   Blood Pressure (Admit) 102/58    Blood Pressure (Exit) 100/62    Heart Rate (Admit) 48 bpm    Heart Rate (Exercise) 99 bpm    Heart Rate (Exit) 65 bpm    Oxygen Saturation (Admit) 98 %    Oxygen Saturation  (Exercise) 95 %    Oxygen Saturation (Exit) 96 %    Rating of Perceived Exertion (Exercise) 14    Perceived Dyspnea (Exercise) 0    Symptoms none    Duration Continue with 30 min of aerobic exercise without signs/symptoms of physical distress.    Intensity THRR unchanged      Progression   Progression Continue to progress workloads to maintain intensity without signs/symptoms of physical distress.    Average METs 7.9      Resistance Training   Training Prescription Yes    Weight 8 lb    Reps 10-15      Interval Training   Interval Training No      Treadmill   MPH 3.4    Grade 11    Minutes 30    METs 8.76      Home Exercise Plan   Plans to continue exercise at Home (comment)   walking  working in garden   Frequency Add 2 additional days to program exercise sessions.    Initial Home Exercises Provided 10/30/22  Oxygen   Maintain Oxygen Saturation 88% or higher             Functional Capacity:  6 Minute Walk     Row Name 09/30/22 1537 02/15/23 1124       6 Minute Walk   Phase Initial Discharge    Distance 1730 feet 2025 feet    Distance % Change -- 17.1 %    Distance Feet Change -- 295 ft    Walk Time 6 minutes 6 minutes    # of Rest Breaks 0 0    MPH 3.28 3.84    METS 4.24 4.87    RPE 13 15    Perceived Dyspnea  0 1    VO2 Peak 14.84 17.05    Symptoms Yes (comment) Yes (comment)    Comments leg tightness calf cramps    Resting HR 51 bpm 49 bpm    Resting BP 98/52 120/70    Resting Oxygen Saturation  97 % 97 %    Exercise Oxygen Saturation  during 6 min walk 98 % 98 %    Max Ex. HR 84 bpm 100 bpm    Max Ex. BP 142/60 142/62    2 Minute Post BP 96/58 124/62      Interval HR   1 Minute HR 65 78    2 Minute HR 78 91    3 Minute HR 77 93    4 Minute HR 82 93    5 Minute HR 82 96    6 Minute HR 84 100    2 Minute Post HR 54 65    Interval Heart Rate? Yes Yes      Interval Oxygen   Interval Oxygen? Yes Yes    Baseline Oxygen Saturation % 97  % 97 %    1 Minute Oxygen Saturation % 97 % 98 %    1 Minute Liters of Oxygen 0 L  RA 0 L    2 Minute Oxygen Saturation % 97 % 98 %    2 Minute Liters of Oxygen 0 L  RA 0 L    3 Minute Oxygen Saturation % 97 % 98 %    3 Minute Liters of Oxygen 0 L  RA 0 L    4 Minute Oxygen Saturation % 98 % 98 %    4 Minute Liters of Oxygen 0 L  RA 0 L    5 Minute Oxygen Saturation % 98 % 98 %    5 Minute Liters of Oxygen 0 L  RA 0 L    6 Minute Oxygen Saturation % 97 % 98 %    6 Minute Liters of Oxygen 0 L  RA 0 L    2 Minute Post Oxygen Saturation % 98 % 99 %    2 Minute Post Liters of Oxygen 0 L  RA 0 L             Nutrition & Weight - Outcomes:  Pre Biometrics - 09/30/22 1542       Pre Biometrics   Height 6' 2.25" (1.886 m)    Weight 176 lb 14.4 oz (80.2 kg)    Waist Circumference 34 inches    Hip Circumference 40 inches    Waist to Hip Ratio 0.85 %    BMI (Calculated) 22.56    Single Leg Stand 30 seconds             Post Biometrics - 02/15/23 1130  Post  Biometrics   Height 6' 2.25" (1.886 m)    Weight 182 lb 6.4 oz (82.7 kg)    Waist Circumference 36 inches    Hip Circumference 40 inches    Waist to Hip Ratio 0.9 %    BMI (Calculated) 23.26    Single Leg Stand 30 seconds             Nutrition:  Nutrition Therapy & Goals - 09/30/22 1520       Intervention Plan   Intervention Prescribe, educate and counsel regarding individualized specific dietary modifications aiming towards targeted core components such as weight, hypertension, lipid management, diabetes, heart failure and other comorbidities.    Expected Outcomes Short Term Goal: Understand basic principles of dietary content, such as calories, fat, sodium, cholesterol and nutrients.;Short Term Goal: A plan has been developed with personal nutrition goals set during dietitian appointment.;Long Term Goal: Adherence to prescribed nutrition plan.            Goals reviewed with patient; copy given to  patient.

## 2023-02-15 NOTE — Progress Notes (Signed)
Daily Session Note  Patient Details  Name: Brian Mcclure MRN: [id number] Date of Birth: 1955-10-10 Referring Provider:   Flowsheet Row Pulmonary Rehab from 09/30/2022 in [facility] Cardiac and Pulmonary Rehab  Referring Provider Vito Berger MD       Encounter Date: 02/15/2023  Check In:  Session Check In - 02/15/23 1126       Check-In   Supervising physician immediately available to respond to emergencies See telemetry face sheet for immediately available ER MD    Location [facility]-Cardiac & Pulmonary Rehab    Staff Present Lanny Hurst, RN, ADN;Joseph Hood, RCP,RRT,BSRT;Kelly Sheffield, BS, ACSM CEP, Exercise Physiologist    Virtual Visit No    Medication changes reported     No    Fall or balance concerns reported    No    Tobacco Cessation No Change    Warm-up and Cool-down Performed on first and last piece of equipment    Resistance Training Performed Yes    VAD Patient? No    PAD/SET Patient? No      Pain Assessment   Currently in Pain? No/denies                Social History   Tobacco Use  Smoking Status Every Day   Current packs/day: 1.00   Average packs/day: 1 pack/day for 40.0 years (40.0 ttl pk-yrs)   Types: Cigarettes  Smokeless Tobacco Never    Goals Met:  Independence with exercise equipment Exercise tolerated well No report of concerns or symptoms today Strength training completed today  Goals Unmet:  Not Applicable  Comments: Pt able to follow exercise prescription today without complaint.  Will continue to monitor for progression.   6 Minute Walk     Row Name 09/30/22 1537 02/15/23 1124       6 Minute Walk   Phase Initial Discharge    Distance 1730 feet 2025 feet    Distance % Change -- 17.1 %    Distance Feet Change -- 295 ft    Walk Time 6 minutes 6 minutes    # of Rest Breaks 0 0    MPH 3.28 3.84    METS 4.24 4.87    RPE 13 15    Perceived Dyspnea  0 1    VO2 Peak 14.84 17.05    Symptoms Yes (comment) Yes (comment)     Comments leg tightness calf cramps    Resting HR 51 bpm 49 bpm    Resting BP 98/52 120/70    Resting Oxygen Saturation  97 % 97 %    Exercise Oxygen Saturation  during 6 min walk 98 % 98 %    Max Ex. HR 84 bpm 100 bpm    Max Ex. BP 142/60 142/62    2 Minute Post BP 96/58 124/62      Interval HR   1 Minute HR 65 78    2 Minute HR 78 91    3 Minute HR 77 93    4 Minute HR 82 93    5 Minute HR 82 96    6 Minute HR 84 100    2 Minute Post HR 54 65    Interval Heart Rate? Yes Yes      Interval Oxygen   Interval Oxygen? Yes Yes    Baseline Oxygen Saturation % 97 % 97 %    1 Minute Oxygen Saturation % 97 % 98 %    1 Minute Liters of Oxygen 0 L  RA 0 L    2 Minute Oxygen Saturation % 97 % 98 %    2 Minute Liters of Oxygen 0 L  RA 0 L    3 Minute Oxygen Saturation % 97 % 98 %    3 Minute Liters of Oxygen 0 L  RA 0 L    4 Minute Oxygen Saturation % 98 % 98 %    4 Minute Liters of Oxygen 0 L  RA 0 L    5 Minute Oxygen Saturation % 98 % 98 %    5 Minute Liters of Oxygen 0 L  RA 0 L    6 Minute Oxygen Saturation % 97 % 98 %    6 Minute Liters of Oxygen 0 L  RA 0 L    2 Minute Post Oxygen Saturation % 98 % 99 %    2 Minute Post Liters of Oxygen 0 L  RA 0 L                Dr. Bethann Punches is Medical Director for [facility] Cardiac Rehabilitation.  Dr. Vida Rigger is Medical Director for [facility] Pulmonary Rehabilitation.

## 2023-02-18 ENCOUNTER — Encounter: Payer: Self-pay | Admitting: Cardiovascular Disease

## 2023-02-18 ENCOUNTER — Ambulatory Visit: Payer: Medicare PPO | Attending: Cardiovascular Disease | Admitting: Cardiovascular Disease

## 2023-02-18 VITALS — BP 126/66 | HR 49 | Ht 75.0 in | Wt 182.8 lb

## 2023-02-18 DIAGNOSIS — I739 Peripheral vascular disease, unspecified: Secondary | ICD-10-CM | POA: Diagnosis not present

## 2023-02-18 DIAGNOSIS — I480 Paroxysmal atrial fibrillation: Secondary | ICD-10-CM | POA: Diagnosis not present

## 2023-02-18 DIAGNOSIS — I2510 Atherosclerotic heart disease of native coronary artery without angina pectoris: Secondary | ICD-10-CM | POA: Diagnosis not present

## 2023-02-18 DIAGNOSIS — E785 Hyperlipidemia, unspecified: Secondary | ICD-10-CM

## 2023-02-18 DIAGNOSIS — I5022 Chronic systolic (congestive) heart failure: Secondary | ICD-10-CM

## 2023-02-18 DIAGNOSIS — Z720 Tobacco use: Secondary | ICD-10-CM

## 2023-02-18 NOTE — Patient Instructions (Addendum)
Medication Instructions:  Your physician has recommended you make the following change in your medication:   STOP TAKING ASPIRIN  *If you need a refill on your cardiac medications before your next appointment, please call your pharmacy*   Lab Work:  NO LAB WORK ORDERED  If you have labs (blood work) drawn today and your tests are completely normal, you will receive your results only by: MyChart Message (if you have MyChart) OR A paper copy in the mail If you have any lab test that is abnormal or we need to change your treatment, we will call you to review the results.   Testing/Procedures:  NONE   Follow-Up: At [facility], you and your health needs are our priority.  As part of our continuing mission to provide you with exceptional heart care, we have created designated Provider Care Teams.  These Care Teams include your primary Cardiologist (physician) and Advanced Practice Providers (APPs -  Physician Assistants and Nurse Practitioners) who all work together to provide you with the care you need, when you need it.  We recommend signing up for the patient portal called "MyChart".  Sign up information is provided on this After Visit Summary.  MyChart is used to connect with patients for Virtual Visits (Telemedicine).  Patients are able to view lab/test results, encounter notes, upcoming appointments, etc.  Non-urgent messages can be sent to your provider as well.   To learn more about what you can do with MyChart, go to [email or web address].    Your next appointment:   1 year(s)  Provider:   You may see Dr. [name] or one of the following Advanced Practice Providers on your designated Care Team:    Nicolasa Ducking, NP Eula Listen, PA-C Cadence Fransico Michael, PA-C Charlsie Quest, NP    Managing the Challenge of Quitting Smoking Quitting smoking is a physical and mental challenge. You may have cravings, withdrawal symptoms, and temptation to smoke. Before quitting, work with  your health care provider to make a plan that can help you manage quitting. Making a plan before you quit may keep you from smoking when you have the urge to smoke while trying to quit. How to manage lifestyle changes Managing stress Stress can make you want to smoke, and wanting to smoke may cause stress. It is important to find ways to manage your stress. You could try some of the following: Practice relaxation techniques. Breathe slowly and deeply, in through your nose and out through your mouth. Listen to music. Soak in a bath or take a shower. Imagine a peaceful place or vacation. Get some support. Talk with family or friends about your stress. Join a support group. Talk with a counselor or therapist. Get some physical activity. Go for a walk, run, or bike ride. Play a favorite sport. Practice yoga.  Medicines Talk with your health care provider about medicines that might help you deal with cravings and make quitting easier for you. Relationships Social situations can be difficult when you are quitting smoking. To manage this, you can: Avoid parties and other social situations where people might be smoking. Avoid alcohol. Leave right away if you have the urge to smoke. Explain to your family and friends that you are quitting smoking. Ask for support and let them know you might be a bit grumpy. Plan activities where smoking is not an option. General instructions Be aware that many people gain weight after they quit smoking. However, not everyone does. To keep from gaining weight, have a  plan in place before you quit, and stick to the plan after you quit. Your plan should include: Eating healthy snacks. When you have a craving, it may help to: Eat popcorn, or try carrots, celery, or other cut vegetables. Chew sugar-free gum. Changing how you eat. Eat small portion sizes at meals. Eat 4-6 small meals throughout the day instead of 1-2 large meals a day. Be mindful when you eat.  You should avoid watching television or doing other things that might distract you as you eat. Exercising regularly. Make time to exercise each day. If you do not have time for a long workout, do short bouts of exercise for 5-10 minutes several times a day. Do some form of strengthening exercise, such as weight lifting. Do some exercise that gets your heart beating and causes you to breathe deeply, such as walking fast, running, swimming, or biking. This is very important. Drinking plenty of water or other low-calorie or no-calorie drinks. Drink enough fluid to keep your urine pale yellow.  How to recognize withdrawal symptoms Your body and mind may experience discomfort as you try to get used to not having nicotine in your system. These effects are called withdrawal symptoms. They may include: Feeling hungrier than normal. Having trouble concentrating. Feeling irritable or restless. Having trouble sleeping. Feeling depressed. Craving a cigarette. These symptoms may surprise you, but they are normal to have when quitting smoking. To manage withdrawal symptoms: Avoid places, people, and activities that trigger your cravings. Remember why you want to quit. Get plenty of sleep. Avoid coffee and other drinks that contain caffeine. These may worsen some of your symptoms. How to manage cravings Come up with a plan for how to deal with your cravings. The plan should include the following: A definition of the specific situation you want to deal with. An activity or action you will take to replace smoking. A clear idea for how this action will help. The name of someone who could help you with this. Cravings usually last for 5-10 minutes. Consider taking the following actions to help you with your plan to deal with cravings: Keep your mouth busy. Chew sugar-free gum. Suck on hard candies or a straw. Brush your teeth. Keep your hands and body busy. Change to a different activity right  away. Squeeze or play with a ball. Do an activity or a hobby, such as making bead jewelry, practicing needlepoint, or working with wood. Mix up your normal routine. Take a short exercise break. Go for a quick walk, or run up and down stairs. Focus on doing something kind or helpful for someone else. Call a friend or family member to talk during a craving. Join a support group. Contact a quitline. Where to find support To get help or find a support group: Call the National Cancer Institute's Smoking Quitline: 1-800-QUIT-NOW (843)048-0747) Text QUIT to SmokefreeTXT: 657846 Where to find more information Visit these websites to find more information on quitting smoking: U.S. Department of Health and Human Services: www.smokefree.gov American Lung Association: www.freedomfromsmoking.org Centers for Disease Control and Prevention (CDC): FootballExhibition.com.br American Heart Association: www.heart.org Contact a health care provider if: You want to change your plan for quitting. The medicines you are taking are not helping. Your eating feels out of control or you cannot sleep. You feel depressed or become very anxious. Summary Quitting smoking is a physical and mental challenge. You will face cravings, withdrawal symptoms, and temptation to smoke again. Preparation can help you as you go through these challenges.  Try different techniques to manage stress, handle social situations, and prevent weight gain. You can deal with cravings by keeping your mouth busy (such as by chewing gum), keeping your hands and body busy, calling family or friends, or contacting a quitline for people who want to quit smoking. You can deal with withdrawal symptoms by avoiding places where people smoke, getting plenty of rest, and avoiding drinks that contain caffeine. This information is not intended to replace advice given to you by your health care provider. Make sure you discuss any questions you have with your health care  provider. Document Revised: 06/27/2021 Document Reviewed: 06/27/2021 Elsevier Patient Education  2024 [employer].

## 2023-02-18 NOTE — Progress Notes (Signed)
Cardiology Office Note   Date:  02/18/2023   ID:  Brian, Mcclure 1955-09-29, MRN [id number]  PCP[facility]  [facility]  Cardiologist:  Dr. Okey Dupre  Chief Complaint  Patient presents with   Claudication in peripheral vascular disease    Patient is doing well on today. Meds reviewed.       History of Present Illness: Brian Mcclure is a 67 y.o. male who was referred by Dr. Okey Dupre for evaluation and management of peripheral arterial disease. He has known history of coronary artery disease status post anterior MI and LAD stenting in August 2020, chronic systolic heart failure due to ischemic cardiomyopathy with apical thrombus, paroxysmal atrial fibrillation, hyperlipidemia, COPD and tobacco use.  The patient had prior lifestyle limiting claudication and was treated at the [place] in [place] with an aortic stent extending into bilateral common iliac arteries.  His symptoms improved significantly since that time.  He goes to pulmonary rehab and walks on a treadmill.  He started having calf claudication at minute 8 but he continues to work through it and the symptoms just appear by minute 18.  He is able to continue walking for 45 minutes.  He had lower extremity arterial Doppler studies in May which showed normal ABI and toe pressures bilaterally.  Duplex showed no significant infrainguinal disease.  Iliac stents were patent but there was evidence of restenosis on the left side with peak velocity of 339 in the mid stent.  He denies chest pain or worsening dyspnea.  Unfortunately, he continues to smoke close to 1 pack/day.  Past Medical History:  Diagnosis Date   Actinic keratosis    Acute ST elevation myocardial infarction (STEMI) of anterior wall (HCC)    a. 02/2019 s/p PCI/DES ->LAD.   Apical mural thrombus following MI (HCC)    a. 02/2019 s/p Ant MI-->Echo: Small, fixed thrombus on the apical wall of LV-->coumadin.   Barrett's esophagus    CAD (coronary artery disease)    a.  02/2019 Ant STEMI/PCI: LM nl, LAD 100p (4x18 Resolute Onyx DES), LCX/RCA min irregs, EF 35-45%; b. 10/2022 MV: EF 38% (nl by echo). No isch/infarct.   Chronic back pain    Colon polyps    a. 07/2017 Colonoscopy: 5mm sigmoid polyp, tubular adenoma, diverticulosis, int hemorrhoids.   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 02/2019 Echo: EF 40-45%; b. 06/2019 Echo: EF 50-55%; c. 10/2022 Echo: EF 55-60%, no rwma, nl RV fxn, mild MR.   Hydrocele    Hyperlipidemia    Ischemic cardiomyopathy    a. 02/2019 Echo: EF 40-45%; b. 06/2019 Echo: EF 50-55%; c. 10/2022 Echo: EF 55-60%.   Myalgia    Osteoarthritis    C-spine   PAF (paroxysmal atrial fibrillation) (HCC)    a. CHA2DS2VASc = 3-->eliquis   Peripheral vascular disease (HCC)    a. 2018 s/p aorto-iliac stent graft; b. 11/2022 ABI/Duplex: R 1.24, L 1.02. Bilateral inflow dzs noted on duplex.   Reactive airway disease    Seborrheic keratosis    Sinus bradycardia    a. 02/2019 Jxnl rhythm and sinus brady in setting of Ant MI-->[facility] d/c'd.   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   Tobacco use disorder    Tremor     Past  Surgical History:  Procedure Laterality Date   [facility]   [facility]     COLONOSCOPY WITH PROPOFOL N/A 08/05/2017   Procedure: COLONOSCOPY WITH PROPOFOL;  Surgeon: Pasty Spillers, MD;  Location: [facility] ENDOSCOPY;  Service: Endoscopy;  Laterality: N/A;   CORONARY/GRAFT ACUTE MI REVASCULARIZATION N/A 03/12/2019   Procedure: Coronary/Graft Acute MI Revascularization;  Surgeon: Swaziland, Peter M, MD;  [facility] INVASIVE CV LAB;  Service: Cardiovascular;  Laterality: N/A;   ESOPHAGOGASTRODUODENOSCOPY (EGD) WITH PROPOFOL N/A 08/05/2017   Procedure: ESOPHAGOGASTRODUODENOSCOPY (EGD) WITH PROPOFOL;  Surgeon:  Pasty Spillers, MD;  Location: [facility] ENDOSCOPY;  Service: Endoscopy;  Laterality: N/A;   LEFT HEART CATH AND CORONARY ANGIOGRAPHY N/A 03/12/2019   Procedure: LEFT HEART CATH AND CORONARY ANGIOGRAPHY;  Surgeon: Swaziland, Peter M, MD;  Location: [facility] INVASIVE CV LAB;  Service: Cardiovascular;  Laterality: N/A;   NASAL SINUS SURGERY     several   SPLENECTOMY  01/2017     Current Outpatient Medications  Medication Sig Dispense Refill   albuterol (VENTOLIN HFA) 108 (90 Base) MCG/ACT inhaler      apixaban (ELIQUIS) 5 MG TABS tablet TAKE ONE TABLET BY MOUTH EVERY 12 HOURS CAUTION BLOOD THINNER     Ca Carbonate-Mag Hydroxide (ROLAIDS PO) Take 1 tablet by mouth at bedtime as needed (heartburn/acid reflux).     cholecalciferol (VITAMIN D) 25 MCG (1000 UT) tablet Take 1,000 Units by mouth 2 (two) times daily.     diclofenac sodium (VOLTAREN) 1 % GEL Apply 1 application  topically daily as needed (pain).     ezetimibe (ZETIA) 10 MG tablet Take 1 tablet (10 mg total) by mouth daily. 90 tablet 3   famotidine (PEPCID) 20 MG tablet Take 20 mg by mouth daily.     hydrocortisone cream 1 % SMARTSIG:1 Topical Daily     levofloxacin (LEVAQUIN) 750 MG tablet TAKE ONE TABLET BY MOUTH ONCE FOR BACTEREMIA . DO NOT TAKE WITH DAIRY PRODUCTS;ANTACID;SUCRALFATE;IRON;ZINC. TAKE FOR FEVER MORE THAN 100.5 WHILE ON YOUR WAY TO CLOSEST EMERGENCY DEPARTMENT.     loratadine (CLARITIN) 10 MG tablet TAKE ONE TABLET BY MOUTH EVERY DAY FOR ALLERGIES     losartan (COZAAR) 25 MG tablet Take 0.5 tablets (12.5 mg total) by mouth daily. 15 tablet 1   metoprolol succinate (TOPROL XL) 25 MG 24 hr tablet Take 0.5 tablets (12.5 mg total) by mouth daily. 45 tablet 3   nitroGLYCERIN (NITROSTAT) 0.4 MG SL tablet Place 1 tablet (0.4 mg total) under the tongue every 5 (five) minutes x 3 doses as needed for chest pain. 25 tablet 12   oxymetazoline (AFRIN) 0.05 % nasal spray Place 1 spray into both nostrils 2 (two) times daily as needed for  congestion.     Simethicone (GAS-X PO) Take 1 tablet by mouth 3 (three) times daily as needed (heartburn).     simethicone (MYLICON) 80 MG chewable tablet CHEW ONE TABLET BY MOUTH TWO TIMES A DAY     STIOLTO RESPIMAT 2.5-2.5 MCG/ACT AERS INHALE 2 INHALATIONS BY ORAL INHALATION IN THE MORNING FOR CHRONIC OBSTRUCTIVE LUNGS     rosuvastatin (CRESTOR) 20 MG tablet TAKE ONE TABLET BY MOUTH AT BEDTIME FOR CHOLESTEROL (Patient not taking: Reported on 01/01/2023)     No current facility-administered medications for this visit.    Allergies:   Statins, Amoxicillin-pot clavulanate, Atorvastatin, Atorvastatin calcium, Clavulanic acid, Codeine, Menthol, Nicotine polacrilex, Pravastatin, Bupropion, Flunisolide, and Nicotine    Social History:  The patient  reports that he  has been smoking cigarettes. He has a 40 pack-year smoking history. He has never used smokeless tobacco. He reports current alcohol use. He reports that he does not use drugs.   Family History:  The patient's family history includes Arrhythmia in his mother; Diabetes in his brother.    ROS:  Please see the history of present illness.   Otherwise, review of systems are positive for none.   All other systems are reviewed and negative.    PHYSICAL EXAM: VS:  BP 126/66 (BP Location: Left Arm, Patient Position: Sitting, Cuff Size: Normal)   Pulse (!) 49   Ht 6\' 3"  (1.905 m)   Wt 182 lb 12.8 oz (82.9 kg)   SpO2 98%   BMI 22.85 kg/m  , BMI Body mass index is 22.85 kg/m. GEN: Well nourished, well developed, in no acute distress  HEENT: normal  Neck: no JVD, carotid bruits, or masses Cardiac: RRR; no murmurs, rubs, or gallops,no edema  Respiratory:  clear to auscultation bilaterally, normal work of breathing GI: soft, nontender, nondistended, + BS MS: no deformity or atrophy  Skin: warm and dry, no rash Neuro:  Strength and sensation are intact Psych: euthymic mood, full affect Vascular: Femoral pulses normal bilaterally.  Posterior  tibial pulses normal bilaterally.   EKG:  EKG is not ordered today.    Recent Labs: No results found for requested labs within last 365 days.    Lipid Panel    Component Value Date/Time   CHOL 96 (L) 05/03/2019 1433   TRIG 79 05/03/2019 1433   HDL 34 (L) 05/03/2019 1433   CHOLHDL 2.8 05/03/2019 1433   CHOLHDL 3.4 03/12/2019 0906   VLDL 14 03/12/2019 0906   LDLCALC 46 05/03/2019 1433      Wt Readings from Last 3 Encounters:  02/18/23 182 lb 12.8 oz (82.9 kg)  02/15/23 182 lb 6.4 oz (82.7 kg)  12/04/22 180 lb (81.6 kg)         10/28/2022    9:46 AM  PAD Screen  Previous PAD dx? Yes  Previous surgical procedure? Yes  Dates of procedures iliac artery  Pain with walking? Yes  Feet/toe relief with dangling? No  Painful, non-healing ulcers? No  Extremities discolored? No      ASSESSMENT AND PLAN:  1.  Peripheral arterial disease: Previous aortoiliac stenting.  Currently with mild bilateral calf claudication that is not lifestyle limiting.  His ABI remains normal and there is evidence of borderline restenosis especially on the left side.  His pulses are normal by exam.  No indication for revascularization at this time.  I recommend continuing medical therapy and a walking program.  I agree with stopping aspirin given that he is on Eliquis.  2.  Coronary artery disease involving native coronary arteries without angina: He has no anginal symptoms and seems to be doing well.  3.  Chronic heart failure with improved ejection fraction: No evidence of volume overload.  4.  Paroxysmal atrial fibrillation: On long-term anticoagulation with Eliquis.  5.  Hyperlipidemia: Rosuvastatin is currently on hold due to myalgia but suspect that his symptoms are related to claudication.  6.  Tobacco use: I discussed with him the importance of smoking cessation.    Disposition:   FU with me in 1 year  Signed,  Lorine Bears, MD  02/18/2023 12:12 PM    [facility]  [facility]

## 2023-02-19 ENCOUNTER — Encounter: Payer: No Typology Code available for payment source | Attending: Registered Nurse | Admitting: *Deleted

## 2023-02-19 DIAGNOSIS — R0609 Other forms of dyspnea: Secondary | ICD-10-CM | POA: Insufficient documentation

## 2023-02-19 DIAGNOSIS — J449 Chronic obstructive pulmonary disease, unspecified: Secondary | ICD-10-CM | POA: Insufficient documentation

## 2023-02-19 NOTE — Progress Notes (Signed)
Daily Session Note  Patient Details  Name: LARRIE FRAIZER MRN: [id number] Date of Birth: 1955-10-20 Referring Provider:   Flowsheet Row Pulmonary Rehab from 09/30/2022 in [facility] Cardiac and Pulmonary Rehab  Referring Provider Vito Berger MD       Encounter Date: 02/19/2023  Check In:  Session Check In - 02/19/23 1152       Check-In   Supervising physician immediately available to respond to emergencies See telemetry face sheet for immediately available ER MD    Location [facility]-Cardiac & Pulmonary Rehab    Staff Present Cora Collum, RN, BSN, CCRP;Joseph Hood, RCP,RRT,BSRT;Noah Tickle, [place], Exercise Physiologist    Virtual Visit No    Medication changes reported     Yes    Comments stopped all inhalers and ASA    Fall or balance concerns reported    No    Warm-up and Cool-down Performed on first and last piece of equipment    Resistance Training Performed Yes    VAD Patient? No    PAD/SET Patient? No      Pain Assessment   Currently in Pain? No/denies                Social History   Tobacco Use  Smoking Status Every Day   Current packs/day: 1.00   Average packs/day: 1 pack/day for 40.0 years (40.0 ttl pk-yrs)   Types: Cigarettes  Smokeless Tobacco Never    Goals Met:  Proper associated with RPD/PD & O2 Sat Independence with exercise equipment Exercise tolerated well Strength training completed today  Goals Unmet:  Not Applicable  Comments: Pt able to follow exercise prescription today without complaint.  Will continue to monitor for progression.    Dr. Bethann Punches is Medical Director for [facility] Cardiac Rehabilitation.  Dr. Vida Rigger is Medical Director for [facility] Pulmonary Rehabilitation.

## 2023-02-22 ENCOUNTER — Encounter: Payer: No Typology Code available for payment source | Admitting: *Deleted

## 2023-02-22 DIAGNOSIS — R0609 Other forms of dyspnea: Secondary | ICD-10-CM

## 2023-02-22 NOTE — Progress Notes (Signed)
Daily Session Note  Patient Details  Name: Brian Mcclure MRN: [id number] Date of Birth: [birth year] Referring Provider:   Flowsheet Row Pulmonary Rehab from 09/30/2022 in [facility] Cardiac and Pulmonary Rehab  Referring Provider Vito Berger MD       Encounter Date: 02/22/2023  Check In:  Session Check In - 02/22/23 1123       Check-In   Supervising physician immediately available to respond to emergencies See telemetry face sheet for immediately available ER MD    Location [facility]-Cardiac & Pulmonary Rehab    Staff Present Lanny Hurst, RN, ADN;Meredith Jewel Baize, RN Mabeline Caras, BS, ACSM CEP, Exercise Physiologist;Other   Girtha Rm, MS   Virtual Visit No    Medication changes reported     No    Fall or balance concerns reported    No    Tobacco Cessation No Change    Warm-up and Cool-down Performed on first and last piece of equipment    Resistance Training Performed Yes    VAD Patient? No    PAD/SET Patient? No      Pain Assessment   Currently in Pain? No/denies                Social History   Tobacco Use  Smoking Status Every Day   Current packs/day: 1.00   Average packs/day: 1 pack/day for 40.0 years (40.0 ttl pk-yrs)   Types: Cigarettes  Smokeless Tobacco Never    Goals Met:  Independence with exercise equipment Exercise tolerated well No report of concerns or symptoms today Strength training completed today  Goals Unmet:  Not Applicable  Comments: Pt able to follow exercise prescription today without complaint.  Will continue to monitor for progression.    Dr. Bethann Punches is Medical Director for [facility] Cardiac Rehabilitation.  Dr. Vida Rigger is Medical Director for [facility] Pulmonary Rehabilitation.

## 2023-02-24 ENCOUNTER — Encounter: Payer: Self-pay | Admitting: *Deleted

## 2023-02-24 DIAGNOSIS — R0609 Other forms of dyspnea: Secondary | ICD-10-CM

## 2023-02-24 NOTE — Progress Notes (Signed)
Pulmonary Individual Treatment Plan  Patient Details  Name: Brian Mcclure MRN: [id number] Date of Birth: 17-Apr-1956 Referring Provider:   Flowsheet Row Pulmonary Rehab from 09/30/2022 in [facility] Cardiac and Pulmonary Rehab  Referring Provider Vito Berger MD       Initial Encounter Date:  Flowsheet Row Pulmonary Rehab from 09/30/2022 in [facility] Cardiac and Pulmonary Rehab  Date 09/30/22       Visit Diagnosis: Dyspnea on exertion  Patient's Home Medications on Admission:  Current Outpatient Medications:    albuterol (VENTOLIN HFA) 108 (90 Base) MCG/ACT inhaler, , Disp: , Rfl:    apixaban (ELIQUIS) 5 MG TABS tablet, TAKE ONE TABLET BY MOUTH EVERY 12 HOURS CAUTION BLOOD THINNER, Disp: , Rfl:    Ca Carbonate-Mag Hydroxide (ROLAIDS PO), Take 1 tablet by mouth at bedtime as needed (heartburn/acid reflux)., Disp: , Rfl:    cholecalciferol (VITAMIN D) 25 MCG (1000 UT) tablet, Take 1,000 Units by mouth 2 (two) times daily., Disp: , Rfl:    diclofenac sodium (VOLTAREN) 1 % GEL, Apply 1 application  topically daily as needed (pain)., Disp: , Rfl:    ezetimibe (ZETIA) 10 MG tablet, Take 1 tablet (10 mg total) by mouth daily., Disp: 90 tablet, Rfl: 3   famotidine (PEPCID) 20 MG tablet, Take 20 mg by mouth daily., Disp: , Rfl:    hydrocortisone cream 1 %, SMARTSIG:1 Topical Daily, Disp: , Rfl:    levofloxacin (LEVAQUIN) 750 MG tablet, TAKE ONE TABLET BY MOUTH ONCE FOR BACTEREMIA . DO NOT TAKE WITH DAIRY PRODUCTS;ANTACID;SUCRALFATE;IRON;ZINC. TAKE FOR FEVER MORE THAN 100.5 WHILE ON YOUR WAY TO CLOSEST EMERGENCY DEPARTMENT., Disp: , Rfl:    loratadine (CLARITIN) 10 MG tablet, TAKE ONE TABLET BY MOUTH EVERY DAY FOR ALLERGIES, Disp: , Rfl:    losartan (COZAAR) 25 MG tablet, Take 0.5 tablets (12.5 mg total) by mouth daily., Disp: 15 tablet, Rfl: 1   metoprolol succinate (TOPROL XL) 25 MG 24 hr tablet, Take 0.5 tablets (12.5 mg total) by mouth daily., Disp: 45 tablet, Rfl: 3   nitroGLYCERIN  (NITROSTAT) 0.4 MG SL tablet, Place 1 tablet (0.4 mg total) under the tongue every 5 (five) minutes x 3 doses as needed for chest pain., Disp: 25 tablet, Rfl: 12   oxymetazoline (AFRIN) 0.05 % nasal spray, Place 1 spray into both nostrils 2 (two) times daily as needed for congestion., Disp: , Rfl:    rosuvastatin (CRESTOR) 20 MG tablet, TAKE ONE TABLET BY MOUTH AT BEDTIME FOR CHOLESTEROL (Patient not taking: Reported on 01/01/2023), Disp: , Rfl:    Simethicone (GAS-X PO), Take 1 tablet by mouth 3 (three) times daily as needed (heartburn)., Disp: , Rfl:    simethicone (MYLICON) 80 MG chewable tablet, CHEW ONE TABLET BY MOUTH TWO TIMES A DAY, Disp: , Rfl:    STIOLTO RESPIMAT 2.5-2.5 MCG/ACT AERS, INHALE 2 INHALATIONS BY ORAL INHALATION IN THE MORNING FOR CHRONIC OBSTRUCTIVE LUNGS (Patient not taking: Reported on 02/19/2023), Disp: , Rfl:   Past Medical History: Past Medical History:  Diagnosis Date   Actinic keratosis    Acute ST elevation myocardial infarction (STEMI) of anterior wall (HCC)    a. 02/2019 s/p PCI/DES ->LAD.   Apical mural thrombus following MI (HCC)    a. 02/2019 s/p Ant MI-->Echo: Small, fixed thrombus on the apical wall of LV-->coumadin.   Barrett's esophagus    CAD (coronary artery disease)    a. 02/2019 Ant STEMI/PCI: LM nl, LAD 100p (4x18 Resolute Onyx DES), LCX/RCA min irregs, EF 35-45%; b. 10/2022  MV: EF 38% (nl by echo). No isch/infarct.   Chronic back pain    Colon polyps    a. 07/2017 Colonoscopy: 5mm sigmoid polyp, tubular adenoma, diverticulosis, int hemorrhoids.   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 02/2019 Echo: EF 40-45%; b. 06/2019 Echo: EF 50-55%; c. 10/2022 Echo: EF 55-60%, no rwma, nl RV fxn, mild MR.   Hydrocele    Hyperlipidemia    Ischemic cardiomyopathy    a. 02/2019 Echo:  EF 40-45%; b. 06/2019 Echo: EF 50-55%; c. 10/2022 Echo: EF 55-60%.   Myalgia    Osteoarthritis    C-spine   PAF (paroxysmal atrial fibrillation) (HCC)    a. CHA2DS2VASc = 3-->eliquis   Peripheral vascular disease (HCC)    a. 2018 s/p aorto-iliac stent graft; b. 11/2022 ABI/Duplex: R 1.24, L 1.02. Bilateral inflow dzs noted on duplex.   Reactive airway disease    Seborrheic keratosis    Sinus bradycardia    a. 02/2019 Jxnl rhythm and sinus brady in setting of Ant MI-->[facility] d/c'd.   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   Tobacco use disorder    Tremor     Tobacco Use: Social History   Tobacco Use  Smoking Status Every Day   Current packs/day: 1.00   Average packs/day: 1 pack/day for 40.0 years (40.0 ttl pk-yrs)   Types: Cigarettes  Smokeless Tobacco Never    Labs: Review Flowsheet       Latest Ref Rng & Units 03/12/2019 05/03/2019  Labs for ITP Cardiac and Pulmonary Rehab  Cholestrol 100 - 199 mg/dL 086  96   LDL (calc) 0 - 99 mg/dL 59  46   HDL-C >57 mg/dL 31  34   Trlycerides 0 - 149 mg/dL 70  79   Hemoglobin Q4O 4.8 - 5.6 % 6.0  -  TCO2 22 - 32 mmol/L 18  -    Details             Pulmonary Assessment Scores:  Pulmonary Assessment Scores     Row Name 09/30/22 1517 02/15/23 1131       ADL UCSD   ADL Phase Entry Exit    SOB Score total 28 --    Rest 0 --    Walk 1 --    Stairs 3 --    Bath 1 --    Dress 1 --    Shop 1 --      CAT Score   CAT Score 9 --      mMRC Score   mMRC Score 1 0             UCSD: Self-administered rating of dyspnea associated with activities of daily living (ADLs) 6-point scale (0 = "not at all" to 5 = "maximal or unable to do because of breathlessness")  Scoring Scores range from 0 to 120.  Minimally important difference is 5 units  CAT: CAT can identify the health impairment of COPD patients and is better correlated with disease progression.  CAT has a scoring range of zero to 40. The CAT  score is classified into four groups of low (less than 10), medium (10 - 20), high (21-30) and very high (31-40) based on the impact level of disease on health status. A CAT score over 10  suggests significant symptoms.  A worsening CAT score could be explained by an exacerbation, poor medication adherence, poor inhaler technique, or progression of COPD or comorbid conditions.  CAT MCID is 2 points  mMRC: mMRC (Modified Medical Research Council) Dyspnea Scale is used to assess the degree of baseline functional disability in patients of respiratory disease due to dyspnea. No minimal important difference is established. A decrease in score of 1 point or greater is considered a positive change.   Pulmonary Function Assessment:  Pulmonary Function Assessment - 09/28/22 1117       Breath   Shortness of Breath Yes;Limiting activity             Exercise Target Goals: Exercise Program Goal: Individual exercise prescription set using results from initial 6 min walk test and THRR while considering  patient's activity barriers and safety.   Exercise Prescription Goal: Initial exercise prescription builds to 30-45 minutes a day of aerobic activity, 2-3 days per week.  Home exercise guidelines will be given to patient during program as part of exercise prescription that the participant will acknowledge.  Education: Aerobic Exercise: - Group verbal and visual presentation on the components of exercise prescription. Introduces F.I.T.T principle from ACSM for exercise prescriptions.  Reviews F.I.T.T. principles of aerobic exercise including progression. Written material given at graduation. Flowsheet Row Pulmonary Rehab from 09/30/2022 in [facility] Cardiac and Pulmonary Rehab  Education need identified 09/30/22       Education: Resistance Exercise: - Group verbal and visual presentation on the components of exercise prescription. Introduces F.I.T.T principle from ACSM for exercise prescriptions  Reviews  F.I.T.T. principles of resistance exercise including progression. Written material given at graduation.    Education: Exercise & Equipment Safety: - Individual verbal instruction and demonstration of equipment use and safety with use of the equipment. Flowsheet Row Pulmonary Rehab from 09/30/2022 in [facility] Cardiac and Pulmonary Rehab  Date 09/28/22  Educator [facility]  Instruction Review Code 1- Verbalizes Understanding       Education: Exercise Physiology & General Exercise Guidelines: - Group verbal and written instruction with models to review the exercise physiology of the cardiovascular system and associated critical values. Provides general exercise guidelines with specific guidelines to those with heart or lung disease.    Education: Flexibility, Balance, Mind/Body Relaxation: - Group verbal and visual presentation with interactive activity on the components of exercise prescription. Introduces F.I.T.T principle from ACSM for exercise prescriptions. Reviews F.I.T.T. principles of flexibility and balance exercise training including progression. Also discusses the mind body connection.  Reviews various relaxation techniques to help reduce and manage stress (i.e. Deep breathing, progressive muscle relaxation, and visualization). Balance handout provided to take home. Written material given at graduation.   Activity Barriers & Risk Stratification:  Activity Barriers & Cardiac Risk Stratification - 09/30/22 1541       Activity Barriers & Cardiac Risk Stratification   Activity Barriers Joint Problems;Back Problems;Muscular Weakness;Deconditioning;Shortness of Breath;Neck/Spine Problems             6 Minute Walk:  6 Minute Walk     Row Name 09/30/22 1537 02/15/23 1124       6 Minute Walk   Phase Initial Discharge    Distance 1730 feet 2025 feet    Distance % Change -- 17.1 %    Distance Feet Change -- 295 ft    Walk Time 6 minutes 6 minutes    # of Rest Breaks 0 0    MPH 3.28  3.84    METS  4.24 4.87    RPE 13 15    Perceived Dyspnea  0 1    VO2 Peak 14.84 17.05    Symptoms Yes (comment) Yes (comment)    Comments leg tightness calf cramps    Resting HR 51 bpm 49 bpm    Resting BP 98/52 120/70    Resting Oxygen Saturation  97 % 97 %    Exercise Oxygen Saturation  during 6 min walk 98 % 98 %    Max Ex. HR 84 bpm 100 bpm    Max Ex. BP 142/60 142/62    2 Minute Post BP 96/58 124/62      Interval HR   1 Minute HR 65 78    2 Minute HR 78 91    3 Minute HR 77 93    4 Minute HR 82 93    5 Minute HR 82 96    6 Minute HR 84 100    2 Minute Post HR 54 65    Interval Heart Rate? Yes Yes      Interval Oxygen   Interval Oxygen? Yes Yes    Baseline Oxygen Saturation % 97 % 97 %    1 Minute Oxygen Saturation % 97 % 98 %    1 Minute Liters of Oxygen 0 L  RA 0 L    2 Minute Oxygen Saturation % 97 % 98 %    2 Minute Liters of Oxygen 0 L  RA 0 L    3 Minute Oxygen Saturation % 97 % 98 %    3 Minute Liters of Oxygen 0 L  RA 0 L    4 Minute Oxygen Saturation % 98 % 98 %    4 Minute Liters of Oxygen 0 L  RA 0 L    5 Minute Oxygen Saturation % 98 % 98 %    5 Minute Liters of Oxygen 0 L  RA 0 L    6 Minute Oxygen Saturation % 97 % 98 %    6 Minute Liters of Oxygen 0 L  RA 0 L    2 Minute Post Oxygen Saturation % 98 % 99 %    2 Minute Post Liters of Oxygen 0 L  RA 0 L            Oxygen Initial Assessment:  Oxygen Initial Assessment - 09/28/22 1117       Home Oxygen   Home Oxygen Device None    Sleep Oxygen Prescription None    Home Exercise Oxygen Prescription None    Home Resting Oxygen Prescription None      Initial 6 min Walk   Oxygen Used None      Program Oxygen Prescription   Program Oxygen Prescription None      Intervention   Short Term Goals To learn and demonstrate proper use of respiratory medications;To learn and understand importance of maintaining oxygen saturations>88%;To learn and demonstrate proper pursed lip breathing techniques or  other breathing techniques. ;To learn and understand importance of monitoring SPO2 with pulse oximeter and demonstrate accurate use of the pulse oximeter.    Long  Term Goals Maintenance of O2 saturations>88%;Compliance with respiratory medication;Demonstrates proper use of MDI's;Exhibits proper breathing techniques, such as pursed lip breathing or other method taught during program session;Verbalizes importance of monitoring SPO2 with pulse oximeter and return demonstration             Oxygen Re-Evaluation:  Oxygen Re-Evaluation     Row Name 10/05/22  1401 10/23/22 1113 11/23/22 1007 12/18/22 1124 02/22/23 1359     Program Oxygen Prescription   Program Oxygen Prescription -- None None None None     Home Oxygen   Home Oxygen Device -- None None None None   Sleep Oxygen Prescription -- None None None None   Home Exercise Oxygen Prescription -- None None None None   Home Resting Oxygen Prescription -- None None None None     Goals/Expected Outcomes   Short Term Goals -- To learn and understand importance of maintaining oxygen saturations>88%;To learn and demonstrate proper pursed lip breathing techniques or other breathing techniques.  To learn and understand importance of monitoring SPO2 with pulse oximeter and demonstrate accurate use of the pulse oximeter.;To learn and understand importance of maintaining oxygen saturations>88%;To learn and demonstrate proper pursed lip breathing techniques or other breathing techniques.  To learn and understand importance of maintaining oxygen saturations>88%;To learn and understand importance of monitoring SPO2 with pulse oximeter and demonstrate accurate use of the pulse oximeter. To learn and understand importance of maintaining oxygen saturations>88%;To learn and understand importance of monitoring SPO2 with pulse oximeter and demonstrate accurate use of the pulse oximeter.   Long  Term Goals -- Exhibits proper breathing techniques, such as pursed lip  breathing or other method taught during program session Verbalizes importance of monitoring SPO2 with pulse oximeter and return demonstration;Maintenance of O2 saturations>88%;Exhibits proper breathing techniques, such as pursed lip breathing or other method taught during program session Maintenance of O2 saturations>88%;Verbalizes importance of monitoring SPO2 with pulse oximeter and return demonstration Maintenance of O2 saturations>88%;Verbalizes importance of monitoring SPO2 with pulse oximeter and return demonstration   Comments Reviewed PLB technique with pt.  Talked about how it works and it's importance in maintaining their exercise saturations. Informed patient how to perform the Pursed Lipped breathing technique. Told patient to Inhale through the nose and out the mouth with pursed lips to keep their airways open, help oxygenate them better, practice when at rest or doing strenuous activity. Patient Verbalizes understanding of technique and will work on and be reiterated during [name]. Brian Mcclure is doing well in rehab.  His saturations are doing well and he has been using PLB to help with breathing. Brian Mcclure has a pulse oximeter to check his oxygen saturation at home. Informed and explained why it is important to have one. Reviewed that oxygen saturations should be 88 percent and above. Brian Mcclure feels comfortable checking his oxygen at home. He has been using his pulse oximeter at home. He has no current concerns related to his breathing.   Goals/Expected Outcomes Short: Become more profiecient at using PLB.   Long: Become independent at using PLB. Short: use PLB with exertion. Long: use PLB on exertion proficiently and independently. Short: use PLB with exertion. Long: use PLB on exertion proficiently and independently and monitor saturations Short: monitor oxygen at home with exertion. Long: maintain oxygen saturations above 88 percent independently. Short: continue monitoring his oxygen at home with exertion.  Long: maintain oxygen saturation management            Oxygen Discharge (Final Oxygen Re-Evaluation):  Oxygen Re-Evaluation - 02/22/23 1359       Program Oxygen Prescription   Program Oxygen Prescription None      Home Oxygen   Home Oxygen Device None    Sleep Oxygen Prescription None    Home Exercise Oxygen Prescription None    Home Resting Oxygen Prescription None      Goals/Expected Outcomes  Short Term Goals To learn and understand importance of maintaining oxygen saturations>88%;To learn and understand importance of monitoring SPO2 with pulse oximeter and demonstrate accurate use of the pulse oximeter.    Long  Term Goals Maintenance of O2 saturations>88%;Verbalizes importance of monitoring SPO2 with pulse oximeter and return demonstration    Comments Brian Mcclure feels comfortable checking his oxygen at home. He has been using his pulse oximeter at home. He has no current concerns related to his breathing.    Goals/Expected Outcomes Short: continue monitoring his oxygen at home with exertion. Long: maintain oxygen saturation management             Initial Exercise Prescription:  Initial Exercise Prescription - 09/30/22 1500       Date of Initial Exercise RX and Referring Provider   Date 09/30/22    Referring Provider Vito Berger MD      Oxygen   Maintain Oxygen Saturation 88% or higher      Treadmill   MPH 3    Grade 1    Minutes 15    METs 3.71      Recumbant Bike   Level 3    Watts 57    Minutes 15    METs 4.24      NuStep   Level 4    Minutes 15    METs 4.24      Prescription Details   Frequency (times per week) 2    Duration Progress to 30 minutes of continuous aerobic without signs/symptoms of physical distress      Intensity   THRR 40-80% of Max Heartrate 92-133    Ratings of Perceived Exertion 11-13    Perceived Dyspnea 0-4      Progression   Progression Continue to progress workloads to maintain intensity without signs/symptoms of  physical distress.      Resistance Training   Training Prescription Yes    Weight 4 lb    Reps 10-15             Perform Capillary Blood Glucose checks as needed.  Exercise Prescription Changes:   Exercise Prescription Changes     Row Name 09/30/22 1500 10/13/22 1400 10/29/22 1300 10/30/22 1100 11/10/22 1500     Response to Exercise   Blood Pressure (Admit) 98/52 108/60 94/60 -- 108/64   Blood Pressure (Exercise) 142/60 124/68 148/62 -- --   Blood Pressure (Exit) 96/58 104/62 102/62 -- 98/56   Heart Rate (Admit) 51 bpm 55 bpm 49 bpm -- 49 bpm   Heart Rate (Exercise) 84 bpm 85 bpm 111 bpm -- 88 bpm   Heart Rate (Exit) 54 bpm 62 bpm 63 bpm -- 59 bpm   Oxygen Saturation (Admit) 97 % 98 % 98 % -- 97 %   Oxygen Saturation (Exercise) 97 % 92 % 95 % -- 93 %   Oxygen Saturation (Exit) 98 % 97 % 96 % -- 96 %   Rating of Perceived Exertion (Exercise) 13 13 14  -- 12   Perceived Dyspnea (Exercise) 0 1 0 -- 0   Symptoms leg tightness slight SOB none -- none   Comments Results 2nd full day of exercise -- -- --   Duration -- Continue with 30 min of aerobic exercise without signs/symptoms of physical distress. Continue with 30 min of aerobic exercise without signs/symptoms of physical distress. -- Continue with 30 min of aerobic exercise without signs/symptoms of physical distress.   Intensity -- THRR unchanged THRR unchanged -- THRR unchanged  Progression   Progression -- Continue to progress workloads to maintain intensity without signs/symptoms of physical distress. Continue to progress workloads to maintain intensity without signs/symptoms of physical distress. -- Continue to progress workloads to maintain intensity without signs/symptoms of physical distress.   Average METs -- 4.84 5.92 -- 6.59     Resistance Training   Training Prescription -- Yes Yes -- Yes   Weight -- 6 lb 6 lb -- 6 lb   Reps -- 10-15 10-15 -- 10-15     Interval Training   Interval Training -- No No --  No     Treadmill   MPH -- 3.8 4 -- 3.7   Grade -- 1 5 -- 5.5   Minutes -- 15 30 -- 15   METs -- 4.43 6.82 -- 6.64     Recumbant Elliptical   Level -- 11 -- -- --   Minutes -- 15 -- -- --   METs -- 6.2 -- -- --     REL-XR   Level -- -- 12 -- 13   Minutes -- -- 15 -- 15   METs -- -- 7.8 -- 9     T5 Nustep   Level -- -- 5 -- --   Minutes -- -- 15 -- --   METs -- -- 4 -- --     Home Exercise Plan   Plans to continue exercise at -- -- -- Home (comment)  walking, working in [employer] (comment)  walking, working in garden   Frequency -- -- -- Add 2 additional days to program exercise sessions. Add 2 additional days to program exercise sessions.   Initial Home Exercises Provided -- -- -- 10/30/22 10/30/22     Oxygen   Maintain Oxygen Saturation -- -- 88% or higher -- 88% or higher    Row Name 11/24/22 0800 12/08/22 1400 12/23/22 1400 01/07/23 0800 01/20/23 1600     Response to Exercise   Blood Pressure (Admit) 112/58 124/70 100/60 130/58 100/58   Blood Pressure (Exit) 108/56 100/54 124/60 102/56 96/56   Heart Rate (Admit) 52 bpm 74 bpm 51 bpm 50 bpm 48 bpm   Heart Rate (Exercise) 81 bpm 100 bpm 93 bpm 98 bpm 104 bpm   Heart Rate (Exit) 65 bpm 69 bpm 59 bpm 70 bpm 69 bpm   Oxygen Saturation (Admit) 96 % 98 % 97 % 96 % 98 %   Oxygen Saturation (Exercise) 95 % 95 % 95 % 98 % 94 %   Oxygen Saturation (Exit) 97 % 96 % 97 % 96 % 95 %   Rating of Perceived Exertion (Exercise) 12 14 14 14 14    Perceived Dyspnea (Exercise) 0 1 0 0 0   Symptoms none none none none none   Duration Continue with 30 min of aerobic exercise without signs/symptoms of physical distress. Continue with 30 min of aerobic exercise without signs/symptoms of physical distress. Continue with 30 min of aerobic exercise without signs/symptoms of physical distress. Continue with 30 min of aerobic exercise without signs/symptoms of physical distress. Continue with 30 min of aerobic exercise without signs/symptoms of  physical distress.   Intensity THRR unchanged THRR unchanged THRR unchanged THRR unchanged THRR unchanged     Progression   Progression Continue to progress workloads to maintain intensity without signs/symptoms of physical distress. Continue to progress workloads to maintain intensity without signs/symptoms of physical distress. Continue to progress workloads to maintain intensity without signs/symptoms of physical distress. Continue to progress workloads to maintain intensity  without signs/symptoms of physical distress. Continue to progress workloads to maintain intensity without signs/symptoms of physical distress.   Average METs 4.77 5.66 5.85 7.5 6.96     Resistance Training   Training Prescription Yes Yes Yes Yes Yes   Weight 6 lb 6 lb 6 lb 6lb 6 lb   Reps 10-15 10-15 10-15 10-15 10-15     Interval Training   Interval Training No No No No No     Treadmill   MPH 3.4 3.4  up to 3.8 3.4 3.4 3.8   Grade 2.5 6  up to 6.5 7 5 7    Minutes 30 30 30 30 30    METs 4.77 7.9 6.88 7.35 7.57     NuStep   Level -- 4 -- -- --   Minutes -- 15 -- -- --   METs -- 5.7 -- -- --     T5 Nustep   Level -- -- 4 -- --   Minutes -- -- 30 -- --   METs -- -- 2.2 -- --     Home Exercise Plan   Plans to continue exercise at Home (comment)  walking, working in garden Home (comment)  walking, working in [employer] (comment)  walking, working in garden Home (comment)  walking  working in [employer] (comment)  walking  working in garden   Frequency Add 2 additional days to program exercise sessions. Add 2 additional days to program exercise sessions. Add 2 additional days to program exercise sessions. Add 2 additional days to program exercise sessions. Add 2 additional days to program exercise sessions.   Initial Home Exercises Provided 10/30/22 10/30/22 10/30/22 10/30/22 10/30/22     Oxygen   Maintain Oxygen Saturation 88% or higher 88% or higher 88% or higher 88% or higher 88% or higher    Row Name  02/05/23 0800 02/17/23 0800           Response to Exercise   Blood Pressure (Admit) 102/58 102/60      Blood Pressure (Exit) 100/62 102/62      Heart Rate (Admit) 48 bpm 55 bpm      Heart Rate (Exercise) 99 bpm 114 bpm      Heart Rate (Exit) 65 bpm 72 bpm      Oxygen Saturation (Admit) 98 % 98 %      Oxygen Saturation (Exercise) 95 % 95 %      Oxygen Saturation (Exit) 96 % 96 %      Rating of Perceived Exertion (Exercise) 14 14      Perceived Dyspnea (Exercise) 0 0      Symptoms none none      Duration Continue with 30 min of aerobic exercise without signs/symptoms of physical distress. Continue with 30 min of aerobic exercise without signs/symptoms of physical distress.      Intensity THRR unchanged THRR unchanged        Progression   Progression Continue to progress workloads to maintain intensity without signs/symptoms of physical distress. Continue to progress workloads to maintain intensity without signs/symptoms of physical distress.      Average METs 7.9 7.9        Resistance Training   Training Prescription Yes Yes      Weight 8 lb 8 lb      Reps 10-15 10-15        Interval Training   Interval Training No No        Treadmill   MPH 3.4 3.4  Grade 11 10      Minutes 30 30      METs 8.76 8.29        NuStep   Level -- 6      Minutes -- 15        Home Exercise Plan   Plans to continue exercise at Home (comment)  walking  working in garden Home (comment)  walking  working in garden      Frequency Add 2 additional days to program exercise sessions. Add 2 additional days to program exercise sessions.      Initial Home Exercises Provided 10/30/22 10/30/22        Oxygen   Maintain Oxygen Saturation 88% or higher 88% or higher               Exercise Comments:   Exercise Comments     Row Name 10/05/22 1401           Exercise Comments First full day of exercise!  Patient was oriented to gym and equipment including functions, settings, policies, and  procedures.  Patient's individual exercise prescription and treatment plan were reviewed.  All starting workloads were established based on the results of the 6 minute walk test done at initial orientation visit.  The plan for exercise progression was also introduced and progression will be customized based on patient's performance and goals.                Exercise Goals and Review:   Exercise Goals     Row Name 09/30/22 1541             Exercise Goals   Increase Physical Activity Yes       Intervention Provide advice, education, support and counseling about physical activity/exercise needs.;Develop an individualized exercise prescription for aerobic and resistive training based on initial evaluation findings, risk stratification, comorbidities and participant's personal goals.       Expected Outcomes Short Term: Attend rehab on a regular basis to increase amount of physical activity.;Long Term: Add in home exercise to make exercise part of routine and to increase amount of physical activity.;Long Term: Exercising regularly at least 3-5 days a week.       Increase Strength and Stamina Yes       Intervention Provide advice, education, support and counseling about physical activity/exercise needs.;Develop an individualized exercise prescription for aerobic and resistive training based on initial evaluation findings, risk stratification, comorbidities and participant's personal goals.       Expected Outcomes Short Term: Increase workloads from initial exercise prescription for resistance, speed, and METs.;Short Term: Perform resistance training exercises routinely during rehab and add in resistance training at home;Long Term: Improve cardiorespiratory fitness, muscular endurance and strength as measured by increased METs and functional capacity ( )       Able to understand and use rate of perceived exertion (RPE) scale Yes       Intervention Provide education and explanation on how to use  RPE scale       Expected Outcomes Short Term: Able to use RPE daily in rehab to express subjective intensity level;Long Term:  Able to use RPE to guide intensity level when exercising independently       Able to understand and use Dyspnea scale Yes       Intervention Provide education and explanation on how to use Dyspnea scale       Expected Outcomes Short Term: Able to use Dyspnea scale daily in rehab to express subjective sense  of shortness of breath during exertion;Long Term: Able to use Dyspnea scale to guide intensity level when exercising independently       Knowledge and understanding of Target Heart Rate Range (THRR) Yes       Intervention Provide education and explanation of THRR including how the numbers were predicted and where they are located for reference       Expected Outcomes Short Term: Able to state/look up THRR;Short Term: Able to use daily as guideline for intensity in rehab;Long Term: Able to use THRR to govern intensity when exercising independently       Able to check pulse independently Yes       Intervention Provide education and demonstration on how to check pulse in carotid and radial arteries.;Review the importance of being able to check your own pulse for safety during independent exercise       Expected Outcomes Short Term: Able to explain why pulse checking is important during independent exercise;Long Term: Able to check pulse independently and accurately       Understanding of Exercise Prescription Yes       Intervention Provide education, explanation, and written materials on patient's individual exercise prescription       Expected Outcomes Short Term: Able to explain program exercise prescription;Long Term: Able to explain home exercise prescription to exercise independently                Exercise Goals Re-Evaluation :  Exercise Goals Re-Evaluation     Row Name 10/05/22 1402 10/13/22 1451 10/29/22 1340 10/30/22 1116 11/10/22 1511     Exercise Goal  Re-Evaluation   Exercise Goals Review Increase Physical Activity;Able to understand and use rate of perceived exertion (RPE) scale;Knowledge and understanding of Target Heart Rate Range (THRR);Understanding of Exercise Prescription;Increase Strength and Stamina;Able to check pulse independently Increase Physical Activity;Increase Strength and Stamina;Understanding of Exercise Prescription Increase Physical Activity;Increase Strength and Stamina;Understanding of Exercise Prescription Increase Physical Activity;Increase Strength and Stamina;Able to understand and use rate of perceived exertion (RPE) scale;Able to understand and use Dyspnea scale;Knowledge and understanding of Target Heart Rate Range (THRR);Able to check pulse independently;Understanding of Exercise Prescription Increase Physical Activity;Increase Strength and Stamina;Understanding of Exercise Prescription   Comments Reviewed RPE scale, THR and program prescription with pt today.  Pt voiced understanding and was given a copy of goals to take home. Brian Mcclure is off to a good start with the program. He is already working at pretty significant high loads, around level 11 on the XR and up to 3.8 mph speed on the treadmill, all with appropriate RPEs. We will continue to monitor his progress as he furthers in the program. Brian Mcclure is doing well in rehab. He recently increased his overall average MET level to 5.92 METs. He also increased his treadmill workload to 4 mph with an incline of 5%, and he walked at this pace for 30 minutes. He improved to level 12 on the XR and level 5 on the T5 nustep as well. We will continue to monitor his progress in the program. Reviewed home exercise with pt today.  Pt plans to add in some walking in the garden for exercise. Reviewed THR, pulse, RPE, sign and symptoms, pulse oximetery and when to call 911 or MD.  Also discussed weather considerations and indoor options.  Pt voiced understanding. We talked about the importance of  adding in home exerise. Brian Mcclure has been doing well in rehab. He has increased to a 3.7/5.5% treadmill workload which he completes for 30  minutes at a time.  He increased to level 13 on the XR, working over 9 METS!  His oxygen is staying well above 88%. We will continue to monitor.   Expected Outcomes Short: Use RPE daily to regulate intensity.  Long: Follow program prescription in THR. Short: Follow current exercise prescription, progress workloads slowly and gradually Long: Increase overall strength and stamina Short: Continue to progressively increase workloads. Long: Continue to improve strength and stamina. Short: Add in some exercise at home Long: Conitnue to improve stamina Short: Add in intervals to regimen Long: Continue to increase overall MET level and stamina    Row Name 11/23/22 0959 11/24/22 0808 12/08/22 1433 12/23/22 1424 01/07/23 0826     Exercise Goal Re-Evaluation   Exercise Goals Review Increase Physical Activity;Increase Strength and Stamina;Understanding of Exercise Prescription Increase Physical Activity;Increase Strength and Stamina;Understanding of Exercise Prescription Increase Physical Activity;Increase Strength and Stamina;Understanding of Exercise Prescription Increase Physical Activity;Increase Strength and Stamina;Understanding of Exercise Prescription Increase Physical Activity;Increase Strength and Stamina;Understanding of Exercise Prescription   Comments Brian Mcclure returned to rehab today.  He has been staying active by working in the garden while he was out.  He is happy to be back and ready to get back to exercise again. Brian Mcclure has only attended rehab once since the last review. During that one session he walked on the treadmill for 30 minutes at a speed of 3.4 mph with an incline of 2.5%. He also has continued to use 6 lb hand weights for resistance training. We will continue to monitor his progress in the program. Brian Mcclure continues to do well in rehab. He prefers to walk on the  treadmill and continues to use that as his primary mode of exercise. Staff will continue to encourage patient to use a variety of machines. He has increased his speed on the treadmill, varying between 3.4-3.8 mph with a range of 2.5-6% incline. It does not appear that he is completing intervals. His RPEs remain in appropriate range. Will continue to monitor. Brian Mcclure continues to do well in rehab. He has consistently walked the treadmill for 30 minutes at a speed of 3.4 mph with incline ranging from 6 to 7%. He also continues to work on the T5 nustep at level 4 and use 6 lb hand weights for resistance training as well. We will continue to monitor his progress in the program. Brian Mcclure conmtinues to do well with his exercise progression. He has one time done 7/5% on TM. He has stayed steady eith 3.5/8% other days. He continues with 6lb hand weights.  We will continue to monitor his progress in the program.   Expected Outcomes Short: Continue to return to routine exercise Long: conitnue to improve stamina Short: Attend rehab regularly. Long: Continue to improve strength and stamina. Short: Keep consistent workloads on treadmill, try different seated machines Long: Continue to increase overall MET level and stamina Short: Keep consistent workloads on treadmill, try different seated machines. Long: Continue to increase overall MET level and stamina. STG: Work on other equipment for variation. LTG COntinued progression with exercise    Row Name 01/20/23 1624 02/05/23 0818 02/17/23 0856 02/22/23 1357       Exercise Goal Re-Evaluation   Exercise Goals Review Increase Physical Activity;Increase Strength and Stamina;Understanding of Exercise Prescription Increase Physical Activity;Increase Strength and Stamina;Understanding of Exercise Prescription Increase Physical Activity;Increase Strength and Stamina;Understanding of Exercise Prescription Knowledge and understanding of Target Heart Rate Range (THRR);Able to understand and  use rate of perceived exertion (RPE) scale;Able  to check pulse independently;Able to understand and use Dyspnea scale;Understanding of Exercise Prescription    Comments Brian Mcclure is doing well in the program. He continues to walk the treadmill for 30 minutes but did increase his workload to a speed of 3.8 mph with an incline of 7%. He also continues to use 6 lb hand weights for resistance training as well. We will continue to monitor his progress. Brian Mcclure is doing well in the program. He continues to walk the treadmill for 30 minutes, but did increase his workload by increasing his incline to 11% and maintaining a speed of 3.4 mph. He also improved to 8 lb hand weights for resistance training as well. We will continue to monitor his progress. Brian Mcclure is doing well in the program. He continues to walk the treadmill for 30 minutes, and has kept his workload consistent. He did increase from level 4 to level 6 on the T4 nustep. He is due for his post and will look to improve on it. We will continue to monitor his progress. Brian Mcclure is set to graduate in a few more sessions. He feels ready to exercise on his own and feels comfortable with the RPE and Dyspnea scale and modifying his exercise as necessary. He has achieved his goals on the treadmill and is looking into buying himself one so he can continue walking in the winter. During the fall, he plans on walking outside around his property.    Expected Outcomes Short: Try other equipment for variation. Long: Continue to improve strength and stamina. Short: Try other equipment for variation. Long: Continue to improve strength and stamina. Short: Improve on post . Long: Continue exercise to improve strength and stamina. Short: graduate from pulmonary rehab. Long: independently maintain an exercise routine             Discharge Exercise Prescription (Final Exercise Prescription Changes):  Exercise Prescription Changes - 02/17/23 0800       Response to Exercise   Blood  Pressure (Admit) 102/60    Blood Pressure (Exit) 102/62    Heart Rate (Admit) 55 bpm    Heart Rate (Exercise) 114 bpm    Heart Rate (Exit) 72 bpm    Oxygen Saturation (Admit) 98 %    Oxygen Saturation (Exercise) 95 %    Oxygen Saturation (Exit) 96 %    Rating of Perceived Exertion (Exercise) 14    Perceived Dyspnea (Exercise) 0    Symptoms none    Duration Continue with 30 min of aerobic exercise without signs/symptoms of physical distress.    Intensity THRR unchanged      Progression   Progression Continue to progress workloads to maintain intensity without signs/symptoms of physical distress.    Average METs 7.9      Resistance Training   Training Prescription Yes    Weight 8 lb    Reps 10-15      Interval Training   Interval Training No      Treadmill   MPH 3.4    Grade 10    Minutes 30    METs 8.29      NuStep   Level 6    Minutes 15      Home Exercise Plan   Plans to continue exercise at Home (comment)   walking  working in garden   Frequency Add 2 additional days to program exercise sessions.    Initial Home Exercises Provided 10/30/22      Oxygen   Maintain Oxygen Saturation 88% or  higher             Nutrition:  Target Goals: Understanding of nutrition guidelines, daily intake of sodium 1500mg , cholesterol 200mg , calories 30% from fat and 7% or less from saturated fats, daily to have 5 or more servings of fruits and vegetables.  Education: All About Nutrition: -Group instruction provided by verbal, written material, interactive activities, discussions, models, and posters to present general guidelines for heart healthy nutrition including fat, fiber, MyPlate, the role of sodium in heart healthy nutrition, utilization of the nutrition label, and utilization of this knowledge for meal planning. Follow up email sent as well. Written material given at graduation.   Biometrics:  Pre Biometrics - 09/30/22 1542       Pre Biometrics   Height 6' 2.25"  (1.886 m)    Weight 176 lb 14.4 oz (80.2 kg)    Waist Circumference 34 inches    Hip Circumference 40 inches    Waist to Hip Ratio 0.85 %    BMI (Calculated) 22.56    Single Leg Stand 30 seconds             Post Biometrics - 02/15/23 1130        Post  Biometrics   Height 6' 2.25" (1.886 m)    Weight 182 lb 6.4 oz (82.7 kg)    Waist Circumference 36 inches    Hip Circumference 40 inches    Waist to Hip Ratio 0.9 %    BMI (Calculated) 23.26    Single Leg Stand 30 seconds             Nutrition Therapy Plan and Nutrition Goals:  Nutrition Therapy & Goals - 09/30/22 1520       Intervention Plan   Intervention Prescribe, educate and counsel regarding individualized specific dietary modifications aiming towards targeted core components such as weight, hypertension, lipid management, diabetes, heart failure and other comorbidities.    Expected Outcomes Short Term Goal: Understand basic principles of dietary content, such as calories, fat, sodium, cholesterol and nutrients.;Short Term Goal: A plan has been developed with personal nutrition goals set during dietitian appointment.;Long Term Goal: Adherence to prescribed nutrition plan.             Nutrition Assessments:  MEDIFICTS Score Key: ?70 Need to make dietary changes  40-70 Heart Healthy Diet ? 40 Therapeutic Level Cholesterol Diet  Flowsheet Row Pulmonary Rehab from 09/30/2022 in [facility]  Picture Your Plate Total Score on Admission 53      Picture Your Plate Scores: <56 Unhealthy dietary pattern with much room for improvement. 41-50 Dietary pattern unlikely to meet recommendations for good health and room for improvement. 51-60 More healthful dietary pattern, with some room for improvement.  >60 Healthy dietary pattern, although there may be some specific behaviors that could be improved.   Nutrition Goals Re-Evaluation:  Nutrition Goals Re-Evaluation     Row Name 10/23/22 1120  11/23/22 1003 12/18/22 1125         Goals   Current Weight 181 lb (82.1 kg) -- 180 lb (81.6 kg)     Nutrition Goal Eat smaller portions. Short: Choose and plan snacks accordingly to patients caloric intake to improve breathing. Long: Maintain a diet independently that meets their caloric intake to aid in daily shortness of breath. --     Comment Patient was informed on why it is important to maintain a balanced diet when dealing with Respiratory issues. Explained that it takes a lot  of energy to breath and when they are short of breath often they will need to have a good diet to help keep up with the calories they are expending for breathing. Brian Mcclure conintues to work on his diet.  He feels that he is doing pretty good with his diet.  He continues to make changes and trying to find healthier options.  We talked about staying away from salt with heart history as well. Patient would not like to meet with the dietician. Will follow up next review.     Expected Outcome Short: Choose and plan snacks accordingly to patients caloric intake to improve breathing. Long: Maintain a diet independently that meets their caloric intake to aid in daily shortness of breath. Short; conitnue to make healthier choices Long: Conitnue to improve balance --              Nutrition Goals Discharge (Final Nutrition Goals Re-Evaluation):  Nutrition Goals Re-Evaluation - 12/18/22 1125       Goals   Current Weight 180 lb (81.6 kg)    Comment Patient would not like to meet with the dietician. Will follow up next review.             Psychosocial: Target Goals: Acknowledge presence or absence of significant depression and/or stress, maximize coping skills, provide positive support system. Participant is able to verbalize types and ability to use techniques and skills needed for reducing stress and depression.   Education: Stress, Anxiety, and Depression - Group verbal and visual presentation to define topics covered.   Reviews how body is impacted by stress, anxiety, and depression.  Also discusses healthy ways to reduce stress and to treat/manage anxiety and depression.  Written material given at graduation.   Education: Sleep Hygiene -Provides group verbal and written instruction about how sleep can affect your health.  Define sleep hygiene, discuss sleep cycles and impact of sleep habits. Review good sleep hygiene tips.    Initial Review & Psychosocial Screening:  Initial Psych Review & Screening - 09/28/22 1120       Initial Review   Current issues with Current Stress Concerns    Comments He can look to his Wife, kids and grandkids for support. He is having more shortness of breath than usual and wants to get that under control. He has done with Cardiac Rehab program in 2020.      Family Dynamics   Good Support System? Yes      Barriers   Psychosocial barriers to participate in program The patient should benefit from training in stress management and relaxation.      Screening Interventions   Interventions Encouraged to exercise;To provide support and resources with identified psychosocial needs;Program counselor consult;Provide feedback about the scores to participant    Expected Outcomes Short Term goal: Utilizing psychosocial counselor, staff and physician to assist with identification of specific Stressors or current issues interfering with healing process. Setting desired goal for each stressor or current issue identified.;Long Term Goal: Stressors or current issues are controlled or eliminated.;Short Term goal: Identification and review with participant of any Quality of Life or Depression concerns found by scoring the questionnaire.;Long Term goal: The participant improves quality of Life and PHQ9 Scores as seen by post scores and/or verbalization of changes             Quality of Life Scores:  Scores of 19 and below usually indicate a poorer quality of life in these areas.  A difference  of  2-3 points  is a clinically meaningful difference.  A difference of 2-3 points in the total score of the Quality of Life Index has been associated with significant improvement in overall quality of life, self-image, physical symptoms, and general health in studies assessing change in quality of life.  PHQ-9: Review Flowsheet       10/23/2022 09/30/2022 10/23/2019 05/01/2019  Depression screen PHQ 2/9  Decreased Interest 0 0 0 0  Down, Depressed, Hopeless 0 0 0 0  PHQ - 2 Score 0 0 0 0  Altered sleeping 0 0 0 0  Tired, decreased energy 3 3 1 1   Change in appetite 0 2 0 1  Feeling bad or failure about yourself  0 0 0 0  Trouble concentrating 0 0 0 0  Moving slowly or fidgety/restless 0 0 0 0  Suicidal thoughts 0 0 0 0  PHQ-9 Score 3 5 1 2   Difficult doing work/chores Somewhat difficult Very difficult Somewhat difficult Somewhat difficult    Details           Interpretation of Total Score  Total Score Depression Severity:  1-4 = Minimal depression, 5-9 = Mild depression, 10-14 = Moderate depression, 15-19 = Moderately severe depression, 20-27 = Severe depression   Psychosocial Evaluation and Intervention:  Psychosocial Evaluation - 09/28/22 1121       Psychosocial Evaluation & Interventions   Interventions Encouraged to exercise with the program and follow exercise prescription;Relaxation education;Stress management education    Comments He can look to his Wife, kids and grandkids for support. He is having more shortness of breath than usual and wants to get that under control. He has done with Cardiac Rehab program in 2020.    Expected Outcomes Short: Start [name] to help with mood. Long: Maintain a healthy mental state.    Continue Psychosocial Services  Follow up required by staff             Psychosocial Re-Evaluation:  Psychosocial Re-Evaluation     Row Name 10/23/22 1117 11/23/22 1001 12/18/22 1127 02/22/23 1351       Psychosocial Re-Evaluation   Current  issues with Current Stress Concerns Current Stress Concerns None Identified None Identified    Comments Reviewed patient health questionnaire (PHQ-9) with patient for follow up. Previously, patients score indicated signs/symptoms of depression.  Reviewed to see if patient is improving symptom wise while in program.  Score improved and patient states that it is because he has been able to increase his exercise level. Brian Mcclure has been out with a heart workup after a nuclear stress test, but echo found everything to be normal, so he is pleased with that. He is feeling pretty good otherwise and just meet things where they are and tackles the daily tasks.  He tries to not get too stressed.  He is sleeping well for most part. Patient reports no issues with their current mental states, sleep, stress, depression or anxiety. Will follow up with patient in a few weeks for any changes. Brian Mcclure is ready to graduate from pulmonary rehab. He is proud of the progress he has made during his time in the program. He has been walking faster and on more incline than when he started. He wants to get himself a treadmill for home since the closest gym is over 20 minutes away. He is getting ready for hunting season which will keep him very busy and active. He enjoys spending time with his family and is happy that his son now lives close by so  he gets to see his grandchildren more often. He enjoys taking them fishing and playing outside with them. They plan to welcome their third grandchild this December. He is motivated to stay on track with his health and is thankful for the program    Expected Outcomes Short: Continue to attend [name] regularly for regular exercise and social engagement. Long: Continue to improve symptoms and manage a positive mental state. Short; Return to routine exercise for mental boost Long: Continue to stay positive Short: Continue to exercise regularly to support mental health and notify staff of any changes. Long:  maintain mental health and well being through teaching of rehab or prescribed medications independently. Short: graduate from pulmonary rehab. Long: Maintain positive mental health habits    Interventions Encouraged to attend Pulmonary Rehabilitation for the exercise Encouraged to attend Pulmonary Rehabilitation for the exercise Encouraged to attend Pulmonary Rehabilitation for the exercise --    Continue Psychosocial Services  Follow up required by staff -- Follow up required by staff --             Psychosocial Discharge (Final Psychosocial Re-Evaluation):  Psychosocial Re-Evaluation - 02/22/23 1351       Psychosocial Re-Evaluation   Current issues with None Identified    Comments Brian Mcclure is ready to graduate from pulmonary rehab. He is proud of the progress he has made during his time in the program. He has been walking faster and on more incline than when he started. He wants to get himself a treadmill for home since the closest gym is over 20 minutes away. He is getting ready for hunting season which will keep him very busy and active. He enjoys spending time with his family and is happy that his son now lives close by so he gets to see his grandchildren more often. He enjoys taking them fishing and playing outside with them. They plan to welcome their third grandchild this December. He is motivated to stay on track with his health and is thankful for the program    Expected Outcomes Short: graduate from pulmonary rehab. Long: Maintain positive mental health habits             Education: Education Goals: Education classes will be provided on a weekly basis, covering required topics. Participant will state understanding/return demonstration of topics presented.  Learning Barriers/Preferences:  Learning Barriers/Preferences - 09/28/22 1116       Learning Barriers/Preferences   Learning Barriers None    Learning Preferences Individual Instruction             General Pulmonary  Education Topics:  Infection Prevention: - Provides verbal and written material to individual with discussion of infection control including proper hand washing and proper equipment cleaning during exercise session. Flowsheet Row Pulmonary Rehab from 09/30/2022 in [facility] Cardiac and Pulmonary Rehab  Date 09/28/22  Educator [facility]  Instruction Review Code 1- Verbalizes Understanding       Falls Prevention: - Provides verbal and written material to individual with discussion of falls prevention and safety. Flowsheet Row Pulmonary Rehab from 09/30/2022 in [facility] Cardiac and Pulmonary Rehab  Date 09/28/22  Educator [facility]  Instruction Review Code 1- Verbalizes Understanding       Chronic Lung Disease Review: - Group verbal instruction with posters, models, PowerPoint presentations and videos,  to review new updates, new respiratory medications, new advancements in procedures and treatments. Providing information on websites and "800" numbers for continued self-education. Includes information about supplement oxygen, available portable oxygen systems, continuous and intermittent  flow rates, oxygen safety, concentrators, and Medicare reimbursement for oxygen. Explanation of Pulmonary Drugs, including class, frequency, complications, importance of spacers, rinsing mouth after steroid MDI's, and proper cleaning methods for nebulizers. Review of basic lung anatomy and physiology related to function, structure, and complications of lung disease. Review of risk factors. Discussion about methods for diagnosing sleep apnea and types of masks and machines for OSA. Includes a review of the use of types of environmental controls: home humidity, furnaces, filters, dust mite/pet prevention, HEPA vacuums. Discussion about weather changes, air quality and the benefits of nasal washing. Instruction on Warning signs, infection symptoms, calling MD promptly, preventive modes, and value of vaccinations. Review of effective airway  clearance, coughing and/or vibration techniques. Emphasizing that all should Create an Action Plan. Written material given at graduation.   AED/CPR: - Group verbal and written instruction with the use of models to demonstrate the basic use of the AED with the basic ABC's of resuscitation.    Anatomy and Cardiac Procedures: - Group verbal and visual presentation and models provide information about basic cardiac anatomy and function. Reviews the testing methods done to diagnose heart disease and the outcomes of the test results. Describes the treatment choices: Medical Management, Angioplasty, or Coronary Bypass Surgery for treating various heart conditions including Myocardial Infarction, Angina, Valve Disease, and Cardiac Arrhythmias.  Written material given at graduation.   Medication Safety: - Group verbal and visual instruction to review commonly prescribed medications for heart and lung disease. Reviews the medication, class of the drug, and side effects. Includes the steps to properly store meds and maintain the prescription regimen.  Written material given at graduation.   Other: -Provides group and verbal instruction on various topics (see comments)   Knowledge Questionnaire Score:  Knowledge Questionnaire Score - 09/30/22 1520       Knowledge Questionnaire Score   Pre Score 15/18              Core Components/Risk Factors/Patient Goals at Admission:  Personal Goals and Risk Factors at Admission - 09/28/22 1118       Core Components/Risk Factors/Patient Goals on Admission    Weight Management Yes;Weight Maintenance;Weight Gain    Intervention Weight Management: Develop a combined nutrition and exercise program designed to reach desired caloric intake, while maintaining appropriate intake of nutrient and fiber, sodium and fats, and appropriate energy expenditure required for the weight goal.;Weight Management: Provide education and appropriate resources to help  participant work on and attain dietary goals.;Weight Management/Obesity: Establish reasonable short term and long term weight goals.    Expected Outcomes Short Term: Continue to assess and modify interventions until short term weight is achieved;Long Term: Adherence to nutrition and physical activity/exercise program aimed toward attainment of established weight goal;Weight Maintenance: Understanding of the daily nutrition guidelines, which includes 25-35% calories from fat, 7% or less cal from saturated fats, less than 200mg  cholesterol, less than 1.5gm of sodium, & 5 or more servings of fruits and vegetables daily;Understanding recommendations for meals to include 15-35% energy as protein, 25-35% energy from fat, 35-60% energy from carbohydrates, less than 200mg  of dietary cholesterol, 20-35 gm of total fiber daily;Understanding of distribution of calorie intake throughout the day with the consumption of 4-5 meals/snacks;Weight Gain: Understanding of general recommendations for a high calorie, high protein meal plan that promotes weight gain by distributing calorie intake throughout the day with the consumption for 4-5 meals, snacks, and/or supplements    Tobacco Cessation Yes    Intervention Assist the participant in  steps to quit. Provide individualized education and counseling about committing to Tobacco Cessation, relapse prevention, and pharmacological support that can be provided by physician.;Education officer, environmental, assist with locating and accessing local/national Quit Smoking programs, and support quit date choice.    Expected Outcomes Short Term: Will demonstrate readiness to quit, by selecting a quit date.;Short Term: Will quit all tobacco product use, adhering to prevention of relapse plan.;Long Term: Complete abstinence from all tobacco products for at least 12 months from quit date.    Improve shortness of breath with ADL's Yes    Intervention Provide education, individualized exercise  plan and daily activity instruction to help decrease symptoms of SOB with activities of daily living.    Expected Outcomes Short Term: Improve cardiorespiratory fitness to achieve a reduction of symptoms when performing ADLs;Long Term: Be able to perform more ADLs without symptoms or delay the onset of symptoms    Hypertension Yes    Intervention Provide education on lifestyle modifcations including regular physical activity/exercise, weight management, moderate sodium restriction and increased consumption of fresh fruit, vegetables, and low fat dairy, alcohol moderation, and smoking cessation.;Monitor prescription use compliance.    Expected Outcomes Short Term: Continued assessment and intervention until BP is < 140/66mm HG in hypertensive participants. < 130/87mm HG in hypertensive participants with diabetes, heart failure or chronic kidney disease.;Long Term: Maintenance of blood pressure at goal levels.    Lipids Yes    Intervention Provide education and support for participant on nutrition & aerobic/resistive exercise along with prescribed medications to achieve LDL 70mg , HDL >40mg .    Expected Outcomes Short Term: Participant states understanding of desired cholesterol values and is compliant with medications prescribed. Participant is following exercise prescription and nutrition guidelines.;Long Term: Cholesterol controlled with medications as prescribed, with individualized exercise RX and with personalized nutrition plan. Value goals: LDL < 70mg , HDL > 40 mg.             Education:Diabetes - Individual verbal and written instruction to review signs/symptoms of diabetes, desired ranges of glucose level fasting, after meals and with exercise. Acknowledge that pre and post exercise glucose checks will be done for 3 sessions at entry of program.   Know Your Numbers and Heart Failure: - Group verbal and visual instruction to discuss disease risk factors for cardiac and pulmonary disease  and treatment options.  Reviews associated critical values for Overweight/Obesity, Hypertension, Cholesterol, and Diabetes.  Discusses basics of heart failure: signs/symptoms and treatments.  Introduces Heart Failure Zone chart for action plan for heart failure.  Written material given at graduation. Flowsheet Row Pulmonary Rehab from 09/30/2022 in [facility] Cardiac and Pulmonary Rehab  Education need identified 09/30/22       Core Components/Risk Factors/Patient Goals Review:   Goals and Risk Factor Review     Row Name 10/23/22 1115 11/23/22 1004 12/18/22 1128 02/22/23 1349       Core Components/Risk Factors/Patient Goals Review   Personal Goals Review Improve shortness of breath with ADL's Improve shortness of breath with ADL's;Tobacco Cessation;Weight Management/Obesity;Hypertension Tobacco Cessation Hypertension;Tobacco Cessation    Review Spoke to patient about their shortness of breath and what they can do to improve. Patient has been informed of breathing techniques when starting the program. Patient is informed to tell staff if they have had any med changes and that certain meds they are taking or not taking can be causing shortness of breath. Brian Mcclure is doing well in rehab. His weight is staying steady for most part.  His breathing is  doing well and he feels he is doing well.  He is still smoking a pack each day with no plans to quit.  His pressures are doing well.  He is feeling pretty good overall.  He had some acupuncture to help with plantar facsitis and has more testing coming up next week. Brian Mcclure has been cutting back on smoking. He is cuting back and telling himself to wait a little longer until the next one. He is trying to quit but putting off smoking and fighting his urges. He is not interested in taking medications to help him quit. Brian Mcclure has been doing well in cardiac rehab. He has a few more sessions before he graduates. His blood pressure has been stable and he stays on top of it at home. He  is still working on quitting smoking and is proud of the progress he has made. He is compliant with taking his medications and plans to keep up his hard work after graduation    Expected Outcomes Short: Attend [name] regularly to improve shortness of breath with ADL's. Long: maintain independence with ADL's Short: Keep [name] updated with testing.  Long: Conitnue to work on breathing Short: reduce tobacco use. Long: Be tobacco free. Short: graduate from pulmonary rehab. Long: maintain independence in managing risk factors             Core Components/Risk Factors/Patient Goals at Discharge (Final Review):   Goals and Risk Factor Review - 02/22/23 1349       Core Components/Risk Factors/Patient Goals Review   Personal Goals Review Hypertension;Tobacco Cessation    Review Brian Mcclure has been doing well in cardiac rehab. He has a few more sessions before he graduates. His blood pressure has been stable and he stays on top of it at home. He is still working on quitting smoking and is proud of the progress he has made. He is compliant with taking his medications and plans to keep up his hard work after graduation    Expected Outcomes Short: graduate from pulmonary rehab. Long: maintain independence in managing risk factors             ITP Comments:  ITP Comments     Row Name 09/28/22 1115 09/30/22 1516 10/05/22 1401 10/07/22 0825 11/04/22 1610   ITP Comments Virtual Visit completed. Patient informed on EP and RD appointment and 6 Minute walk test. Patient also informed of patient health questionnaires on My Chart. Patient Verbalizes understanding. Visit diagnosis can be found in [facility] media,patient is [place]. Completed and gym orientation. Initial ITP created and sent for review to Dr. Vida Rigger, Medical Director. First full day of exercise!  Patient was oriented to gym and equipment including functions, settings, policies, and procedures.  Patient's individual exercise prescription and treatment  plan were reviewed.  All starting workloads were established based on the results of the 6 minute walk test done at initial orientation visit.  The plan for exercise progression was also introduced and progression will be customized based on patient's performance and goals. 30 Day review completed. Medical Director ITP review done, changes made as directed, and signed approval by Medical Director.     new to program 30 day review completed. ITP sent to Dr. Jinny Sanders, Medical Director of  Pulmonary Rehab. Continue with ITP unless changes are made by physician.    Row Name 11/23/22 9604 12/02/22 0839 12/30/22 0930 01/26/23 1426 02/24/23 1139   ITP Comments Brian Mcclure returned to rehab today after heart workup. 30 Day review  completed. Medical Director ITP review done, changes made as directed, and signed approval by Medical Director. 30 Day review completed. Medical Director ITP review done, changes made as directed, and signed approval by Medical Director. 30 Day review completed. Medical Director ITP review done, changes made as directed, and signed approval by Medical Director. 30 Day review completed. Medical Director ITP review done, changes made as directed, and signed approval by Medical Director.            Comments:

## 2023-03-01 ENCOUNTER — Encounter: Payer: No Typology Code available for payment source | Admitting: *Deleted

## 2023-03-01 DIAGNOSIS — R0609 Other forms of dyspnea: Secondary | ICD-10-CM | POA: Diagnosis not present

## 2023-03-01 NOTE — Progress Notes (Signed)
Daily Session Note  Patient Details  Name: Brian Mcclure MRN: [id number] Date of Birth: 01/23/1956 Referring Provider:   Flowsheet Row Pulmonary Rehab from 09/30/2022 in [facility] Cardiac and Pulmonary Rehab  Referring Provider Vito Berger MD       Encounter Date: 03/01/2023  Check In:  Session Check In - 03/01/23 1121       Check-In   Supervising physician immediately available to respond to emergencies See telemetry face sheet for immediately available ER MD    Location [facility]-Cardiac & Pulmonary Rehab    Staff Present Lanny Hurst, RN, ADN;Meredith Jewel Baize, RN Mabeline Caras, BS, ACSM CEP, Exercise Physiologist;Other   Girtha Rm, MS   Virtual Visit No    Medication changes reported     No    Fall or balance concerns reported    No    Tobacco Cessation No Change    Warm-up and Cool-down Performed on first and last piece of equipment    Resistance Training Performed Yes    VAD Patient? No    PAD/SET Patient? No      Pain Assessment   Currently in Pain? No/denies                Social History   Tobacco Use  Smoking Status Every Day   Current packs/day: 1.00   Average packs/day: 1 pack/day for 40.0 years (40.0 ttl pk-yrs)   Types: Cigarettes  Smokeless Tobacco Never    Goals Met:  Independence with exercise equipment Exercise tolerated well No report of concerns or symptoms today Strength training completed today  Goals Unmet:  Not Applicable  Comments: Pt able to follow exercise prescription today without complaint.  Will continue to monitor for progression.    Dr. Bethann Punches is Medical Director for [facility] Cardiac Rehabilitation.  Dr. Vida Rigger is Medical Director for [facility] Pulmonary Rehabilitation.

## 2023-03-04 ENCOUNTER — Other Ambulatory Visit: Payer: Self-pay

## 2023-03-04 DIAGNOSIS — E785 Hyperlipidemia, unspecified: Secondary | ICD-10-CM

## 2023-03-05 ENCOUNTER — Ambulatory Visit: Payer: Medicare PPO

## 2023-03-08 ENCOUNTER — Encounter: Payer: No Typology Code available for payment source | Admitting: *Deleted

## 2023-03-08 ENCOUNTER — Other Ambulatory Visit
Admission: RE | Admit: 2023-03-08 | Discharge: 2023-03-08 | Disposition: A | Discharge status: Home / Self Care | Payer: Medicare PPO | Source: Ambulatory Visit | Attending: Nurse Practitioner | Admitting: Nurse Practitioner

## 2023-03-08 DIAGNOSIS — R0609 Other forms of dyspnea: Secondary | ICD-10-CM

## 2023-03-08 DIAGNOSIS — E785 Hyperlipidemia, unspecified: Secondary | ICD-10-CM | POA: Insufficient documentation

## 2023-03-08 LAB — HEPATIC FUNCTION PANEL
ALT: 15 U/L (ref 0–44)
AST: 17 U/L (ref 15–41)
Albumin: 4 g/dL (ref 3.5–5.0)
Alkaline Phosphatase: 55 U/L (ref 38–126)
Bilirubin, Direct: 0.1 mg/dL (ref 0.0–0.2)
Indirect Bilirubin: 0.3 mg/dL (ref 0.3–0.9)
Total Bilirubin: 0.4 mg/dL (ref 0.3–1.2)
Total Protein: 7 g/dL (ref 6.5–8.1)

## 2023-03-08 LAB — LIPID PANEL
Cholesterol: 147 mg/dL (ref 0–200)
HDL: 32 mg/dL — ABNORMAL LOW (ref >40)
LDL Cholesterol: 97 mg/dL (ref 0–99)
Total CHOL/HDL Ratio: 4.6 RATIO
Triglycerides: 88 mg/dL (ref <150)
VLDL: 18 mg/dL (ref 0–40)

## 2023-03-08 NOTE — Progress Notes (Signed)
Daily Session Note  Patient Details  Name: Brian Mcclure MRN: [id number] Date of Birth: 12-24-1955 Referring Provider:   Flowsheet Row Pulmonary Rehab from 09/30/2022 in [facility] Cardiac and Pulmonary Rehab  Referring Provider Vito Berger MD       Encounter Date: 03/08/2023  Check In:  Session Check In - 03/08/23 1121       Check-In   Supervising physician immediately available to respond to emergencies See telemetry face sheet for immediately available ER MD    Location [facility]-Cardiac & Pulmonary Rehab    Staff Present Susann Givens, RN BSN;Margaret Best, MS, Exercise Physiologist;Kelly Madilyn Fireman, BS, ACSM CEP, Exercise Physiologist;Thorn Demas Katrinka Blazing, RN, ADN    Virtual Visit No    Medication changes reported     No    Fall or balance concerns reported    No    Tobacco Cessation No Change    Warm-up and Cool-down Performed on first and last piece of equipment    Resistance Training Performed Yes    VAD Patient? No    PAD/SET Patient? No      Pain Assessment   Currently in Pain? No/denies                Social History   Tobacco Use  Smoking Status Every Day   Current packs/day: 1.00   Average packs/day: 1 pack/day for 40.0 years (40.0 ttl pk-yrs)   Types: Cigarettes  Smokeless Tobacco Never    Goals Met:  Independence with exercise equipment Exercise tolerated well No report of concerns or symptoms today Strength training completed today  Goals Unmet:  Not Applicable  Comments: Pt able to follow exercise prescription today without complaint.  Will continue to monitor for progression.    Dr. Bethann Punches is Medical Director for [facility] Cardiac Rehabilitation.  Dr. Vida Rigger is Medical Director for [facility] Pulmonary Rehabilitation.

## 2023-03-09 ENCOUNTER — Encounter: Payer: Self-pay | Admitting: Nurse Practitioner

## 2023-03-09 ENCOUNTER — Ambulatory Visit: Payer: Medicare PPO | Attending: Nurse Practitioner | Admitting: Nurse Practitioner

## 2023-03-09 VITALS — BP 110/60 | HR 46 | Ht 75.0 in | Wt 185.1 lb

## 2023-03-09 DIAGNOSIS — I5032 Chronic diastolic (congestive) heart failure: Secondary | ICD-10-CM

## 2023-03-09 DIAGNOSIS — I480 Paroxysmal atrial fibrillation: Secondary | ICD-10-CM | POA: Diagnosis not present

## 2023-03-09 DIAGNOSIS — E785 Hyperlipidemia, unspecified: Secondary | ICD-10-CM | POA: Diagnosis not present

## 2023-03-09 DIAGNOSIS — I2510 Atherosclerotic heart disease of native coronary artery without angina pectoris: Secondary | ICD-10-CM | POA: Diagnosis not present

## 2023-03-09 DIAGNOSIS — Z720 Tobacco use: Secondary | ICD-10-CM

## 2023-03-09 DIAGNOSIS — R001 Bradycardia, unspecified: Secondary | ICD-10-CM

## 2023-03-09 DIAGNOSIS — I739 Peripheral vascular disease, unspecified: Secondary | ICD-10-CM

## 2023-03-09 NOTE — Patient Instructions (Signed)
Medication Instructions:  Your physician recommends the following medication changes.  STOP TAKING: Zetia  START TAKING: Rosuvastatin 20 mg at night  *If you need a refill on your cardiac medications before your next appointment, please call your pharmacy*  Lab Work: None If you have labs (blood work) drawn today and your tests are completely normal, you will receive your results only by: MyChart Message (if you have MyChart) OR A paper copy in the mail If you have any lab test that is abnormal or we need to change your treatment, we will call you to review the results.  Testing/Procedures: None  Follow-Up: At [facility], you and your health needs are our priority.  As part of our continuing mission to provide you with exceptional heart care, we have created designated Provider Care Teams.  These Care Teams include your primary Cardiologist (physician) and Advanced Practice Providers (APPs -  Physician Assistants and Nurse Practitioners) who all work together to provide you with the care you need, when you need it.  We recommend signing up for the patient portal called "MyChart".  Sign up information is provided on this After Visit Summary.  MyChart is used to connect with patients for Virtual Visits (Telemedicine).  Patients are able to view lab/test results, encounter notes, upcoming appointments, etc.  Non-urgent messages can be sent to your provider as well.   To learn more about what you can do with MyChart, go to [email or web address].    Your next appointment:   6 month(s)  Provider:   You may see Yvonne Kendall, MD or one of the following Advanced Practice Providers on your designated Care Team:   Nicolasa Ducking, NP

## 2023-03-09 NOTE — Progress Notes (Signed)
Office Visit    Patient Name: Brian Mcclure Date of Encounter: 03/09/2023  Primary Care Provider:  [facility] Primary Cardiologist:  Yvonne Kendall, MD  Chief Complaint    67 y.o. male with a history of CAD status post anterior MI and LAD stenting in August 2020,  ischemic cardiomyopathy, HFimpEF, LV apical thrombus, paroxysmal atrial fibrillation, peripheral vascular disease, sinus and junctional bradycardia in the setting of anterior MI, hyperlipidemia, COPD, and tobacco abuse, who presents for f/u today r/t CAD and claudication.  Past Medical History    Past Medical History:  Diagnosis Date   Actinic keratosis    Acute ST elevation myocardial infarction (STEMI) of anterior wall (HCC)    a. 02/2019 s/p PCI/DES ->LAD.   Apical mural thrombus following MI (HCC)    a. 02/2019 s/p Ant MI-->Echo: Small, fixed thrombus on the apical wall of LV-->coumadin.   Barrett's esophagus    CAD (coronary artery disease)    a. 02/2019 Ant STEMI/PCI: LM nl, LAD 100p (4x18 Resolute Onyx DES), LCX/RCA min irregs, EF 35-45%; b. 10/2022 MV: EF 38% (nl by echo). No isch/infarct.   Chronic back pain    Colon polyps    a. 07/2017 Colonoscopy: 5mm sigmoid polyp, tubular adenoma, diverticulosis, int hemorrhoids.   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 02/2019 Echo: EF 40-45%; b. 06/2019 Echo: EF 50-55%; c. 10/2022 Echo: EF 55-60%, no rwma, nl RV fxn, mild MR.   Hydrocele    Hyperlipidemia    Ischemic cardiomyopathy    a. 02/2019 Echo: EF 40-45%; b. 06/2019 Echo: EF 50-55%; c. 10/2022 Echo: EF 55-60%.   Myalgia    Osteoarthritis    C-spine   PAF (paroxysmal atrial fibrillation) (HCC)    a. CHA2DS2VASc = 3-->eliquis   Peripheral vascular disease (HCC)    a. 2018 s/p aorto-iliac stent graft; b.  11/2022 ABI/Duplex: R 1.24, L 1.02. Bilateral inflow dzs noted on duplex.   Reactive airway disease    Seborrheic keratosis    Sinus bradycardia    a. 02/2019 Jxnl rhythm and sinus brady in setting of Ant MI-->[facility] d/c'd.   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   Tobacco use disorder    Tremor    Past Surgical History:  Procedure Laterality Date   [facility]   [facility]     COLONOSCOPY WITH PROPOFOL N/A 08/05/2017   Procedure: COLONOSCOPY WITH PROPOFOL;  Surgeon: Pasty Spillers, MD;  Location: [facility] ENDOSCOPY;  Service: Endoscopy;  Laterality: N/A;   CORONARY/GRAFT ACUTE MI REVASCULARIZATION N/A 03/12/2019   Procedure: Coronary/Graft Acute MI Revascularization;  Surgeon: Swaziland, Peter M, MD;  Location: [facility] INVASIVE CV LAB;  Service: Cardiovascular;  Laterality: N/A;   ESOPHAGOGASTRODUODENOSCOPY (EGD) WITH PROPOFOL N/A 08/05/2017   Procedure: ESOPHAGOGASTRODUODENOSCOPY (EGD) WITH PROPOFOL;  Surgeon: Pasty Spillers, MD;  Location: [facility] ENDOSCOPY;  Service: Endoscopy;  Laterality: N/A;   LEFT HEART CATH AND CORONARY ANGIOGRAPHY N/A 03/12/2019   Procedure: LEFT HEART CATH AND CORONARY ANGIOGRAPHY;  Surgeon: Swaziland, Peter M, MD;  Location: [facility] INVASIVE CV LAB;  Service: Cardiovascular;  Laterality: N/A;   NASAL SINUS SURGERY     several   SPLENECTOMY  01/2017    Allergies  Allergies  Allergen Reactions  Statins Other (See Comments)    Leg Cramps   Amoxicillin-Pot Clavulanate Diarrhea   Atorvastatin Other (See Comments)   Atorvastatin Calcium Other (See Comments)   Clavulanic Acid Diarrhea   Codeine Other (See Comments)   Menthol     Other Reaction(s): Congestion Nose   Nicotine Polacrilex    Pravastatin Other (See Comments)   Bupropion Other (See Comments)    Caused somnolence (and didn't help to stop smoking)   Flunisolide Other (See Comments)    didn't work  Other Reaction(s): Nasal congestion   Nicotine Other (See  Comments)    didn't help stop smoking    History of Present Illness      67 y.o. y/o male with above complex past medical history including CAD, ischemic cardiomyopathy, HFimpEF, LV apical thrombus, paroxysmal atrial fibrillation, peripheral vascular disease, sinus and junctional bradycardia, hyperlipidemia, COPD, and tobacco abuse.  In August 2020, he presented with anterior STEMI.  Diagnostic catheterization revealed a total occlusion of the proximal LAD and otherwise nonobstructive disease.  EF was 35 to 45% by ventriculography.  The LAD was successfully treated with a drug-eluting stent.  Follow-up echo showed persistent LV dysfunction with an EF of 40 to 45% and a small, fixed thrombus in the apical wall of left ventricle, and he was placed on Coumadin.  During hospitalization, he had junctional rhythm and sinus bradycardia resulting in discontinuation of home dose of diltiazem.  He subsequently developed atrial flutter in the outpatient setting and was placed on low-dose beta-blocker and was maintained on warfarin, which was later changed to Eliquis.    Brian Mcclure subsequently transitioned his cardiology care to the [facility], prior to returning to our practice in April 2024.  At that time, he reported more exertional dyspnea and fatigue as well as episodic chest pain, and bilateral calf claudication.  Stress testing was undertaken and showed no evidence of ischemia but depressed EF at 38%.  This was followed by echocardiogram, which showed an EF of 55-60% w/o regional wall motion abnormalities.  Lower extremity ABIs were normal although duplex suggested some degree of in-stent restenosis.  He saw Dr. [name] 02/18/2023 w/ recommendation for ongoing medical therapy and a walking program.   Since his August [facility] visit, Brian Mcclure has done relatively well.  He continues to exercise at pulmonary rehab and is able to achieve greater than 8 METS on the treadmill.  He has not noticed a significant change in  lower extremity symptoms after coming off of rosuvastatin, and is interested in getting back on it.  He now recognizes that his Symptoms are claudication which typically come on at about 9 minutes while on the treadmill and resolved by about 18 minutes of ongoing walking.  He does feel that a walking program has helped him significantly.  He does not experience chest pain.  He has some degree of chronic dyspnea on exertion though notes that his 6-minute walk has steadily improved.  He denies palpitations, PND, orthopnea, dizziness, syncope, edema, or early satiety.  Home Medications    Current Outpatient Medications  Medication Sig Dispense Refill   albuterol (VENTOLIN HFA) 108 (90 Base) MCG/ACT inhaler      apixaban (ELIQUIS) 5 MG TABS tablet TAKE ONE TABLET BY MOUTH EVERY 12 HOURS CAUTION BLOOD THINNER     Ca Carbonate-Mag Hydroxide (ROLAIDS PO) Take 1 tablet by mouth at bedtime as needed (heartburn/acid reflux).     cholecalciferol (VITAMIN D) 25 MCG (1000 UT) tablet Take 1,000 Units by mouth  2 (two) times daily.     diclofenac sodium (VOLTAREN) 1 % GEL Apply 1 application  topically daily as needed (pain).     famotidine (PEPCID) 20 MG tablet Take 20 mg by mouth daily.     hydrocortisone cream 1 % SMARTSIG:1 Topical Daily     levofloxacin (LEVAQUIN) 750 MG tablet TAKE ONE TABLET BY MOUTH ONCE FOR BACTEREMIA . DO NOT TAKE WITH DAIRY PRODUCTS;ANTACID;SUCRALFATE;IRON;ZINC. TAKE FOR FEVER MORE THAN 100.5 WHILE ON YOUR WAY TO CLOSEST EMERGENCY DEPARTMENT.     loratadine (CLARITIN) 10 MG tablet TAKE ONE TABLET BY MOUTH EVERY DAY FOR ALLERGIES     losartan (COZAAR) 25 MG tablet Take 0.5 tablets (12.5 mg total) by mouth daily. 15 tablet 1   metoprolol succinate (TOPROL XL) 25 MG 24 hr tablet Take 0.5 tablets (12.5 mg total) by mouth daily. 45 tablet 3   nitroGLYCERIN (NITROSTAT) 0.4 MG SL tablet Place 1 tablet (0.4 mg total) under the tongue every 5 (five) minutes x 3 doses as needed for chest pain. 25  tablet 12   oxymetazoline (AFRIN) 0.05 % nasal spray Place 1 spray into both nostrils 2 (two) times daily as needed for congestion.     rosuvastatin (CRESTOR) 20 MG tablet Take 20 mg by mouth at bedtime.     Simethicone (GAS-X PO) Take 1 tablet by mouth 3 (three) times daily as needed (heartburn).     STIOLTO RESPIMAT 2.5-2.5 MCG/ACT AERS INHALE 2 INHALATIONS BY ORAL INHALATION IN THE MORNING FOR CHRONIC OBSTRUCTIVE LUNGS (Patient not taking: Reported on 03/09/2023)     No current facility-administered medications for this visit.     Review of Systems    Chronic dyspnea on exertion and mild calf claudication.  He denies chest pain, palpitations, PND, orthopnea, dizziness, syncope, edema, or early satiety.  All other systems reviewed and are otherwise negative except as noted above.    Physical Exam    VS:  BP 110/60 (BP Location: Left Arm, Patient Position: Sitting, Cuff Size: Normal)   Pulse (!) 46   Ht 6\' 3"  (1.905 m)   Wt 185 lb 2 oz (84 kg)   SpO2 98%   BMI 23.14 kg/m  , BMI Body mass index is 23.14 kg/m.     GEN: Well nourished, well developed, in no acute distress. HEENT: normal. Neck: Supple, no JVD, carotid bruits, or masses. Cardiac: RRR, no murmurs, rubs, or gallops. No clubbing, cyanosis, edema.  Radials 2+/PT 1+ and equal bilaterally.  Respiratory:  Respirations regular and unlabored, clear to auscultation bilaterally. GI: Soft, nontender, nondistended, BS + x 4. MS: no deformity or atrophy. Skin: warm and dry, no rash. Neuro:  Strength and sensation are intact. Psych: Normal affect.  Accessory Clinical Findings    ECG personally reviewed by me today - EKG Interpretation Date/Time:  Tuesday March 09 2023 10:31:14 EDT Ventricular Rate:  46 PR Interval:  140 QRS Duration:  76 QT Interval:  438 QTC Calculation: 383 R Axis:   31  Text Interpretation: Sinus bradycardia When compared with ECG of 05-Nov-2021 17:27, No significant change was found Confirmed by  Nicolasa Ducking [phone]) on 03/09/2023 10:36:11 AM  - no acute changes.  Lab Results  Component Value Date   WBC 11.5 (H) 11/05/2021   HGB 13.7 11/05/2021   HCT 42.4 11/05/2021   MCV 94.2 11/05/2021   PLT 215 11/05/2021   Lab Results  Component Value Date   CREATININE 1.13 11/05/2021   BUN 12 11/05/2021   NA 140  11/05/2021   K 3.6 11/05/2021   CL 106 11/05/2021   CO2 28 11/05/2021   Lab Results  Component Value Date   ALT 15 03/08/2023   AST 17 03/08/2023   ALKPHOS 55 03/08/2023   BILITOT 0.4 03/08/2023   Lab Results  Component Value Date   CHOL 147 03/08/2023   HDL 32 (L) 03/08/2023   LDLCALC 97 03/08/2023   TRIG 88 03/08/2023   CHOLHDL 4.6 03/08/2023    Lab Results  Component Value Date   HGBA1C 6.0 (H) 03/12/2019    Assessment & Plan    1.  Coronary artery disease: History of anterior STEMI with LAD stenting in August 2020.  Recently seen in April 2024 with complaints of exercise intolerance and occasional chest discomfort.  Stress testing was undertaken and showed an EF of 30% without ischemia or infarct.  Subsequent echo showed normal LV function without regional wall motion abnormalities.  He continues to do well at pulmonary rehab, exercising without chest pain.  He has chronic, stable dyspnea on exertion.  We previously stopped his rosuvastatin in the setting of lower leg symptoms, that have persisted despite being off of statin, and therefore, we will plan to resume, especially in light of a rise in his LDL from 47 to 97 while off of rosuvastatin and on Zetia.  DC Zetia.  He otherwise remains on beta-blocker and ARB therapy.  No aspirin in the setting of chronic Eliquis.  2.  Chronic heart failure with improved ejection fraction: Echo earlier this year with an EF of 55 to 60% and normal RV function, with mild MR.  He is euvolemic on exam with normal blood pressure and heart rate.  Tolerating pulmonary rehab well.  He remains on beta-blocker and ARB.  3.   Peripheral vascular disease: Recently seen by Dr. [name] in August 2024 in the setting of some degree of in-stent restenosis on the left.  He continues to note mild calf symptoms while walking on the treadmill that seem to come on around 9 minutes of walking and resolved by 18 minutes of walking.  He will continue his walking program, as he feels that this does help significantly.  4.  Paroxysmal atrial fibrillation/sinus bradycardia: EKG today shows sinus bradycardia at 46 bpm, which she says has been common for him over many many years.  He notes that it has been a few years since he has been aware of a paroxysmal of atrial fibrillation.  He also notes normal heart rate response with activity and is otherwise asymptomatic.  Continue low-dose beta-blocker and Eliquis.  5.  Hyperlipidemia: We held rosuvastatin following his last visit due to leg aching, and he was subsequently switched to Zetia.  With this switch, he has not noticed any significant change in his lower leg symptoms, which are likely due to claudication.  In the absence of rosuvastatin, and while on Zetia, LDL has risen from 47 to 97.  We agreed to discontinue Zetia and resume rosuvastatin.  6.  Tobacco abuse: He has cut back some, now smoking just less than a pack a day.  He is now actively trying to quit and plans to cut back further.  Complete cessation advised.  7.  Disposition: Follow-up in 6 months or sooner if necessary.  Nicolasa Ducking, NP 03/09/2023, 12:09 PM

## 2023-03-12 ENCOUNTER — Encounter: Payer: No Typology Code available for payment source | Admitting: *Deleted

## 2023-03-12 DIAGNOSIS — R0609 Other forms of dyspnea: Secondary | ICD-10-CM

## 2023-03-12 NOTE — Progress Notes (Signed)
Pulmonary Individual Treatment Plan  Patient Details  Name: Brian Mcclure MRN: [id number] Date of Birth: 1955-12-03 Referring Provider:   Flowsheet Row Pulmonary Rehab from 09/30/2022 in [facility] Cardiac and Pulmonary Rehab  Referring Provider Vito Berger MD       Initial Encounter Date:  Flowsheet Row Pulmonary Rehab from 09/30/2022 in [facility] Cardiac and Pulmonary Rehab  Date 09/30/22       Visit Diagnosis: Dyspnea on exertion  Patient's Home Medications on Admission:  Current Outpatient Medications:    albuterol (VENTOLIN HFA) 108 (90 Base) MCG/ACT inhaler, , Disp: , Rfl:    apixaban (ELIQUIS) 5 MG TABS tablet, TAKE ONE TABLET BY MOUTH EVERY 12 HOURS CAUTION BLOOD THINNER, Disp: , Rfl:    Ca Carbonate-Mag Hydroxide (ROLAIDS PO), Take 1 tablet by mouth at bedtime as needed (heartburn/acid reflux)., Disp: , Rfl:    cholecalciferol (VITAMIN D) 25 MCG (1000 UT) tablet, Take 1,000 Units by mouth 2 (two) times daily., Disp: , Rfl:    diclofenac sodium (VOLTAREN) 1 % GEL, Apply 1 application  topically daily as needed (pain)., Disp: , Rfl:    famotidine (PEPCID) 20 MG tablet, Take 20 mg by mouth daily., Disp: , Rfl:    hydrocortisone cream 1 %, SMARTSIG:1 Topical Daily, Disp: , Rfl:    levofloxacin (LEVAQUIN) 750 MG tablet, TAKE ONE TABLET BY MOUTH ONCE FOR BACTEREMIA . DO NOT TAKE WITH DAIRY PRODUCTS;ANTACID;SUCRALFATE;IRON;ZINC. TAKE FOR FEVER MORE THAN 100.5 WHILE ON YOUR WAY TO CLOSEST EMERGENCY DEPARTMENT., Disp: , Rfl:    loratadine (CLARITIN) 10 MG tablet, TAKE ONE TABLET BY MOUTH EVERY DAY FOR ALLERGIES, Disp: , Rfl:    losartan (COZAAR) 25 MG tablet, Take 0.5 tablets (12.5 mg total) by mouth daily., Disp: 15 tablet, Rfl: 1   metoprolol succinate (TOPROL XL) 25 MG 24 hr tablet, Take 0.5 tablets (12.5 mg total) by mouth daily., Disp: 45 tablet, Rfl: 3   nitroGLYCERIN (NITROSTAT) 0.4 MG SL tablet, Place 1 tablet (0.4 mg total) under the tongue every 5 (five) minutes x 3 doses as  needed for chest pain., Disp: 25 tablet, Rfl: 12   oxymetazoline (AFRIN) 0.05 % nasal spray, Place 1 spray into both nostrils 2 (two) times daily as needed for congestion., Disp: , Rfl:    rosuvastatin (CRESTOR) 20 MG tablet, Take 20 mg by mouth at bedtime., Disp: , Rfl:    Simethicone (GAS-X PO), Take 1 tablet by mouth 3 (three) times daily as needed (heartburn)., Disp: , Rfl:    STIOLTO RESPIMAT 2.5-2.5 MCG/ACT AERS, INHALE 2 INHALATIONS BY ORAL INHALATION IN THE MORNING FOR CHRONIC OBSTRUCTIVE LUNGS (Patient not taking: Reported on 03/09/2023), Disp: , Rfl:   Past Medical History: Past Medical History:  Diagnosis Date   Actinic keratosis    Acute ST elevation myocardial infarction (STEMI) of anterior wall (HCC)    a. 02/2019 s/p PCI/DES ->LAD.   Apical mural thrombus following MI (HCC)    a. 02/2019 s/p Ant MI-->Echo: Small, fixed thrombus on the apical wall of LV-->coumadin.   Barrett's esophagus    CAD (coronary artery disease)    a. 02/2019 Ant STEMI/PCI: LM nl, LAD 100p (4x18 Resolute Onyx DES), LCX/RCA min irregs, EF 35-45%; b. 10/2022 MV: EF 38% (nl by echo). No isch/infarct.   Chronic back pain    Colon polyps    a. 07/2017 Colonoscopy: 5mm sigmoid polyp, tubular adenoma, diverticulosis, int hemorrhoids.   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 02/2019 Echo: EF 40-45%; b. 06/2019 Echo: EF 50-55%; c. 10/2022 Echo: EF 55-60%, no rwma, nl RV fxn, mild MR.   Hydrocele    Hyperlipidemia    Ischemic cardiomyopathy    a. 02/2019 Echo: EF 40-45%; b. 06/2019 Echo: EF 50-55%; c. 10/2022 Echo: EF 55-60%.   Myalgia    Osteoarthritis    C-spine   PAF (paroxysmal atrial fibrillation) (HCC)    a. CHA2DS2VASc = 3-->eliquis   Peripheral vascular disease (HCC)    a. 2018 s/p aorto-iliac stent graft; b. 11/2022  ABI/Duplex: R 1.24, L 1.02. Bilateral inflow dzs noted on duplex.   Reactive airway disease    Seborrheic keratosis    Sinus bradycardia    a. 02/2019 Jxnl rhythm and sinus brady in setting of Ant MI-->[facility] d/c'd.   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   Tobacco use disorder    Tremor     Tobacco Use: Social History   Tobacco Use  Smoking Status Every Day   Current packs/day: 1.00   Average packs/day: 1 pack/day for 40.0 years (40.0 ttl pk-yrs)   Types: Cigarettes  Smokeless Tobacco Never    Labs: Review Flowsheet       Latest Ref Rng & Units 03/12/2019 05/03/2019 03/08/2023  Labs for ITP Cardiac and Pulmonary Rehab  Cholestrol 0 - 200 mg/dL 161  96  096   LDL (calc) 0 - 99 mg/dL 59  46  97   HDL-C >04 mg/dL 31  34  32   Trlycerides <150 mg/dL 70  79  88   Hemoglobin A1c 4.8 - 5.6 % 6.0  - -  TCO2 22 - 32 mmol/L 18  - -    Details             Pulmonary Assessment Scores:  Pulmonary Assessment Scores     Row Name 09/30/22 1517 02/15/23 1131 03/01/23 1150     ADL UCSD   ADL Phase Entry Exit Exit   SOB Score total 28 -- 5   Rest 0 -- 0   Walk 1 -- 0   Stairs 3 -- 0   Bath 1 -- 0   Dress 1 -- 0   Shop 1 -- 0     CAT Score   CAT Score 9 -- 4     mMRC Score   mMRC Score 1 0 --            UCSD: Self-administered rating of dyspnea associated with activities of daily living (ADLs) 6-point scale (0 = "not at all" to 5 = "maximal or unable to do because of breathlessness")  Scoring Scores range from 0 to 120.  Minimally important difference is 5 units  CAT: CAT can identify the health impairment of COPD patients and is better correlated with disease progression.  CAT has a scoring range of zero to 40. The CAT score is classified into four groups of low (less than 10), medium (10 - 20), high (21-30) and very high (31-40) based on the impact level of disease on health status. A CAT score over 10 suggests significant symptoms.  A  worsening CAT score could be explained by an exacerbation, poor medication adherence, poor inhaler technique, or progression of COPD or comorbid conditions.  CAT MCID is 2 points  mMRC: mMRC (Modified Medical Research Council) Dyspnea Scale  is used to assess the degree of baseline functional disability in patients of respiratory disease due to dyspnea. No minimal important difference is established. A decrease in score of 1 point or greater is considered a positive change.   Pulmonary Function Assessment:  Pulmonary Function Assessment - 09/28/22 1117       Breath   Shortness of Breath Yes;Limiting activity             Exercise Target Goals: Exercise Program Goal: Individual exercise prescription set using results from initial 6 min walk test and THRR while considering  patient's activity barriers and safety.   Exercise Prescription Goal: Initial exercise prescription builds to 30-45 minutes a day of aerobic activity, 2-3 days per week.  Home exercise guidelines will be given to patient during program as part of exercise prescription that the participant will acknowledge.  Education: Aerobic Exercise: - Group verbal and visual presentation on the components of exercise prescription. Introduces F.I.T.T principle from ACSM for exercise prescriptions.  Reviews F.I.T.T. principles of aerobic exercise including progression. Written material given at graduation. Flowsheet Row Pulmonary Rehab from 09/30/2022 in [facility] Cardiac and Pulmonary Rehab  Education need identified 09/30/22       Education: Resistance Exercise: - Group verbal and visual presentation on the components of exercise prescription. Introduces F.I.T.T principle from ACSM for exercise prescriptions  Reviews F.I.T.T. principles of resistance exercise including progression. Written material given at graduation.    Education: Exercise & Equipment Safety: - Individual verbal instruction and demonstration of equipment use and  safety with use of the equipment. Flowsheet Row Pulmonary Rehab from 09/30/2022 in [facility] Cardiac and Pulmonary Rehab  Date 09/28/22  Educator [facility]  Instruction Review Code 1- Verbalizes Understanding       Education: Exercise Physiology & General Exercise Guidelines: - Group verbal and written instruction with models to review the exercise physiology of the cardiovascular system and associated critical values. Provides general exercise guidelines with specific guidelines to those with heart or lung disease.    Education: Flexibility, Balance, Mind/Body Relaxation: - Group verbal and visual presentation with interactive activity on the components of exercise prescription. Introduces F.I.T.T principle from ACSM for exercise prescriptions. Reviews F.I.T.T. principles of flexibility and balance exercise training including progression. Also discusses the mind body connection.  Reviews various relaxation techniques to help reduce and manage stress (i.e. Deep breathing, progressive muscle relaxation, and visualization). Balance handout provided to take home. Written material given at graduation.   Activity Barriers & Risk Stratification:  Activity Barriers & Cardiac Risk Stratification - 09/30/22 1541       Activity Barriers & Cardiac Risk Stratification   Activity Barriers Joint Problems;Back Problems;Muscular Weakness;Deconditioning;Shortness of Breath;Neck/Spine Problems             6 Minute Walk:  6 Minute Walk     Row Name 09/30/22 1537 02/15/23 1124       6 Minute Walk   Phase Initial Discharge    Distance 1730 feet 2025 feet    Distance % Change -- 17.1 %    Distance Feet Change -- 295 ft    Walk Time 6 minutes 6 minutes    # of Rest Breaks 0 0    MPH 3.28 3.84    METS 4.24 4.87    RPE 13 15    Perceived Dyspnea  0 1    VO2 Peak 14.84 17.05    Symptoms Yes (comment) Yes (comment)    Comments leg tightness calf cramps    Resting  HR 51 bpm 49 bpm    Resting BP 98/52  120/70    Resting Oxygen Saturation  97 % 97 %    Exercise Oxygen Saturation  during 6 min walk 98 % 98 %    Max Ex. HR 84 bpm 100 bpm    Max Ex. BP 142/60 142/62    2 Minute Post BP 96/58 124/62      Interval HR   1 Minute HR 65 78    2 Minute HR 78 91    3 Minute HR 77 93    4 Minute HR 82 93    5 Minute HR 82 96    6 Minute HR 84 100    2 Minute Post HR 54 65    Interval Heart Rate? Yes Yes      Interval Oxygen   Interval Oxygen? Yes Yes    Baseline Oxygen Saturation % 97 % 97 %    1 Minute Oxygen Saturation % 97 % 98 %    1 Minute Liters of Oxygen 0 L  RA 0 L    2 Minute Oxygen Saturation % 97 % 98 %    2 Minute Liters of Oxygen 0 L  RA 0 L    3 Minute Oxygen Saturation % 97 % 98 %    3 Minute Liters of Oxygen 0 L  RA 0 L    4 Minute Oxygen Saturation % 98 % 98 %    4 Minute Liters of Oxygen 0 L  RA 0 L    5 Minute Oxygen Saturation % 98 % 98 %    5 Minute Liters of Oxygen 0 L  RA 0 L    6 Minute Oxygen Saturation % 97 % 98 %    6 Minute Liters of Oxygen 0 L  RA 0 L    2 Minute Post Oxygen Saturation % 98 % 99 %    2 Minute Post Liters of Oxygen 0 L  RA 0 L            Oxygen Initial Assessment:  Oxygen Initial Assessment - 09/28/22 1117       Home Oxygen   Home Oxygen Device None    Sleep Oxygen Prescription None    Home Exercise Oxygen Prescription None    Home Resting Oxygen Prescription None      Initial 6 min Walk   Oxygen Used None      Program Oxygen Prescription   Program Oxygen Prescription None      Intervention   Short Term Goals To learn and demonstrate proper use of respiratory medications;To learn and understand importance of maintaining oxygen saturations>88%;To learn and demonstrate proper pursed lip breathing techniques or other breathing techniques. ;To learn and understand importance of monitoring SPO2 with pulse oximeter and demonstrate accurate use of the pulse oximeter.    Long  Term Goals Maintenance of O2 saturations>88%;Compliance  with respiratory medication;Demonstrates proper use of MDI's;Exhibits proper breathing techniques, such as pursed lip breathing or other method taught during program session;Verbalizes importance of monitoring SPO2 with pulse oximeter and return demonstration             Oxygen Re-Evaluation:  Oxygen Re-Evaluation     Row Name 10/05/22 1401 10/23/22 1113 11/23/22 1007 12/18/22 1124 02/22/23 1359     Program Oxygen Prescription   Program Oxygen Prescription -- None None None None     Home Oxygen   Home Oxygen Device -- None None None None  Sleep Oxygen Prescription -- None None None None   Home Exercise Oxygen Prescription -- None None None None   Home Resting Oxygen Prescription -- None None None None     Goals/Expected Outcomes   Short Term Goals -- To learn and understand importance of maintaining oxygen saturations>88%;To learn and demonstrate proper pursed lip breathing techniques or other breathing techniques.  To learn and understand importance of monitoring SPO2 with pulse oximeter and demonstrate accurate use of the pulse oximeter.;To learn and understand importance of maintaining oxygen saturations>88%;To learn and demonstrate proper pursed lip breathing techniques or other breathing techniques.  To learn and understand importance of maintaining oxygen saturations>88%;To learn and understand importance of monitoring SPO2 with pulse oximeter and demonstrate accurate use of the pulse oximeter. To learn and understand importance of maintaining oxygen saturations>88%;To learn and understand importance of monitoring SPO2 with pulse oximeter and demonstrate accurate use of the pulse oximeter.   Long  Term Goals -- Exhibits proper breathing techniques, such as pursed lip breathing or other method taught during program session Verbalizes importance of monitoring SPO2 with pulse oximeter and return demonstration;Maintenance of O2 saturations>88%;Exhibits proper breathing techniques, such as  pursed lip breathing or other method taught during program session Maintenance of O2 saturations>88%;Verbalizes importance of monitoring SPO2 with pulse oximeter and return demonstration Maintenance of O2 saturations>88%;Verbalizes importance of monitoring SPO2 with pulse oximeter and return demonstration   Comments Reviewed PLB technique with pt.  Talked about how it works and it's importance in maintaining their exercise saturations. Informed patient how to perform the Pursed Lipped breathing technique. Told patient to Inhale through the nose and out the mouth with pursed lips to keep their airways open, help oxygenate them better, practice when at rest or doing strenuous activity. Patient Verbalizes understanding of technique and will work on and be reiterated during [name]. Cletis Athens is doing well in rehab.  His saturations are doing well and he has been using PLB to help with breathing. Cletis Athens has a pulse oximeter to check his oxygen saturation at home. Informed and explained why it is important to have one. Reviewed that oxygen saturations should be 88 percent and above. Cletis Athens feels comfortable checking his oxygen at home. He has been using his pulse oximeter at home. He has no current concerns related to his breathing.   Goals/Expected Outcomes Short: Become more profiecient at using PLB.   Long: Become independent at using PLB. Short: use PLB with exertion. Long: use PLB on exertion proficiently and independently. Short: use PLB with exertion. Long: use PLB on exertion proficiently and independently and monitor saturations Short: monitor oxygen at home with exertion. Long: maintain oxygen saturations above 88 percent independently. Short: continue monitoring his oxygen at home with exertion. Long: maintain oxygen saturation management            Oxygen Discharge (Final Oxygen Re-Evaluation):  Oxygen Re-Evaluation - 02/22/23 1359       Program Oxygen Prescription   Program Oxygen Prescription None       Home Oxygen   Home Oxygen Device None    Sleep Oxygen Prescription None    Home Exercise Oxygen Prescription None    Home Resting Oxygen Prescription None      Goals/Expected Outcomes   Short Term Goals To learn and understand importance of maintaining oxygen saturations>88%;To learn and understand importance of monitoring SPO2 with pulse oximeter and demonstrate accurate use of the pulse oximeter.    Long  Term Goals Maintenance of O2 saturations>88%;Verbalizes importance of monitoring  SPO2 with pulse oximeter and return demonstration    Comments Cletis Athens feels comfortable checking his oxygen at home. He has been using his pulse oximeter at home. He has no current concerns related to his breathing.    Goals/Expected Outcomes Short: continue monitoring his oxygen at home with exertion. Long: maintain oxygen saturation management             Initial Exercise Prescription:  Initial Exercise Prescription - 09/30/22 1500       Date of Initial Exercise RX and Referring Provider   Date 09/30/22    Referring Provider Vito Berger MD      Oxygen   Maintain Oxygen Saturation 88% or higher      Treadmill   MPH 3    Grade 1    Minutes 15    METs 3.71      Recumbant Bike   Level 3    Watts 57    Minutes 15    METs 4.24      NuStep   Level 4    Minutes 15    METs 4.24      Prescription Details   Frequency (times per week) 2    Duration Progress to 30 minutes of continuous aerobic without signs/symptoms of physical distress      Intensity   THRR 40-80% of Max Heartrate 92-133    Ratings of Perceived Exertion 11-13    Perceived Dyspnea 0-4      Progression   Progression Continue to progress workloads to maintain intensity without signs/symptoms of physical distress.      Resistance Training   Training Prescription Yes    Weight 4 lb    Reps 10-15             Perform Capillary Blood Glucose checks as needed.  Exercise Prescription Changes:   Exercise  Prescription Changes     Row Name 09/30/22 1500 10/13/22 1400 10/29/22 1300 10/30/22 1100 11/10/22 1500     Response to Exercise   Blood Pressure (Admit) 98/52 108/60 94/60 -- 108/64   Blood Pressure (Exercise) 142/60 124/68 148/62 -- --   Blood Pressure (Exit) 96/58 104/62 102/62 -- 98/56   Heart Rate (Admit) 51 bpm 55 bpm 49 bpm -- 49 bpm   Heart Rate (Exercise) 84 bpm 85 bpm 111 bpm -- 88 bpm   Heart Rate (Exit) 54 bpm 62 bpm 63 bpm -- 59 bpm   Oxygen Saturation (Admit) 97 % 98 % 98 % -- 97 %   Oxygen Saturation (Exercise) 97 % 92 % 95 % -- 93 %   Oxygen Saturation (Exit) 98 % 97 % 96 % -- 96 %   Rating of Perceived Exertion (Exercise) 13 13 14  -- 12   Perceived Dyspnea (Exercise) 0 1 0 -- 0   Symptoms leg tightness slight SOB none -- none   Comments Results 2nd full day of exercise -- -- --   Duration -- Continue with 30 min of aerobic exercise without signs/symptoms of physical distress. Continue with 30 min of aerobic exercise without signs/symptoms of physical distress. -- Continue with 30 min of aerobic exercise without signs/symptoms of physical distress.   Intensity -- THRR unchanged THRR unchanged -- THRR unchanged     Progression   Progression -- Continue to progress workloads to maintain intensity without signs/symptoms of physical distress. Continue to progress workloads to maintain intensity without signs/symptoms of physical distress. -- Continue to progress workloads to maintain intensity without signs/symptoms of physical distress.  Average METs -- 4.84 5.92 -- 6.59     Resistance Training   Training Prescription -- Yes Yes -- Yes   Weight -- 6 lb 6 lb -- 6 lb   Reps -- 10-15 10-15 -- 10-15     Interval Training   Interval Training -- No No -- No     Treadmill   MPH -- 3.8 4 -- 3.7   Grade -- 1 5 -- 5.5   Minutes -- 15 30 -- 15   METs -- 4.43 6.82 -- 6.64     Recumbant Elliptical   Level -- 11 -- -- --   Minutes -- 15 -- -- --   METs -- 6.2 -- -- --      REL-XR   Level -- -- 12 -- 13   Minutes -- -- 15 -- 15   METs -- -- 7.8 -- 9     T5 Nustep   Level -- -- 5 -- --   Minutes -- -- 15 -- --   METs -- -- 4 -- --     Home Exercise Plan   Plans to continue exercise at -- -- -- Home (comment)  walking, working in [employer] (comment)  walking, working in garden   Frequency -- -- -- Add 2 additional days to program exercise sessions. Add 2 additional days to program exercise sessions.   Initial Home Exercises Provided -- -- -- 10/30/22 10/30/22     Oxygen   Maintain Oxygen Saturation -- -- 88% or higher -- 88% or higher    Row Name 11/24/22 0800 12/08/22 1400 12/23/22 1400 01/07/23 0800 01/20/23 1600     Response to Exercise   Blood Pressure (Admit) 112/58 124/70 100/60 130/58 100/58   Blood Pressure (Exit) 108/56 100/54 124/60 102/56 96/56   Heart Rate (Admit) 52 bpm 74 bpm 51 bpm 50 bpm 48 bpm   Heart Rate (Exercise) 81 bpm 100 bpm 93 bpm 98 bpm 104 bpm   Heart Rate (Exit) 65 bpm 69 bpm 59 bpm 70 bpm 69 bpm   Oxygen Saturation (Admit) 96 % 98 % 97 % 96 % 98 %   Oxygen Saturation (Exercise) 95 % 95 % 95 % 98 % 94 %   Oxygen Saturation (Exit) 97 % 96 % 97 % 96 % 95 %   Rating of Perceived Exertion (Exercise) 12 14 14 14 14    Perceived Dyspnea (Exercise) 0 1 0 0 0   Symptoms none none none none none   Duration Continue with 30 min of aerobic exercise without signs/symptoms of physical distress. Continue with 30 min of aerobic exercise without signs/symptoms of physical distress. Continue with 30 min of aerobic exercise without signs/symptoms of physical distress. Continue with 30 min of aerobic exercise without signs/symptoms of physical distress. Continue with 30 min of aerobic exercise without signs/symptoms of physical distress.   Intensity THRR unchanged THRR unchanged THRR unchanged THRR unchanged THRR unchanged     Progression   Progression Continue to progress workloads to maintain intensity without signs/symptoms of  physical distress. Continue to progress workloads to maintain intensity without signs/symptoms of physical distress. Continue to progress workloads to maintain intensity without signs/symptoms of physical distress. Continue to progress workloads to maintain intensity without signs/symptoms of physical distress. Continue to progress workloads to maintain intensity without signs/symptoms of physical distress.   Average METs 4.77 5.66 5.85 7.5 6.96     Resistance Training   Training Prescription Yes Yes Yes Yes Yes   Weight  6 lb 6 lb 6 lb 6lb 6 lb   Reps 10-15 10-15 10-15 10-15 10-15     Interval Training   Interval Training No No No No No     Treadmill   MPH 3.4 3.4  up to 3.8 3.4 3.4 3.8   Grade 2.5 6  up to 6.5 7 5 7    Minutes 30 30 30 30 30    METs 4.77 7.9 6.88 7.35 7.57     NuStep   Level -- 4 -- -- --   Minutes -- 15 -- -- --   METs -- 5.7 -- -- --     T5 Nustep   Level -- -- 4 -- --   Minutes -- -- 30 -- --   METs -- -- 2.2 -- --     Home Exercise Plan   Plans to continue exercise at Home (comment)  walking, working in garden Home (comment)  walking, working in [employer] (comment)  walking, working in garden Home (comment)  walking  working in [employer] (comment)  walking  working in garden   Frequency Add 2 additional days to program exercise sessions. Add 2 additional days to program exercise sessions. Add 2 additional days to program exercise sessions. Add 2 additional days to program exercise sessions. Add 2 additional days to program exercise sessions.   Initial Home Exercises Provided 10/30/22 10/30/22 10/30/22 10/30/22 10/30/22     Oxygen   Maintain Oxygen Saturation 88% or higher 88% or higher 88% or higher 88% or higher 88% or higher    Row Name 02/05/23 0800 02/17/23 0800 03/04/23 1000         Response to Exercise   Blood Pressure (Admit) 102/58 102/60 108/60     Blood Pressure (Exit) 100/62 102/62 114/60     Heart Rate (Admit) 48 bpm 55 bpm 50 bpm      Heart Rate (Exercise) 99 bpm 114 bpm 107 bpm     Heart Rate (Exit) 65 bpm 72 bpm 80 bpm     Oxygen Saturation (Admit) 98 % 98 % 98 %     Oxygen Saturation (Exercise) 95 % 95 % 93 %     Oxygen Saturation (Exit) 96 % 96 % 96 %     Rating of Perceived Exertion (Exercise) 14 14 13      Perceived Dyspnea (Exercise) 0 0 1     Symptoms none none none     Duration Continue with 30 min of aerobic exercise without signs/symptoms of physical distress. Continue with 30 min of aerobic exercise without signs/symptoms of physical distress. Continue with 30 min of aerobic exercise without signs/symptoms of physical distress.     Intensity THRR unchanged THRR unchanged THRR unchanged       Progression   Progression Continue to progress workloads to maintain intensity without signs/symptoms of physical distress. Continue to progress workloads to maintain intensity without signs/symptoms of physical distress. Continue to progress workloads to maintain intensity without signs/symptoms of physical distress.     Average METs 7.9 7.9 7.85       Resistance Training   Training Prescription Yes Yes Yes     Weight 8 lb 8 lb 8 lb     Reps 10-15 10-15 10-15       Interval Training   Interval Training No No No       Treadmill   MPH 3.4 3.4 3.4     Grade 11 10 10      Minutes 30 30 30  METs 8.76 8.29 8.29       NuStep   Level -- 6 --     Minutes -- 15 --       Home Exercise Plan   Plans to continue exercise at Home (comment)  walking  working in garden Home (comment)  walking  working in [employer] (comment)  walking  working in garden     Frequency Add 2 additional days to program exercise sessions. Add 2 additional days to program exercise sessions. Add 2 additional days to program exercise sessions.     Initial Home Exercises Provided 10/30/22 10/30/22 10/30/22       Oxygen   Maintain Oxygen Saturation 88% or higher 88% or higher 88% or higher              Exercise Comments:   Exercise  Comments     Row Name 10/05/22 1401 03/12/23 1140         Exercise Comments First full day of exercise!  Patient was oriented to gym and equipment including functions, settings, policies, and procedures.  Patient's individual exercise prescription and treatment plan were reviewed.  All starting workloads were established based on the results of the 6 minute walk test done at initial orientation visit.  The plan for exercise progression was also introduced and progression will be customized based on patient's performance and goals. Jovan graduated today from  rehab with 35 sessions completed.  Details of the patient's exercise prescription and what He needs to do in order to continue the prescription and progress were discussed with patient.  Patient was given a copy of prescription and goals.  Patient verbalized understanding. Tarig plans to continue to exercise by walking and working in his garden..               Exercise Goals and Review:   Exercise Goals     Row Name 09/30/22 1541             Exercise Goals   Increase Physical Activity Yes       Intervention Provide advice, education, support and counseling about physical activity/exercise needs.;Develop an individualized exercise prescription for aerobic and resistive training based on initial evaluation findings, risk stratification, comorbidities and participant's personal goals.       Expected Outcomes Short Term: Attend rehab on a regular basis to increase amount of physical activity.;Long Term: Add in home exercise to make exercise part of routine and to increase amount of physical activity.;Long Term: Exercising regularly at least 3-5 days a week.       Increase Strength and Stamina Yes       Intervention Provide advice, education, support and counseling about physical activity/exercise needs.;Develop an individualized exercise prescription for aerobic and resistive training based on initial evaluation findings, risk  stratification, comorbidities and participant's personal goals.       Expected Outcomes Short Term: Increase workloads from initial exercise prescription for resistance, speed, and METs.;Short Term: Perform resistance training exercises routinely during rehab and add in resistance training at home;Long Term: Improve cardiorespiratory fitness, muscular endurance and strength as measured by increased METs and functional capacity ( )       Able to understand and use rate of perceived exertion (RPE) scale Yes       Intervention Provide education and explanation on how to use RPE scale       Expected Outcomes Short Term: Able to use RPE daily in rehab to express subjective intensity level;Long Term:  Able to use  RPE to guide intensity level when exercising independently       Able to understand and use Dyspnea scale Yes       Intervention Provide education and explanation on how to use Dyspnea scale       Expected Outcomes Short Term: Able to use Dyspnea scale daily in rehab to express subjective sense of shortness of breath during exertion;Long Term: Able to use Dyspnea scale to guide intensity level when exercising independently       Knowledge and understanding of Target Heart Rate Range (THRR) Yes       Intervention Provide education and explanation of THRR including how the numbers were predicted and where they are located for reference       Expected Outcomes Short Term: Able to state/look up THRR;Short Term: Able to use daily as guideline for intensity in rehab;Long Term: Able to use THRR to govern intensity when exercising independently       Able to check pulse independently Yes       Intervention Provide education and demonstration on how to check pulse in carotid and radial arteries.;Review the importance of being able to check your own pulse for safety during independent exercise       Expected Outcomes Short Term: Able to explain why pulse checking is important during independent  exercise;Long Term: Able to check pulse independently and accurately       Understanding of Exercise Prescription Yes       Intervention Provide education, explanation, and written materials on patient's individual exercise prescription       Expected Outcomes Short Term: Able to explain program exercise prescription;Long Term: Able to explain home exercise prescription to exercise independently                Exercise Goals Re-Evaluation :  Exercise Goals Re-Evaluation     Row Name 10/05/22 1402 10/13/22 1451 10/29/22 1340 10/30/22 1116 11/10/22 1511     Exercise Goal Re-Evaluation   Exercise Goals Review Increase Physical Activity;Able to understand and use rate of perceived exertion (RPE) scale;Knowledge and understanding of Target Heart Rate Range (THRR);Understanding of Exercise Prescription;Increase Strength and Stamina;Able to check pulse independently Increase Physical Activity;Increase Strength and Stamina;Understanding of Exercise Prescription Increase Physical Activity;Increase Strength and Stamina;Understanding of Exercise Prescription Increase Physical Activity;Increase Strength and Stamina;Able to understand and use rate of perceived exertion (RPE) scale;Able to understand and use Dyspnea scale;Knowledge and understanding of Target Heart Rate Range (THRR);Able to check pulse independently;Understanding of Exercise Prescription Increase Physical Activity;Increase Strength and Stamina;Understanding of Exercise Prescription   Comments Reviewed RPE scale, THR and program prescription with pt today.  Pt voiced understanding and was given a copy of goals to take home. Nicola Girt is off to a good start with the program. He is already working at pretty significant high loads, around level 11 on the XR and up to 3.8 mph speed on the treadmill, all with appropriate RPEs. We will continue to monitor his progress as he furthers in the program. Cletis Athens is doing well in rehab. He recently increased his  overall average MET level to 5.92 METs. He also increased his treadmill workload to 4 mph with an incline of 5%, and he walked at this pace for 30 minutes. He improved to level 12 on the XR and level 5 on the T5 nustep as well. We will continue to monitor his progress in the program. Reviewed home exercise with pt today.  Pt plans to add in some walking in the  garden for exercise. Reviewed THR, pulse, RPE, sign and symptoms, pulse oximetery and when to call 911 or MD.  Also discussed weather considerations and indoor options.  Pt voiced understanding. We talked about the importance of adding in home exerise. Khalin has been doing well in rehab. He has increased to a 3.7/5.5% treadmill workload which he completes for 30 minutes at a time.  He increased to level 13 on the XR, working over 9 METS!  His oxygen is staying well above 88%. We will continue to monitor.   Expected Outcomes Short: Use RPE daily to regulate intensity.  Long: Follow program prescription in THR. Short: Follow current exercise prescription, progress workloads slowly and gradually Long: Increase overall strength and stamina Short: Continue to progressively increase workloads. Long: Continue to improve strength and stamina. Short: Add in some exercise at home Long: Conitnue to improve stamina Short: Add in intervals to regimen Long: Continue to increase overall MET level and stamina    Row Name 11/23/22 0959 11/24/22 0808 12/08/22 1433 12/23/22 1424 01/07/23 0826     Exercise Goal Re-Evaluation   Exercise Goals Review Increase Physical Activity;Increase Strength and Stamina;Understanding of Exercise Prescription Increase Physical Activity;Increase Strength and Stamina;Understanding of Exercise Prescription Increase Physical Activity;Increase Strength and Stamina;Understanding of Exercise Prescription Increase Physical Activity;Increase Strength and Stamina;Understanding of Exercise Prescription Increase Physical Activity;Increase Strength  and Stamina;Understanding of Exercise Prescription   Comments Cletis Athens returned to rehab today.  He has been staying active by working in the garden while he was out.  He is happy to be back and ready to get back to exercise again. Cletis Athens has only attended rehab once since the last review. During that one session he walked on the treadmill for 30 minutes at a speed of 3.4 mph with an incline of 2.5%. He also has continued to use 6 lb hand weights for resistance training. We will continue to monitor his progress in the program. Cletis Athens continues to do well in rehab. He prefers to walk on the treadmill and continues to use that as his primary mode of exercise. Staff will continue to encourage patient to use a variety of machines. He has increased his speed on the treadmill, varying between 3.4-3.8 mph with a range of 2.5-6% incline. It does not appear that he is completing intervals. His RPEs remain in appropriate range. Will continue to monitor. Cletis Athens continues to do well in rehab. He has consistently walked the treadmill for 30 minutes at a speed of 3.4 mph with incline ranging from 6 to 7%. He also continues to work on the T5 nustep at level 4 and use 6 lb hand weights for resistance training as well. We will continue to monitor his progress in the program. JOn conmtinues to do well with his exercise progression. He has one time done 7/5% on TM. He has stayed steady eith 3.5/8% other days. He continues with 6lb hand weights.  We will continue to monitor his progress in the program.   Expected Outcomes Short: Continue to return to routine exercise Long: conitnue to improve stamina Short: Attend rehab regularly. Long: Continue to improve strength and stamina. Short: Keep consistent workloads on treadmill, try different seated machines Long: Continue to increase overall MET level and stamina Short: Keep consistent workloads on treadmill, try different seated machines. Long: Continue to increase overall MET level and stamina. STG:  Work on other equipment for variation. LTG COntinued progression with exercise    Row Name 01/20/23 1624 02/05/23 0818 02/17/23 0856 02/22/23  1357 03/04/23 1009     Exercise Goal Re-Evaluation   Exercise Goals Review Increase Physical Activity;Increase Strength and Stamina;Understanding of Exercise Prescription Increase Physical Activity;Increase Strength and Stamina;Understanding of Exercise Prescription Increase Physical Activity;Increase Strength and Stamina;Understanding of Exercise Prescription Knowledge and understanding of Target Heart Rate Range (THRR);Able to understand and use rate of perceived exertion (RPE) scale;Able to check pulse independently;Able to understand and use Dyspnea scale;Understanding of Exercise Prescription Increase Physical Activity;Increase Strength and Stamina;Knowledge and understanding of Target Heart Rate Range (THRR)   Comments Cletis Athens is doing well in the program. He continues to walk the treadmill for 30 minutes but did increase his workload to a speed of 3.8 mph with an incline of 7%. He also continues to use 6 lb hand weights for resistance training as well. We will continue to monitor his progress. Cletis Athens is doing well in the program. He continues to walk the treadmill for 30 minutes, but did increase his workload by increasing his incline to 11% and maintaining a speed of 3.4 mph. He also improved to 8 lb hand weights for resistance training as well. We will continue to monitor his progress. Cletis Athens is doing well in the program. He continues to walk the treadmill for 30 minutes, and has kept his workload consistent. He did increase from level 4 to level 6 on the T4 nustep. He is due for his post and will look to improve on it. We will continue to monitor his progress. Cletis Athens is set to graduate in a few more sessions. He feels ready to exercise on his own and feels comfortable with the RPE and Dyspnea scale and modifying his exercise as necessary. He has achieved his goals on  the treadmill and is looking into buying himself one so he can continue walking in the winter. During the fall, he plans on walking outside around his property. Cletis Athens is doing well in the program and is close to graduting. He recently completed his post and improved by 17.1%! He also continues to walk the treadmill at a speed of 3.4 mph with an incline of 10%. He has done well with 8 lb hand weights for resistance training as well. We will continue to monitor his progress in the program.   Expected Outcomes Short: Try other equipment for variation. Long: Continue to improve strength and stamina. Short: Try other equipment for variation. Long: Continue to improve strength and stamina. Short: Improve on post . Long: Continue exercise to improve strength and stamina. Short: graduate from pulmonary rehab. Long: independently maintain an exercise routine Short: Graduate. Long: Continue to exercise independently.            Discharge Exercise Prescription (Final Exercise Prescription Changes):  Exercise Prescription Changes - 03/04/23 1000       Response to Exercise   Blood Pressure (Admit) 108/60    Blood Pressure (Exit) 114/60    Heart Rate (Admit) 50 bpm    Heart Rate (Exercise) 107 bpm    Heart Rate (Exit) 80 bpm    Oxygen Saturation (Admit) 98 %    Oxygen Saturation (Exercise) 93 %    Oxygen Saturation (Exit) 96 %    Rating of Perceived Exertion (Exercise) 13    Perceived Dyspnea (Exercise) 1    Symptoms none    Duration Continue with 30 min of aerobic exercise without signs/symptoms of physical distress.    Intensity THRR unchanged      Progression   Progression Continue to progress workloads to maintain  intensity without signs/symptoms of physical distress.    Average METs 7.85      Resistance Training   Training Prescription Yes    Weight 8 lb    Reps 10-15      Interval Training   Interval Training No      Treadmill   MPH 3.4    Grade 10    Minutes 30    METs  8.29      Home Exercise Plan   Plans to continue exercise at Home (comment)   walking  working in garden   Frequency Add 2 additional days to program exercise sessions.    Initial Home Exercises Provided 10/30/22      Oxygen   Maintain Oxygen Saturation 88% or higher             Nutrition:  Target Goals: Understanding of nutrition guidelines, daily intake of sodium 1500mg , cholesterol 200mg , calories 30% from fat and 7% or less from saturated fats, daily to have 5 or more servings of fruits and vegetables.  Education: All About Nutrition: -Group instruction provided by verbal, written material, interactive activities, discussions, models, and posters to present general guidelines for heart healthy nutrition including fat, fiber, MyPlate, the role of sodium in heart healthy nutrition, utilization of the nutrition label, and utilization of this knowledge for meal planning. Follow up email sent as well. Written material given at graduation.   Biometrics:  Pre Biometrics - 09/30/22 1542       Pre Biometrics   Height 6' 2.25" (1.886 m)    Weight 176 lb 14.4 oz (80.2 kg)    Waist Circumference 34 inches    Hip Circumference 40 inches    Waist to Hip Ratio 0.85 %    BMI (Calculated) 22.56    Single Leg Stand 30 seconds             Post Biometrics - 02/15/23 1130        Post  Biometrics   Height 6' 2.25" (1.886 m)    Weight 182 lb 6.4 oz (82.7 kg)    Waist Circumference 36 inches    Hip Circumference 40 inches    Waist to Hip Ratio 0.9 %    BMI (Calculated) 23.26    Single Leg Stand 30 seconds             Nutrition Therapy Plan and Nutrition Goals:  Nutrition Therapy & Goals - 09/30/22 1520       Intervention Plan   Intervention Prescribe, educate and counsel regarding individualized specific dietary modifications aiming towards targeted core components such as weight, hypertension, lipid management, diabetes, heart failure and other comorbidities.     Expected Outcomes Short Term Goal: Understand basic principles of dietary content, such as calories, fat, sodium, cholesterol and nutrients.;Short Term Goal: A plan has been developed with personal nutrition goals set during dietitian appointment.;Long Term Goal: Adherence to prescribed nutrition plan.             Nutrition Assessments:  MEDIFICTS Score Key: ?70 Need to make dietary changes  40-70 Heart Healthy Diet ? 40 Therapeutic Level Cholesterol Diet  Flowsheet Row Pulmonary Rehab from 03/01/2023 in [facility]  Picture Your Plate Total Score on Discharge 51      Picture Your Plate Scores: <64 Unhealthy dietary pattern with much room for improvement. 41-50 Dietary pattern unlikely to meet recommendations for good health and room for improvement. 51-60 More healthful dietary pattern, with some room for improvement.  >  60 Healthy dietary pattern, although there may be some specific behaviors that could be improved.   Nutrition Goals Re-Evaluation:  Nutrition Goals Re-Evaluation     Row Name 10/23/22 1120 11/23/22 1003 12/18/22 1125         Goals   Current Weight 181 lb (82.1 kg) -- 180 lb (81.6 kg)     Nutrition Goal Eat smaller portions. Short: Choose and plan snacks accordingly to patients caloric intake to improve breathing. Long: Maintain a diet independently that meets their caloric intake to aid in daily shortness of breath. --     Comment Patient was informed on why it is important to maintain a balanced diet when dealing with Respiratory issues. Explained that it takes a lot of energy to breath and when they are short of breath often they will need to have a good diet to help keep up with the calories they are expending for breathing. Jon conintues to work on his diet.  He feels that he is doing pretty good with his diet.  He continues to make changes and trying to find healthier options.  We talked about staying away from salt with heart history as  well. Patient would not like to meet with the dietician. Will follow up next review.     Expected Outcome Short: Choose and plan snacks accordingly to patients caloric intake to improve breathing. Long: Maintain a diet independently that meets their caloric intake to aid in daily shortness of breath. Short; conitnue to make healthier choices Long: Conitnue to improve balance --              Nutrition Goals Discharge (Final Nutrition Goals Re-Evaluation):  Nutrition Goals Re-Evaluation - 12/18/22 1125       Goals   Current Weight 180 lb (81.6 kg)    Comment Patient would not like to meet with the dietician. Will follow up next review.             Psychosocial: Target Goals: Acknowledge presence or absence of significant depression and/or stress, maximize coping skills, provide positive support system. Participant is able to verbalize types and ability to use techniques and skills needed for reducing stress and depression.   Education: Stress, Anxiety, and Depression - Group verbal and visual presentation to define topics covered.  Reviews how body is impacted by stress, anxiety, and depression.  Also discusses healthy ways to reduce stress and to treat/manage anxiety and depression.  Written material given at graduation.   Education: Sleep Hygiene -Provides group verbal and written instruction about how sleep can affect your health.  Define sleep hygiene, discuss sleep cycles and impact of sleep habits. Review good sleep hygiene tips.    Initial Review & Psychosocial Screening:  Initial Psych Review & Screening - 09/28/22 1120       Initial Review   Current issues with Current Stress Concerns    Comments He can look to his Wife, kids and grandkids for support. He is having more shortness of breath than usual and wants to get that under control. He has done with Cardiac Rehab program in 2020.      Family Dynamics   Good Support System? Yes      Barriers   Psychosocial  barriers to participate in program The patient should benefit from training in stress management and relaxation.      Screening Interventions   Interventions Encouraged to exercise;To provide support and resources with identified psychosocial needs;Program counselor consult;Provide feedback about the scores to participant  Expected Outcomes Short Term goal: Utilizing psychosocial counselor, staff and physician to assist with identification of specific Stressors or current issues interfering with healing process. Setting desired goal for each stressor or current issue identified.;Long Term Goal: Stressors or current issues are controlled or eliminated.;Short Term goal: Identification and review with participant of any Quality of Life or Depression concerns found by scoring the questionnaire.;Long Term goal: The participant improves quality of Life and PHQ9 Scores as seen by post scores and/or verbalization of changes             Quality of Life Scores:  Scores of 19 and below usually indicate a poorer quality of life in these areas.  A difference of  2-3 points is a clinically meaningful difference.  A difference of 2-3 points in the total score of the Quality of Life Index has been associated with significant improvement in overall quality of life, self-image, physical symptoms, and general health in studies assessing change in quality of life.  PHQ-9: Review Flowsheet  More data may exist      03/01/2023 10/23/2022 09/30/2022 10/23/2019 05/01/2019  Depression screen PHQ 2/9  Decreased Interest 0 0 0 0 0  Down, Depressed, Hopeless 0 0 0 0 0  PHQ - 2 Score 0 0 0 0 0  Altered sleeping 0 0 0 0 0  Tired, decreased energy 2 3 3 1 1   Change in appetite 0 0 2 0 1  Feeling bad or failure about yourself  0 0 0 0 0  Trouble concentrating 0 0 0 0 0  Moving slowly or fidgety/restless 0 0 0 0 0  Suicidal thoughts 0 0 0 0 0  PHQ-9 Score 2 3 5 1 2   Difficult doing work/chores - Somewhat difficult Very  difficult Somewhat difficult Somewhat difficult    Details           Interpretation of Total Score  Total Score Depression Severity:  1-4 = Minimal depression, 5-9 = Mild depression, 10-14 = Moderate depression, 15-19 = Moderately severe depression, 20-27 = Severe depression   Psychosocial Evaluation and Intervention:  Psychosocial Evaluation - 09/28/22 1121       Psychosocial Evaluation & Interventions   Interventions Encouraged to exercise with the program and follow exercise prescription;Relaxation education;Stress management education    Comments He can look to his Wife, kids and grandkids for support. He is having more shortness of breath than usual and wants to get that under control. He has done with Cardiac Rehab program in 2020.    Expected Outcomes Short: Start [name] to help with mood. Long: Maintain a healthy mental state.    Continue Psychosocial Services  Follow up required by staff             Psychosocial Re-Evaluation:  Psychosocial Re-Evaluation     Row Name 10/23/22 1117 11/23/22 1001 12/18/22 1127 02/22/23 1351       Psychosocial Re-Evaluation   Current issues with Current Stress Concerns Current Stress Concerns None Identified None Identified    Comments Reviewed patient health questionnaire (PHQ-9) with patient for follow up. Previously, patients score indicated signs/symptoms of depression.  Reviewed to see if patient is improving symptom wise while in program.  Score improved and patient states that it is because he has been able to increase his exercise level. Cletis Athens has been out with a heart workup after a nuclear stress test, but echo found everything to be normal, so he is pleased with that. He is feeling pretty good otherwise  and just meet things where they are and tackles the daily tasks.  He tries to not get too stressed.  He is sleeping well for most part. Patient reports no issues with their current mental states, sleep, stress, depression or  anxiety. Will follow up with patient in a few weeks for any changes. Cletis Athens is ready to graduate from pulmonary rehab. He is proud of the progress he has made during his time in the program. He has been walking faster and on more incline than when he started. He wants to get himself a treadmill for home since the closest gym is over 20 minutes away. He is getting ready for hunting season which will keep him very busy and active. He enjoys spending time with his family and is happy that his son now lives close by so he gets to see his grandchildren more often. He enjoys taking them fishing and playing outside with them. They plan to welcome their third grandchild this December. He is motivated to stay on track with his health and is thankful for the program    Expected Outcomes Short: Continue to attend [name] regularly for regular exercise and social engagement. Long: Continue to improve symptoms and manage a positive mental state. Short; Return to routine exercise for mental boost Long: Continue to stay positive Short: Continue to exercise regularly to support mental health and notify staff of any changes. Long: maintain mental health and well being through teaching of rehab or prescribed medications independently. Short: graduate from pulmonary rehab. Long: Maintain positive mental health habits    Interventions Encouraged to attend Pulmonary Rehabilitation for the exercise Encouraged to attend Pulmonary Rehabilitation for the exercise Encouraged to attend Pulmonary Rehabilitation for the exercise --    Continue Psychosocial Services  Follow up required by staff -- Follow up required by staff --             Psychosocial Discharge (Final Psychosocial Re-Evaluation):  Psychosocial Re-Evaluation - 02/22/23 1351       Psychosocial Re-Evaluation   Current issues with None Identified    Comments Cletis Athens is ready to graduate from pulmonary rehab. He is proud of the progress he has made during his time in the  program. He has been walking faster and on more incline than when he started. He wants to get himself a treadmill for home since the closest gym is over 20 minutes away. He is getting ready for hunting season which will keep him very busy and active. He enjoys spending time with his family and is happy that his son now lives close by so he gets to see his grandchildren more often. He enjoys taking them fishing and playing outside with them. They plan to welcome their third grandchild this December. He is motivated to stay on track with his health and is thankful for the program    Expected Outcomes Short: graduate from pulmonary rehab. Long: Maintain positive mental health habits             Education: Education Goals: Education classes will be provided on a weekly basis, covering required topics. Participant will state understanding/return demonstration of topics presented.  Learning Barriers/Preferences:  Learning Barriers/Preferences - 09/28/22 1116       Learning Barriers/Preferences   Learning Barriers None    Learning Preferences Individual Instruction             General Pulmonary Education Topics:  Infection Prevention: - Provides verbal and written material to individual with discussion of infection control  including proper hand washing and proper equipment cleaning during exercise session. Flowsheet Row Pulmonary Rehab from 09/30/2022 in [facility] Cardiac and Pulmonary Rehab  Date 09/28/22  Educator [facility]  Instruction Review Code 1- Verbalizes Understanding       Falls Prevention: - Provides verbal and written material to individual with discussion of falls prevention and safety. Flowsheet Row Pulmonary Rehab from 09/30/2022 in [facility] Cardiac and Pulmonary Rehab  Date 09/28/22  Educator [facility]  Instruction Review Code 1- Verbalizes Understanding       Chronic Lung Disease Review: - Group verbal instruction with posters, models, PowerPoint presentations and videos,  to  review new updates, new respiratory medications, new advancements in procedures and treatments. Providing information on websites and "800" numbers for continued self-education. Includes information about supplement oxygen, available portable oxygen systems, continuous and intermittent flow rates, oxygen safety, concentrators, and Medicare reimbursement for oxygen. Explanation of Pulmonary Drugs, including class, frequency, complications, importance of spacers, rinsing mouth after steroid MDI's, and proper cleaning methods for nebulizers. Review of basic lung anatomy and physiology related to function, structure, and complications of lung disease. Review of risk factors. Discussion about methods for diagnosing sleep apnea and types of masks and machines for OSA. Includes a review of the use of types of environmental controls: home humidity, furnaces, filters, dust mite/pet prevention, HEPA vacuums. Discussion about weather changes, air quality and the benefits of nasal washing. Instruction on Warning signs, infection symptoms, calling MD promptly, preventive modes, and value of vaccinations. Review of effective airway clearance, coughing and/or vibration techniques. Emphasizing that all should Create an Action Plan. Written material given at graduation.   AED/CPR: - Group verbal and written instruction with the use of models to demonstrate the basic use of the AED with the basic ABC's of resuscitation.    Anatomy and Cardiac Procedures: - Group verbal and visual presentation and models provide information about basic cardiac anatomy and function. Reviews the testing methods done to diagnose heart disease and the outcomes of the test results. Describes the treatment choices: Medical Management, Angioplasty, or Coronary Bypass Surgery for treating various heart conditions including Myocardial Infarction, Angina, Valve Disease, and Cardiac Arrhythmias.  Written material given at graduation.   Medication  Safety: - Group verbal and visual instruction to review commonly prescribed medications for heart and lung disease. Reviews the medication, class of the drug, and side effects. Includes the steps to properly store meds and maintain the prescription regimen.  Written material given at graduation.   Other: -Provides group and verbal instruction on various topics (see comments)   Knowledge Questionnaire Score:  Knowledge Questionnaire Score - 03/01/23 1152       Knowledge Questionnaire Score   Pre Score 15/18    Post Score 17/18              Core Components/Risk Factors/Patient Goals at Admission:  Personal Goals and Risk Factors at Admission - 09/28/22 1118       Core Components/Risk Factors/Patient Goals on Admission    Weight Management Yes;Weight Maintenance;Weight Gain    Intervention Weight Management: Develop a combined nutrition and exercise program designed to reach desired caloric intake, while maintaining appropriate intake of nutrient and fiber, sodium and fats, and appropriate energy expenditure required for the weight goal.;Weight Management: Provide education and appropriate resources to help participant work on and attain dietary goals.;Weight Management/Obesity: Establish reasonable short term and long term weight goals.    Expected Outcomes Short Term: Continue to assess and modify interventions until short term  weight is achieved;Long Term: Adherence to nutrition and physical activity/exercise program aimed toward attainment of established weight goal;Weight Maintenance: Understanding of the daily nutrition guidelines, which includes 25-35% calories from fat, 7% or less cal from saturated fats, less than 200mg  cholesterol, less than 1.5gm of sodium, & 5 or more servings of fruits and vegetables daily;Understanding recommendations for meals to include 15-35% energy as protein, 25-35% energy from fat, 35-60% energy from carbohydrates, less than 200mg  of dietary cholesterol,  20-35 gm of total fiber daily;Understanding of distribution of calorie intake throughout the day with the consumption of 4-5 meals/snacks;Weight Gain: Understanding of general recommendations for a high calorie, high protein meal plan that promotes weight gain by distributing calorie intake throughout the day with the consumption for 4-5 meals, snacks, and/or supplements    Tobacco Cessation Yes    Intervention Assist the participant in steps to quit. Provide individualized education and counseling about committing to Tobacco Cessation, relapse prevention, and pharmacological support that can be provided by physician.;Education officer, environmental, assist with locating and accessing local/national Quit Smoking programs, and support quit date choice.    Expected Outcomes Short Term: Will demonstrate readiness to quit, by selecting a quit date.;Short Term: Will quit all tobacco product use, adhering to prevention of relapse plan.;Long Term: Complete abstinence from all tobacco products for at least 12 months from quit date.    Improve shortness of breath with ADL's Yes    Intervention Provide education, individualized exercise plan and daily activity instruction to help decrease symptoms of SOB with activities of daily living.    Expected Outcomes Short Term: Improve cardiorespiratory fitness to achieve a reduction of symptoms when performing ADLs;Long Term: Be able to perform more ADLs without symptoms or delay the onset of symptoms    Hypertension Yes    Intervention Provide education on lifestyle modifcations including regular physical activity/exercise, weight management, moderate sodium restriction and increased consumption of fresh fruit, vegetables, and low fat dairy, alcohol moderation, and smoking cessation.;Monitor prescription use compliance.    Expected Outcomes Short Term: Continued assessment and intervention until BP is < 140/3mm HG in hypertensive participants. < 130/30mm HG in hypertensive  participants with diabetes, heart failure or chronic kidney disease.;Long Term: Maintenance of blood pressure at goal levels.    Lipids Yes    Intervention Provide education and support for participant on nutrition & aerobic/resistive exercise along with prescribed medications to achieve LDL 70mg , HDL >40mg .    Expected Outcomes Short Term: Participant states understanding of desired cholesterol values and is compliant with medications prescribed. Participant is following exercise prescription and nutrition guidelines.;Long Term: Cholesterol controlled with medications as prescribed, with individualized exercise RX and with personalized nutrition plan. Value goals: LDL < 70mg , HDL > 40 mg.             Education:Diabetes - Individual verbal and written instruction to review signs/symptoms of diabetes, desired ranges of glucose level fasting, after meals and with exercise. Acknowledge that pre and post exercise glucose checks will be done for 3 sessions at entry of program.   Know Your Numbers and Heart Failure: - Group verbal and visual instruction to discuss disease risk factors for cardiac and pulmonary disease and treatment options.  Reviews associated critical values for Overweight/Obesity, Hypertension, Cholesterol, and Diabetes.  Discusses basics of heart failure: signs/symptoms and treatments.  Introduces Heart Failure Zone chart for action plan for heart failure.  Written material given at graduation. Flowsheet Row Pulmonary Rehab from 09/30/2022 in [facility] Cardiac and Pulmonary Rehab  Education need identified 09/30/22       Core Components/Risk Factors/Patient Goals Review:   Goals and Risk Factor Review     Row Name 10/23/22 1115 11/23/22 1004 12/18/22 1128 02/22/23 1349       Core Components/Risk Factors/Patient Goals Review   Personal Goals Review Improve shortness of breath with ADL's Improve shortness of breath with ADL's;Tobacco Cessation;Weight  Management/Obesity;Hypertension Tobacco Cessation Hypertension;Tobacco Cessation    Review Spoke to patient about their shortness of breath and what they can do to improve. Patient has been informed of breathing techniques when starting the program. Patient is informed to tell staff if they have had any med changes and that certain meds they are taking or not taking can be causing shortness of breath. Cletis Athens is doing well in rehab. His weight is staying steady for most part.  His breathing is doing well and he feels he is doing well.  He is still smoking a pack each day with no plans to quit.  His pressures are doing well.  He is feeling pretty good overall.  He had some acupuncture to help with plantar facsitis and has more testing coming up next week. Cletis Athens has been cutting back on smoking. He is cuting back and telling himself to wait a little longer until the next one. He is trying to quit but putting off smoking and fighting his urges. He is not interested in taking medications to help him quit. Cletis Athens has been doing well in cardiac rehab. He has a few more sessions before he graduates. His blood pressure has been stable and he stays on top of it at home. He is still working on quitting smoking and is proud of the progress he has made. He is compliant with taking his medications and plans to keep up his hard work after graduation    Expected Outcomes Short: Attend [name] regularly to improve shortness of breath with ADL's. Long: maintain independence with ADL's Short: Keep [name] updated with testing.  Long: Conitnue to work on breathing Short: reduce tobacco use. Long: Be tobacco free. Short: graduate from pulmonary rehab. Long: maintain independence in managing risk factors             Core Components/Risk Factors/Patient Goals at Discharge (Final Review):   Goals and Risk Factor Review - 02/22/23 1349       Core Components/Risk Factors/Patient Goals Review   Personal Goals Review Hypertension;Tobacco  Cessation    Review Cletis Athens has been doing well in cardiac rehab. He has a few more sessions before he graduates. His blood pressure has been stable and he stays on top of it at home. He is still working on quitting smoking and is proud of the progress he has made. He is compliant with taking his medications and plans to keep up his hard work after graduation    Expected Outcomes Short: graduate from pulmonary rehab. Long: maintain independence in managing risk factors             ITP Comments:  ITP Comments     Row Name 09/28/22 1115 09/30/22 1516 10/05/22 1401 10/07/22 0825 11/04/22 5284   ITP Comments Virtual Visit completed. Patient informed on EP and RD appointment and 6 Minute walk test. Patient also informed of patient health questionnaires on My Chart. Patient Verbalizes understanding. Visit diagnosis can be found in [facility] media,patient is [place]. Completed and gym orientation. Initial ITP created and sent for review to Dr. Vida Rigger, Medical Director. First full day of  exercise!  Patient was oriented to gym and equipment including functions, settings, policies, and procedures.  Patient's individual exercise prescription and treatment plan were reviewed.  All starting workloads were established based on the results of the 6 minute walk test done at initial orientation visit.  The plan for exercise progression was also introduced and progression will be customized based on patient's performance and goals. 30 Day review completed. Medical Director ITP review done, changes made as directed, and signed approval by Medical Director.     new to program 30 day review completed. ITP sent to Dr. Jinny Sanders, Medical Director of  Pulmonary Rehab. Continue with ITP unless changes are made by physician.    Row Name 11/23/22 5784 12/02/22 0839 12/30/22 0930 01/26/23 1426 02/24/23 1139   ITP Comments Jon returned to rehab today after heart workup. 30 Day review completed. Medical Director ITP review  done, changes made as directed, and signed approval by Medical Director. 30 Day review completed. Medical Director ITP review done, changes made as directed, and signed approval by Medical Director. 30 Day review completed. Medical Director ITP review done, changes made as directed, and signed approval by Medical Director. 30 Day review completed. Medical Director ITP review done, changes made as directed, and signed approval by Medical Director.    Row Name 03/12/23 1140           ITP Comments Jarmal graduated today from  rehab with 35 sessions completed.  Details of the patient's exercise prescription and what He needs to do in order to continue the prescription and progress were discussed with patient.  Patient was given a copy of prescription and goals.  Patient verbalized understanding. Fain plans to continue to exercise by walking and working in his garden..                Comments: Discharge ITP

## 2023-03-12 NOTE — Progress Notes (Signed)
Discharge Note for  Brian Mcclure     1956/02/09         Brian Mcclure graduated today from  rehab with 35 sessions completed.  Details of the patient's exercise prescription and what He needs to do in order to continue the prescription and progress were discussed with patient.  Patient was given a copy of prescription and goals.  Patient verbalized understanding. [name] plans to continue to exercise by walking and working in his garden..    6 Minute Walk     Row Name 09/30/22 1537 02/15/23 1124       6 Minute Walk   Phase Initial Discharge    Distance 1730 feet 2025 feet    Distance % Change -- 17.1 %    Distance Feet Change -- 295 ft    Walk Time 6 minutes 6 minutes    # of Rest Breaks 0 0    MPH 3.28 3.84    METS 4.24 4.87    RPE 13 15    Perceived Dyspnea  0 1    VO2 Peak 14.84 17.05    Symptoms Yes (comment) Yes (comment)    Comments leg tightness calf cramps    Resting HR 51 bpm 49 bpm    Resting BP 98/52 120/70    Resting Oxygen Saturation  97 % 97 %    Exercise Oxygen Saturation  during 6 min walk 98 % 98 %    Max Ex. HR 84 bpm 100 bpm    Max Ex. BP 142/60 142/62    2 Minute Post BP 96/58 124/62      Interval HR   1 Minute HR 65 78    2 Minute HR 78 91    3 Minute HR 77 93    4 Minute HR 82 93    5 Minute HR 82 96    6 Minute HR 84 100    2 Minute Post HR 54 65    Interval Heart Rate? Yes Yes      Interval Oxygen   Interval Oxygen? Yes Yes    Baseline Oxygen Saturation % 97 % 97 %    1 Minute Oxygen Saturation % 97 % 98 %    1 Minute Liters of Oxygen 0 L  RA 0 L    2 Minute Oxygen Saturation % 97 % 98 %    2 Minute Liters of Oxygen 0 L  RA 0 L    3 Minute Oxygen Saturation % 97 % 98 %    3 Minute Liters of Oxygen 0 L  RA 0 L    4 Minute Oxygen Saturation % 98 % 98 %    4 Minute Liters of Oxygen 0 L  RA 0 L    5 Minute Oxygen Saturation % 98 % 98 %    5 Minute Liters of Oxygen 0 L  RA 0 L    6 Minute Oxygen Saturation % 97 % 98 %    6 Minute Liters of  Oxygen 0 L  RA 0 L    2 Minute Post Oxygen Saturation % 98 % 99 %    2 Minute Post Liters of Oxygen 0 L  RA 0 L               Thank you for the referral.

## 2023-03-12 NOTE — Progress Notes (Signed)
Daily Session Note  Patient Details  Name: Brian Mcclure MRN: [id number] Date of Birth: 18-Feb-1956 Referring Provider:   Flowsheet Row Pulmonary Rehab from 09/30/2022 in [facility] Cardiac and Pulmonary Rehab  Referring Provider Vito Berger MD       Encounter Date: 03/12/2023  Check In:  Session Check In - 03/12/23 1138       Check-In   Supervising physician immediately available to respond to emergencies See telemetry face sheet for immediately available ER MD    Location [facility]-Cardiac & Pulmonary Rehab    Staff Present Cora Collum, RN, BSN, CCRP;Noah Tickle, BS, Exercise Physiologist;Joseph Cowen, [place]    Virtual Visit No    Medication changes reported     No    Fall or balance concerns reported    No    Tobacco Cessation Use Increase    Current number of cigarettes/nicotine per day     15    Warm-up and Cool-down Performed on first and last piece of equipment    Resistance Training Performed Yes    VAD Patient? No    PAD/SET Patient? No      Pain Assessment   Currently in Pain? No/denies                Social History   Tobacco Use  Smoking Status Every Day   Current packs/day: 1.00   Average packs/day: 1 pack/day for 40.0 years (40.0 ttl pk-yrs)   Types: Cigarettes  Smokeless Tobacco Never    Goals Met:  Proper associated with RPD/PD & O2 Sat Independence with exercise equipment Exercise tolerated well Personal goals reviewed No report of concerns or symptoms today  Goals Unmet:  Not Applicable  Comments:  Erza graduated today from  rehab with 35 sessions completed.  Details of the patient's exercise prescription and what He needs to do in order to continue the prescription and progress were discussed with patient.  Patient was given a copy of prescription and goals.  Patient verbalized understanding. Brian Mcclure plans to continue to exercise by walking and working in his garden..    Dr. Bethann Punches is Medical Director for [facility]  Cardiac Rehabilitation.  Dr. Vida Rigger is Medical Director for [facility] Pulmonary Rehabilitation.

## 2023-08-12 ENCOUNTER — Other Ambulatory Visit: Payer: Self-pay | Admitting: Physician Assistant

## 2023-08-12 DIAGNOSIS — J3489 Other specified disorders of nose and nasal sinuses: Secondary | ICD-10-CM

## 2023-08-20 ENCOUNTER — Ambulatory Visit
Admission: RE | Admit: 2023-08-20 | Discharge: 2023-08-20 | Disposition: A | Discharge status: Home / Self Care | Payer: No Typology Code available for payment source | Source: Ambulatory Visit | Attending: Physician Assistant | Admitting: Physician Assistant

## 2023-08-20 DIAGNOSIS — J3489 Other specified disorders of nose and nasal sinuses: Secondary | ICD-10-CM | POA: Insufficient documentation

## 2023-08-24 ENCOUNTER — Other Ambulatory Visit: Payer: Self-pay

## 2023-08-24 DIAGNOSIS — E785 Hyperlipidemia, unspecified: Secondary | ICD-10-CM

## 2023-09-13 ENCOUNTER — Other Ambulatory Visit (HOSPITAL_COMMUNITY)
Admission: RE | Admit: 2023-09-13 | Discharge: 2023-09-13 | Disposition: A | Discharge status: Home / Self Care | Payer: Medicare PPO | Source: Ambulatory Visit | Attending: Nurse Practitioner | Admitting: Nurse Practitioner

## 2023-09-13 DIAGNOSIS — E785 Hyperlipidemia, unspecified: Secondary | ICD-10-CM | POA: Insufficient documentation

## 2023-09-13 LAB — LIPID PANEL
Cholesterol: 103 mg/dL (ref 0–200)
HDL: 36 mg/dL — ABNORMAL LOW (ref >40)
LDL Cholesterol: 56 mg/dL (ref 0–99)
Total CHOL/HDL Ratio: 2.9 {ratio}
Triglycerides: 54 mg/dL (ref <150)
VLDL: 11 mg/dL (ref 0–40)

## 2023-09-15 ENCOUNTER — Ambulatory Visit: Payer: Medicare PPO | Attending: Nurse Practitioner | Admitting: Nurse Practitioner

## 2023-09-15 ENCOUNTER — Encounter: Payer: Self-pay | Admitting: Nurse Practitioner

## 2023-09-15 VITALS — BP 118/66 | HR 54 | Ht 75.0 in | Wt 177.4 lb

## 2023-09-15 DIAGNOSIS — I480 Paroxysmal atrial fibrillation: Secondary | ICD-10-CM

## 2023-09-15 DIAGNOSIS — I2510 Atherosclerotic heart disease of native coronary artery without angina pectoris: Secondary | ICD-10-CM

## 2023-09-15 DIAGNOSIS — E785 Hyperlipidemia, unspecified: Secondary | ICD-10-CM | POA: Diagnosis not present

## 2023-09-15 DIAGNOSIS — I5032 Chronic diastolic (congestive) heart failure: Secondary | ICD-10-CM | POA: Diagnosis not present

## 2023-09-15 DIAGNOSIS — Z720 Tobacco use: Secondary | ICD-10-CM

## 2023-09-15 DIAGNOSIS — I739 Peripheral vascular disease, unspecified: Secondary | ICD-10-CM

## 2023-09-15 NOTE — Progress Notes (Signed)
 Office Visit    Patient Name: Brian Mcclure Date of Encounter: 09/15/2023  Primary Care Provider:  [facility] Primary Cardiologist:  Brian Kendall, MD  Chief Complaint    68 y.o. male with a history of CAD status post anterior MI and LAD stenting in August 2020,  ischemic cardiomyopathy, HFimpEF, LV apical thrombus, paroxysmal atrial fibrillation, peripheral vascular disease, sinus and junctional bradycardia in the setting of anterior MI, hyperlipidemia, COPD, and tobacco abuse, who presents for f/u today r/t CAD.  Past Medical History  Subjective   Past Medical History:  Diagnosis Date   Actinic keratosis    Acute ST elevation myocardial infarction (STEMI) of anterior wall (HCC)    a. 02/2019 s/p PCI/DES ->LAD.   Apical mural thrombus following MI (HCC)    a. 02/2019 s/p Ant MI-->Echo: Small, fixed thrombus on the apical wall of LV-->coumadin.   Barrett's esophagus    CAD (coronary artery disease)    a. 02/2019 Ant STEMI/PCI: LM nl, LAD 100p (4x18 Resolute Onyx DES), LCX/RCA min irregs, EF 35-45%; b. 10/2022 MV: EF 38% (nl by echo). No isch/infarct.   Chronic back pain    Colon polyps    a. 07/2017 Colonoscopy: 5mm sigmoid polyp, tubular adenoma, diverticulosis, int hemorrhoids.   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 02/2019 Echo: EF 40-45%; b. 06/2019 Echo: EF 50-55%; c. 10/2022 Echo: EF 55-60%, no rwma, nl RV fxn, mild MR.   Hydrocele    Hyperlipidemia    Ischemic cardiomyopathy    a. 02/2019 Echo: EF 40-45%; b. 06/2019 Echo: EF 50-55%; c. 10/2022 Echo: EF 55-60%.   Myalgia    Osteoarthritis    C-spine   PAF (paroxysmal atrial fibrillation) (HCC)    a. CHA2DS2VASc = 3-->eliquis   Peripheral vascular disease (HCC)    a. 2018 s/p aorto-iliac stent graft; b. 11/2022  ABI/Duplex: R 1.24, L 1.02. Bilateral inflow dzs noted on duplex.   Reactive airway disease    Seborrheic keratosis    Sinus bradycardia    a. 02/2019 Jxnl rhythm and sinus brady in setting of Ant MI-->CCB d/c'd.   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   Tobacco use disorder    Tremor    Past Surgical History:  Procedure Laterality Date   [facility]   [facility]     COLONOSCOPY WITH PROPOFOL N/A 08/05/2017   Procedure: COLONOSCOPY WITH PROPOFOL;  Surgeon: Pasty Spillers, MD;  Location: [facility] ENDOSCOPY;  Service: Endoscopy;  Laterality: N/A;   CORONARY/GRAFT ACUTE MI REVASCULARIZATION N/A 03/12/2019   Procedure: Coronary/Graft Acute MI Revascularization;  Surgeon: Swaziland, Peter M, MD;  Location: [facility] INVASIVE CV LAB;  Service: Cardiovascular;  Laterality: N/A;   ESOPHAGOGASTRODUODENOSCOPY (EGD) WITH PROPOFOL N/A 08/05/2017   Procedure: ESOPHAGOGASTRODUODENOSCOPY (EGD) WITH PROPOFOL;  Surgeon: Pasty Spillers, MD;  Location: [facility] ENDOSCOPY;  Service: Endoscopy;  Laterality: N/A;   LEFT HEART CATH AND CORONARY ANGIOGRAPHY N/A 03/12/2019   Procedure: LEFT HEART CATH AND CORONARY ANGIOGRAPHY;  Surgeon: Swaziland, Peter M, MD;  Location: [facility] INVASIVE CV LAB;  Service: Cardiovascular;  Laterality: N/A;   NASAL SINUS SURGERY     several   SPLENECTOMY  01/2017    Allergies  Allergies  Allergen Reactions   Statins  Other (See Comments)    Leg Cramps   Amoxicillin-Pot Clavulanate Diarrhea   Atorvastatin Other (See Comments)   Atorvastatin Calcium Other (See Comments)   Clavulanic Acid Diarrhea   Codeine Other (See Comments)   Menthol     Other Reaction(s): Congestion Nose   Nicotine Polacrilex    Pravastatin Other (See Comments)   Bupropion Other (See Comments)    Caused somnolence (and didn't help to stop smoking)   Flunisolide Other (See Comments)    didn't work  Other Reaction(s): Nasal congestion   Nicotine Other (See Comments)     didn't help stop smoking      History of Present Illness      68 y.o. y/o male with above complex past medical history including CAD, ischemic cardiomyopathy, HFimpEF, LV apical thrombus, paroxysmal atrial fibrillation, peripheral vascular disease, sinus and junctional bradycardia, hyperlipidemia, COPD, and tobacco abuse.  In August 2020, he presented with anterior STEMI.  Diagnostic catheterization revealed a total occlusion of the proximal LAD and otherwise nonobstructive disease.  EF was 35 to 45% by ventriculography.  The LAD was successfully treated with a drug-eluting stent.  Follow-up echo showed persistent LV dysfunction with an EF of 40 to 45% and a small, fixed thrombus in the apical wall of left ventricle, and he was placed on Coumadin.  During hospitalization, he had junctional rhythm and sinus bradycardia resulting in discontinuation of home dose of diltiazem.  He subsequently developed atrial flutter in the outpatient setting and was placed on low-dose beta-blocker and was maintained on warfarin, which was later changed to Eliquis.    [name] subsequently transitioned his cardiology care to the [facility], prior to returning to our practice in April 2024.  At that time, he reported more exertional dyspnea and fatigue as well as episodic chest pain, and bilateral calf claudication.  Stress testing was undertaken and showed no evidence of ischemia but depressed EF at 38%.  This was followed by echocardiogram, which showed an EF of 55-60% w/o regional wall motion abnormalities.  Lower extremity ABIs were normal although duplex suggested some degree of in-stent restenosis.  He saw Dr. Kirke Mcclure 02/18/2023 w/ recommendation for ongoing medical therapy and a walking program.      Since his last visit in August 2024, he states he has had no change in his condition. He completed the pulmonary rehab back in August. He still is smoking 1/pack a day. He has not experienced chest pain. He did acknowledge  to have occasional palpitations (once a month) that come and go quickly, though denies PND, orthopnea, dizziness, syncope, edema, or early satiety.  Re: smoking, he wants to quit.  His wife also smokes.  He has tried the patch and gum in the past, but doesn't feel that it helped him any.  He was involved in a trial r/t wellbutrin in the 90's, and felt that-that made him fatigued.  Objective  Home Medications    Current Outpatient Medications  Medication Sig Dispense Refill   apixaban (ELIQUIS) 5 MG TABS tablet TAKE ONE TABLET BY MOUTH EVERY 12 HOURS CAUTION BLOOD THINNER     Ca Carbonate-Mag Hydroxide (ROLAIDS PO) Take 1 tablet by mouth at bedtime as needed (heartburn/acid reflux).     cholecalciferol (VITAMIN D) 25 MCG (1000 UT) tablet Take 1,000 Units by mouth 2 (two) times daily.     diclofenac sodium (VOLTAREN) 1 % GEL Apply 1 application  topically daily as needed (pain).     famotidine (PEPCID) 20 MG  tablet Take 20 mg by mouth daily.     hydrocortisone cream 1 % SMARTSIG:1 Topical Daily     levofloxacin (LEVAQUIN) 750 MG tablet TAKE ONE TABLET BY MOUTH ONCE FOR BACTEREMIA . DO NOT TAKE WITH DAIRY PRODUCTS;ANTACID;SUCRALFATE;IRON;ZINC. TAKE FOR FEVER MORE THAN 100.5 WHILE ON YOUR WAY TO CLOSEST EMERGENCY DEPARTMENT.     loratadine (CLARITIN) 10 MG tablet TAKE ONE TABLET BY MOUTH EVERY DAY FOR ALLERGIES     losartan (COZAAR) 25 MG tablet Take 0.5 tablets (12.5 mg total) by mouth daily. 15 tablet 1   metoprolol succinate (TOPROL XL) 25 MG 24 hr tablet Take 0.5 tablets (12.5 mg total) by mouth daily. 45 tablet 3   nitroGLYCERIN (NITROSTAT) 0.4 MG SL tablet Place 1 tablet (0.4 mg total) under the tongue every 5 (five) minutes x 3 doses as needed for chest pain. 25 tablet 12   oxymetazoline (AFRIN) 0.05 % nasal spray Place 1 spray into both nostrils 2 (two) times daily as needed for congestion.     Simethicone (GAS-X PO) Take 1 tablet by mouth 3 (three) times daily as needed (heartburn).      albuterol (VENTOLIN HFA) 108 (90 Base) MCG/ACT inhaler      rosuvastatin (CRESTOR) 20 MG tablet Take 20 mg by mouth at bedtime.     No current facility-administered medications for this visit.     Physical Exam    VS:  BP 118/66   Pulse (!) 54   Ht 6\' 3"  (1.905 m)   Wt 177 lb 6.4 oz (80.5 kg)   SpO2 98%   BMI 22.17 kg/m  , BMI Body mass index is 22.17 kg/m.       GEN: Well nourished, well developed, in no acute distress. HEENT: normal. Neck: Supple, no JVD, carotid bruits, or masses. Cardiac: RRR, no murmurs, rubs, or gallops. No clubbing, cyanosis, edema.  Radials 2+/PT 2+ and equal bilaterally.  Respiratory:  Respirations regular and unlabored, clear to auscultation bilaterally. GI: Soft, nontender, nondistended, BS + x 4. MS: no deformity or atrophy. Skin: warm and dry, no rash. Neuro:  Strength and sensation are intact. Psych: Normal affect.  Accessory Clinical Findings    ECG personally reviewed by me today - EKG Interpretation Date/Time:  Wednesday September 15 2023 10:50:30 EST Ventricular Rate:  54 PR Interval:  128 QRS Duration:  82 QT Interval:  422 QTC Calculation: 400 R Axis:   40  Text Interpretation: Sinus bradycardia Confirmed by Nicolasa Ducking [phone]) on 09/15/2023 11:19:06 AM   Lab Results  Component Value Date   WBC 11.5 (H) 11/05/2021   HGB 13.7 11/05/2021   HCT 42.4 11/05/2021   MCV 94.2 11/05/2021   PLT 215 11/05/2021   Lab Results  Component Value Date   CREATININE 1.13 11/05/2021   BUN 12 11/05/2021   NA 140 11/05/2021   K 3.6 11/05/2021   CL 106 11/05/2021   CO2 28 11/05/2021   Lab Results  Component Value Date   ALT 15 03/08/2023   AST 17 03/08/2023   ALKPHOS 55 03/08/2023   BILITOT 0.4 03/08/2023   Lab Results  Component Value Date   CHOL 103 09/13/2023   HDL 36 (L) 09/13/2023   LDLCALC 56 09/13/2023   TRIG 54 09/13/2023   CHOLHDL 2.9 09/13/2023    Lab Results  Component Value Date   HGBA1C 6.0 (H) 03/12/2019    No results found for: "TSH"     Assessment & Plan    1.  Coronary artery  disease: History of anterior STEMI with LAD stenting in August 2020.  Last seen in August 2024 with complaints of exercise intolerance and occasional chest discomfort.  Stress testing was undertaken and showed an EF of 30% without ischemia or infarct.  Subsequent echo showed normal LV function without regional wall motion abnormalities. He completed pulmonary rehab back in August. During this time he could exercise without chest pain. Since the completion of rehab, he continues to have chronic, stable dyspnea on exertion, and also cont to smoke. He otherwise remains on his statin, beta-blocker and ARB therapy.  No aspirin in the setting of chronic Eliquis.   2.  Chronic heart failure with improved ejection fraction: Echo in April 2024 showed an EF of 55 to 60% and normal RV function, with mild MR.  He is euvolemic on exam with normal blood pressure. He remains on beta-blocker and ARB.   3.  Peripheral vascular disease: Recently seen by Dr. Kirke Mcclure in August 2024 in the setting of some degree of in-stent restenosis on the left.  Denies claudication. Follow up with Arida in 6 months.    4.  Paroxysmal atrial fibrillation/sinus bradycardia: EKG today shows sinus bradycardia at 54 bpm, which he says has been common for him over many many years.  Asymptomatic.  Continue low-dose beta-blocker and Eliquis.   5.  Hyperlipidemia:  Lipid panel was taken this week. LDL 56. Continue Rosuvastatin.   6.  Tobacco abuse: He is smoking 1 pack a day. He expresses wanting to quit. Gave the option to start Chantix, but declined at this time.  Complete cessation advised.   7.  Disposition: Follow-up in 6 months with Dr. Kirke Mcclure for PV f/u and Dr. Okey Dupre in 9 mos.  Nicolasa Ducking, NP 09/15/2023, 1:04 PM

## 2023-09-15 NOTE — Patient Instructions (Signed)
 Medication Instructions:  No changes  *If you need a refill on your cardiac medications before your next appointment, please call your pharmacy*   Lab Work: None ordered If you have labs (blood work) drawn today and your tests are completely normal, you will receive your results only by: MyChart Message (if you have MyChart) OR A paper copy in the mail If you have any lab test that is abnormal or we need to change your treatment, we will call you to review the results.   Testing/Procedures: None ordered   Follow-Up: At [facility], you and your health needs are our priority.  As part of our continuing mission to provide you with exceptional heart care, we have created designated Provider Care Teams.  These Care Teams include your primary Cardiologist (physician) and Advanced Practice Providers (APPs -  Physician Assistants and Nurse Practitioners) who all work together to provide you with the care you need, when you need it.  We recommend signing up for the patient portal called "MyChart".  Sign up information is provided on this After Visit Summary.  MyChart is used to connect with patients for Virtual Visits (Telemedicine).  Patients are able to view lab/test results, encounter notes, upcoming appointments, etc.  Non-urgent messages can be sent to your provider as well.   To learn more about what you can do with MyChart, go to [email or web address].    Your next appointment:   Follow up in August with Dr. [name] (PV) Follow up in 9 months with Dr. Okey Dupre

## 2023-10-04 MED ORDER — ROSUVASTATIN CALCIUM 20 MG PO TABS: 20.0000 mg | ORAL_TABLET | Freq: Every day | ORAL | 0 refills | Status: DC

## 2023-11-12 ENCOUNTER — Ambulatory Visit
Admission: RE | Admit: 2023-11-12 | Discharge: 2023-11-12 | Disposition: A | Discharge status: Home / Self Care | Source: Ambulatory Visit | Attending: Emergency Medicine | Admitting: Emergency Medicine

## 2023-11-12 VITALS — BP 130/72 | HR 60 | Temp 98.4°F | Resp 18

## 2023-11-12 DIAGNOSIS — W57XXXA Bitten or stung by nonvenomous insect and other nonvenomous arthropods, initial encounter: Secondary | ICD-10-CM | POA: Diagnosis not present

## 2023-11-12 DIAGNOSIS — F17210 Nicotine dependence, cigarettes, uncomplicated: Secondary | ICD-10-CM | POA: Insufficient documentation

## 2023-11-12 DIAGNOSIS — S30860A Insect bite (nonvenomous) of lower back and pelvis, initial encounter: Secondary | ICD-10-CM

## 2023-11-12 DIAGNOSIS — S40861A Insect bite (nonvenomous) of right upper arm, initial encounter: Secondary | ICD-10-CM | POA: Diagnosis not present

## 2023-11-12 DIAGNOSIS — L089 Local infection of the skin and subcutaneous tissue, unspecified: Secondary | ICD-10-CM | POA: Diagnosis not present

## 2023-11-12 DIAGNOSIS — S21232A Puncture wound without foreign body of left back wall of thorax without penetration into thoracic cavity, initial encounter: Secondary | ICD-10-CM

## 2023-11-12 DIAGNOSIS — S41131A Puncture wound without foreign body of right upper arm, initial encounter: Secondary | ICD-10-CM | POA: Diagnosis not present

## 2023-11-12 MED ORDER — SULFAMETHOXAZOLE-TRIMETHOPRIM 800-160 MG PO TABS: 1.0000 | ORAL_TABLET | Freq: Two times a day (BID) | ORAL | 0 refills | Status: AC

## 2023-11-12 NOTE — ED Provider Notes (Signed)
 Brian Mcclure    CSN: [id number] Arrival date & time: 11/12/23  1118      History   Chief Complaint Chief Complaint  Patient presents with   Insect Bite    Unusual reaction to tick bite - Entered by patient    HPI Brian Mcclure is a 68 y.o. male.   Patient presents for evaluation of an insect bite present to the right arm and to the left side of the back getting 5 to 6 days ago.  Site has been painful and tender described as a burning sensation.  Sites have been erythematous.  He has noticed pale yellow drainage.  Has been taking doxycycline with no improvement as area was evaluated by PCP.  Denies fever.  Endorses tick removal from both locations prior to symptoms beginning.  Past Medical History:  Diagnosis Date   Actinic keratosis    Acute ST elevation myocardial infarction (STEMI) of anterior wall (HCC)    a. 02/2019 s/p PCI/DES ->LAD.   Apical mural thrombus following MI (HCC)    a. 02/2019 s/p Ant MI-->Echo: Small, fixed thrombus on the apical wall of LV-->coumadin .   Barrett's esophagus    CAD (coronary artery disease)    a. 02/2019 Ant STEMI/PCI: LM nl, LAD 100p (4x18 Resolute Onyx DES), LCX/RCA min irregs, EF 35-45%; b. 10/2022 MV: EF 38% (nl by echo). No isch/infarct.   Chronic back pain    Colon polyps    a. 07/2017 Colonoscopy: 5mm sigmoid polyp, tubular adenoma, diverticulosis, int hemorrhoids.   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 02/2019 Echo: EF 40-45%; b. 06/2019 Echo: EF 50-55%; c. 10/2022 Echo: EF 55-60%, no rwma, nl RV fxn, mild MR.   Hydrocele    Hyperlipidemia    Ischemic cardiomyopathy    a. 02/2019 Echo: EF 40-45%; b. 06/2019 Echo: EF 50-55%; c. 10/2022 Echo: EF 55-60%.   Myalgia    Osteoarthritis    C-spine   PAF (paroxysmal atrial fibrillation) (HCC)     a. CHA2DS2VASc = 3-->eliquis   Peripheral vascular disease (HCC)    a. 2018 s/p aorto-iliac stent graft; b. 11/2022 ABI/Duplex: R 1.24, L 1.02. Bilateral inflow dzs noted on duplex.   Reactive airway disease    Seborrheic keratosis    Sinus bradycardia    a. 02/2019 Jxnl rhythm and sinus brady in setting of Ant MI-->CCB d/c'd.   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   Tobacco use disorder    Tremor     Patient Active Problem List   Diagnosis Date Noted   Claudication in peripheral vascular disease (HCC) 10/28/2022   Chronic HFrEF (heart failure with reduced ejection fraction) (HCC) 10/28/2022   Hyperlipidemia LDL goal <70 10/28/2022   Coronary artery disease involving native coronary artery of native heart with angina pectoris (HCC) 03/13/2019   Presence of drug coated stent in LAD coronary artery 03/13/2019   Tobacco abuse 03/13/2019   Left ventricular thrombus following MI (HCC) 03/13/2019   Sinus bradycardia, persistent 03/13/2019   Paroxysmal atrial fibrillation (HCC)    Acute ST elevation myocardial infarction (STEMI) of anterior wall (HCC) 03/12/2019   SBO (small bowel obstruction) (HCC) 05/29/2018   Columnar epithelial-lined lower esophagus    Special screening for malignant neoplasms, colon    Polyp of  sigmoid colon    Diverticulosis of large intestine without diverticulitis    Hypocalcemia 02/06/2017    Past Surgical History:  Procedure Laterality Date   [facility]   [facility]     COLONOSCOPY WITH PROPOFOL  N/A 08/05/2017   Procedure: COLONOSCOPY WITH PROPOFOL ;  Surgeon: Irby Mannan, MD;  Location: [facility] ENDOSCOPY;  Service: Endoscopy;  Laterality: N/A;   CORONARY/GRAFT ACUTE MI REVASCULARIZATION N/A 03/12/2019   Procedure: Coronary/Graft Acute MI Revascularization;  Surgeon: Swaziland, Peter M, MD;  Location: [facility] INVASIVE CV LAB;  Service: Cardiovascular;  Laterality: N/A;   ESOPHAGOGASTRODUODENOSCOPY (EGD) WITH  PROPOFOL  N/A 08/05/2017   Procedure: ESOPHAGOGASTRODUODENOSCOPY (EGD) WITH PROPOFOL ;  Surgeon: Irby Mannan, MD;  Location: [facility] ENDOSCOPY;  Service: Endoscopy;  Laterality: N/A;   LEFT HEART CATH AND CORONARY ANGIOGRAPHY N/A 03/12/2019   Procedure: LEFT HEART CATH AND CORONARY ANGIOGRAPHY;  Surgeon: Swaziland, Peter M, MD;  Location: [facility] INVASIVE CV LAB;  Service: Cardiovascular;  Laterality: N/A;   NASAL SINUS SURGERY     several   SPLENECTOMY  01/2017       Home Medications    Prior to Admission medications   Medication Sig Start Date End Date Taking? Authorizing Provider  sulfamethoxazole-trimethoprim (BACTRIM DS) 800-160 MG tablet Take 1 tablet by mouth 2 (two) times daily for 7 days. 11/12/23 11/19/23 Yes Amoni Morales, Maybelle Spatz, NP  albuterol (VENTOLIN HFA) 108 (90 Base) MCG/ACT inhaler  04/28/22   [provider]  apixaban (ELIQUIS) 5 MG TABS tablet TAKE ONE TABLET BY MOUTH EVERY 12 HOURS CAUTION BLOOD THINNER 07/30/22   [provider]  Ca Carbonate-Mag Hydroxide (ROLAIDS PO) Take 1 tablet by mouth at bedtime as needed (heartburn/acid reflux).    [provider]  cholecalciferol (VITAMIN D) 25 MCG (1000 UT) tablet Take 1,000 Units by mouth 2 (two) times daily. 03/08/19   [provider]  diclofenac sodium (VOLTAREN) 1 % GEL Apply 1 application  topically daily as needed (pain).    [provider]  famotidine (PEPCID) 20 MG tablet Take 20 mg by mouth daily. 04/06/19   [provider]  hydrocortisone cream 1 % SMARTSIG:1 Topical Daily 07/30/22   [provider]  loratadine (CLARITIN) 10 MG tablet TAKE ONE TABLET BY MOUTH EVERY DAY FOR ALLERGIES 11/21/21   [provider]  losartan  (COZAAR ) 25 MG tablet Take 0.5 tablets (12.5 mg total) by mouth daily. 06/07/19   Florette Hurry, NP  metoprolol  succinate (TOPROL  XL) 25 MG 24 hr tablet Take 0.5 tablets (12.5 mg total) by mouth daily. 05/03/19   Florette Hurry, NP  nitroGLYCERIN  (NITROSTAT ) 0.4 MG SL tablet Place 1 tablet (0.4 mg total) under the tongue every 5 (five) minutes x 3 doses as needed for chest pain. 03/14/19   Bhagat, Bhavinkumar, PA  oxymetazoline (AFRIN) 0.05 % nasal spray Place 1 spray into both nostrils 2 (two) times daily as needed for congestion.    [provider]  rosuvastatin  (CRESTOR ) 20 MG tablet Take 1 tablet (20 mg total) by mouth at bedtime. 10/04/23 10/04/24  Florette Hurry, NP  Simethicone  (GAS-X PO) Take 1 tablet by mouth 3 (three) times daily as needed (heartburn).    [provider]    Family History Family History  Problem Relation Age of Onset   Arrhythmia Mother    Diabetes Brother     Social History Social History   Tobacco Use   Smoking status: Every Day    Current packs/day:  1.00    Average packs/day: 1 pack/day for 40.0 years (40.0 ttl pk-yrs)    Types: Cigarettes   Smokeless tobacco: Never  Vaping Use   Vaping status: Never Used  Substance Use Topics   Alcohol use: Yes    Comment: rare - 1-2x/yr   Drug use: No     Allergies   Statins, Amoxicillin-pot clavulanate, Atorvastatin, Atorvastatin calcium , Clavulanic acid, Codeine, Menthol, Nicotine polacrilex, Pravastatin, Bupropion, Flunisolide, and Nicotine   Review of Systems Review of Systems   Physical Exam Triage Vital Signs ED Triage Vitals [11/12/23 1130]  Encounter Vitals Group     BP 130/72     Systolic BP Percentile      Diastolic BP Percentile      Pulse Rate 60     Resp 18     Temp 98.4 F (36.9 C)     Temp Source Oral     SpO2 97 %     Weight      Height      Head Circumference      Peak Flow      Pain Score 3     Pain Loc      Pain Education      Exclude from Growth Chart    No data found.  Updated Vital Signs BP 130/72 (BP Location: Left Arm)   Pulse 60   Temp 98.4 F (36.9 C) (Oral)   Resp 18   SpO2 97%   Visual Acuity Right Eye Distance:   Left Eye Distance:    Bilateral Distance:    Right Eye Near:   Left Eye Near:    Bilateral Near:     Physical Exam Constitutional:      Appearance: Normal appearance.  Eyes:     Extraocular Movements: Extraocular movements intact.  Pulmonary:     Effort: Pulmonary effort is normal.  Musculoskeletal:     Comments: 0.5 cm wound present to the left thoracic region of back with surrounding erythema and mild swelling, skin warm to touch and yellow purulent drainage expelled with minimal palpation  0.5 cm wound present to the posterior of the right upper arm with surrounding erythema and mild swelling, skin warm to touch, no drainage at this time  Neurological:     Mental Status: He is alert and oriented to person, place, and time. Mental status is at baseline.      UC Treatments / Results  Labs (all labs ordered are listed, but only abnormal results are displayed) Labs Reviewed  AEROBIC CULTURE W GRAM STAIN (SUPERFICIAL SPECIMEN)    EKG   Radiology No results found.  Procedures Procedures (including critical care time)  Medications Ordered in UC Medications - No data to display  Initial Impression / Assessment and Plan / UC Course  I have reviewed the triage vital signs and the nursing notes.  Pertinent labs & imaging results that were available during my care of the patient were reviewed by me and considered in my medical decision making (see chart for details).  Puncture wound of left side of back, tick bite of back, puncture wound of right upper arm, tick bite of right upper arm  Presentation consistent with infection, not consistent with Lyme disease or allergic reaction as pus is present, wound culture being obtained as he is thought no improvement with use of doxycycline, discontinued and initiated Bactrim, recommended daily cleansing, may cover as needed, advised follow-up if wounds do not heal Final Clinical Impressions(s) / UC Diagnoses  Final diagnoses:  Puncture wound of left  side of back, initial encounter  Tick bite of back, initial encounter  Puncture wound of right upper arm, initial encounter     Discharge Instructions      Evaluated for your wounds which do appear to be infected as I can see Shantese Raven pus from location on the back  Wound sample has been obtained, should result in 2 to 3 days, you will be notified of antibiotic needs to be changed again  Stop use of doxycycline begin Bactrim twice daily for 7 days  Cleanse over wounds daily with soap and water, pat and do not rub, may leave open to air  If symptoms continue to persist or worsen at any point please follow-up with your primary doctor for reevaluation   ED Prescriptions     Medication Sig Dispense Auth. Provider   sulfamethoxazole-trimethoprim (BACTRIM DS) 800-160 MG tablet Take 1 tablet by mouth 2 (two) times daily for 7 days. 14 tablet Troye Hiemstra R, NP      PDMP not reviewed this encounter.   Reena Canning, NP 11/12/23 1226

## 2023-11-12 NOTE — ED Triage Notes (Signed)
 Pt present insect bite on his back and right arm. Pt states his wife removed the tick from a few days ago and now the area is red with itching and burning.

## 2023-11-12 NOTE — Discharge Instructions (Signed)
 Evaluated for your wounds which do appear to be infected as I can see Camry Theiss pus from location on the back  Wound sample has been obtained, should result in 2 to 3 days, you will be notified of antibiotic needs to be changed again  Stop use of doxycycline begin Bactrim twice daily for 7 days  Cleanse over wounds daily with soap and water, pat and do not rub, may leave open to air  If symptoms continue to persist or worsen at any point please follow-up with your primary doctor for reevaluation

## 2023-11-15 LAB — AEROBIC CULTURE W GRAM STAIN (SUPERFICIAL SPECIMEN)

## 2023-12-31 ENCOUNTER — Other Ambulatory Visit: Payer: Self-pay | Admitting: Nurse Practitioner

## 2024-08-02 ENCOUNTER — Encounter: Payer: Self-pay | Admitting: Nurse Practitioner

## 2024-08-02 ENCOUNTER — Ambulatory Visit: Attending: Nurse Practitioner | Admitting: Nurse Practitioner

## 2024-08-02 VITALS — BP 120/60 | HR 61 | Ht 75.0 in | Wt 190.0 lb

## 2024-08-02 DIAGNOSIS — I739 Peripheral vascular disease, unspecified: Secondary | ICD-10-CM

## 2024-08-02 DIAGNOSIS — I25110 Atherosclerotic heart disease of native coronary artery with unstable angina pectoris: Secondary | ICD-10-CM

## 2024-08-02 DIAGNOSIS — R001 Bradycardia, unspecified: Secondary | ICD-10-CM

## 2024-08-02 DIAGNOSIS — E785 Hyperlipidemia, unspecified: Secondary | ICD-10-CM | POA: Diagnosis not present

## 2024-08-02 DIAGNOSIS — I480 Paroxysmal atrial fibrillation: Secondary | ICD-10-CM | POA: Diagnosis not present

## 2024-08-02 DIAGNOSIS — Z720 Tobacco use: Secondary | ICD-10-CM | POA: Diagnosis not present

## 2024-08-02 DIAGNOSIS — I200 Unstable angina: Secondary | ICD-10-CM

## 2024-08-02 DIAGNOSIS — I5020 Unspecified systolic (congestive) heart failure: Secondary | ICD-10-CM

## 2024-08-02 NOTE — Progress Notes (Signed)
 "    Office Visit    Patient Name: Brian Mcclure Date of Encounter: 08/02/2024  Primary Care Provider:  [facility] Primary Cardiologist:  Lonni Hanson, MD    Chief Complaint    69 y.o. male with a history of CAD status post anterior MI and LAD stenting in August 2020,  ischemic cardiomyopathy, HFimpEF, LV apical thrombus, paroxysmal atrial fibrillation, peripheral vascular disease, sinus and junctional bradycardia in the setting of anterior MI, hyperlipidemia, COPD, and tobacco abuse, who presents for CAD follow-up.  Past Medical History   Subjective   Past Medical History:  Diagnosis Date   Actinic keratosis    Acute ST elevation myocardial infarction (STEMI) of anterior wall (HCC)    a. 02/2019 s/p PCI/DES ->LAD.   Apical mural thrombus following MI (HCC)    a. 02/2019 s/p Ant MI-->Echo: Small, fixed thrombus on the apical wall of LV-->coumadin .   Barrett's esophagus    CAD (coronary artery disease)    a. 02/2019 Ant STEMI/PCI: LM nl, LAD 100p (4x18 Resolute Onyx DES), LCX/RCA min irregs, EF 35-45%; b. 10/2022 MV: EF 38% (nl by echo). No isch/infarct.   Chronic back pain    Colon polyps    a. 07/2017 Colonoscopy: 5mm sigmoid polyp, tubular adenoma, diverticulosis, int hemorrhoids.   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 02/2019 Echo: EF 40-45%; b. 06/2019 Echo: EF 50-55%; c. 10/2022 Echo: EF 55-60%, no rwma, nl RV fxn, mild MR.   Hydrocele    Hyperlipidemia    Ischemic cardiomyopathy    a. 02/2019 Echo: EF 40-45%; b. 06/2019 Echo: EF 50-55%; c. 10/2022 Echo: EF 55-60%.   Myalgia    Osteoarthritis    C-spine   PAF (paroxysmal atrial fibrillation) (HCC)    a. CHA2DS2VASc = 3-->eliquis   Peripheral vascular disease    a. 2018 s/p aorto-iliac stent graft; b. 11/2022  ABI/Duplex: R 1.24, L 1.02. Bilateral inflow dzs noted on duplex.   Reactive airway disease    Seborrheic keratosis    Sinus bradycardia    a. 02/2019 Jxnl rhythm and sinus brady in setting of Ant MI-->[facility] d/c'd.   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   Tobacco use disorder    Tremor    Past Surgical History:  Procedure Laterality Date   [facility]   [facility]COPY     COLONOSCOPY WITH PROPOFOL  N/A 08/05/2017   Procedure: COLONOSCOPY WITH PROPOFOL ;  Surgeon: Janalyn Keene NOVAK, MD;  Location: [facility] ENDOSCOPY;  Service: Endoscopy;  Laterality: N/A;   CORONARY/GRAFT ACUTE MI REVASCULARIZATION N/A 03/12/2019   Procedure: Coronary/Graft Acute MI Revascularization;  Surgeon: Jordan, Peter M, MD;  Location: [facility] INVASIVE CV LAB;  Service: Cardiovascular;  Laterality: N/A;   ESOPHAGOGASTRODUODENOSCOPY (EGD) WITH PROPOFOL  N/A 08/05/2017   Procedure: ESOPHAGOGASTRODUODENOSCOPY (EGD) WITH PROPOFOL ;  Surgeon: Janalyn Keene NOVAK, MD;  Location: [facility] ENDOSCOPY;  Service: Endoscopy;  Laterality: N/A;   LEFT HEART CATH AND CORONARY ANGIOGRAPHY N/A 03/12/2019   Procedure: LEFT HEART CATH AND CORONARY ANGIOGRAPHY;  Surgeon: Jordan, Peter M, MD;  Location: [facility] INVASIVE CV LAB;  Service: Cardiovascular;  Laterality: N/A;   NASAL SINUS SURGERY     several   SPLENECTOMY  01/2017    Allergies  Allergies[1]  History of Present Illness      69 y.o. y/o male with the above complex past medical history including CAD, ischemic cardiomyopathy, HFimpEF, LV apical thrombus, paroxysmal atrial fibrillation, peripheral vascular disease, sinus and junctional bradycardia, hyperlipidemia, COPD, and tobacco abuse.  In August 2020, he presented with anterior STEMI.  Diagnostic catheterization revealed a total occlusion of the proximal LAD and otherwise nonobstructive disease.  EF was 35 to 45% by ventriculography.  The LAD was successfully treated with a drug-eluting stent.   Follow-up echo showed persistent LV dysfunction with an EF of 40 to 45% and a small, fixed thrombus in the apical wall of left ventricle, and he was placed on Coumadin .  During hospitalization, he had junctional rhythm and sinus bradycardia resulting in discontinuation of home dose of diltiazem.  He subsequently developed atrial flutter in the outpatient setting and was placed on low-dose beta-blocker and was maintained on warfarin, which was later changed to Eliquis.    Brian Mcclure subsequently transitioned his cardiology care to the [facility], prior to returning to our practice in April 2024.  At that time, he reported more exertional dyspnea and fatigue as well as episodic chest pain, and bilateral calf claudication.  Stress testing was undertaken and showed no evidence of ischemia but depressed EF at 38%.  This was followed by echocardiogram, which showed an EF of 55-60% w/o regional wall motion abnormalities.  Lower extremity ABIs were normal although duplex suggested some degree of in-stent restenosis.  He saw Dr. Darron 02/18/2023 w/ recommendation for ongoing medical therapy and a walking program.      Brian Mcclure was last seen in cardiology clinic in February 2025 at which time he continued to smoke but was otherwise doing well.  Over the past 6 months or so, he has been experiencing intermittent exertional chest discomfort up to 6 or 8 times per month, without associated symptoms, lasting a few minutes, and resolving with rest.  He has not had any symptoms at rest.  He has chronic, stable claudication which he sometimes notes when walking fast or up inclines.  He thinks he may have had 1 episode of atrial fibrillation at some point in the past 12 months, lasting a few hours, resolving spontaneously.  He notes good tolerance of medications.  He denies PND, orthopnea, dizziness, syncope, edema, or early satiety. Objective   Home Medications    Current Outpatient Medications  Medication Sig Dispense  Refill   apixaban (ELIQUIS) 5 MG TABS tablet TAKE ONE TABLET BY MOUTH EVERY 12 HOURS CAUTION BLOOD THINNER     Ca Carbonate-Mag Hydroxide (ROLAIDS PO) Take 1 tablet by mouth at bedtime as needed (heartburn/acid reflux).     cholecalciferol (VITAMIN D) 25 MCG (1000 UT) tablet Take 1,000 Units by mouth 2 (two) times daily.     diclofenac sodium (VOLTAREN) 1 % GEL Apply 1 application  topically daily as needed (pain).     famotidine (PEPCID) 20 MG tablet Take 20 mg by mouth daily.     hydrocortisone cream 1 % SMARTSIG:1 Topical Daily     loratadine (CLARITIN) 10 MG tablet TAKE ONE TABLET BY MOUTH EVERY DAY FOR ALLERGIES     losartan  (COZAAR ) 25 MG tablet Take 0.5 tablets (12.5 mg total) by mouth daily. 15 tablet 1   metoprolol  succinate (TOPROL  XL) 25 MG 24 hr tablet Take 0.5 tablets (12.5 mg total) by mouth daily. 45 tablet 3   nitroGLYCERIN  (NITROSTAT ) 0.4 MG SL tablet Place 1 tablet (0.4 mg total)  under the tongue every 5 (five) minutes x 3 doses as needed for chest pain. 25 tablet 12   oxymetazoline (AFRIN) 0.05 % nasal spray Place 1 spray into both nostrils 2 (two) times daily as needed for congestion.     rosuvastatin  (CRESTOR ) 20 MG tablet TAKE 1 TABLET BY MOUTH EVERYDAY AT BEDTIME 90 tablet 2   Simethicone  (GAS-X PO) Take 1 tablet by mouth 3 (three) times daily as needed (heartburn).     albuterol (VENTOLIN HFA) 108 (90 Base) MCG/ACT inhaler      No current facility-administered medications for this visit.     Physical Exam    VS:  BP 120/60 (BP Location: Left Arm, Patient Position: Sitting, Cuff Size: Normal)   Pulse 61   Ht 6' 3 (1.905 m)   Wt 190 lb (86.2 kg)   SpO2 99%   BMI 23.75 kg/m  , BMI Body mass index is 23.75 kg/m.          GEN: Well nourished, well developed, in no acute distress. HEENT: normal. Neck: Supple, no JVD, carotid bruits, or masses. Cardiac: RRR, no murmurs, rubs, or gallops. No clubbing, cyanosis, edema.  Radials 2+/PT 1+ and equal bilaterally.   Respiratory:  Respirations regular and unlabored, clear to auscultation bilaterally. GI: Soft, nontender, nondistended, BS + x 4. MS: no deformity or atrophy. Skin: warm and dry, no rash. Neuro:  Strength and sensation are intact. Psych: Normal affect.  Accessory Clinical Findings    ECG personally reviewed by me today - EKG Interpretation Date/Time:  Wednesday August 02 2024 13:59:33 EST Ventricular Rate:  61 PR Interval:  136 QRS Duration:  80 QT Interval:  418 QTC Calculation: 420 R Axis:   -4  Text Interpretation: Sinus bradycardia with Premature atrial complexes Confirmed by Vivienne Bruckner [phone]) on 08/02/2024 2:06:28 PM  - no acute changes.  Lab Results  Component Value Date   WBC 11.5 (H) 11/05/2021   HGB 13.7 11/05/2021   HCT 42.4 11/05/2021   MCV 94.2 11/05/2021   PLT 215 11/05/2021   Lab Results  Component Value Date   CREATININE 1.13 11/05/2021   BUN 12 11/05/2021   NA 140 11/05/2021   K 3.6 11/05/2021   CL 106 11/05/2021   CO2 28 11/05/2021   Lab Results  Component Value Date   ALT 15 03/08/2023   AST 17 03/08/2023   ALKPHOS 55 03/08/2023   BILITOT 0.4 03/08/2023   Lab Results  Component Value Date   CHOL 103 09/13/2023   HDL 36 (L) 09/13/2023   LDLCALC 56 09/13/2023   TRIG 54 09/13/2023   CHOLHDL 2.9 09/13/2023    Lab Results  Component Value Date   HGBA1C 6.0 (H) 03/12/2019       Assessment & Plan    1.  CAD/Unstable Angina: History of anterior STEMI and LAD stenting in August 2020.  Stress testing in 2024 showed no evidence of ischemia or infarct.  Echo at that time showed normal LV function without regional wall motion abnormalities.  Over the past 6 months, he has been having episodic exertional chest pain several times a month, lasting a few minutes, resolving spontaneously.  He does not have any symptoms at rest.  We discussed options for evaluation and mutually agreed on pursuing a Lexiscan  Myoview  to rule out ischemia.  Remains  on beta-blocker, ARB, and statin therapy.  No aspirin  in the setting of chronic Eliquis. Informed Consent   Shared Decision Making/Informed Consent The risks [chest pain, shortness  of breath, cardiac arrhythmias, dizziness, blood pressure fluctuations, myocardial infarction, stroke/transient ischemic attack, nausea, vomiting, allergic reaction, radiation exposure, metallic taste sensation and life-threatening complications (estimated to be 1 in 10,000)], benefits (risk stratification, diagnosing coronary artery disease, treatment guidance) and alternatives of a nuclear stress test were discussed in detail with Brian Mcclure and he agrees to proceed.     2.  Chronic heart failure with improved ejection fraction: EF previously as low as 35 to 45% at the time of his anterior MI in 2020.  Most recent echo in April 2024 showed improvement to 55-60% with normal RV function and mild MR.  He has been doing well without dyspnea and is euvolemic on examination.  Continue beta-blocker and ARB therapy.  3.  Peripheral vascular disease: Status post prior iliac stenting.  Has but he describes stable claudication.  He remains on statin therapy.  No aspirin  in setting of Eliquis.  He is overdue for lower extremity imaging and we will arrange for ABIs and duplex.  4.  Paroxysmal atrial fibrillation/sinus bradycardia: ECG today with a heart rate of 61.  He thinks he may have had 1 episode of A-fib over the past year which lasted a few hours and resolved spontaneously.  Overall well-controlled on beta-blocker and Eliquis therapy.  Has been sometime since he has had follow-up labs and we will obtain a CBC and basic metabolic panel today.  5.  Hyperlipidemia: LDL of 56 last February.  He is not fasting today.  Could continue rosuvastatin  and would look to follow-up labs in the future.  6.  Tobacco abuse: Continues to smoke 1 pack/day.  He admits that both he and his wife need to quit.  We discussed strategies to begin moving  in that direction though at this time he is only in the precontemplation stage.  He again declined Chantix and said that nicotine gum/patch was not previously helpful.  7.  Disposition: Follow-up CBC and basic metabolic panel today.  Follow-up Lexiscan  Myoview .  Follow-up aortoiliac duplex and ABIs.  Follow-up in clinic in 6 weeks or sooner if necessary.  Lonni Meager, NP 08/02/2024, 5:45 PM     [1]  Allergies Allergen Reactions   Statins Other (See Comments)    Leg Cramps   Amoxicillin-Pot Clavulanate Diarrhea   Atorvastatin Other (See Comments)   Atorvastatin Calcium  Other (See Comments)   Clavulanic Acid Diarrhea   Codeine Other (See Comments)   Menthol     Other Reaction(s): Congestion Nose   Nicotine Polacrilex    Pravastatin Other (See Comments)   Bupropion Other (See Comments)    Caused somnolence (and didn't help to stop smoking)   Flunisolide Other (See Comments)    didn't work  Other Reaction(s): Nasal congestion   Nicotine Other (See Comments)    didn't help stop smoking   "

## 2024-08-02 NOTE — Patient Instructions (Signed)
 Medication Instructions:  Your physician recommends that you continue on your current medications as directed. Please refer to the Current Medication list given to you today.   *If you need a refill on your cardiac medications before your next appointment, please call your pharmacy*  Lab Work: Your provider would like for you to have following labs drawn today CBC and BMP.   If you have labs (blood work) drawn today and your tests are completely normal, you will receive your results only by: MyChart Message (if you have MyChart) OR A paper copy in the mail If you have any lab test that is abnormal or we need to change your treatment, we will call you to review the results.  Testing/Procedures: Your provider has ordered a Lexiscan / Exercise Myoview  Stress test. This will take place at [facility]. Please report to the [facility] medical mall entrance. The volunteers at the first desk will direct you where to go.  [facility] MYOVIEW   Your provider has ordered a Stress Test with nuclear imaging. The purpose of this test is to evaluate the blood supply to your heart muscle. This procedure is referred to as a Non-Invasive Stress Test. This is because other than having an IV started in your vein, nothing is inserted or invades your body. Cardiac stress tests are done to find areas of poor blood flow to the heart by determining the extent of coronary artery disease (CAD). Some patients exercise on a treadmill, which naturally increases the blood flow to your heart, while others who are unable to walk on a treadmill due to physical limitations will have a pharmacologic/chemical stress agent called Lexiscan  . This medicine will mimic walking on a treadmill by temporarily increasing your coronary blood flow.   Please note: these test may take anywhere between 2-4 hours to complete  How to prepare for your Myoview  test:  Nothing to eat for 6 hours prior to the test No caffeine for 24 hours prior to test No smoking 24  hours prior to test. Your medication may be taken with water.  If your doctor stopped a medication because of this test, do not take that medication. Ladies, please do not wear dresses.  Skirts or pants are appropriate. Please wear a short sleeve shirt. No perfume, cologne or lotion. Wear comfortable walking shoes. No heels!   PLEASE NOTIFY THE OFFICE AT LEAST 24 HOURS IN ADVANCE IF YOU ARE UNABLE TO KEEP YOUR APPOINTMENT.  [phone] AND  PLEASE NOTIFY NUCLEAR MEDICINE AT [facility] AT LEAST 24 HOURS IN ADVANCE IF YOU ARE UNABLE TO KEEP YOUR APPOINTMENT. [phone]   Your physician has requested that you have a lower extremity arterial duplex. During this test, ultrasound is used to evaluate arterial blood flow in the legs. Allow one hour for this exam. There are no restrictions or special instructions. This will take place at [address] [address] [facility] [address], [place] [address]   Your physician has recommended that you have an AORTA/ILIAC Duplex. This is a noninvasive diagnostic test that uses ultrasound technology to look at the aorta and iliac arteries to detect any restriction to blood flow to the buttocks, groin, legs or feet.  No food after 11PM the night before.  Water is OK. (Don't drink liquids if you have been instructed not to for ANOTHER test). Avoid foods that produce bowel gas, for 24 hours prior to exam (see below). No breakfast, no chewing gum, no smoking or carbonated beverages. Patient may take morning medications with water. Come in  for test at least 15 minutes early to register. This will take approximately 60 minutes This will take place at [address] [facility] [address]) [address], [place] [address]  Please note: We ask at that you not bring children with you during ultrasound (echo/ vascular) testing. Due to room size and safety concerns, children are not allowed in the ultrasound rooms during exams. Our front office staff cannot provide observation  of children in our lobby area while testing is being conducted. An adult accompanying a patient to their appointment will only be allowed in the ultrasound room at the discretion of the ultrasound technician under special circumstances. We apologize for any inconvenience.   Follow-Up: At [facility], you and your health needs are our priority.  As part of our continuing mission to provide you with exceptional heart care, our providers are all part of one team.  This team includes your primary Cardiologist (physician) and Advanced Practice Providers or APPs (Physician Assistants and Nurse Practitioners) who all work together to provide you with the care you need, when you need it.  Your next appointment:   6 week(s)  Provider:   Lonni Hanson, MD or Lonni Meager, NP    We recommend signing up for the patient portal called MyChart.  Sign up information is provided on this After Visit Summary.  MyChart is used to connect with patients for Virtual Visits (Telemedicine).  Patients are able to view lab/test results, encounter notes, upcoming appointments, etc.  Non-urgent messages can be sent to your provider as well.   To learn more about what you can do with MyChart, go to [email or web address].

## 2024-08-03 ENCOUNTER — Ambulatory Visit: Payer: Self-pay | Admitting: Nurse Practitioner

## 2024-08-03 LAB — CBC
Hematocrit: 44.7 % (ref 37.5–51.0)
Hemoglobin: 14.5 g/dL (ref 13.0–17.7)
MCH: 30.8 pg (ref 26.6–33.0)
MCHC: 32.4 g/dL (ref 31.5–35.7)
MCV: 95 fL (ref 79–97)
Platelets: 205 x10E3/uL (ref 150–450)
RBC: 4.71 x10E6/uL (ref 4.14–5.80)
RDW: 12.8 % (ref 11.6–15.4)
WBC: 9.5 x10E3/uL (ref 3.4–10.8)

## 2024-08-03 LAB — BASIC METABOLIC PANEL WITH GFR
BUN/Creatinine Ratio: 15 (ref 10–24)
BUN: 15 mg/dL (ref 8–27)
CO2: 21 mmol/L (ref 20–29)
Calcium: 9.3 mg/dL (ref 8.6–10.2)
Chloride: 105 mmol/L (ref 96–106)
Creatinine, Ser: 1.01 mg/dL (ref 0.76–1.27)
Glucose: 87 mg/dL (ref 70–99)
Potassium: 4.4 mmol/L (ref 3.5–5.2)
Sodium: 144 mmol/L (ref 134–144)
eGFR: 81 mL/min/1.73

## 2024-08-10 ENCOUNTER — Ambulatory Visit
Admission: RE | Admit: 2024-08-10 | Discharge: 2024-08-10 | Disposition: A | Discharge status: Home / Self Care | Source: Ambulatory Visit | Attending: Nurse Practitioner | Admitting: Nurse Practitioner

## 2024-08-10 DIAGNOSIS — I200 Unstable angina: Secondary | ICD-10-CM | POA: Insufficient documentation

## 2024-08-10 LAB — NM MYOCAR MULTI W/SPECT W/WALL MOTION / EF
LV dias vol: 82 mL (ref 62–150)
LV sys vol: 40 mL
Nuc Stress EF: 51 %
Peak HR: 82 {beats}/min
Percent HR: 53 %
Rest HR: 43 {beats}/min
Rest Nuclear Isotope Dose: 10.6 mCi
SDS: 0
SRS: 2
SSS: 3
ST Depression (mm): 0 mm
Stress Nuclear Isotope Dose: 32.3 mCi
TID: 1.09

## 2024-08-10 MED ORDER — TECHNETIUM TC 99M TETROFOSMIN IV KIT
10.5800 | PACK | Freq: Once | INTRAVENOUS | Status: AC | PRN
  Administered 2024-08-10: 10.58 via INTRAVENOUS

## 2024-08-10 MED ORDER — REGADENOSON 0.4 MG/5ML IV SOLN
0.4000 mg | Freq: Once | INTRAVENOUS | Status: AC
  Administered 2024-08-10: 0.4 mg via INTRAVENOUS

## 2024-08-10 MED ORDER — TECHNETIUM TC 99M TETROFOSMIN IV KIT
32.2600 | PACK | Freq: Once | INTRAVENOUS | Status: AC | PRN
  Administered 2024-08-10: 32.26 via INTRAVENOUS

## 2024-08-24 ENCOUNTER — Other Ambulatory Visit: Payer: Self-pay

## 2024-08-24 ENCOUNTER — Ambulatory Visit

## 2024-08-24 ENCOUNTER — Ambulatory Visit (INDEPENDENT_AMBULATORY_CARE_PROVIDER_SITE_OTHER)

## 2024-08-24 DIAGNOSIS — Z79899 Other long term (current) drug therapy: Secondary | ICD-10-CM

## 2024-08-24 DIAGNOSIS — M79651 Pain in right thigh: Secondary | ICD-10-CM

## 2024-08-24 DIAGNOSIS — I739 Peripheral vascular disease, unspecified: Secondary | ICD-10-CM | POA: Diagnosis not present

## 2024-08-24 DIAGNOSIS — M79652 Pain in left thigh: Secondary | ICD-10-CM | POA: Diagnosis not present

## 2024-08-24 LAB — VAS US ABI WITH/WO TBI
Left ABI: 1.13
Right ABI: 1.13

## 2024-08-25 ENCOUNTER — Ambulatory Visit: Payer: Self-pay | Admitting: Nurse Practitioner

## 2024-08-25 LAB — LIPID PANEL
Chol/HDL Ratio: 2.7 ratio (ref 0.0–5.0)
Cholesterol, Total: 96 mg/dL — ABNORMAL LOW (ref 100–199)
HDL: 35 mg/dL — ABNORMAL LOW
LDL Chol Calc (NIH): 45 mg/dL (ref 0–99)
Triglycerides: 78 mg/dL (ref 0–149)
VLDL Cholesterol Cal: 16 mg/dL (ref 5–40)

## 2024-09-22 ENCOUNTER — Ambulatory Visit: Admitting: Nurse Practitioner
